# Patient Record
Sex: Male | Born: 1960
Health system: Southern US, Community
[De-identification: ages and names within clinical notes are randomized; demographics above are authoritative.]

## PROBLEM LIST (undated history)

## (undated) DIAGNOSIS — I509 Heart failure, unspecified: Secondary | ICD-10-CM

## (undated) DIAGNOSIS — J449 Chronic obstructive pulmonary disease, unspecified: Secondary | ICD-10-CM

## (undated) DIAGNOSIS — I499 Cardiac arrhythmia, unspecified: Secondary | ICD-10-CM

## (undated) DIAGNOSIS — I219 Acute myocardial infarction, unspecified: Secondary | ICD-10-CM

## (undated) DIAGNOSIS — F419 Anxiety disorder, unspecified: Secondary | ICD-10-CM

## (undated) DIAGNOSIS — E119 Type 2 diabetes mellitus without complications: Secondary | ICD-10-CM

## (undated) DIAGNOSIS — G4730 Sleep apnea, unspecified: Secondary | ICD-10-CM

## (undated) DIAGNOSIS — I639 Cerebral infarction, unspecified: Secondary | ICD-10-CM

## (undated) DIAGNOSIS — I4891 Unspecified atrial fibrillation: Secondary | ICD-10-CM

## (undated) DIAGNOSIS — I10 Essential (primary) hypertension: Secondary | ICD-10-CM

## (undated) DIAGNOSIS — N189 Chronic kidney disease, unspecified: Secondary | ICD-10-CM

## (undated) DIAGNOSIS — M1990 Unspecified osteoarthritis, unspecified site: Secondary | ICD-10-CM

## (undated) HISTORY — DX: Unspecified atrial fibrillation: I48.91

## (undated) HISTORY — PX: ABLATION: SHX5711

## (undated) HISTORY — DX: Acute myocardial infarction, unspecified: I21.9

## (undated) HISTORY — DX: Chronic kidney disease, unspecified: N18.9

## (undated) HISTORY — DX: Anxiety disorder, unspecified: F41.9

## (undated) HISTORY — DX: Type 2 diabetes mellitus without complications: E11.9

## (undated) HISTORY — PX: EYE SURGERY: SHX253

## (undated) HISTORY — DX: Unspecified osteoarthritis, unspecified site: M19.90

---

## 1998-04-30 ENCOUNTER — Emergency Department (HOSPITAL_COMMUNITY): Admission: EM | Admit: 1998-04-30 | Discharge: 1998-04-30 | Payer: Self-pay | Admitting: Emergency Medicine

## 2008-08-10 ENCOUNTER — Emergency Department (HOSPITAL_COMMUNITY): Admission: EM | Admit: 2008-08-10 | Discharge: 2008-08-10 | Payer: Self-pay | Admitting: Emergency Medicine

## 2008-09-13 ENCOUNTER — Emergency Department (HOSPITAL_COMMUNITY): Admission: EM | Admit: 2008-09-13 | Discharge: 2008-09-14 | Payer: Self-pay | Admitting: Emergency Medicine

## 2009-12-28 IMAGING — CT CT NECK W/ CM
3 series · 13 of 20 positions shown, 14 images · IV contrast (omnipaque)
Comparison: None

CLINICAL DATA: Abscess, facial swelling

CT NECK WITH CONTRAST
TECHNIQUE: Multidetector CT imaging of the neck was performed with
intravenous contrast.
Contrast: 80 ml Omnipaque 300 base the brain appears normal.
Orbits appear normal.

[Series 2: 2cc/(id)/1cc/(id) · axial · 0.52mm/px · z∈[-231,-108]mm · 3 of 67 slices shown, 4 images]
[im 17/67  soft-tissue]
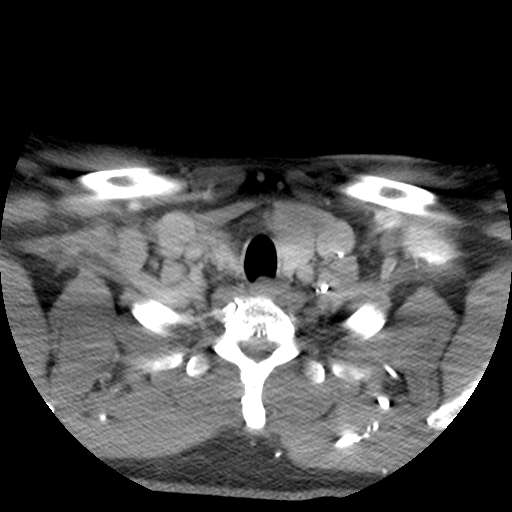
[im 17/67  bone]
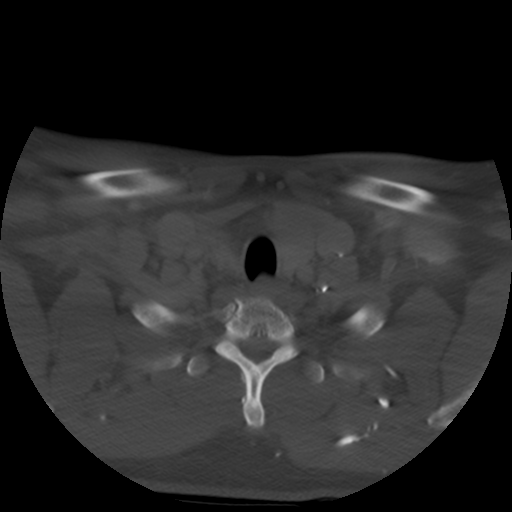
[im 34/67  bone]
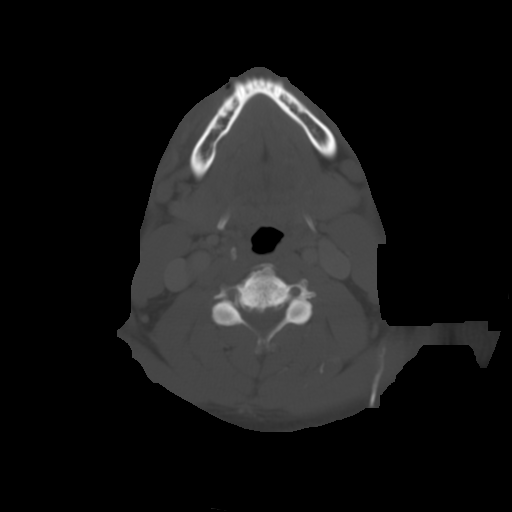
[im 50/67  bone]
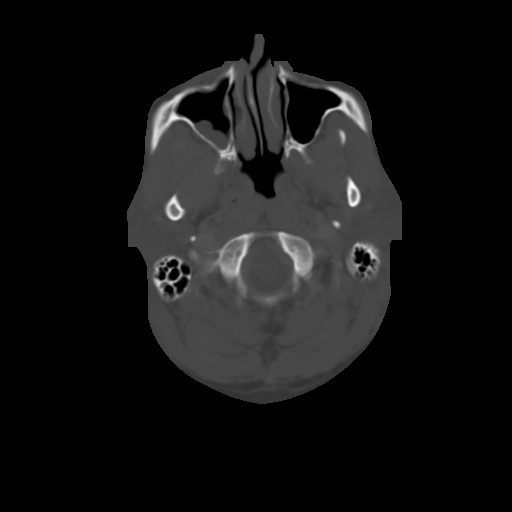

[Series 300: reformatted · sagittal · 0.52mm/px · 7 of 126 slices shown (1 of 2)]
[im 16/126  bone]
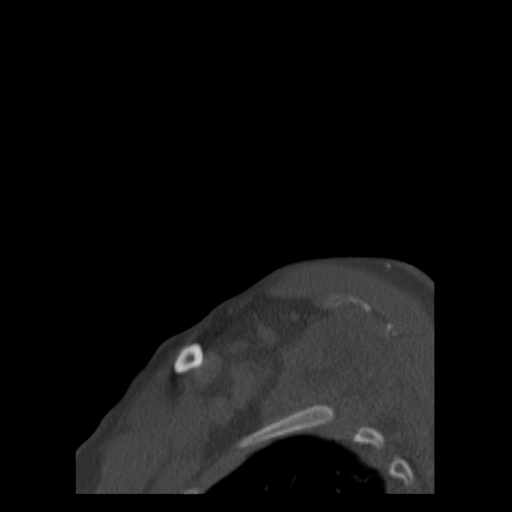
[im 32/126  bone]
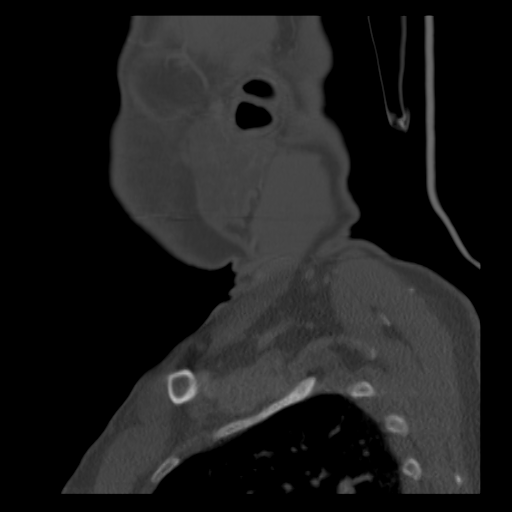
[im 47/126  bone]
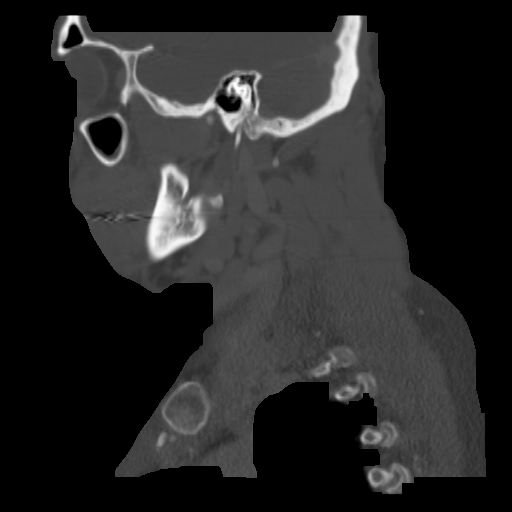
[im 63/126  bone]
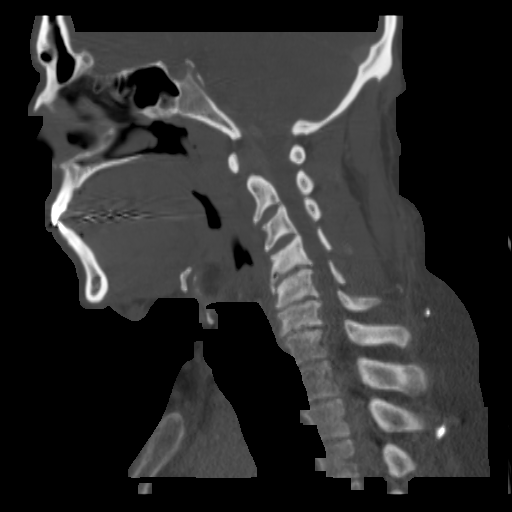
[im 79/126  bone]
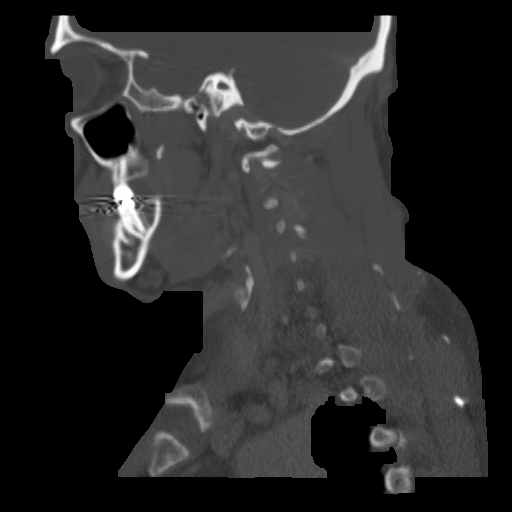
[im 94/126  bone]
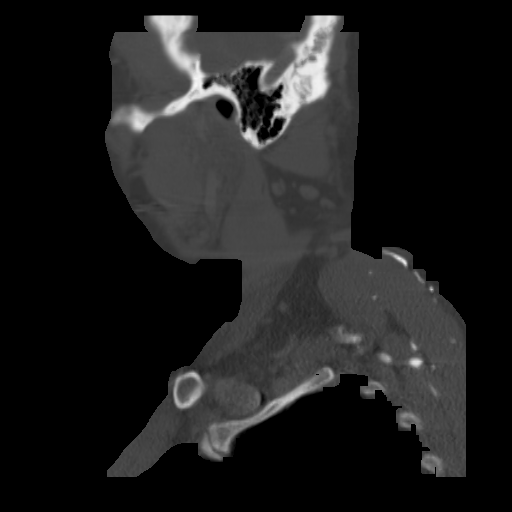
[im 110/126  bone]
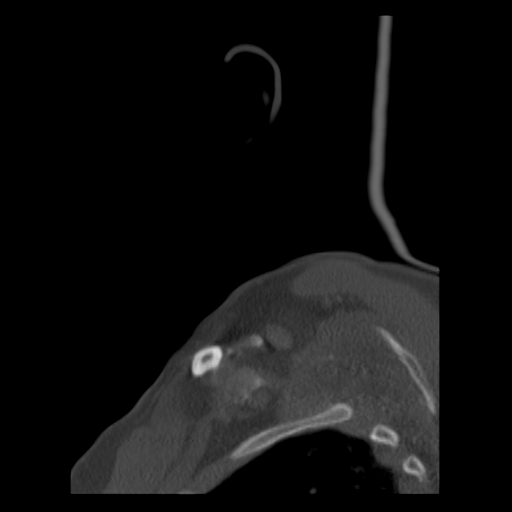

[Series 301: reformatted · coronal · 0.52mm/px · 3 of 128 slices shown (2 of 2)]
[im 26/128  bone]
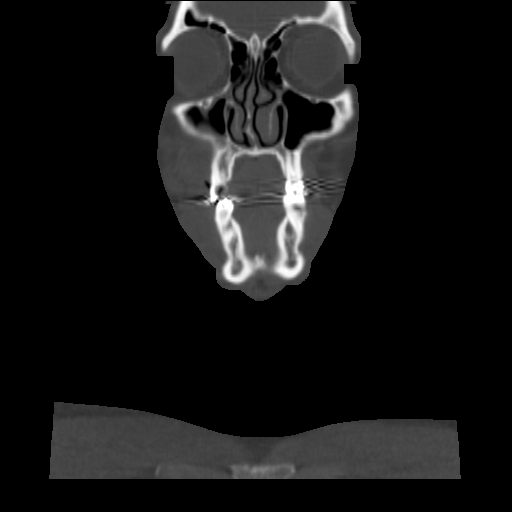
[im 51/128  bone]
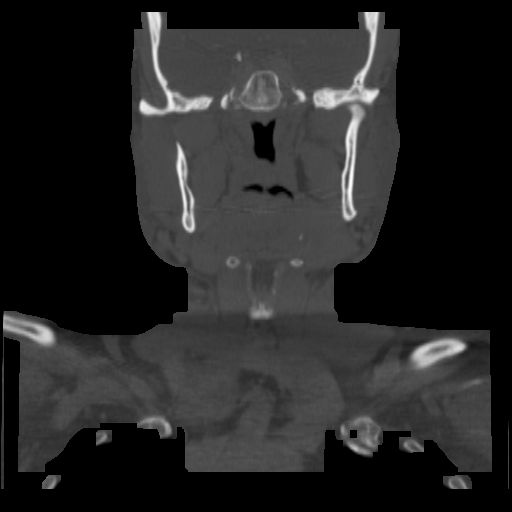
[im 77/128  bone]
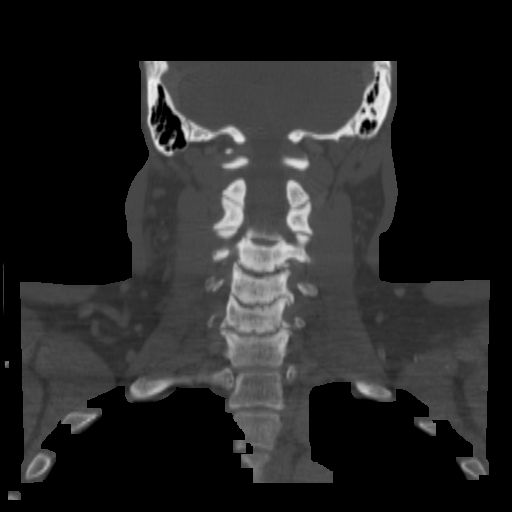

[13 of 20 positions shown; findings below may reference images not displayed]

FINDINGS: There are peri apical lucencies involving upper right 3rd
tooth and 4th tooth ( image 29).  The second tooth  is missing.
There is mucosal thickening in the floor of the right maxillary
sinus in the same vicinity.  There is a small inflammatory
reaction/fluid collection just anterior to this maxillary process
within the soft tissues of the right cheek (image 24).  The orbits
appear normal.  The pharyngeal mucosa appears normal and symmetric.
The retropharyngeal soft tissues appear normal.  No significant
lymphadenopathy.  There are mildly enlarged submental nodes noted.
IMPRESSION: 1.  Peri apical abscess involving the right 3rd tooth and
potentially 4th tooth
2.  Small soft tissue abscess  versus phlegmon within the soft
tissues anterior to the right maxillary bone.

## 2010-05-03 LAB — POCT I-STAT, CHEM 8
Calcium, Ion: 1.07 mmol/L — ABNORMAL LOW (ref 1.12–1.32)
Glucose, Bld: 100 mg/dL — ABNORMAL HIGH (ref 70–99)
HCT: 45 % (ref 39.0–52.0)
Hemoglobin: 15.3 g/dL (ref 13.0–17.0)

## 2010-05-04 LAB — WOUND CULTURE
Culture: NO GROWTH
Gram Stain: NONE SEEN

## 2010-05-04 LAB — GLUCOSE, CAPILLARY: Glucose-Capillary: 88 mg/dL (ref 70–99)

## 2012-10-20 ENCOUNTER — Encounter (HOSPITAL_COMMUNITY): Admission: EM | Disposition: A | Discharge status: Home / Self Care | Payer: Self-pay | Source: Home / Self Care

## 2012-10-20 ENCOUNTER — Emergency Department (HOSPITAL_COMMUNITY): Payer: Self-pay | Admitting: Anesthesiology

## 2012-10-20 ENCOUNTER — Emergency Department (HOSPITAL_COMMUNITY): Payer: Self-pay

## 2012-10-20 ENCOUNTER — Encounter (HOSPITAL_COMMUNITY): Payer: Self-pay | Admitting: Emergency Medicine

## 2012-10-20 ENCOUNTER — Inpatient Hospital Stay (HOSPITAL_COMMUNITY)
Admission: EM | Admit: 2012-10-20 | Discharge: 2012-10-22 | DRG: 983 | Disposition: A | Discharge status: Home / Self Care | Payer: Self-pay | Attending: General Surgery | Admitting: General Surgery

## 2012-10-20 ENCOUNTER — Encounter (HOSPITAL_COMMUNITY): Payer: Self-pay | Admitting: Anesthesiology

## 2012-10-20 DIAGNOSIS — K611 Rectal abscess: Secondary | ICD-10-CM | POA: Diagnosis present

## 2012-10-20 DIAGNOSIS — K612 Anorectal abscess: Secondary | ICD-10-CM

## 2012-10-20 DIAGNOSIS — F17200 Nicotine dependence, unspecified, uncomplicated: Secondary | ICD-10-CM | POA: Diagnosis present

## 2012-10-20 DIAGNOSIS — L0390 Cellulitis, unspecified: Secondary | ICD-10-CM

## 2012-10-20 HISTORY — PX: INCISION AND DRAINAGE PERIRECTAL ABSCESS: SHX1804

## 2012-10-20 LAB — CBC WITH DIFFERENTIAL/PLATELET
Basophils Relative: 0 % (ref 0–1)
HCT: 44.5 % (ref 39.0–52.0)
Hemoglobin: 15.7 g/dL (ref 13.0–17.0)
Lymphocytes Relative: 9 % — ABNORMAL LOW (ref 12–46)
Lymphs Abs: 1.3 10*3/uL (ref 0.7–4.0)
Monocytes Relative: 9 % (ref 3–12)
Neutro Abs: 10.9 10*3/uL — ABNORMAL HIGH (ref 1.7–7.7)
Neutrophils Relative %: 81 % — ABNORMAL HIGH (ref 43–77)
RBC: 5.27 MIL/uL (ref 4.22–5.81)
WBC: 13.6 10*3/uL — ABNORMAL HIGH (ref 4.0–10.5)

## 2012-10-20 LAB — URINALYSIS, ROUTINE W REFLEX MICROSCOPIC
Glucose, UA: NEGATIVE mg/dL
Nitrite: NEGATIVE
Specific Gravity, Urine: 1.016 (ref 1.005–1.030)
pH: 6 (ref 5.0–8.0)

## 2012-10-20 LAB — URINE MICROSCOPIC-ADD ON

## 2012-10-20 LAB — BASIC METABOLIC PANEL
BUN: 6 mg/dL (ref 6–23)
CO2: 26 mEq/L (ref 19–32)
Chloride: 97 mEq/L (ref 96–112)
GFR calc Af Amer: >90 mL/min (ref >90)
Potassium: 3.7 mEq/L (ref 3.5–5.1)

## 2012-10-20 SURGERY — INCISION AND DRAINAGE, ABSCESS, PERIRECTAL
Anesthesia: General | Site: Buttocks | Wound class: Dirty or Infected

## 2012-10-20 MED ORDER — IOHEXOL 300 MG/ML  SOLN
25.0000 mL | INTRAMUSCULAR | Status: AC
  Administered 2012-10-20 (×2): 25 mL via ORAL

## 2012-10-20 MED ORDER — HYDROMORPHONE HCL PF 1 MG/ML IJ SOLN
1.0000 mg | INTRAMUSCULAR | Status: DC | PRN
  Administered 2012-10-21: 1 mg via INTRAVENOUS
  Filled 2012-10-20: qty 1

## 2012-10-20 MED ORDER — FENTANYL CITRATE 0.05 MG/ML IJ SOLN
INTRAMUSCULAR | Status: DC | PRN
  Administered 2012-10-20 (×2): 100 ug via INTRAVENOUS
  Administered 2012-10-20: 50 ug via INTRAVENOUS

## 2012-10-20 MED ORDER — ONDANSETRON HCL 4 MG/2ML IJ SOLN
4.0000 mg | Freq: Once | INTRAMUSCULAR | Status: AC
  Administered 2012-10-20: 4 mg via INTRAVENOUS
  Filled 2012-10-20: qty 2

## 2012-10-20 MED ORDER — LACTATED RINGERS IV SOLN
INTRAVENOUS | Status: DC | PRN
  Administered 2012-10-20: 21:00:00 via INTRAVENOUS

## 2012-10-20 MED ORDER — OXYCODONE-ACETAMINOPHEN 5-325 MG PO TABS
1.0000 | ORAL_TABLET | ORAL | Status: DC | PRN
  Administered 2012-10-21 – 2012-10-22 (×5): 2 via ORAL
  Filled 2012-10-20 (×5): qty 2
  Filled 2012-10-20: qty 1

## 2012-10-20 MED ORDER — PROPOFOL 10 MG/ML IV BOLUS
INTRAVENOUS | Status: DC | PRN
  Administered 2012-10-20: 200 mg via INTRAVENOUS

## 2012-10-20 MED ORDER — SUCCINYLCHOLINE CHLORIDE 20 MG/ML IJ SOLN
INTRAMUSCULAR | Status: DC | PRN
  Administered 2012-10-20: 180 mg via INTRAVENOUS

## 2012-10-20 MED ORDER — POTASSIUM CHLORIDE IN NACL 20-0.9 MEQ/L-% IV SOLN
INTRAVENOUS | Status: DC
  Administered 2012-10-21: via INTRAVENOUS
  Filled 2012-10-20 (×3): qty 1000

## 2012-10-20 MED ORDER — CLINDAMYCIN PHOSPHATE 900 MG/50ML IV SOLN
900.0000 mg | Freq: Once | INTRAVENOUS | Status: AC
  Administered 2012-10-20: 900 mg via INTRAVENOUS
  Filled 2012-10-20: qty 50

## 2012-10-20 MED ORDER — ONDANSETRON HCL 4 MG/2ML IJ SOLN: 4.0000 mg | Freq: Four times a day (QID) | INTRAMUSCULAR | Status: DC | PRN

## 2012-10-20 MED ORDER — 0.9 % SODIUM CHLORIDE (POUR BTL) OPTIME
TOPICAL | Status: DC | PRN
  Administered 2012-10-20: 1000 mL

## 2012-10-20 MED ORDER — OXYCODONE HCL 5 MG PO TABS: 5.0000 mg | ORAL_TABLET | Freq: Once | ORAL | Status: DC | PRN

## 2012-10-20 MED ORDER — IOHEXOL 300 MG/ML  SOLN
80.0000 mL | Freq: Once | INTRAMUSCULAR | Status: AC | PRN
  Administered 2012-10-20: 80 mL via INTRAVENOUS

## 2012-10-20 MED ORDER — MIDAZOLAM HCL 5 MG/5ML IJ SOLN
INTRAMUSCULAR | Status: DC | PRN
  Administered 2012-10-20: 2 mg via INTRAVENOUS

## 2012-10-20 MED ORDER — SODIUM CHLORIDE 0.9 % IV SOLN
3.0000 g | Freq: Four times a day (QID) | INTRAVENOUS | Status: DC
  Administered 2012-10-21 – 2012-10-22 (×5): 3 g via INTRAVENOUS
  Filled 2012-10-20 (×7): qty 3

## 2012-10-20 MED ORDER — HYDROMORPHONE HCL PF 1 MG/ML IJ SOLN
INTRAMUSCULAR | Status: AC
  Filled 2012-10-20: qty 1

## 2012-10-20 MED ORDER — OXYCODONE-ACETAMINOPHEN 5-325 MG PO TABS
ORAL_TABLET | ORAL | Status: AC
  Filled 2012-10-20: qty 2

## 2012-10-20 MED ORDER — OXYCODONE HCL 5 MG/5ML PO SOLN: 5.0000 mg | Freq: Once | ORAL | Status: DC | PRN

## 2012-10-20 MED ORDER — ONDANSETRON HCL 4 MG PO TABS: 4.0000 mg | ORAL_TABLET | Freq: Four times a day (QID) | ORAL | Status: DC | PRN

## 2012-10-20 MED ORDER — PROMETHAZINE HCL 25 MG/ML IJ SOLN: 6.2500 mg | INTRAMUSCULAR | Status: DC | PRN

## 2012-10-20 MED ORDER — HYDROMORPHONE HCL PF 1 MG/ML IJ SOLN
0.2500 mg | INTRAMUSCULAR | Status: DC | PRN
  Administered 2012-10-20 (×2): 0.5 mg via INTRAVENOUS

## 2012-10-20 MED ORDER — LIDOCAINE HCL (CARDIAC) 20 MG/ML IV SOLN
INTRAVENOUS | Status: DC | PRN
  Administered 2012-10-20: 100 mg via INTRAVENOUS

## 2012-10-20 MED ORDER — MORPHINE SULFATE 4 MG/ML IJ SOLN
4.0000 mg | Freq: Once | INTRAMUSCULAR | Status: AC
  Administered 2012-10-20: 4 mg via INTRAVENOUS
  Filled 2012-10-20: qty 1

## 2012-10-20 MED ORDER — ENOXAPARIN SODIUM 40 MG/0.4ML ~~LOC~~ SOLN
40.0000 mg | SUBCUTANEOUS | Status: DC
  Administered 2012-10-21: 40 mg via SUBCUTANEOUS
  Filled 2012-10-20 (×2): qty 0.4

## 2012-10-20 SURGICAL SUPPLY — 31 items
BANDAGE GAUZE ELAST BULKY 4 IN (GAUZE/BANDAGES/DRESSINGS) IMPLANT
BLADE SURG 10 STRL SS (BLADE) ×2 IMPLANT
BLADE SURG 15 STRL LF DISP TIS (BLADE) ×1 IMPLANT
BLADE SURG 15 STRL SS (BLADE) ×2
CLOTH BEACON ORANGE TIMEOUT ST (SAFETY) ×2 IMPLANT
COVER MAYO STAND STRL (DRAPES) ×2 IMPLANT
COVER SURGICAL LIGHT HANDLE (MISCELLANEOUS) ×2 IMPLANT
DRAPE UTILITY 15X26 W/TAPE STR (DRAPE) ×4 IMPLANT
DRSG PAD ABDOMINAL 8X10 ST (GAUZE/BANDAGES/DRESSINGS) ×1 IMPLANT
ELECT CAUTERY BLADE 6.4 (BLADE) ×2 IMPLANT
ELECT REM PT RETURN 9FT ADLT (ELECTROSURGICAL) ×2
ELECTRODE REM PT RTRN 9FT ADLT (ELECTROSURGICAL) ×1 IMPLANT
GLOVE BIO SURGEON STRL SZ7 (GLOVE) ×2 IMPLANT
GLOVE BIOGEL PI IND STRL 7.5 (GLOVE) ×1 IMPLANT
GLOVE BIOGEL PI INDICATOR 7.5 (GLOVE) ×1
GOWN STRL NON-REIN LRG LVL3 (GOWN DISPOSABLE) ×4 IMPLANT
KIT BASIN OR (CUSTOM PROCEDURE TRAY) ×2 IMPLANT
KIT ROOM TURNOVER OR (KITS) ×2 IMPLANT
NS IRRIG 1000ML POUR BTL (IV SOLUTION) ×2 IMPLANT
PACK LITHOTOMY IV (CUSTOM PROCEDURE TRAY) ×2 IMPLANT
PAD ARMBOARD 7.5X6 YLW CONV (MISCELLANEOUS) ×4 IMPLANT
PENCIL BUTTON HOLSTER BLD 10FT (ELECTRODE) ×2 IMPLANT
SPONGE GAUZE 4X4 12PLY (GAUZE/BANDAGES/DRESSINGS) ×1 IMPLANT
SPONGE LAP 18X18 X RAY DECT (DISPOSABLE) ×2 IMPLANT
SURGILUBE 2OZ TUBE FLIPTOP (MISCELLANEOUS) IMPLANT
SYR BULB 3OZ (MISCELLANEOUS) ×2 IMPLANT
TOWEL OR 17X24 6PK STRL BLUE (TOWEL DISPOSABLE) ×2 IMPLANT
TOWEL OR 17X26 10 PK STRL BLUE (TOWEL DISPOSABLE) ×2 IMPLANT
TUBE CONNECTING 12X1/4 (SUCTIONS) ×2 IMPLANT
WATER STERILE IRR 1000ML POUR (IV SOLUTION) IMPLANT
YANKAUER SUCT BULB TIP NO VENT (SUCTIONS) ×2 IMPLANT

## 2012-10-20 NOTE — ED Notes (Signed)
Surgery consult at bedside.

## 2012-10-20 NOTE — Op Note (Signed)
Pre-op Diagnosis:  Right ischiorectal abscess Post-op Diagnosis:  Same Procedure:  Incision and debridement of right perirectal abscess - skin, subcutaneous fat, muscle -20 cm2 Surgeon:  Wynona Luna. Anesthesia:  GETT Indications:  52 yo male presents with 5 day history of worsening R buttock pain and swelling.  Found to have large right perirectal abscess with extension toward the base of the scrotum.  Description of procedure:  The patient was brought to the OR and placed in a supine position on the table.  After an adequate level of anesthesia was given, The patient's legs were placed in lithotomy position in yellowfin stirrups. His perineum was prepped with Betadine and draped in sterile fashion. A timeout was taken to ensure the proper patient proper procedure.  Cautery was used to make a round incision in the center of the most fluctuant area. Immediately we encountered some foul-smelling purulent fluid. This was cultured and sent for microbiology. The suction was placed in the abscess cavity and about 75 cc of purulent fluid was expressed. I extended my debridement anteriorly. The abscess seemed to tract towards the base of the scrotum. We excised a total of about 20 cm of skin subcutaneous fat and a little bit of muscle. We thoroughly explored the wound and there did not seem to be any further tunneling. There was no obvious communication to the rectum on digital rectal examination. Pulse lavage was used to irrigated the wound using 2-1/2 L of saline. Hemostasis was obtained with cautery. We then packed the wound with saline moistened gauze. A dry dressing was applied. The patient was then extubated and brought to recovery in stable condition. All sponge, initially, and needle counts are correct.  Wilmon Arms. Corliss Skains, MD, [facility]  General/ Trauma Surgery  10/20/2012 9:38 PM

## 2012-10-20 NOTE — ED Provider Notes (Signed)
CSN: [id number]     Arrival date & time 10/20/12  1124 History   First MD Initiated Contact with Patient 10/20/12 1243     Chief Complaint  Patient presents with  . Rectal Pain  . Abscess   (Consider location/radiation/quality/duration/timing/severity/associated sxs/prior Treatment) HPI Comments: Patient is a 52 year old male with no past medical history who presents with a 5 day history of rectal pain. Symptoms started gradually and progressively worsened. The pain is throbbing and severe with radiation into his scrotum. Patient has not tried anything for symptoms. Patient reports associated redness and swelling. Palpation of the area and sitting makes the pain worse. Nothing makes the pain better. No other associated symptoms.    History reviewed. No pertinent past medical history. History reviewed. No pertinent past surgical history. History reviewed. No pertinent family history. History  Substance Use Topics  . Smoking status: Current Every Day Smoker  . Smokeless tobacco: Not on file  . Alcohol Use: Yes    Review of Systems  Gastrointestinal: Positive for rectal pain.  All other systems reviewed and are negative.    Allergies  Review of patient's allergies indicates no known allergies.  Home Medications  No current outpatient prescriptions on file. BP 151/117  Pulse 109  Temp(Src) 99.3 F (37.4 C) (Oral)  Resp 18  SpO2 97% Physical Exam  Nursing note and vitals reviewed. Constitutional: He is oriented to person, place, and time. He appears well-developed and well-nourished. No distress.  HENT:  Head: Normocephalic and atraumatic.  Eyes: Conjunctivae and EOM are normal.  Neck: Normal range of motion.  Cardiovascular: Normal rate and regular rhythm.  Exam reveals no gallop and no friction rub.   No murmur heard. Pulmonary/Chest: Effort normal and breath sounds normal. He has no wheezes. He has no rales. He exhibits no tenderness.  Abdominal: Soft. He exhibits no  distension. There is no tenderness. There is no rebound and no guarding.  Genitourinary:  Right perirectal and buttock tenderness and induration that extends about 12 cm into the rectum and scrotum. No area of fluctuance noted.   Musculoskeletal: Normal range of motion.  Neurological: He is alert and oriented to person, place, and time. Coordination normal.  Speech is goal-oriented. Moves limbs without ataxia.   Skin: Skin is warm and dry.  Psychiatric: He has a normal mood and affect. His behavior is normal.    ED Course  Procedures (including critical care time) Labs Review Labs Reviewed  CBC WITH DIFFERENTIAL - Abnormal; Notable for the following:    WBC 13.6 (*)    Neutrophils Relative % 81 (*)    Neutro Abs 10.9 (*)    Lymphocytes Relative 9 (*)    Monocytes Absolute 1.2 (*)    All other components within normal limits  BASIC METABOLIC PANEL - Abnormal; Notable for the following:    Sodium 134 (*)    Glucose, Bld 117 (*)    GFR calc non Af Amer 79 (*)    All other components within normal limits  URINALYSIS, ROUTINE W REFLEX MICROSCOPIC - Abnormal; Notable for the following:    Color, Urine AMBER (*)    Hgb urine dipstick TRACE (*)    Bilirubin Urine SMALL (*)    Ketones, ur 15 (*)    Leukocytes, UA TRACE (*)    All other components within normal limits  URINE CULTURE  URINE MICROSCOPIC-ADD ON   Imaging Review Ct Pelvis W Contrast  10/20/2012   CLINICAL DATA:  Perianal/ perineal swelling  with tenderness. Question perirectal abscess.  EXAM: CT PELVIS WITH CONTRAST  TECHNIQUE: Multidetector CT imaging of the pelvis was performed using the standard protocol following the bolus administration of intravenous contrast.  CONTRAST:  80mL OMNIPAQUE IOHEXOL 300 MG/ML  SOLN  COMPARISON:  None.  FINDINGS: There is asymmetric soft tissue swelling throughout the right perianal subcutaneous fat. There is an ill-defined fluid collection measuring approximately 6.0 x 2.5 cm on axial image  63. This is very superficial and demonstrates no internal air or definable fistulous track communicating with the anus. Inflammatory changes are limited to the right side of the anal crease. Within the pelvis, no perirectal inflammatory changes are identified.  There is mild sigmoid colon diverticulosis. Mildly prominent inguinal lymph nodes bilaterally are likely reactive. The urinary bladder and prostate gland appear unremarkable for the degree of inspiration. There is prominent fat within both inguinal canals.  Lower lumbar spine facet degenerative changes are noted. There are no worrisome osseous findings.  IMPRESSION: 1. Findings are consistent with a right perianal superficial abscess with surrounding cellulitis as described. No fistulous tract identified. 2. No intrapelvic inflammatory changes identified. 3. Probable reactive inguinal lymphadenopathy. 4. Bilateral inguinal hernias containing fat.   Electronically Signed   By: Roxy Horseman   On: 10/20/2012 17:20    MDM   1. Perirectal abscess   2. Cellulitis     2:59 PM Labs show elevated WBC. Other labs and urinalysis unremarkable for acute changes. Patient is tachycardic with a low grade temperature. Patient will have CT pelvis with IV contrast to rule out deep abscess.   CT shows superficial abscess, however, on exam I do not see and identifiable are of fluctuance. Dr. Fayrene Fearing saw the patient and agrees. I spoke with Dr. Corliss Skains of general surgery who will see the patient and drain the abscess in the OR. Patient started on IV Clindamycin.    Emilia Beck, PA-C 10/20/12 2009

## 2012-10-20 NOTE — ED Notes (Signed)
Pt informed for consult.

## 2012-10-20 NOTE — Anesthesia Postprocedure Evaluation (Signed)
  Anesthesia Post-op Note  Patient: Donald [name]  Procedure(s) Performed: Procedure(s) with comments: IRRIGATION AND DEBRIDEMENT PERIRECTAL ABSCESS (N/A) - Lithotomy  Patient Location: PACU  Anesthesia Type:General  Level of Consciousness: awake  Airway and Oxygen Therapy: Patient Spontanous Breathing  Post-op Pain: mild  Post-op Assessment: Post-op Vital signs reviewed, Patient's Cardiovascular Status Stable, Respiratory Function Stable, Patent Airway, No signs of Nausea or vomiting and Pain level controlled  Post-op Vital Signs: Reviewed and stable  Complications: No apparent anesthesia complications

## 2012-10-20 NOTE — Transfer of Care (Signed)
Immediate Anesthesia Transfer of Care Note  Patient: Donald [name]  Procedure(s) Performed: Procedure(s) with comments: IRRIGATION AND DEBRIDEMENT PERIRECTAL ABSCESS (N/A) - Lithotomy  Patient Location: PACU  Anesthesia Type:General  Level of Consciousness: awake, alert  and oriented  Airway & Oxygen Therapy: Patient Spontanous Breathing  Post-op Assessment: Report given to PACU RN and Post -op Vital signs reviewed and stable  Post vital signs: Reviewed and stable  Complications: No apparent anesthesia complications

## 2012-10-20 NOTE — ED Provider Notes (Signed)
Medical screening examination/treatment/procedure(s) were conducted as a shared visit with non-physician practitioner(s) and myself.  I personally evaluated the patient during the encounter   Patient seen and examined. Large abscess and area of cellulitis . Extends to the posterior scrotum. No signs of abnormalities the scrotal skin that would suggest fornier's.  Indurated.  Some skin breakdown over area of maximum induration. I have recommended surgical consultation.   Care discussed between PA and Dr. Corliss Skains.  Dr. Corliss Skains here and planning OR debriedment and admit for additional IV antibiotics.   Roney Marion, MD 10/20/12 [phone]

## 2012-10-20 NOTE — H&P (Signed)
Donald [name] is an 52 y.o. male.   Chief Complaint: Right buttock pain and swelling HPI: Healthy 52 yo male presents with a five day history of progressively worsening swelling and tenderness in his right perirectal region.  No fever.  The pain and swelling seem to have progressed and now radiates into the base of his scrotum.  He presented to the ED today around 10:00 AM for evaluation.  We were called to see the patient around 6:45 PM.  History reviewed. No pertinent past medical history.  History reviewed. No pertinent past surgical history. Orbital reconstructive surgery.  History reviewed. No pertinent family history. Social History:  reports that he has been smoking.  He does not have any smokeless tobacco history on file. He reports that  drinks alcohol. He reports that he uses illicit drugs (Cocaine).  Allergies: No Known Allergies  Prior to Admission medications   Medication Sig Start Date End Date Taking? Authorizing Provider  Multiple Vitamin (MULTIVITAMIN WITH MINERALS) TABS tablet Take 1 tablet by mouth daily.   Yes Historical Provider, MD  naproxen sodium (ANAPROX) 220 MG tablet Take 220-440 mg by mouth daily as needed (pain).   Yes Historical Provider, MD  neomycin-polymyxin-pramoxine (NEOSPORIN PLUS) 1 % cream Apply 1 application topically 3 (three) times daily as needed (wound care).   Yes Historical Provider, MD     Results for orders placed during the hospital encounter of 10/20/12 (from the past 48 hour(s))  CBC WITH DIFFERENTIAL     Status: Abnormal   Collection Time    10/20/12  1:02 PM      Result Value Range   WBC 13.6 (*) 4.0 - 10.5 K/uL   RBC 5.27  4.22 - 5.81 MIL/uL   Hemoglobin 15.7  13.0 - 17.0 g/dL   HCT 40.9  81.1 - 91.4 %   MCV 84.4  78.0 - 100.0 fL   MCH 29.8  26.0 - 34.0 pg   MCHC 35.3  30.0 - 36.0 g/dL   RDW 78.2  95.6 - 21.3 %   Platelets 189  150 - 400 K/uL   Neutrophils Relative % 81 (*) 43 - 77 %   Neutro Abs 10.9 (*) 1.7 - 7.7 K/uL   Lymphocytes Relative 9 (*) 12 - 46 %   Lymphs Abs 1.3  0.7 - 4.0 K/uL   Monocytes Relative 9  3 - 12 %   Monocytes Absolute 1.2 (*) 0.1 - 1.0 K/uL   Eosinophils Relative 1  0 - 5 %   Eosinophils Absolute 0.1  0.0 - 0.7 K/uL   Basophils Relative 0  0 - 1 %   Basophils Absolute 0.0  0.0 - 0.1 K/uL  BASIC METABOLIC PANEL     Status: Abnormal   Collection Time    10/20/12  1:02 PM      Result Value Range   Sodium 134 (*) 135 - 145 mEq/L   Potassium 3.7  3.5 - 5.1 mEq/L   Chloride 97  96 - 112 mEq/L   CO2 26  19 - 32 mEq/L   Glucose, Bld 117 (*) 70 - 99 mg/dL   BUN 6  6 - 23 mg/dL   Creatinine, Ser 0.86  0.50 - 1.35 mg/dL   Calcium 9.3  8.4 - 57.8 mg/dL   GFR calc non Af Amer 79 (*) >90 mL/min   GFR calc Af Amer >90  >90 mL/min   Comment: (NOTE)     The eGFR has been calculated using the  CKD EPI equation.     This calculation has not been validated in all clinical situations.     eGFR's persistently <90 mL/min signify possible Chronic Kidney     Disease.  URINALYSIS, ROUTINE W REFLEX MICROSCOPIC     Status: Abnormal   Collection Time    10/20/12  1:40 PM      Result Value Range   Color, Urine AMBER (*) YELLOW   Comment: BIOCHEMICALS MAY BE AFFECTED BY COLOR   APPearance CLEAR  CLEAR   Specific Gravity, Urine 1.016  1.005 - 1.030   pH 6.0  5.0 - 8.0   Glucose, UA NEGATIVE  NEGATIVE mg/dL   Hgb urine dipstick TRACE (*) NEGATIVE   Bilirubin Urine SMALL (*) NEGATIVE   Ketones, ur 15 (*) NEGATIVE mg/dL   Protein, ur NEGATIVE  NEGATIVE mg/dL   Urobilinogen, UA 1.0  0.0 - 1.0 mg/dL   Nitrite NEGATIVE  NEGATIVE   Leukocytes, UA TRACE (*) NEGATIVE  URINE MICROSCOPIC-ADD ON     Status: None   Collection Time    10/20/12  1:40 PM      Result Value Range   Squamous Epithelial / LPF RARE  RARE   WBC, UA 0-2  <3 WBC/hpf   RBC / HPF 3-6  <3 RBC/hpf   Bacteria, UA RARE  RARE   Urine-Other MUCOUS PRESENT     Ct Pelvis W Contrast  10/20/2012   CLINICAL DATA:  Perianal/ perineal  swelling with tenderness. Question perirectal abscess.  EXAM: CT PELVIS WITH CONTRAST  TECHNIQUE: Multidetector CT imaging of the pelvis was performed using the standard protocol following the bolus administration of intravenous contrast.  CONTRAST:  80mL OMNIPAQUE IOHEXOL 300 MG/ML  SOLN  COMPARISON:  None.  FINDINGS: There is asymmetric soft tissue swelling throughout the right perianal subcutaneous fat. There is an ill-defined fluid collection measuring approximately 6.0 x 2.5 cm on axial image 63. This is very superficial and demonstrates no internal air or definable fistulous track communicating with the anus. Inflammatory changes are limited to the right side of the anal crease. Within the pelvis, no perirectal inflammatory changes are identified.  There is mild sigmoid colon diverticulosis. Mildly prominent inguinal lymph nodes bilaterally are likely reactive. The urinary bladder and prostate gland appear unremarkable for the degree of inspiration. There is prominent fat within both inguinal canals.  Lower lumbar spine facet degenerative changes are noted. There are no worrisome osseous findings.  IMPRESSION: 1. Findings are consistent with a right perianal superficial abscess with surrounding cellulitis as described. No fistulous tract identified. 2. No intrapelvic inflammatory changes identified. 3. Probable reactive inguinal lymphadenopathy. 4. Bilateral inguinal hernias containing fat.   Electronically Signed   By: Roxy Horseman   On: 10/20/2012 17:20    ROS  Blood pressure 150/100, pulse 98, temperature 100.5 F (38.1 C), temperature source Oral, resp. rate 20, SpO2 95.00%. Physical Exam  WDWN - uncomfortable HEENT:  EOMI, sclera anicteric Neck:  No masses, no thyromegaly Lungs:  CTA bilaterally; normal respiratory effort CV:  Regular rate and rhythm; no murmurs Abd:  +bowel sounds, soft, non-tender, no masses GU:  Bilateral descended testes; no scrotal induration or tenderness Perineum -  right side is mildly indurated extending back towards the anus with progressively worse induration and tenderness; some skin breakdown over the area of the greatest fluctuance Ext:  Well-perfused; no edema Skin:  Warm, dry; no sign of jaundice  Assessment/Plan Large perirectal abscess on the right side  To OR for  drainage of perirectal abscess.  The surgical procedure has been discussed with the patient.  Potential risks, benefits, alternative treatments, and expected outcomes have been explained.  All of the patient's questions at this time have been answered.  The likelihood of reaching the patient's treatment goal is good.  The patient understand the proposed surgical procedure and wishes to proceed.   Suhan Paci K. 10/20/2012, 7:20 PM

## 2012-10-20 NOTE — ED Notes (Signed)
Pt c/o rectal abscess and increased pain and swelling x 1 week

## 2012-10-20 NOTE — Anesthesia Preprocedure Evaluation (Addendum)
Anesthesia Evaluation  Patient identified by MRN, date of birth, ID band Patient awake    Reviewed: Allergy & Precautions, H&P , NPO status , Patient's Chart, lab work & pertinent test results  Airway Mallampati: II TM Distance: >3 FB Neck ROM: Full    Dental   Pulmonary COPDCurrent Smoker,  + rhonchi         Cardiovascular hypertension, Rhythm:Regular Rate:Normal     Neuro/Psych    GI/Hepatic (+)     substance abuse  alcohol use and cocaine use,   Endo/Other    Renal/GU      Musculoskeletal   Abdominal (+) + obese,   Peds  Hematology   Anesthesia Other Findings   Reproductive/Obstetrics                          Anesthesia Physical Anesthesia Plan  ASA: III and emergent  Anesthesia Plan: General   Post-op Pain Management:    Induction: Intravenous, Rapid sequence and Cricoid pressure planned  Airway Management Planned: Oral ETT  Additional Equipment:   Intra-op Plan:   Post-operative Plan: Extubation in OR  Informed Consent: I have reviewed the patients History and Physical, chart, labs and discussed the procedure including the risks, benefits and alternatives for the proposed anesthesia with the patient or authorized representative who has indicated his/her understanding and acceptance.     Plan Discussed with: CRNA and Surgeon  Anesthesia Plan Comments:        Anesthesia Quick Evaluation

## 2012-10-20 NOTE — ED Notes (Signed)
Pt left with Donald [name]

## 2012-10-21 LAB — HEMOGLOBIN A1C: Mean Plasma Glucose: 128 mg/dL — ABNORMAL HIGH (ref <117)

## 2012-10-21 LAB — URINE CULTURE
Colony Count: NO GROWTH
Culture: NO GROWTH

## 2012-10-21 MED ORDER — PNEUMOCOCCAL VAC POLYVALENT 25 MCG/0.5ML IJ INJ
0.5000 mL | INJECTION | INTRAMUSCULAR | Status: AC
  Administered 2012-10-22: 0.5 mL via INTRAMUSCULAR
  Filled 2012-10-21: qty 0.5

## 2012-10-21 MED ORDER — SODIUM CHLORIDE 0.9 % IJ SOLN
3.0000 mL | Freq: Two times a day (BID) | INTRAMUSCULAR | Status: DC
  Administered 2012-10-21: 3 mL via INTRAVENOUS

## 2012-10-21 MED ORDER — SODIUM CHLORIDE 0.9 % IJ SOLN: 3.0000 mL | INTRAMUSCULAR | Status: DC | PRN

## 2012-10-21 MED ORDER — INFLUENZA VAC SPLIT QUAD 0.5 ML IM SUSP
0.5000 mL | INTRAMUSCULAR | Status: AC
  Administered 2012-10-22: 0.5 mL via INTRAMUSCULAR
  Filled 2012-10-21: qty 0.5

## 2012-10-21 MED ORDER — BACITRACIN-NEOMYCIN-POLYMYXIN 400-5-5000 EX OINT
TOPICAL_OINTMENT | CUTANEOUS | Status: AC
  Administered 2012-10-21: 1
  Filled 2012-10-21: qty 2

## 2012-10-21 NOTE — Progress Notes (Signed)
1 Day Post-Op  Subjective: Feels better today.  Irregular lesions to face and trunk, no recent HIV test, discussed and agrees.  Also complains of polys, no recent PCP evaluation Denies f/c.  Tolerating diet.  +flatus, no BM.    Objective: Vital signs in last 24 hours: Temp:  [98 F (36.7 C)-100.5 F (38.1 C)] 98.4 F (36.9 C) (09/26 0529) Pulse Rate:  [82-109] 82 (09/26 0529) Resp:  [16-20] 16 (09/26 0529) BP: (120-151)/(73-117) 131/85 mmHg (09/26 0529) SpO2:  [93 %-99 %] 93 % (09/26 0529) Weight:  [256 lb 11.2 oz (116.438 kg)] 256 lb 11.2 oz (116.438 kg) (09/26 0213) Last BM Date: 10/20/12  Intake/Output from previous day: 09/25 0701 - 09/26 0700 In: 1500 [I.V.:1500] Out: -  Intake/Output this shift:   PE General appearance: alert, cooperative, appears stated age and no distress Resp: clear to auscultation bilaterally Cardio: regular rate and rhythm, S1, S2 normal, no murmur, click, rub or gallop GI: soft, non-tender; bowel sounds normal; no masses,  no organomegaly Extremities: extremities normal, atraumatic, no cyanosis or edema  Lab Results:   Recent Labs  10/20/12 1302  WBC 13.6*  HGB 15.7  HCT 44.5  PLT 189   BMET  Recent Labs  10/20/12 1302  NA 134*  K 3.7  CL 97  CO2 26  GLUCOSE 117*  BUN 6  CREATININE 1.07  CALCIUM 9.3    Studies/Results: Ct Pelvis W Contrast  10/20/2012   CLINICAL DATA:  Perianal/ perineal swelling with tenderness. Question perirectal abscess.  EXAM: CT PELVIS WITH CONTRAST  TECHNIQUE: Multidetector CT imaging of the pelvis was performed using the standard protocol following the bolus administration of intravenous contrast.  CONTRAST:  80mL OMNIPAQUE IOHEXOL 300 MG/ML  SOLN  COMPARISON:  None.  FINDINGS: There is asymmetric soft tissue swelling throughout the right perianal subcutaneous fat. There is an ill-defined fluid collection measuring approximately 6.0 x 2.5 cm on axial image 63. This is very superficial and demonstrates no  internal air or definable fistulous track communicating with the anus. Inflammatory changes are limited to the right side of the anal crease. Within the pelvis, no perirectal inflammatory changes are identified.  There is mild sigmoid colon diverticulosis. Mildly prominent inguinal lymph nodes bilaterally are likely reactive. The urinary bladder and prostate gland appear unremarkable for the degree of inspiration. There is prominent fat within both inguinal canals.  Lower lumbar spine facet degenerative changes are noted. There are no worrisome osseous findings.  IMPRESSION: 1. Findings are consistent with a right perianal superficial abscess with surrounding cellulitis as described. No fistulous tract identified. 2. No intrapelvic inflammatory changes identified. 3. Probable reactive inguinal lymphadenopathy. 4. Bilateral inguinal hernias containing fat.   Electronically Signed   By: Roxy Horseman   On: 10/20/2012 17:20    Anti-infectives: Anti-infectives   Start     Dose/Rate Route Frequency Ordered Stop   10/20/12 2359  Ampicillin-Sulbactam (UNASYN) 3 g in sodium chloride 0.9 % 100 mL IVPB     3 g 100 mL/hr over 60 Minutes Intravenous Every 6 hours 10/20/12 2329     10/20/12 1845  clindamycin (CLEOCIN) IVPB 900 mg     900 mg 100 mL/hr over 30 Minutes Intravenous  Once 10/20/12 1831 10/20/12 1920      Assessment/Plan:  Perirectal abscess -s/p I&D Dr. Corliss Skains 10/20/12 -will evaluate wound today after he showers -BID wet to dry dressing changes -obtain HIV and Hgb A1C -tolerating diet, pain control, antiemetics -continue with IV atbx for additional  1-2 days -culture is pending -mobilize, lovenox, SCDs   LOS: 1 day   Bonner Puna [facility] ANP-BC Pager [phone]  10/21/2012 9:15 AM

## 2012-10-21 NOTE — Progress Notes (Signed)
Some pain. Pt removed dressing this am in shower. O/w no complaints just ? Not sexually active for several yrs. Last HIV test last yr was negative. Heterosexual. H/o unprotected sex  Rectal - open rt buttock wound. No dead tissue. Min drainage.  F/u cx Cont iv abx Check A1C and HIV Bowel regimen  Mary Sella. Andrey Campanile, MD, FACS General, Bariatric, & Minimally Invasive Surgery [facility], [place]

## 2012-10-22 LAB — CBC
HCT: 40.9 % (ref 39.0–52.0)
Hemoglobin: 14 g/dL (ref 13.0–17.0)
MCH: 29.2 pg (ref 26.0–34.0)
MCHC: 34.2 g/dL (ref 30.0–36.0)
MCV: 85.4 fL (ref 78.0–100.0)
Platelets: 228 10*3/uL (ref 150–400)
RBC: 4.79 MIL/uL (ref 4.22–5.81)
RDW: 12.8 % (ref 11.5–15.5)
WBC: 10 10*3/uL (ref 4.0–10.5)

## 2012-10-22 MED ORDER — AMOXICILLIN-POT CLAVULANATE 875-125 MG PO TABS: 1.0000 | ORAL_TABLET | Freq: Two times a day (BID) | ORAL | Status: DC

## 2012-10-22 MED ORDER — OXYCODONE-ACETAMINOPHEN 5-325 MG PO TABS: 1.0000 | ORAL_TABLET | ORAL | Status: DC | PRN

## 2012-10-22 MED ORDER — AMOXICILLIN-POT CLAVULANATE 875-125 MG PO TABS
1.0000 | ORAL_TABLET | Freq: Two times a day (BID) | ORAL | Status: DC
  Administered 2012-10-22: 1 via ORAL
  Filled 2012-10-22: qty 1

## 2012-10-22 NOTE — Progress Notes (Addendum)
2 Days Post-Op   Assessment: s/p Procedure(s): IRRIGATION AND DEBRIDEMENT PERIRECTAL ABSCESS Patient Active Problem List   Diagnosis Date Noted  . Perirectal abscess 10/20/2012     Improving clinically Neg HIV HgbA1C slt up  Plan: Should be able to discharge if we can be sure he has some wound care at home. Discused his HIV neg test and elevated HgbA1c. He does not now have a primary care but will plan to get one after discharge Reviewed situation and he is not candidate for home health. The wound just needs dressing changes, nothing now to pack, so this should be able to be managed by him with plans for follow up in our office in a 7-10days  Subjective: Feels better no significant pain;no BM yet. Has been up and about, took a shower this am.  Objective: Vital signs in last 24 hours: Temp:  [97.6 F (36.4 C)-98.4 F (36.9 C)] 98 F (36.7 C) (09/27 0604) Pulse Rate:  [77-90] 77 (09/27 0604) Resp:  [18-20] 20 (09/27 0604) BP: (106-142)/(67-85) 142/85 mmHg (09/27 0604) SpO2:  [96 %-98 %] 97 % (09/27 0604)   Intake/Output from previous day: 09/26 0701 - 09/27 0700 In: 1080 [P.O.:1080] Out: 600 [Urine:600]  General appearance: alert, cooperative and no distress  Incision: Wound quite clean and continuing to improve  Lab Results:   Recent Labs  10/20/12 1302 10/22/12 0422  WBC 13.6* 10.0  HGB 15.7 14.0  HCT 44.5 40.9  PLT 189 228   BMET  Recent Labs  10/20/12 1302  NA 134*  K 3.7  CL 97  CO2 26  GLUCOSE 117*  BUN 6  CREATININE 1.07  CALCIUM 9.3    MEDS, Scheduled . ampicillin-sulbactam (UNASYN) IV  3 g Intravenous Q6H  . enoxaparin (LOVENOX) injection  40 mg Subcutaneous Q24H  . influenza vac split quadrivalent PF  0.5 mL Intramuscular Tomorrow-1000  . pneumococcal 23 valent vaccine  0.5 mL Intramuscular Tomorrow-1000  . sodium chloride  3 mL Intravenous Q12H    Studies/Results: Ct Pelvis W Contrast  10/20/2012   CLINICAL DATA:  Perianal/  perineal swelling with tenderness. Question perirectal abscess.  EXAM: CT PELVIS WITH CONTRAST  TECHNIQUE: Multidetector CT imaging of the pelvis was performed using the standard protocol following the bolus administration of intravenous contrast.  CONTRAST:  80mL OMNIPAQUE IOHEXOL 300 MG/ML  SOLN  COMPARISON:  None.  FINDINGS: There is asymmetric soft tissue swelling throughout the right perianal subcutaneous fat. There is an ill-defined fluid collection measuring approximately 6.0 x 2.5 cm on axial image 63. This is very superficial and demonstrates no internal air or definable fistulous track communicating with the anus. Inflammatory changes are limited to the right side of the anal crease. Within the pelvis, no perirectal inflammatory changes are identified.  There is mild sigmoid colon diverticulosis. Mildly prominent inguinal lymph nodes bilaterally are likely reactive. The urinary bladder and prostate gland appear unremarkable for the degree of inspiration. There is prominent fat within both inguinal canals.  Lower lumbar spine facet degenerative changes are noted. There are no worrisome osseous findings.  IMPRESSION: 1. Findings are consistent with a right perianal superficial abscess with surrounding cellulitis as described. No fistulous tract identified. 2. No intrapelvic inflammatory changes identified. 3. Probable reactive inguinal lymphadenopathy. 4. Bilateral inguinal hernias containing fat.   Electronically Signed   By: Roxy Horseman   On: 10/20/2012 17:20      LOS: 2 days     Currie Paris, MD, [facility] [place] [phone]   10/22/2012 7:39 AM

## 2012-10-22 NOTE — Progress Notes (Signed)
Reviewed alll discharge instructions with pt and family.  Supplies given for changing pad/mesh after daily shower.  Rx given for percocet and antibiotic and explained.  RTO # given for FU 1 week.

## 2012-10-23 LAB — CULTURE, ROUTINE-ABSCESS

## 2012-10-25 ENCOUNTER — Encounter (HOSPITAL_COMMUNITY): Payer: Self-pay | Admitting: Surgery

## 2012-10-25 LAB — ANAEROBIC CULTURE

## 2012-10-27 NOTE — ED Provider Notes (Signed)
Medical screening examination/treatment/procedure(s) were conducted as a shared visit with non-physician practitioner(s) and myself.  I personally evaluated the patient during the encounter  Pt seen and examined.  Increasing perianal pain over several days time per pt.  Exam shows diffuse perineal cellulitis to posterior scrotum.  No Fornier's.  No draining sinus.  CT reviewed, abscess noted.  I recommend Surgical consult.  PA has discussed with Dr. Corliss Skains.  Dr Corliss Skains here and planning OR I&D.   Roney Marion, MD 10/27/12 [phone]

## 2012-11-08 ENCOUNTER — Encounter (INDEPENDENT_AMBULATORY_CARE_PROVIDER_SITE_OTHER): Payer: Self-pay | Admitting: Surgery

## 2012-11-08 ENCOUNTER — Ambulatory Visit (INDEPENDENT_AMBULATORY_CARE_PROVIDER_SITE_OTHER): Payer: Self-pay | Admitting: Surgery

## 2012-11-08 VITALS — BP 130/98 | HR 88 | Temp 97.1°F | Resp 16 | Ht 72.0 in | Wt 257.0 lb

## 2012-11-08 DIAGNOSIS — K611 Rectal abscess: Secondary | ICD-10-CM

## 2012-11-08 DIAGNOSIS — K612 Anorectal abscess: Secondary | ICD-10-CM

## 2012-11-08 NOTE — Progress Notes (Signed)
Status post incision and drainage of a very large perirectal abscess on 10/20/12. Wound is healing quite well. It is completely filled with granulation tissue. Minimal drainage. The wound is very superficial and measures 3 x 2 cm. The patient's having a lot of diarrhea from the Augmentin. He is now off of the Augmentin and his diarrhea seems to be improving. Continue with Neosporin and dry dressings until completely healed. Follow up in 3 weeks.  Wilmon Arms. Corliss Skains, MD, [facility]  General/ Trauma Surgery  11/08/2012 3:49 PM

## 2012-11-17 DIAGNOSIS — R7303 Prediabetes: Secondary | ICD-10-CM | POA: Insufficient documentation

## 2012-11-17 DIAGNOSIS — E119 Type 2 diabetes mellitus without complications: Secondary | ICD-10-CM | POA: Insufficient documentation

## 2012-11-17 NOTE — Discharge Summary (Signed)
Physician Discharge Summary  Donald [name] JYN:[id number] DOB: [birth year] DOA: 10/20/2012  PCP: No primary provider on file.  Consultation: none  Admit date: 10/20/2012 Discharge date: 10/22/2012  Recommendations for Outpatient Follow-up:    Follow-up Information   Follow up with [facility]. Schedule an appointment as soon as possible for a visit in 1 week.   Contact information:   [address]   [place] [address] [phone]      Discharge Diagnoses:  1. Perirectal abscess 2. Prediabetes    Surgical Procedure: irrigation and debridement of perirectal abscess   Discharge Condition: stable Disposition: home   Diet recommendation: regular   Filed Weights   10/21/12 0213  Weight: 256 lb 11.2 oz (116.438 kg)     Filed Vitals:   10/22/12 0604  BP: 142/85  Pulse: 77  Temp: 98 F (36.7 C)  Resp: 20    Hospital Course:  Donald [name] is a healthy 52 year old male who presented to [facility] with progressively worsening rectal pain.  His work up showed a large abscess and subsequently underwent and I&D in the OR.  Post operatively, he was transferred to the floor.  His diet was advanced as tolerated, pain managed and dressing changes.  He complained of polyuria, polydipsia and polyphagia.  Because of this a HgB A1C was obtained which was 6.1% classifying him in the prediabetes category.  TLCs were discussed and follow up with PCP for further follow up.  Due to abnormal skin lesions and presenting diagnosis, a HIV test was obtained which was negative.  On POD #2, the patient was tolerating a diet, pain was under adequate control, he was ambulating and having flatus.  He was therefore felt stable for discharge.  He will follow up with Dr. Corliss Skains in clinic in 2 weeks.     Discharge Instructions  Discharge Orders   Future Appointments Provider Department Dept Phone   11/29/2012 2:40 PM Wilmon Arms. Tsuei, MD [facility], [place] [phone]   Future Orders Complete By Expires   Change dressing (specify)  As directed    Comments:     Daily after shower or sitz bath   Diet - low sodium heart healthy  As directed    Increase activity slowly  As directed    May shower / Bathe  As directed    Comments:     Starting tomorrow       Medication List         amoxicillin-clavulanate 875-125 MG per tablet  Commonly known as:  AUGMENTIN  Take 1 tablet by mouth every 12 (twelve) hours.     multivitamin with minerals Tabs tablet  Take 1 tablet by mouth daily.     neomycin-polymyxin-pramoxine 1 % cream  Commonly known as:  NEOSPORIN PLUS  Apply 1 application topically 3 (three) times daily as needed (wound care).     oxyCODONE-acetaminophen 5-325 MG per tablet  Commonly known as:  PERCOCET/ROXICET  Take 1-2 tablets by mouth every 4 (four) hours as needed.           Follow-up Information   Follow up with [facility]. Schedule an appointment as soon as possible for a visit in 1 week.   Contact information:   [address]   [place] [address] [phone]        The results of significant diagnostics from this hospitalization (including imaging, microbiology, ancillary and laboratory)  are listed below for reference.    Significant Diagnostic Studies: Ct Pelvis W Contrast  10/20/2012   CLINICAL DATA:  Perianal/ perineal swelling with tenderness. Question perirectal abscess.  EXAM: CT PELVIS WITH CONTRAST  TECHNIQUE: Multidetector CT imaging of the pelvis was performed using the standard protocol following the bolus administration of intravenous contrast.  CONTRAST:  80mL OMNIPAQUE IOHEXOL 300 MG/ML  SOLN  COMPARISON:  None.  FINDINGS: There is asymmetric soft tissue swelling throughout the right perianal subcutaneous fat. There is an ill-defined fluid collection measuring approximately 6.0 x 2.5 cm on axial image 63. This is very superficial and demonstrates no internal air or definable fistulous  track communicating with the anus. Inflammatory changes are limited to the right side of the anal crease. Within the pelvis, no perirectal inflammatory changes are identified.  There is mild sigmoid colon diverticulosis. Mildly prominent inguinal lymph nodes bilaterally are likely reactive. The urinary bladder and prostate gland appear unremarkable for the degree of inspiration. There is prominent fat within both inguinal canals.  Lower lumbar spine facet degenerative changes are noted. There are no worrisome osseous findings.  IMPRESSION: 1. Findings are consistent with a right perianal superficial abscess with surrounding cellulitis as described. No fistulous tract identified. 2. No intrapelvic inflammatory changes identified. 3. Probable reactive inguinal lymphadenopathy. 4. Bilateral inguinal hernias containing fat.   Electronically Signed   By: Roxy Horseman   On: 10/20/2012 17:20    Principal Problem:   Perirectal abscess   Signed:  Ashok Norris, ANP-[facility] [place] [phone]

## 2012-11-29 ENCOUNTER — Encounter (INDEPENDENT_AMBULATORY_CARE_PROVIDER_SITE_OTHER): Payer: Self-pay | Admitting: Surgery

## 2012-12-07 ENCOUNTER — Encounter (INDEPENDENT_AMBULATORY_CARE_PROVIDER_SITE_OTHER): Payer: Self-pay | Admitting: Surgery

## 2014-11-15 ENCOUNTER — Emergency Department (HOSPITAL_COMMUNITY): Payer: Self-pay

## 2014-11-15 ENCOUNTER — Encounter (HOSPITAL_COMMUNITY): Payer: Self-pay | Admitting: Emergency Medicine

## 2014-11-15 ENCOUNTER — Observation Stay (HOSPITAL_COMMUNITY)
Admission: EM | Admit: 2014-11-15 | Discharge: 2014-11-16 | Disposition: A | Discharge status: Home / Self Care | Payer: Self-pay | Attending: Internal Medicine | Admitting: Internal Medicine

## 2014-11-15 DIAGNOSIS — F17200 Nicotine dependence, unspecified, uncomplicated: Secondary | ICD-10-CM | POA: Insufficient documentation

## 2014-11-15 DIAGNOSIS — J441 Chronic obstructive pulmonary disease with (acute) exacerbation: Secondary | ICD-10-CM

## 2014-11-15 DIAGNOSIS — K611 Rectal abscess: Secondary | ICD-10-CM

## 2014-11-15 DIAGNOSIS — R9431 Abnormal electrocardiogram [ECG] [EKG]: Secondary | ICD-10-CM

## 2014-11-15 DIAGNOSIS — R0789 Other chest pain: Principal | ICD-10-CM

## 2014-11-15 DIAGNOSIS — R7303 Prediabetes: Secondary | ICD-10-CM

## 2014-11-15 DIAGNOSIS — J209 Acute bronchitis, unspecified: Secondary | ICD-10-CM

## 2014-11-15 DIAGNOSIS — B0089 Other herpesviral infection: Secondary | ICD-10-CM | POA: Insufficient documentation

## 2014-11-15 DIAGNOSIS — B002 Herpesviral gingivostomatitis and pharyngotonsillitis: Secondary | ICD-10-CM

## 2014-11-15 DIAGNOSIS — R079 Chest pain, unspecified: Secondary | ICD-10-CM

## 2014-11-15 DIAGNOSIS — I480 Paroxysmal atrial fibrillation: Secondary | ICD-10-CM

## 2014-11-15 DIAGNOSIS — E119 Type 2 diabetes mellitus without complications: Secondary | ICD-10-CM | POA: Diagnosis present

## 2014-11-15 HISTORY — DX: Unspecified atrial fibrillation: I48.91

## 2014-11-15 LAB — BASIC METABOLIC PANEL
Anion gap: 9 (ref 5–15)
BUN: 14 mg/dL (ref 6–20)
CO2: 26 mmol/L (ref 22–32)
CREATININE: 1.3 mg/dL — AB (ref 0.61–1.24)
Calcium: 9.4 mg/dL (ref 8.9–10.3)
Chloride: 103 mmol/L (ref 101–111)
GFR calc Af Amer: >60 mL/min (ref >60)
GLUCOSE: 100 mg/dL — AB (ref 65–99)
Potassium: 3.8 mmol/L (ref 3.5–5.1)
Sodium: 138 mmol/L (ref 135–145)

## 2014-11-15 LAB — CBC
HCT: 44.8 % (ref 39.0–52.0)
HEMOGLOBIN: 14.7 g/dL (ref 13.0–17.0)
MCH: 27.9 pg (ref 26.0–34.0)
MCHC: 32.8 g/dL (ref 30.0–36.0)
MCV: 85.2 fL (ref 78.0–100.0)
Platelets: 199 10*3/uL (ref 150–400)
RBC: 5.26 MIL/uL (ref 4.22–5.81)
RDW: 13.1 % (ref 11.5–15.5)
WBC: 7.2 10*3/uL (ref 4.0–10.5)

## 2014-11-15 LAB — I-STAT TROPONIN, ED
TROPONIN I, POC: 0.01 ng/mL (ref 0.00–0.08)
Troponin i, poc: 0.01 ng/mL (ref 0.00–0.08)

## 2014-11-15 MED ORDER — ASPIRIN 81 MG PO CHEW
324.0000 mg | CHEWABLE_TABLET | Freq: Once | ORAL | Status: AC
  Administered 2014-11-15: 324 mg via ORAL
  Filled 2014-11-15: qty 4

## 2014-11-15 MED ORDER — LEVOFLOXACIN 750 MG PO TABS
750.0000 mg | ORAL_TABLET | Freq: Every day | ORAL | Status: DC
  Administered 2014-11-16 (×2): 750 mg via ORAL
  Filled 2014-11-15 (×2): qty 1

## 2014-11-15 MED ORDER — IPRATROPIUM-ALBUTEROL 0.5-2.5 (3) MG/3ML IN SOLN
RESPIRATORY_TRACT | Status: AC
  Administered 2014-11-15: 17:00:00
  Filled 2014-11-15: qty 3

## 2014-11-15 MED ORDER — DEXAMETHASONE 4 MG PO TABS
10.0000 mg | ORAL_TABLET | Freq: Once | ORAL | Status: AC
  Administered 2014-11-15: 10 mg via ORAL
  Filled 2014-11-15: qty 3

## 2014-11-15 MED ORDER — IPRATROPIUM-ALBUTEROL 0.5-2.5 (3) MG/3ML IN SOLN
3.0000 mL | Freq: Once | RESPIRATORY_TRACT | Status: AC
  Administered 2014-11-15: 3 mL via RESPIRATORY_TRACT
  Filled 2014-11-15: qty 3

## 2014-11-15 NOTE — ED Provider Notes (Signed)
CSN: [id number]     Arrival date & time 11/15/14  1639 History   First MD Initiated Contact with Patient 11/15/14 1932     Chief Complaint  Patient presents with  . Chest Pain  . Cough    Patient is a 54 y.o. male presenting with shortness of breath. The history is provided by the patient, the spouse and medical records.  Shortness of Breath Severity:  Moderate Onset quality:  Gradual Duration:  3 days Timing:  Constant Progression:  Worsening Chronicity:  New Context: URI (rhinorrhea, tactile fevers and chills)   Worsened by:  Deep breathing (pleuritic chest pain) Ineffective treatments:  Rest (OTC cough and cold meds) Associated symptoms: chest pain (pleuritic), cough, fever and sputum production (increased sputum from baseline)   Associated symptoms: no abdominal pain, no rash and no vomiting   Risk factors: no hx of PE/DVT      Past Medical History  Diagnosis Date  . Atrial fibrillation [facility])    Past Surgical History  Procedure Laterality Date  . Incision and drainage perirectal abscess N/A 10/20/2012    Procedure: IRRIGATION AND DEBRIDEMENT PERIRECTAL ABSCESS;  Surgeon: Wilmon Arms. Corliss Skains, MD;  Location: [facility] OR;  Service: General;  Laterality: N/A;  Lithotomy  . Ablation     History reviewed. No pertinent family history. Social History  Substance Use Topics  . Smoking status: Current Some Day Smoker -- 0.50 packs/day  . Smokeless tobacco: Never Used  . Alcohol Use: Yes    Review of Systems  Constitutional: Positive for fever, chills and fatigue.  HENT: Negative for rhinorrhea.   Eyes: Negative for visual disturbance.  Respiratory: Positive for cough, sputum production (increased sputum from baseline), chest tightness and shortness of breath.   Cardiovascular: Positive for chest pain (pleuritic). Negative for leg swelling.  Gastrointestinal: Negative for vomiting and abdominal pain.  Genitourinary: Negative for decreased urine volume.  Skin: Negative for rash.   Allergic/Immunologic: Negative for immunocompromised state.  Neurological: Negative for syncope.  Psychiatric/Behavioral: Negative for confusion.      Allergies  Review of patient's allergies indicates no known allergies.  Home Medications   Prior to Admission medications   Medication Sig Start Date End Date Taking? Authorizing Provider  lisinopril (PRINIVIL,ZESTRIL) 2.5 MG tablet Take 2.5 mg by mouth daily. 08/21/14  Yes Historical Provider, MD  metoprolol succinate (TOPROL-XL) 50 MG 24 hr tablet Take 50 mg by mouth daily. 08/21/14  Yes Historical Provider, MD  Multiple Vitamin (MULTIVITAMIN WITH MINERALS) TABS tablet Take 1 tablet by mouth daily.   Yes Historical Provider, MD  acyclovir (ZOVIRAX) 200 MG capsule Take 4 capsules (800 mg total) by mouth 3 (three) times daily. 11/16/14   Joseph Art, DO  levofloxacin (LEVAQUIN) 750 MG tablet Take 1 tablet (750 mg total) by mouth daily. 11/16/14   Joseph Art, DO  predniSONE (DELTASONE) 20 MG tablet Take 2 tablets (40 mg total) by mouth daily with breakfast. 11/17/14   Selinda Orion Vann, DO   BP 150/82 mmHg  Pulse 87  Temp(Src) 97.5 F (36.4 C) (Oral)  Resp 20  Ht 6' (1.829 m)  Wt 260 lb (117.935 kg)  BMI 35.25 kg/m2  SpO2 95% Physical Exam  Constitutional: He is oriented to person, place, and time. He appears well-developed and well-nourished. No distress.  HENT:  Head: Normocephalic and atraumatic.  Eyes: Right eye exhibits no discharge. Left eye exhibits no discharge.  Neck: No tracheal deviation present.  Cardiovascular: Normal rate and regular rhythm.  Borderline tachycardic   Pulmonary/Chest: Effort normal. No respiratory distress. He has wheezes.  Abdominal: Soft. He exhibits no distension. There is no tenderness.  Musculoskeletal: He exhibits edema.  Neurological: He is alert and oriented to person, place, and time.  Skin: Skin is warm and dry.  Psychiatric: He has a normal mood and affect. His behavior is normal.   Nursing note and vitals reviewed.   ED Course  Procedures (including critical care time) Labs Review Labs Reviewed  BASIC METABOLIC PANEL - Abnormal; Notable for the following:    Glucose, Bld 100 (*)    Creatinine, Ser 1.30 (*)    All other components within normal limits  CREATININE, SERUM - Abnormal; Notable for the following:    Creatinine, Ser 1.37 (*)    GFR calc non Af Amer 58 (*)    All other components within normal limits  HEMOGLOBIN A1C - Abnormal; Notable for the following:    Hgb A1c MFr Bld 6.1 (*)    All other components within normal limits  GLUCOSE, CAPILLARY - Abnormal; Notable for the following:    Glucose-Capillary 164 (*)    All other components within normal limits  CULTURE, EXPECTORATED SPUTUM-ASSESSMENT  CBC  CBC  TROPONIN I  TROPONIN I  I-STAT TROPOININ, ED  I-STAT TROPOININ, ED    Imaging Review No results found. I have personally reviewed and evaluated these images and lab results as part of my medical decision-making.   EKG Interpretation   Date/Time:  Thursday November 15 2014 23:25:47 EDT Ventricular Rate:  88 PR Interval:  137 QRS Duration: 94 QT Interval:  408 QTC Calculation: 494 R Axis:   65 Text Interpretation:  Sinus rhythm Left atrial enlargement Repol abnrm  suggests ischemia, inferior leads ED PHYSICIAN INTERPRETATION AVAILABLE IN  [facility] Confirmed by TEST, Record ([id number]) on 11/16/2014 7:24:07 AM      MDM   Final diagnoses:  COPD exacerbation (HCC)  EKG, abnormal  Chest pain, unspecified chest pain type    54 yo M with hx COPD, pAF s/p ablation and now not anticoagulated, hyperglycemia without dx DM presenting with SOB, cough productive of increased sputum from baseline, chills, and pleuritic CP. CXR with peribronchial thickening but no clear focal consolidation. EKG with diffuse ST/T changes concerning for ischemia with no priors for comparison available as pt gets most of his care outside this healthcare  system. Troponin neg initially. Wheezing and increase in sputum suggestive of COPD exacerbation- improved with nebs. Steroids and abx given. Cr elevated - unclear baseline for comparison to know if this represents AKI. Given significant abnormalities without clear baseline admitted for observation and further management.   Case discussed with Dr. Dalene Seltzer, who oversaw management of this patient.     Urban Gibson, MD 11/19/14 1626  Alvira Monday, MD 11/21/14 1623

## 2014-11-15 NOTE — ED Notes (Signed)
Pt reports chest burning/tightness when he coughs. Pt has had productive cough and fatigue for several days.

## 2014-11-15 NOTE — ED Notes (Signed)
HHN complete.  Pt st's he still feels very tired.  Pt alert and oriented x's 3

## 2014-11-16 ENCOUNTER — Encounter (HOSPITAL_COMMUNITY): Payer: Self-pay | Admitting: Internal Medicine

## 2014-11-16 DIAGNOSIS — B002 Herpesviral gingivostomatitis and pharyngotonsillitis: Secondary | ICD-10-CM

## 2014-11-16 DIAGNOSIS — R0789 Other chest pain: Principal | ICD-10-CM | POA: Diagnosis present

## 2014-11-16 DIAGNOSIS — R9431 Abnormal electrocardiogram [ECG] [EKG]: Secondary | ICD-10-CM | POA: Diagnosis present

## 2014-11-16 DIAGNOSIS — I480 Paroxysmal atrial fibrillation: Secondary | ICD-10-CM | POA: Diagnosis present

## 2014-11-16 DIAGNOSIS — J209 Acute bronchitis, unspecified: Secondary | ICD-10-CM | POA: Diagnosis present

## 2014-11-16 DIAGNOSIS — J441 Chronic obstructive pulmonary disease with (acute) exacerbation: Secondary | ICD-10-CM | POA: Insufficient documentation

## 2014-11-16 LAB — CBC
HCT: 45.7 % (ref 39.0–52.0)
Hemoglobin: 15 g/dL (ref 13.0–17.0)
MCH: 28.2 pg (ref 26.0–34.0)
MCHC: 32.8 g/dL (ref 30.0–36.0)
MCV: 85.9 fL (ref 78.0–100.0)
PLATELETS: 190 10*3/uL (ref 150–400)
RBC: 5.32 MIL/uL (ref 4.22–5.81)
RDW: 13.1 % (ref 11.5–15.5)
WBC: 8.1 10*3/uL (ref 4.0–10.5)

## 2014-11-16 LAB — TROPONIN I: Troponin I: <0.03 ng/mL (ref <0.031)

## 2014-11-16 LAB — GLUCOSE, CAPILLARY: GLUCOSE-CAPILLARY: 164 mg/dL — AB (ref 65–99)

## 2014-11-16 LAB — CREATININE, SERUM
CREATININE: 1.37 mg/dL — AB (ref 0.61–1.24)
GFR, EST NON AFRICAN AMERICAN: 58 mL/min — AB (ref >60)

## 2014-11-16 MED ORDER — SODIUM CHLORIDE 0.9 % IJ SOLN
3.0000 mL | Freq: Two times a day (BID) | INTRAMUSCULAR | Status: DC
  Administered 2014-11-16: 3 mL via INTRAVENOUS

## 2014-11-16 MED ORDER — ACETAMINOPHEN 325 MG PO TABS: 650.0000 mg | ORAL_TABLET | Freq: Four times a day (QID) | ORAL | Status: DC | PRN

## 2014-11-16 MED ORDER — ACYCLOVIR 200 MG PO CAPS
800.0000 mg | ORAL_CAPSULE | Freq: Three times a day (TID) | ORAL | Status: DC
  Administered 2014-11-16 (×2): 800 mg via ORAL
  Filled 2014-11-16 (×4): qty 4

## 2014-11-16 MED ORDER — IPRATROPIUM-ALBUTEROL 0.5-2.5 (3) MG/3ML IN SOLN
3.0000 mL | Freq: Four times a day (QID) | RESPIRATORY_TRACT | Status: DC
  Administered 2014-11-16: 3 mL via RESPIRATORY_TRACT
  Filled 2014-11-16 (×2): qty 3

## 2014-11-16 MED ORDER — ADULT MULTIVITAMIN W/MINERALS CH
1.0000 | ORAL_TABLET | Freq: Every day | ORAL | Status: DC
  Administered 2014-11-16: 1 via ORAL
  Filled 2014-11-16: qty 1

## 2014-11-16 MED ORDER — LISINOPRIL 2.5 MG PO TABS
2.5000 mg | ORAL_TABLET | Freq: Every day | ORAL | Status: DC
  Administered 2014-11-16: 2.5 mg via ORAL
  Filled 2014-11-16: qty 1

## 2014-11-16 MED ORDER — METOPROLOL SUCCINATE ER 50 MG PO TB24
50.0000 mg | ORAL_TABLET | Freq: Every day | ORAL | Status: DC
  Administered 2014-11-16: 50 mg via ORAL
  Filled 2014-11-16: qty 1

## 2014-11-16 MED ORDER — POTASSIUM CHLORIDE IN NACL 20-0.45 MEQ/L-% IV SOLN
INTRAVENOUS | Status: DC
  Administered 2014-11-16: 03:00:00 via INTRAVENOUS
  Filled 2014-11-16: qty 1000

## 2014-11-16 MED ORDER — MAGNESIUM SULFATE 2 GM/50ML IV SOLN
2.0000 g | Freq: Once | INTRAVENOUS | Status: AC
  Administered 2014-11-16: 2 g via INTRAVENOUS
  Filled 2014-11-16: qty 50

## 2014-11-16 MED ORDER — ONDANSETRON HCL 4 MG/2ML IJ SOLN: 4.0000 mg | Freq: Four times a day (QID) | INTRAMUSCULAR | Status: DC | PRN

## 2014-11-16 MED ORDER — METHYLPREDNISOLONE SODIUM SUCC 125 MG IJ SOLR
125.0000 mg | Freq: Once | INTRAMUSCULAR | Status: AC
  Administered 2014-11-16: 125 mg via INTRAVENOUS
  Filled 2014-11-16: qty 2

## 2014-11-16 MED ORDER — PANTOPRAZOLE SODIUM 40 MG PO TBEC
40.0000 mg | DELAYED_RELEASE_TABLET | Freq: Every day | ORAL | Status: DC
  Administered 2014-11-16: 40 mg via ORAL
  Filled 2014-11-16 (×2): qty 1

## 2014-11-16 MED ORDER — PREDNISONE 20 MG PO TABS: 40.0000 mg | ORAL_TABLET | Freq: Every day | ORAL | Status: DC

## 2014-11-16 MED ORDER — ALPRAZOLAM 0.25 MG PO TABS: 0.2500 mg | ORAL_TABLET | Freq: Two times a day (BID) | ORAL | Status: DC | PRN

## 2014-11-16 MED ORDER — ACYCLOVIR 200 MG PO CAPS: 800.0000 mg | ORAL_CAPSULE | Freq: Three times a day (TID) | ORAL | Status: DC

## 2014-11-16 MED ORDER — ENOXAPARIN SODIUM 60 MG/0.6ML ~~LOC~~ SOLN
60.0000 mg | SUBCUTANEOUS | Status: DC
Start: 2014-11-16 — End: 2014-11-16

## 2014-11-16 MED ORDER — LEVOFLOXACIN 750 MG PO TABS: 750.0000 mg | ORAL_TABLET | Freq: Every day | ORAL | Status: DC

## 2014-11-16 MED ORDER — ASPIRIN EC 81 MG PO TBEC
81.0000 mg | DELAYED_RELEASE_TABLET | Freq: Every day | ORAL | Status: DC
  Administered 2014-11-16: 81 mg via ORAL
  Filled 2014-11-16: qty 1

## 2014-11-16 NOTE — Progress Notes (Signed)
Reviewed patient discharge medications and instructions. Patient is going to make follow up appointment with primary care doctor for next week. No concerns or questions at this time.

## 2014-11-16 NOTE — H&P (Signed)
[facility] History and Physical  Donald [name] MHW:[id number] DOB: 01/07/1961 DOA: 11/15/2014  Referring physician: Urban Gibson, MD PCP: No PCP Per Patient   Chief Complaint: Chest pain and cough.  HPI: Donald [name] is a 54 y.o. male with a past medical history of paroxysmal atrial fibrillation, status post ablation, hypertension, prediabetes who comes to the emergency department due to above symptoms. Per patient, about 5-6 days ago he developed nasal congestion, rhinorrhea, sore throat and cough. Since then, his symptoms have worsened. He has had persistent cough which is now productive of greenish sputum, pleuritic chest pain and mild dyspnea. He denies fever, but complains of chills and fatigue. He denies left-sided chest pain, dizziness, diaphoresis, palpitations, PND, orthopnea or pitting edema of the lower extremities. He is currently in no acute distress.  Workup in the ER is significant for an abnormal EKG and increased bronchial markings on chest x-ray. He has a history of prior ablation and had a negative stress test last year. He is supposed to follow-up for an echocardiogram with his cardiologist next month.   Review of Systems:  Constitutional:  Positive chills, fatigue.  No weight loss, night sweats, Fevers,  HEENT:  Positive nasal congestion, post nasal drip, Sore throat, lips positive fever blisters. No headaches, Difficulty swallowing,Tooth/dental problems,  No sneezing, itching, ear ache,   Cardio-vascular:  No chest pain, Orthopnea, PND, swelling in lower extremities, anasarca, dizziness, palpitations  GI:  No heartburn, indigestion, abdominal pain, nausea, vomiting, diarrhea, change in bowel habits, loss of appetite  Resp:  Positive for dyspnea, productive cough, wheezing. He denies hemoptysis. Skin:  no rash or lesions.  GU:  no dysuria, change in color of urine, no urgency or frequency. No flank pain.  Musculoskeletal:  No joint pain or  swelling. No decreased range of motion. No back pain.  Psych:  No change in mood or affect. No depression or anxiety. No memory loss.   Past Medical History  Diagnosis Date  . Atrial fibrillation [facility])    Past Surgical History  Procedure Laterality Date  . Incision and drainage perirectal abscess N/A 10/20/2012    Procedure: IRRIGATION AND DEBRIDEMENT PERIRECTAL ABSCESS;  Surgeon: Wilmon Arms. Corliss Skains, MD;  Location: [facility] OR;  Service: General;  Laterality: N/A;  Lithotomy  . Ablation     Social History:  reports that he has been smoking.  He has never used smokeless tobacco. He reports that he drinks alcohol. He reports that he does not use illicit drugs.  No Known Allergies  History reviewed. No pertinent family history.    Prior to Admission medications   Medication Sig Start Date End Date Taking? Authorizing Provider  lisinopril (PRINIVIL,ZESTRIL) 2.5 MG tablet Take 2.5 mg by mouth daily. 08/21/14  Yes Historical Provider, MD  metoprolol succinate (TOPROL-XL) 50 MG 24 hr tablet Take 50 mg by mouth daily. 08/21/14  Yes Historical Provider, MD  Multiple Vitamin (MULTIVITAMIN WITH MINERALS) TABS tablet Take 1 tablet by mouth daily.   Yes Historical Provider, MD  amoxicillin-clavulanate (AUGMENTIN) 875-125 MG per tablet Take 1 tablet by mouth every 12 (twelve) hours. Patient not taking: Reported on 11/15/2014 10/22/12   Cyndia Bent, MD  oxyCODONE-acetaminophen (PERCOCET/ROXICET) 5-325 MG per tablet Take 1-2 tablets by mouth every 4 (four) hours as needed. Patient not taking: Reported on 11/15/2014 10/22/12   Cyndia Bent, MD   Physical Exam: Filed Vitals:   11/16/14 0100 11/16/14 0115 11/16/14 0130 11/16/14 0223  BP: 131/95 146/79 152/83 146/87  Pulse: 84  83 82 87  Temp:    98.7 F (37.1 C)  TempSrc:    Oral  Resp:  Height:    6' (1.829 m)  Weight:    117.935 kg (260 lb)  SpO2: 96% 94% 94% 96%    Wt Readings from Last 3 Encounters:  11/16/14 117.935 kg (260 lb)    11/08/12 116.574 kg (257 lb)  10/21/12 116.438 kg (256 lb 11.2 oz)    General:  Appears calm and comfortable Eyes: PERRL, normal lids, irises & conjunctiva ENT: grossly normal hearing, lips & tongue are dry. Both lips positive tender fever blisters. Neck: no LAD, masses or thyromegaly Cardiovascular: RRR, no m/r/g. No LE edema. Telemetry: SR, no arrhythmias  Respiratory: Mild wheezing and rhonchi bilaterally. Abdomen: soft, ntnd Skin: no rash or induration seen on limited exam Musculoskeletal: grossly normal tone BUE/BLE Psychiatric: grossly normal mood and affect, speech fluent and appropriate Neurologic: grossly non-focal.          Labs on Admission:  Basic Metabolic Panel:  Recent Labs Lab 11/15/14 1716  NA 138  K 3.8  CL 103  CO2 26  GLUCOSE 100*  BUN 14  CREATININE 1.30*  CALCIUM 9.4   CBC:  Recent Labs Lab 11/15/14 1716  WBC 7.2  HGB 14.7  HCT 44.8  MCV 85.2  PLT 199    Radiological Exams on Admission: Dg Chest 2 View  11/15/2014  CLINICAL DATA:  Sore throat, congestion, productive cough, chills x4 days. Pt states he has been taking OTC cold and flu medication but has been getting worse. Hx of A.Fib. Smoker. EXAM: CHEST  2 VIEW COMPARISON:  08/30/2013, report only. FINDINGS: Midline trachea. Normal heart size and mediastinal contours. No pleural effusion or pneumothorax. Diffuse peribronchial thickening. Clear lungs. IMPRESSION: 1.  No acute cardiopulmonary disease. 2. Mild peribronchial thickening which may relate to chronic bronchitis or smoking. Electronically Signed   By: Jeronimo Greaves M.D.   On: 11/15/2014 17:30    EKG: Independently reviewed. Vent. rate 93 BPM PR interval 134 ms QRS duration 84 ms QT/QTc 382/474 ms P-R-T axes 50 58 231 Normal sinus rhythm Possible Left atrial enlargement Marked ST abnormality, possible inferior subendocardial injury Abnormal ECG No previous EKG available to compare to.  Assessment/Plan Principal Problem:    Atypical chest pain/Abnormal EKG Admit to telemetry. Get serial troponin levels. Try to get EKG from patient's cardiologist Dr. Owens Shark during business hours.  Per patient, he is scheduled for an echocardiogram next month and had a negative stress test last year.  Active Problems:    Acute bronchitis Continue oral Levaquin. Continue bronchodilators and supplemental oxygen. Single dose of Solu-Medrol and single dose of magnesium sulfate were given.    Prediabetes Check hemoglobin A1c and monitor CBG.    Primary HSV infection of mouth Patient states they are very tender. Acyclovir 1000 mg by mouth 3 times a day    Paroxysmal atrial fibrillation (HCC) Continue metoprolol. Switch albuterol to Xopenex if the patient becomes tachycardic. Supplement potassium and magnesium while the patient is on bronchodilators.     Code Status: Full code. DVT Prophylaxis: Lovenox SQ Family Communication:  Disposition Plan: Admit to telemetry for troponin level trending.  Time spent: Over 70 minutes were spent during the process of this admission.  Bobette Mo [facility] Pager [phone].

## 2014-11-16 NOTE — Progress Notes (Signed)
Patient admitted to 2W35 from ED; pt is alert and oriented; VSS and within pt baseline; no s/s of distress noted at this time; will continue to monitor and assist as needed.

## 2014-11-16 NOTE — Progress Notes (Signed)
Patient ambulated 500 ft on room air, oxygen saturation 96-99%. MD notified.

## 2014-11-16 NOTE — Progress Notes (Signed)
Pt requestes order for CPAP.  He states, "I  Wear CPAP at home."

## 2014-11-16 NOTE — Discharge Summary (Signed)
Physician Discharge Summary  Donald [name] CVK:[id number] DOB: 07-Dec-1960 DOA: 11/15/2014  PCP: No PCP Per Patient  Admit date: 11/15/2014 Discharge date: 11/16/2014  Time spent: 35 minutes  Recommendations for Outpatient Follow-up:  1. BMP 1 week 2. Stop smoking 3. HgbA1C pending   Discharge Diagnoses:  Principal Problem:   Atypical chest pain Active Problems:   Prediabetes   Primary HSV infection of mouth   Paroxysmal atrial fibrillation (HCC)   Acute bronchitis   Abnormal EKG   Discharge Condition: improved  Diet recommendation: cardiac  Filed Weights   11/15/14 1644 11/16/14 0223  Weight: 117.935 kg (260 lb) 117.935 kg (260 lb)    History of present illness:  Donald [name] is a 54 y.o. male with a past medical history of paroxysmal atrial fibrillation, status post ablation, hypertension, prediabetes who comes to the emergency department due to above symptoms. Per patient, about 5-6 days ago he developed nasal congestion, rhinorrhea, sore throat and cough. Since then, his symptoms have worsened. He has had persistent cough which is now productive of greenish sputum, pleuritic chest pain and mild dyspnea. He denies fever, but complains of chills and fatigue. He denies left-sided chest pain, dizziness, diaphoresis, palpitations, PND, orthopnea or pitting edema of the lower extremities. He is currently in no acute distress.  Workup in the ER is significant for an abnormal EKG and increased bronchial markings on chest x-ray. He has a history of prior ablation and had a negative stress test last year. He is supposed to follow-up for an echocardiogram with his cardiologist next month.  Hospital Course:  Acute bronchitis Continue oral Levaquin. PO steroids x 5 days -not requiring O2 -much improved- wants to go home   Prediabetes  hemoglobin A1c pending   Primary HSV infection of mouth Patient states they are very tender. Acyclovir PO 3 times a day x 5 days    Paroxysmal atrial fibrillation (HCC) Continue metoprolol.  Procedures:    Consultations:    Discharge Exam: Filed Vitals:   11/16/14 1043  BP: 179/81  Pulse:   Temp: 97.5 F (36.4 C)  Resp: 20     Discharge Instructions   Discharge Instructions    Diet - low sodium heart healthy    Complete by:  As directed      Diet Carb Modified    Complete by:  As directed      Discharge instructions    Complete by:  As directed   Stop smoking BMP 2 weeks     Increase activity slowly    Complete by:  As directed           Current Discharge Medication List    START taking these medications   Details  acyclovir (ZOVIRAX) 200 MG capsule Take 4 capsules (800 mg total) by mouth 3 (three) times daily. Qty: 48 capsule, Refills: 0    levofloxacin (LEVAQUIN) 750 MG tablet Take 1 tablet (750 mg total) by mouth daily. Qty: 5 tablet, Refills: 0    predniSONE (DELTASONE) 20 MG tablet Take 2 tablets (40 mg total) by mouth daily with breakfast. Qty: 5 tablet, Refills: 0      CONTINUE these medications which have NOT CHANGED   Details  lisinopril (PRINIVIL,ZESTRIL) 2.5 MG tablet Take 2.5 mg by mouth daily. Refills: 3    metoprolol succinate (TOPROL-XL) 50 MG 24 hr tablet Take 50 mg by mouth daily. Refills: 5    Multiple Vitamin (MULTIVITAMIN WITH MINERALS) TABS tablet Take 1 tablet by mouth daily.  STOP taking these medications     amoxicillin-clavulanate (AUGMENTIN) 875-125 MG per tablet      oxyCODONE-acetaminophen (PERCOCET/ROXICET) 5-325 MG per tablet        No Known Allergies    The results of significant diagnostics from this hospitalization (including imaging, microbiology, ancillary and laboratory) are listed below for reference.    Significant Diagnostic Studies: Dg Chest 2 View  11/15/2014  CLINICAL DATA:  Sore throat, congestion, productive cough, chills x4 days. Pt states he has been taking OTC cold and flu medication but has been getting worse. Hx  of A.Fib. Smoker. EXAM: CHEST  2 VIEW COMPARISON:  08/30/2013, report only. FINDINGS: Midline trachea. Normal heart size and mediastinal contours. No pleural effusion or pneumothorax. Diffuse peribronchial thickening. Clear lungs. IMPRESSION: 1.  No acute cardiopulmonary disease. 2. Mild peribronchial thickening which may relate to chronic bronchitis or smoking. Electronically Signed   By: Jeronimo Greaves M.D.   On: 11/15/2014 17:30    Microbiology: No results found for this or any previous visit (from the past 240 hour(s)).   Labs: Basic Metabolic Panel:  Recent Labs Lab 11/15/14 1716 11/16/14 0310  NA 138  --   K 3.8  --   CL 103  --   CO2 26  --   GLUCOSE 100*  --   BUN 14  --   CREATININE 1.30* 1.37*  CALCIUM 9.4  --    Liver Function Tests: No results for input(s): AST, ALT, ALKPHOS, BILITOT, PROT, ALBUMIN in the last 168 hours. No results for input(s): LIPASE, AMYLASE in the last 168 hours. No results for input(s): AMMONIA in the last 168 hours. CBC:  Recent Labs Lab 11/15/14 1716 11/16/14 0310  WBC 7.2 8.1  HGB 14.7 15.0  HCT 44.8 45.7  MCV 85.2 85.9  PLT 199 190   Cardiac Enzymes:  Recent Labs Lab 11/16/14 0310 11/16/14 0812  TROPONINI <0.03 <0.03   BNP: BNP (last 3 results) No results for input(s): BNP in the last 8760 hours.  ProBNP (last 3 results) No results for input(s): PROBNP in the last 8760 hours.  CBG:  Recent Labs Lab 11/16/14 0652  GLUCAP 164*       Signed:  Marlin Canary  [facility] 11/16/2014, 12:33 PM

## 2014-11-16 NOTE — Care Management Note (Signed)
Case Management Note Donn Pierini RN, BSN Unit 2W-Case Manager [phone]  Patient Details  Name: Donald [name] MRN: [id number] Date of Birth: 15-Oct-1960  Subjective/Objective:     Pt admitted with chest pain               Action/Plan: PTA pt lived at home- referral received for PCP needs- spoke with pt at bedside- per conversation pt states that [place] with [facility] has spoken with him and will f/u post discharge- pt also reports that he use to see a PCP with the [facility]- and that he plans to reapply for their program next month. 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 Pt interested in the [facility]- info given to pt on the clinic- pt plans to call for his own appointment so that it doesn't interfere with his class schedule -   Expected Discharge Date:       11/16/14           Expected Discharge Plan:  Home/Self Care  In-House Referral:     Discharge planning Services  CM Consult, [facility]  Post Acute Care Choice:    Choice offered to:     DME Arranged:    DME Agency:     HH Arranged:    HH Agency:     Status of Service:  Completed, signed off  Medicare Important Message Given:    Date Medicare IM Given:    Medicare IM give by:    Date Additional Medicare IM Given:    Additional Medicare Important Message give by:     If discussed at Long Length of Stay Meetings, dates discussed:    Additional Comments:  Darrold Span, RN 11/16/2014, 1:54 PM

## 2014-11-17 LAB — HEMOGLOBIN A1C
HEMOGLOBIN A1C: 6.1 % — AB (ref 4.8–5.6)
MEAN PLASMA GLUCOSE: 128 mg/dL

## 2015-09-17 ENCOUNTER — Ambulatory Visit: Payer: Self-pay | Admitting: Internal Medicine

## 2015-10-07 ENCOUNTER — Encounter (HOSPITAL_COMMUNITY): Payer: Self-pay | Admitting: *Deleted

## 2015-10-07 ENCOUNTER — Emergency Department (HOSPITAL_COMMUNITY)
Admission: EM | Admit: 2015-10-07 | Discharge: 2015-10-07 | Disposition: A | Discharge status: Home / Self Care | Payer: Self-pay | Attending: Emergency Medicine | Admitting: Emergency Medicine

## 2015-10-07 ENCOUNTER — Emergency Department (HOSPITAL_COMMUNITY): Payer: Self-pay

## 2015-10-07 DIAGNOSIS — B349 Viral infection, unspecified: Secondary | ICD-10-CM | POA: Insufficient documentation

## 2015-10-07 DIAGNOSIS — J441 Chronic obstructive pulmonary disease with (acute) exacerbation: Secondary | ICD-10-CM | POA: Insufficient documentation

## 2015-10-07 DIAGNOSIS — F17200 Nicotine dependence, unspecified, uncomplicated: Secondary | ICD-10-CM | POA: Insufficient documentation

## 2015-10-07 HISTORY — DX: Chronic obstructive pulmonary disease, unspecified: J44.9

## 2015-10-07 LAB — CBC
HEMATOCRIT: 49.9 % (ref 39.0–52.0)
Hemoglobin: 16 g/dL (ref 13.0–17.0)
MCH: 27.6 pg (ref 26.0–34.0)
MCHC: 32.1 g/dL (ref 30.0–36.0)
MCV: 86.2 fL (ref 78.0–100.0)
PLATELETS: 184 10*3/uL (ref 150–400)
RBC: 5.79 MIL/uL (ref 4.22–5.81)
RDW: 13 % (ref 11.5–15.5)
WBC: 5.7 10*3/uL (ref 4.0–10.5)

## 2015-10-07 LAB — BASIC METABOLIC PANEL
Anion gap: 10 (ref 5–15)
BUN: 15 mg/dL (ref 6–20)
CHLORIDE: 99 mmol/L — AB (ref 101–111)
CO2: 28 mmol/L (ref 22–32)
CREATININE: 1.21 mg/dL (ref 0.61–1.24)
Calcium: 9.6 mg/dL (ref 8.9–10.3)
GFR calc Af Amer: >60 mL/min (ref >60)
GFR calc non Af Amer: >60 mL/min (ref >60)
GLUCOSE: 110 mg/dL — AB (ref 65–99)
Potassium: 4.3 mmol/L (ref 3.5–5.1)
SODIUM: 137 mmol/L (ref 135–145)

## 2015-10-07 MED ORDER — ALBUTEROL SULFATE (2.5 MG/3ML) 0.083% IN NEBU: 2.5000 mg | INHALATION_SOLUTION | RESPIRATORY_TRACT | 0 refills | Status: AC | PRN

## 2015-10-07 MED ORDER — ALBUTEROL SULFATE (2.5 MG/3ML) 0.083% IN NEBU
5.0000 mg | INHALATION_SOLUTION | Freq: Once | RESPIRATORY_TRACT | Status: AC
  Administered 2015-10-07: 5 mg via RESPIRATORY_TRACT
  Filled 2015-10-07: qty 6

## 2015-10-07 MED ORDER — ALBUTEROL SULFATE HFA 108 (90 BASE) MCG/ACT IN AERS
2.0000 | INHALATION_SPRAY | RESPIRATORY_TRACT | Status: DC | PRN
  Administered 2015-10-07: 2 via RESPIRATORY_TRACT
  Filled 2015-10-07: qty 6.7

## 2015-10-07 NOTE — ED Notes (Signed)
Registration at beside 

## 2015-10-07 NOTE — ED Triage Notes (Signed)
Pt states since Thursday he's had diarrhea, and burning in his chest when he coughs.  Denies abdominal pain or vomiting.

## 2015-10-07 NOTE — ED Provider Notes (Signed)
[facility]-EMERGENCY DEPT Provider Note   CSN: [id number] Arrival date & time: 10/07/15  1148     History   Chief Complaint Chief Complaint  Patient presents with  . Cough  . Shortness of Breath    HPI Donald [name] is a 55 y.o. male.  Patient is a 55 year old male with a history of atrial fibrillation and COPD. He status post ablation. He previously used inhalers but hasn't had any in a long time. He states over the last 3-4 days he's had flulike symptoms with myalgias and chills. No known fevers. He's had some nausea and loose stools. He's had a cough which is nonproductive. No significant nasal congestion. No sore throat. He states his chest is a wheezy and tight. He feels short of breath. He has not had an inhaler to use at home. He denies any leg pain or swelling.    Cough  Associated symptoms include chills and shortness of breath. Pertinent negatives include no chest pain, no headaches and no rhinorrhea.  Shortness of Breath  Associated symptoms include cough. Pertinent negatives include no fever, no headaches, no rhinorrhea, no chest pain, no vomiting, no abdominal pain, no rash and no leg swelling.    Past Medical History:  Diagnosis Date  . Atrial fibrillation (HCC)   . COPD (chronic obstructive pulmonary disease) [facility])     Patient Active Problem List   Diagnosis Date Noted  . COPD exacerbation (HCC) 11/16/2014  . Atypical chest pain 11/16/2014  . 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 Paroxysmal atrial fibrillation (HCC) 11/16/2014  . Acute bronchitis 11/16/2014  . Abnormal EKG 11/16/2014  . Prediabetes 11/17/2012  . Perirectal abscess 10/20/2012    Past Surgical History:  Procedure Laterality Date  . ABLATION    . INCISION AND DRAINAGE PERIRECTAL ABSCESS N/A 10/20/2012   Procedure: IRRIGATION AND DEBRIDEMENT PERIRECTAL ABSCESS;  Surgeon: Wilmon Arms. Corliss Skains, MD;  Location: [facility] OR;  Service: General;  Laterality: N/A;  Lithotomy       Home Medications     Prior to Admission medications   Medication Sig Start Date End Date Taking? Authorizing Provider  acyclovir (ZOVIRAX) 200 MG capsule Take 4 capsules (800 mg total) by mouth 3 (three) times daily. 11/16/14   Joseph Art, DO  albuterol (PROVENTIL) (2.5 MG/3ML) 0.083% nebulizer solution Take 3 mLs (2.5 mg total) by nebulization every 4 (four) hours as needed for wheezing or shortness of breath. 10/07/15   Rolan Bucco, MD  levofloxacin (LEVAQUIN) 750 MG tablet Take 1 tablet (750 mg total) by mouth daily. 11/16/14   Joseph Art, DO  lisinopril (PRINIVIL,ZESTRIL) 2.5 MG tablet Take 2.5 mg by mouth daily. 08/21/14   Historical Provider, MD  metoprolol succinate (TOPROL-XL) 50 MG 24 hr tablet Take 50 mg by mouth daily. 08/21/14   Historical Provider, MD  Multiple Vitamin (MULTIVITAMIN WITH MINERALS) TABS tablet Take 1 tablet by mouth daily.    Historical Provider, MD  predniSONE (DELTASONE) 20 MG tablet Take 2 tablets (40 mg total) by mouth daily with breakfast. 11/17/14   Joseph Art, DO    Family History No family history on file.  Social History Social History  Substance Use Topics  . Smoking status: Current Some Day Smoker    Packs/day: 0.50  . Smokeless tobacco: Never Used  . Alcohol use Yes     Allergies   Review of patient's allergies indicates no known allergies.   Review of Systems Review of Systems  Constitutional: Positive for chills  and fatigue. Negative for diaphoresis and fever.  HENT: Negative for congestion, rhinorrhea and sneezing.   Eyes: Negative.   Respiratory: Positive for cough and shortness of breath. Negative for chest tightness.   Cardiovascular: Negative for chest pain and leg swelling.  Gastrointestinal: Positive for diarrhea and nausea. Negative for abdominal pain, blood in stool and vomiting.  Genitourinary: Negative for difficulty urinating, flank pain, frequency and hematuria.  Musculoskeletal: Negative for arthralgias and back pain.  Skin:  Negative for rash.  Neurological: Negative for dizziness, speech difficulty, weakness, numbness and headaches.     Physical Exam Updated Vital Signs BP 104/86   Pulse 94   Temp 99.1 F (37.3 C) (Oral)   Resp 16   Ht 6\' 2"  (1.88 m)   Wt 260 lb (117.9 kg)   SpO2 96%   BMI 33.38 kg/m   Physical Exam  Constitutional: He is oriented to person, place, and time. He appears well-developed and well-nourished.  HENT:  Head: Normocephalic and atraumatic.  Mouth/Throat: Oropharynx is clear and moist.  Eyes: Pupils are equal, round, and reactive to light.  Neck: Normal range of motion. Neck supple.  Cardiovascular: Normal rate, regular rhythm and normal heart sounds.   Pulmonary/Chest: Effort normal. No respiratory distress. He has wheezes. He has no rales. He exhibits no tenderness.  Mildly diminished breath sounds bilaterally with some mild expiratory wheezes. He has no increased work of breathing. He's talking in full sentences.  Abdominal: Soft. Bowel sounds are normal. There is no tenderness. There is no rebound and no guarding.  Musculoskeletal: Normal range of motion. He exhibits no edema.  No edema or calf tenderness  Lymphadenopathy:    He has no cervical adenopathy.  Neurological: He is alert and oriented to person, place, and time.  Skin: Skin is warm and dry. No rash noted.  Psychiatric: He has a normal mood and affect.     ED Treatments / Results  Labs (all labs ordered are listed, but only abnormal results are displayed) Labs Reviewed  BASIC METABOLIC PANEL - Abnormal; Notable for the following:       Result Value   Chloride 99 (*)    Glucose, Bld 110 (*)    All other components within normal limits  CBC    EKG  EKG Interpretation  Date/Time:  Monday October 07 2015 12:54:47 EDT Ventricular Rate:  93 PR Interval:  134 QRS Duration: 86 QT Interval:  384 QTC Calculation: 477 R Axis:   84 Text Interpretation:  Normal sinus rhythm Possible Left atrial  enlargement ST & T wave abnormality, consider inferolateral ischemia Prolonged QT Abnormal ECG since last tracing no significant change Confirmed by Christie Viscomi  MD, Adonay Scheier ([id number]) on 10/07/2015 3:36:27 PM       Radiology Dg Chest 2 View  Result Date: 10/07/2015 CLINICAL DATA:  Chest pain and weakness since Thursday. Atrial fibrillation. EXAM: CHEST  2 VIEW COMPARISON:  11/15/2014 FINDINGS: The heart size and mediastinal contours are within normal limits. Both lungs are clear. The visualized skeletal structures are unremarkable. IMPRESSION: No active cardiopulmonary disease. Electronically Signed   By: Gaylyn RongWalter  Liebkemann M.D.   On: 10/07/2015 13:49    Procedures Procedures (including critical care time)  Medications Ordered in ED Medications  albuterol (PROVENTIL HFA;VENTOLIN HFA) 108 (90 Base) MCG/ACT inhaler 2 puff (not administered)  albuterol (PROVENTIL) (2.5 MG/3ML) 0.083% nebulizer solution 5 mg (5 mg Nebulization Given 10/07/15 1619)     Initial Impression / Assessment and Plan / ED Course  I  have reviewed the triage vital signs and the nursing notes.  Pertinent labs & imaging results that were available during my care of the patient were reviewed by me and considered in my medical decision making (see chart for details).  Clinical Course    Patient presents with a viral type illness. He has myalgias chills and some diarrhea. He's otherwise well-appearing. His vital signs are stable. There is no signs of dehydration. There is no evidence of pneumonia. He had a little bit of wheezing associated with this illness and likely has a mild COPD exacerbation. He was given an albuterol neb in the ED and feels much better. His oxygen saturations are staying stable. He has no increased work of breathing. He was discharged home in good condition. He was given symptomatic care instructions. He was dispensed an albuterol MDI. He also has a nebulizer machine at home and requested some solution for his  nebulizer to use as needed. He was advised to follow-up with her primary care provider. Return precautions were given.  Final Clinical Impressions(s) / ED Diagnoses   Final diagnoses:  COPD exacerbation (HCC)  Viral syndrome    New Prescriptions New Prescriptions   ALBUTEROL (PROVENTIL) (2.5 MG/3ML) 0.083% NEBULIZER SOLUTION    Take 3 mLs (2.5 mg total) by nebulization every 4 (four) hours as needed for wheezing or shortness of breath.     Rolan Bucco, MD 10/07/15 [phone]

## 2015-10-28 ENCOUNTER — Ambulatory Visit: Payer: Self-pay | Admitting: Internal Medicine

## 2016-05-19 ENCOUNTER — Emergency Department (HOSPITAL_COMMUNITY): Payer: Self-pay

## 2016-05-19 ENCOUNTER — Emergency Department (HOSPITAL_COMMUNITY)
Admission: EM | Admit: 2016-05-19 | Discharge: 2016-05-19 | Disposition: A | Discharge status: Home / Self Care | Payer: Self-pay | Attending: Emergency Medicine | Admitting: Emergency Medicine

## 2016-05-19 ENCOUNTER — Encounter (HOSPITAL_COMMUNITY): Payer: Self-pay | Admitting: Emergency Medicine

## 2016-05-19 DIAGNOSIS — L0291 Cutaneous abscess, unspecified: Secondary | ICD-10-CM

## 2016-05-19 DIAGNOSIS — J189 Pneumonia, unspecified organism: Secondary | ICD-10-CM | POA: Insufficient documentation

## 2016-05-19 DIAGNOSIS — L0231 Cutaneous abscess of buttock: Secondary | ICD-10-CM | POA: Insufficient documentation

## 2016-05-19 DIAGNOSIS — Z7982 Long term (current) use of aspirin: Secondary | ICD-10-CM | POA: Insufficient documentation

## 2016-05-19 DIAGNOSIS — J449 Chronic obstructive pulmonary disease, unspecified: Secondary | ICD-10-CM | POA: Insufficient documentation

## 2016-05-19 DIAGNOSIS — F17200 Nicotine dependence, unspecified, uncomplicated: Secondary | ICD-10-CM | POA: Insufficient documentation

## 2016-05-19 LAB — COMPREHENSIVE METABOLIC PANEL
ALT: 26 U/L (ref 17–63)
AST: 31 U/L (ref 15–41)
Albumin: 3.5 g/dL (ref 3.5–5.0)
Alkaline Phosphatase: 58 U/L (ref 38–126)
Anion gap: 11 (ref 5–15)
BILIRUBIN TOTAL: 1.1 mg/dL (ref 0.3–1.2)
BUN: 8 mg/dL (ref 6–20)
CO2: 25 mmol/L (ref 22–32)
CREATININE: 1.13 mg/dL (ref 0.61–1.24)
Calcium: 9 mg/dL (ref 8.9–10.3)
Chloride: 101 mmol/L (ref 101–111)
GFR calc Af Amer: >60 mL/min (ref >60)
Glucose, Bld: 119 mg/dL — ABNORMAL HIGH (ref 65–99)
Potassium: 3.7 mmol/L (ref 3.5–5.1)
Sodium: 137 mmol/L (ref 135–145)
TOTAL PROTEIN: 6.9 g/dL (ref 6.5–8.1)

## 2016-05-19 LAB — CBC WITH DIFFERENTIAL/PLATELET
Basophils Absolute: 0 K/uL (ref 0.0–0.1)
Basophils Relative: 0 %
Eosinophils Absolute: 0.2 K/uL (ref 0.0–0.7)
Eosinophils Relative: 4 %
HCT: 42.7 % (ref 39.0–52.0)
Hemoglobin: 14 g/dL (ref 13.0–17.0)
Lymphocytes Relative: 25 %
Lymphs Abs: 1.2 K/uL (ref 0.7–4.0)
MCH: 27.7 pg (ref 26.0–34.0)
MCHC: 32.8 g/dL (ref 30.0–36.0)
MCV: 84.6 fL (ref 78.0–100.0)
Monocytes Absolute: 0.4 K/uL (ref 0.1–1.0)
Monocytes Relative: 9 %
Neutro Abs: 2.9 K/uL (ref 1.7–7.7)
Neutrophils Relative %: 62 %
Platelets: 143 K/uL — ABNORMAL LOW (ref 150–400)
RBC: 5.05 MIL/uL (ref 4.22–5.81)
RDW: 13.8 % (ref 11.5–15.5)
WBC: 4.7 K/uL (ref 4.0–10.5)

## 2016-05-19 MED ORDER — TETANUS-DIPHTH-ACELL PERTUSSIS 5-2.5-18.5 LF-MCG/0.5 IM SUSP
0.5000 mL | Freq: Once | INTRAMUSCULAR | Status: AC
  Administered 2016-05-19: 0.5 mL via INTRAMUSCULAR
  Filled 2016-05-19: qty 0.5

## 2016-05-19 MED ORDER — OXYCODONE-ACETAMINOPHEN 5-325 MG PO TABS
1.0000 | ORAL_TABLET | Freq: Once | ORAL | Status: AC
  Administered 2016-05-19: 1 via ORAL
  Filled 2016-05-19: qty 1

## 2016-05-19 MED ORDER — OXYCODONE-ACETAMINOPHEN 5-325 MG PO TABS: 1.0000 | ORAL_TABLET | Freq: Four times a day (QID) | ORAL | 0 refills | Status: DC | PRN

## 2016-05-19 MED ORDER — AEROCHAMBER PLUS FLO-VU LARGE MISC
Status: AC
  Filled 2016-05-19: qty 1

## 2016-05-19 MED ORDER — ALBUTEROL SULFATE HFA 108 (90 BASE) MCG/ACT IN AERS
2.0000 | INHALATION_SPRAY | RESPIRATORY_TRACT | Status: DC | PRN
  Administered 2016-05-19: 2 via RESPIRATORY_TRACT
  Filled 2016-05-19: qty 6.7

## 2016-05-19 MED ORDER — ALBUTEROL SULFATE (2.5 MG/3ML) 0.083% IN NEBU
5.0000 mg | INHALATION_SOLUTION | Freq: Once | RESPIRATORY_TRACT | Status: AC
  Administered 2016-05-19: 5 mg via RESPIRATORY_TRACT
  Filled 2016-05-19: qty 6

## 2016-05-19 MED ORDER — DOXYCYCLINE HYCLATE 100 MG PO TABS
100.0000 mg | ORAL_TABLET | Freq: Once | ORAL | Status: AC
  Administered 2016-05-19: 100 mg via ORAL
  Filled 2016-05-19: qty 1

## 2016-05-19 MED ORDER — AEROCHAMBER PLUS W/MASK MISC
1.0000 | Freq: Once | Status: AC
  Administered 2016-05-19: 1
  Filled 2016-05-19: qty 1

## 2016-05-19 MED ORDER — DOXYCYCLINE HYCLATE 100 MG PO CAPS: 100.0000 mg | ORAL_CAPSULE | Freq: Two times a day (BID) | ORAL | 0 refills | Status: DC

## 2016-05-19 NOTE — ED Triage Notes (Signed)
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 Pt states also his joint are aching.

## 2016-05-19 NOTE — ED Notes (Signed)
Patient transported to X-ray 

## 2016-05-19 NOTE — ED Notes (Signed)
Papers reviewed with patient and he verbalizes understanding and intent to follow up. Specific questions answered for patients about the specific type of bath

## 2016-05-19 NOTE — Discharge Instructions (Signed)
Sit in bathtub 4 times daily for 30 minutes at a time. Take Tylenol for mild pain or the pain medicine prescribed for bad pain. Don't take Tylenol together with the pain medicine prescribed as the combination can be dangerous to your liver. Use your albuterol inhaler 2 puffs every 4 hours as needed for cough or shortness of breath. Return if needed more than every 4 hours. Make sure that you finish the antibiotic as prescribed. Keep your scheduled appointment with the [facility] for 05/29/2016. rETURN if concern for any reason

## 2016-05-19 NOTE — ED Provider Notes (Addendum)
[facility]-EMERGENCY DEPT Provider Note   CSN: [id number] Arrival date & time: 05/19/16  7829     History   Chief Complaint Chief Complaint  Patient presents with  . Wound Check    HPI Donald [name] is a 56 y.o. male.  HPI Patient had a "boil" at left buttock "pop" 3 days ago. Since the event he's had shortness of breath with cough and wheeze and nausea and vomiting. Last Vomited 6 timestwodays ago.. Admits to diminished appetite yesterday. Other associated symptoms include generalized weakness. Treated himself with his on Percocet with partial relief of pain at left buttock and also with Aleve. Also treated himself with albuterol inhaler this morning without relief of dyspnea or wheeze. No other associated symptoms. Pain on left buttock is worse with pressure on the area not improved with anything. Past Medical History:  Diagnosis Date  . Atrial fibrillation (HCC)   . COPD (chronic obstructive pulmonary disease) [facility])     Patient Active Problem List   Diagnosis Date Noted  . COPD exacerbation (HCC) 11/16/2014  . Atypical chest pain 11/16/2014  . 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 Paroxysmal atrial fibrillation (HCC) 11/16/2014  . Acute bronchitis 11/16/2014  . Abnormal EKG 11/16/2014  . Prediabetes 11/17/2012  . Perirectal abscess 10/20/2012    Past Surgical History:  Procedure Laterality Date  . ABLATION    . INCISION AND DRAINAGE PERIRECTAL ABSCESS N/A 10/20/2012   Procedure: IRRIGATION AND DEBRIDEMENT PERIRECTAL ABSCESS;  Surgeon: Wilmon Arms. Corliss Skains, MD;  Location: [facility] OR;  Service: General;  Laterality: N/A;  Lithotomy       Home Medications    Prior to Admission medications   Medication Sig Start Date End Date Taking? Authorizing Provider  albuterol (PROVENTIL HFA;VENTOLIN HFA) 108 (90 Base) MCG/ACT inhaler Inhale 2 puffs into the lungs every 4 (four) hours as needed for shortness of breath. 02/07/16 02/06/17 Yes Historical Provider, MD  aspirin 81 MG  chewable tablet Chew 81 mg by mouth daily. 02/08/16  Yes Historical Provider, MD  lisinopril (PRINIVIL,ZESTRIL) 10 MG tablet Take 20 mg by mouth 2 (two) times daily.  02/28/16 02/27/17 Yes Historical Provider, MD  lovastatin (MEVACOR) 10 MG tablet Take 10 mg by mouth daily. 02/07/16  Yes Historical Provider, MD  metoprolol tartrate (LOPRESSOR) 25 MG tablet Take 25 mg by mouth 2 (two) times daily. 02/07/16  Yes Historical Provider, MD  acyclovir (ZOVIRAX) 200 MG capsule Take 4 capsules (800 mg total) by mouth 3 (three) times daily. Patient not taking: Reported on 05/19/2016 11/16/14   Joseph Art, DO  albuterol (PROVENTIL) (2.5 MG/3ML) 0.083% nebulizer solution Take 3 mLs (2.5 mg total) by nebulization every 4 (four) hours as needed for wheezing or shortness of breath. Patient not taking: Reported on 05/19/2016 10/07/15   Rolan Bucco, MD    Family History No family history on file.  Social History Social History  Substance Use Topics  . Smoking status: Current Some Day Smoker    Packs/day: 0.50  . Smokeless tobacco: Never Used  . Alcohol use Yes     Allergies   Patient has no known allergies.   Review of Systems Review of Systems  Constitutional: Positive for appetite change.  Respiratory: Positive for cough, shortness of breath and wheezing.   Gastrointestinal: Positive for nausea and vomiting.  Skin: Positive for wound.       Boil left buttock  All other systems reviewed and are negative.    Physical Exam Updated Vital Signs BP Marland Kitchen)  140/99   Pulse 92   Temp 99.3 F (37.4 C) (Oral)   Resp (!) 21   SpO2 94%   Physical Exam  Constitutional: He appears well-developed and well-nourished. No distress.  HENT:  Head: Normocephalic and atraumatic.  Eyes: Conjunctivae are normal. Pupils are equal, round, and reactive to light.  Neck: Neck supple. No tracheal deviation present. No thyromegaly present.  Cardiovascular: Normal rate and regular rhythm.   No murmur  heard. Pulmonary/Chest: Effort normal. He has wheezes.  No respiratory distress. Expiratory wheezes. Coughing occasionally  Abdominal: Soft. Bowel sounds are normal. He exhibits no distension. There is no tenderness.  Obese  Musculoskeletal: Normal range of motion. He exhibits no edema or tenderness.  Neurological: He is alert. Coordination normal.  Skin: Skin is warm and dry. No rash noted.  Dime-sized  lesion at left buttock at gluteal fold no fluctuant swelling or drainage. Clean-appearing  Psychiatric: He has a normal mood and affect.  Nursing note and vitals reviewed.    ED Treatments / Results  Labs (all labs ordered are listed, but only abnormal results are displayed) Labs Reviewed  CBC WITH DIFFERENTIAL/PLATELET  COMPREHENSIVE METABOLIC PANEL    EKG  EKG Interpretation None       Radiology No results found.  Procedures Procedures (including critical care time) Chest x-ray viewed by me Medications Ordered in ED Medications  albuterol (PROVENTIL) (2.5 MG/3ML) 0.083% nebulizer solution 5 mg (not administered)  oxyCODONE-acetaminophen (PERCOCET/ROXICET) 5-325 MG per tablet 1 tablet (not administered)    Results for orders placed or performed during the hospital encounter of 05/19/16  CBC with Differential/Platelet  Result Value Ref Range   WBC 4.7 4.0 - 10.5 K/uL   RBC 5.05 4.22 - 5.81 MIL/uL   Hemoglobin 14.0 13.0 - 17.0 g/dL   HCT 16.1 09.6 - 04.5 %   MCV 84.6 78.0 - 100.0 fL   MCH 27.7 26.0 - 34.0 pg   MCHC 32.8 30.0 - 36.0 g/dL   RDW 40.9 81.1 - 91.4 %   Platelets 143 (L) 150 - 400 K/uL   Neutrophils Relative % 62 %   Neutro Abs 2.9 1.7 - 7.7 K/uL   Lymphocytes Relative 25 %   Lymphs Abs 1.2 0.7 - 4.0 K/uL   Monocytes Relative 9 %   Monocytes Absolute 0.4 0.1 - 1.0 K/uL   Eosinophils Relative 4 %   Eosinophils Absolute 0.2 0.0 - 0.7 K/uL   Basophils Relative 0 %   Basophils Absolute 0.0 0.0 - 0.1 K/uL  Comprehensive metabolic panel  Result Value  Ref Range   Sodium 137 135 - 145 mmol/L   Potassium 3.7 3.5 - 5.1 mmol/L   Chloride 101 101 - 111 mmol/L   CO2 25 22 - 32 mmol/L   Glucose, Bld 119 (H) 65 - 99 mg/dL   BUN 8 6 - 20 mg/dL   Creatinine, Ser 7.82 0.61 - 1.24 mg/dL   Calcium 9.0 8.9 - 95.6 mg/dL   Total Protein 6.9 6.5 - 8.1 g/dL   Albumin 3.5 3.5 - 5.0 g/dL   AST 31 15 - 41 U/L   ALT 26 17 - 63 U/L   Alkaline Phosphatase 58 38 - 126 U/L   Total Bilirubin 1.1 0.3 - 1.2 mg/dL   GFR calc non Af Amer >60 >60 mL/min   GFR calc Af Amer >60 >60 mL/min   Anion gap 11 5 - 15   Dg Chest 2 View  Result Date: 05/19/2016 CLINICAL DATA:  Dry  cough. EXAM: CHEST  2 VIEW COMPARISON:  02/06/2016 . FINDINGS: Mediastinum hilar structures are normal. Stable mild cardiomegaly. No pulmonary venous congestion. Mild bilateral interstitial prominence, left side greater right. Findings suggest mild pneumonitis. No pleural effusion or pneumothorax no acute bony abnormality. IMPRESSION: 1. Mild bilateral interstitial prominence, left side greater right. Findings suggest mild pneumonitis. 2. Mild stable cardiomegaly.  No pulmonary venous congestion. Electronically Signed   By: Maisie Fus  Register   On: 05/19/2016 11:16   Initial Impression / Assessment and Plan / ED Course  I have reviewed the triage vital signs and the nursing notes. Port score equals 54. Patient stable for discharge Pertinent labs & imaging results that were available during my care of the patient were reviewed by me and considered in my medical decision making (see chart for details).     1:50 PM states breathing is normal after treatment with albuterol nebulizer treatment. Plan prescription doxycycline. Albuterol HFA with spacer to go prescription Percocet. Warm soaks. I've had case management talk the patient. An [facility] has [facility] for 05/29/2016 [facility] Controlled Substance reporting System queried  Final Clinical Impressions(s) / ED  Diagnoses  Diagnosis #1 healing abscess of left buttock #2 pneumonitis Final diagnoses:  None    New Prescriptions New Prescriptions   No medications on file     Doug Sou, MD 05/19/16 1401    Doug Sou, MD 05/19/16 1407

## 2016-05-19 NOTE — Discharge Planning (Signed)
Exander Shaul J. Lucretia Roers, RN, BSN, [place] [phone]  [facility] set up appointment with [facility] on May 4 @1115 .  Spoke with pt and Aunt at bedside and advised to please arrive 15 min early and take a picture ID and your current medications.  Pt verbalizes understanding of keeping appointment.

## 2016-05-20 LAB — HIV ANTIBODY (ROUTINE TESTING W REFLEX): HIV SCREEN 4TH GENERATION: NONREACTIVE

## 2016-05-29 ENCOUNTER — Inpatient Hospital Stay (INDEPENDENT_AMBULATORY_CARE_PROVIDER_SITE_OTHER): Payer: Self-pay | Admitting: Physician Assistant

## 2018-07-05 ENCOUNTER — Other Ambulatory Visit: Payer: Self-pay | Admitting: *Deleted

## 2018-07-05 DIAGNOSIS — Z20822 Contact with and (suspected) exposure to covid-19: Secondary | ICD-10-CM

## 2018-07-14 NOTE — Addendum Note (Signed)
Addended by: Esmerelda Finnigan M on: 07/14/2018 11:23 AM   Modules accepted: Orders  

## 2018-07-18 ENCOUNTER — Other Ambulatory Visit: Payer: Self-pay | Admitting: *Deleted

## 2018-07-18 DIAGNOSIS — Z20822 Contact with and (suspected) exposure to covid-19: Secondary | ICD-10-CM

## 2018-07-20 NOTE — Addendum Note (Signed)
Addended by: Brigitte Pulse on: 07/20/2018 09:23 PM   Modules accepted: Orders

## 2019-05-24 ENCOUNTER — Encounter (HOSPITAL_COMMUNITY): Payer: Self-pay | Admitting: Emergency Medicine

## 2019-05-24 ENCOUNTER — Emergency Department (HOSPITAL_COMMUNITY)
Admission: EM | Admit: 2019-05-24 | Discharge: 2019-05-24 | Disposition: A | Discharge status: Home / Self Care | Payer: No Typology Code available for payment source | Attending: Emergency Medicine | Admitting: Emergency Medicine

## 2019-05-24 ENCOUNTER — Emergency Department (HOSPITAL_COMMUNITY): Payer: No Typology Code available for payment source

## 2019-05-24 DIAGNOSIS — I4891 Unspecified atrial fibrillation: Secondary | ICD-10-CM | POA: Insufficient documentation

## 2019-05-24 DIAGNOSIS — S0990XA Unspecified injury of head, initial encounter: Secondary | ICD-10-CM

## 2019-05-24 DIAGNOSIS — Y92410 Unspecified street and highway as the place of occurrence of the external cause: Secondary | ICD-10-CM | POA: Diagnosis not present

## 2019-05-24 DIAGNOSIS — Z8673 Personal history of transient ischemic attack (TIA), and cerebral infarction without residual deficits: Secondary | ICD-10-CM | POA: Insufficient documentation

## 2019-05-24 DIAGNOSIS — S39012A Strain of muscle, fascia and tendon of lower back, initial encounter: Secondary | ICD-10-CM | POA: Insufficient documentation

## 2019-05-24 DIAGNOSIS — S161XXA Strain of muscle, fascia and tendon at neck level, initial encounter: Secondary | ICD-10-CM | POA: Insufficient documentation

## 2019-05-24 DIAGNOSIS — Z79899 Other long term (current) drug therapy: Secondary | ICD-10-CM | POA: Diagnosis not present

## 2019-05-24 DIAGNOSIS — Y939 Activity, unspecified: Secondary | ICD-10-CM | POA: Insufficient documentation

## 2019-05-24 DIAGNOSIS — Y999 Unspecified external cause status: Secondary | ICD-10-CM | POA: Insufficient documentation

## 2019-05-24 DIAGNOSIS — S301XXA Contusion of abdominal wall, initial encounter: Secondary | ICD-10-CM | POA: Diagnosis not present

## 2019-05-24 DIAGNOSIS — F1410 Cocaine abuse, uncomplicated: Secondary | ICD-10-CM | POA: Insufficient documentation

## 2019-05-24 DIAGNOSIS — F17210 Nicotine dependence, cigarettes, uncomplicated: Secondary | ICD-10-CM | POA: Diagnosis not present

## 2019-05-24 DIAGNOSIS — Z7982 Long term (current) use of aspirin: Secondary | ICD-10-CM | POA: Diagnosis not present

## 2019-05-24 DIAGNOSIS — J449 Chronic obstructive pulmonary disease, unspecified: Secondary | ICD-10-CM | POA: Insufficient documentation

## 2019-05-24 DIAGNOSIS — I10 Essential (primary) hypertension: Secondary | ICD-10-CM | POA: Insufficient documentation

## 2019-05-24 HISTORY — DX: Essential (primary) hypertension: I10

## 2019-05-24 HISTORY — DX: Cerebral infarction, unspecified: I63.9

## 2019-05-24 LAB — CBC WITH DIFFERENTIAL/PLATELET
Abs Immature Granulocytes: 0.02 10*3/uL (ref 0.00–0.07)
Basophils Absolute: 0 10*3/uL (ref 0.0–0.1)
Basophils Relative: 0 %
Eosinophils Absolute: 0.1 10*3/uL (ref 0.0–0.5)
Eosinophils Relative: 1 %
HCT: 46.2 % (ref 39.0–52.0)
Hemoglobin: 14.8 g/dL (ref 13.0–17.0)
Immature Granulocytes: 0 %
Lymphocytes Relative: 16 %
Lymphs Abs: 1.4 10*3/uL (ref 0.7–4.0)
MCH: 28.1 pg (ref 26.0–34.0)
MCHC: 32 g/dL (ref 30.0–36.0)
MCV: 87.7 fL (ref 80.0–100.0)
Monocytes Absolute: 0.8 10*3/uL (ref 0.1–1.0)
Monocytes Relative: 8 %
Neutro Abs: 6.8 10*3/uL (ref 1.7–7.7)
Neutrophils Relative %: 75 %
Platelets: 192 10*3/uL (ref 150–400)
RBC: 5.27 MIL/uL (ref 4.22–5.81)
RDW: 14.9 % (ref 11.5–15.5)
WBC: 9.1 10*3/uL (ref 4.0–10.5)
nRBC: 0 % (ref 0.0–0.2)

## 2019-05-24 LAB — BASIC METABOLIC PANEL
Anion gap: 10 (ref 5–15)
BUN: 9 mg/dL (ref 6–20)
CO2: 26 mmol/L (ref 22–32)
Calcium: 8.9 mg/dL (ref 8.9–10.3)
Chloride: 103 mmol/L (ref 98–111)
Creatinine, Ser: 1.12 mg/dL (ref 0.61–1.24)
GFR calc Af Amer: >60 mL/min (ref >60)
GFR calc non Af Amer: >60 mL/min (ref >60)
Glucose, Bld: 119 mg/dL — ABNORMAL HIGH (ref 70–99)
Potassium: 3.8 mmol/L (ref 3.5–5.1)
Sodium: 139 mmol/L (ref 135–145)

## 2019-05-24 LAB — ETHANOL: Alcohol, Ethyl (B): <10 mg/dL (ref <10)

## 2019-05-24 MED ORDER — TRAMADOL HCL 50 MG PO TABS: 50.0000 mg | ORAL_TABLET | Freq: Four times a day (QID) | ORAL | 0 refills | Status: DC | PRN

## 2019-05-24 MED ORDER — SODIUM CHLORIDE (PF) 0.9 % IJ SOLN
INTRAMUSCULAR | Status: AC
  Filled 2019-05-24: qty 50

## 2019-05-24 MED ORDER — IOHEXOL 300 MG/ML  SOLN
100.0000 mL | Freq: Once | INTRAMUSCULAR | Status: AC | PRN
  Administered 2019-05-24: 100 mL via INTRAVENOUS

## 2019-05-24 MED ORDER — TRAMADOL HCL 50 MG PO TABS
50.0000 mg | ORAL_TABLET | Freq: Once | ORAL | Status: AC
  Administered 2019-05-24: 50 mg via ORAL
  Filled 2019-05-24: qty 1

## 2019-05-24 MED ORDER — SODIUM CHLORIDE 0.9 % IV BOLUS
1000.0000 mL | Freq: Once | INTRAVENOUS | Status: AC
  Administered 2019-05-24: 18:00:00 1000 mL via INTRAVENOUS

## 2019-05-24 NOTE — ED Notes (Signed)
Patient transported to CT 
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
## 2019-05-24 NOTE — ED Notes (Signed)
Patient given urinal.

## 2019-05-24 NOTE — ED Notes (Signed)
Per MD, ok for patient to be d/c with c-collar.
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
## 2019-05-24 NOTE — ED Notes (Addendum)
Upon attempting to start IV on patient, patient began yelling at this RN.

## 2019-05-24 NOTE — Discharge Instructions (Addendum)
Begin taking tramadol as prescribed.  Continue other medications as previously prescribed.  Rest.  Follow-up with your primary doctor if symptoms or not improving in the next few days.

## 2019-06-21 ENCOUNTER — Ambulatory Visit (HOSPITAL_COMMUNITY): Payer: Medicaid Other

## 2019-06-21 ENCOUNTER — Emergency Department (HOSPITAL_COMMUNITY): Payer: Medicaid Other

## 2019-06-21 ENCOUNTER — Emergency Department (HOSPITAL_COMMUNITY)
Admission: EM | Admit: 2019-06-21 | Discharge: 2019-06-21 | Discharge status: Against Medical Advice | Payer: Medicaid Other | Attending: Emergency Medicine | Admitting: Emergency Medicine

## 2019-06-21 ENCOUNTER — Encounter (HOSPITAL_COMMUNITY): Payer: Self-pay | Admitting: Emergency Medicine

## 2019-06-21 ENCOUNTER — Other Ambulatory Visit: Payer: Self-pay

## 2019-06-21 DIAGNOSIS — Y998 Other external cause status: Secondary | ICD-10-CM | POA: Insufficient documentation

## 2019-06-21 DIAGNOSIS — I10 Essential (primary) hypertension: Secondary | ICD-10-CM | POA: Diagnosis not present

## 2019-06-21 DIAGNOSIS — Z8673 Personal history of transient ischemic attack (TIA), and cerebral infarction without residual deficits: Secondary | ICD-10-CM | POA: Diagnosis not present

## 2019-06-21 DIAGNOSIS — Y9355 Activity, bike riding: Secondary | ICD-10-CM | POA: Diagnosis not present

## 2019-06-21 DIAGNOSIS — M25562 Pain in left knee: Secondary | ICD-10-CM | POA: Diagnosis not present

## 2019-06-21 DIAGNOSIS — Y92410 Unspecified street and highway as the place of occurrence of the external cause: Secondary | ICD-10-CM | POA: Diagnosis not present

## 2019-06-21 DIAGNOSIS — F17210 Nicotine dependence, cigarettes, uncomplicated: Secondary | ICD-10-CM | POA: Diagnosis not present

## 2019-06-21 DIAGNOSIS — M545 Low back pain, unspecified: Secondary | ICD-10-CM

## 2019-06-21 DIAGNOSIS — J449 Chronic obstructive pulmonary disease, unspecified: Secondary | ICD-10-CM | POA: Diagnosis not present

## 2019-06-21 NOTE — ED Notes (Signed)
Pt transported to Xray. 
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
## 2019-06-21 NOTE — ED Notes (Signed)
Pt ambulatory out ED exit stating " I am tired of this" and left from Xray.

## 2019-06-21 NOTE — ED Notes (Signed)
Pt ambulatory out of ED. Tegler MD made aware.

## 2020-03-07 ENCOUNTER — Encounter (HOSPITAL_COMMUNITY): Payer: Self-pay | Admitting: Emergency Medicine

## 2020-03-07 ENCOUNTER — Inpatient Hospital Stay (HOSPITAL_COMMUNITY)
Admission: EM | Admit: 2020-03-07 | Discharge: 2020-03-12 | DRG: 308 | Disposition: A | Discharge status: Home / Self Care | Payer: Medicaid Other | Attending: Family Medicine | Admitting: Family Medicine

## 2020-03-07 ENCOUNTER — Emergency Department (HOSPITAL_COMMUNITY): Payer: Medicaid Other

## 2020-03-07 ENCOUNTER — Other Ambulatory Visit: Payer: Self-pay

## 2020-03-07 DIAGNOSIS — I4891 Unspecified atrial fibrillation: Secondary | ICD-10-CM

## 2020-03-07 DIAGNOSIS — E1165 Type 2 diabetes mellitus with hyperglycemia: Secondary | ICD-10-CM | POA: Diagnosis present

## 2020-03-07 DIAGNOSIS — I2699 Other pulmonary embolism without acute cor pulmonale: Secondary | ICD-10-CM

## 2020-03-07 DIAGNOSIS — I248 Other forms of acute ischemic heart disease: Secondary | ICD-10-CM | POA: Diagnosis present

## 2020-03-07 DIAGNOSIS — Z7982 Long term (current) use of aspirin: Secondary | ICD-10-CM

## 2020-03-07 DIAGNOSIS — Z8249 Family history of ischemic heart disease and other diseases of the circulatory system: Secondary | ICD-10-CM

## 2020-03-07 DIAGNOSIS — I110 Hypertensive heart disease with heart failure: Secondary | ICD-10-CM | POA: Diagnosis present

## 2020-03-07 DIAGNOSIS — D649 Anemia, unspecified: Secondary | ICD-10-CM

## 2020-03-07 DIAGNOSIS — I10 Essential (primary) hypertension: Secondary | ICD-10-CM

## 2020-03-07 DIAGNOSIS — I428 Other cardiomyopathies: Secondary | ICD-10-CM | POA: Diagnosis present

## 2020-03-07 DIAGNOSIS — J449 Chronic obstructive pulmonary disease, unspecified: Secondary | ICD-10-CM

## 2020-03-07 DIAGNOSIS — I4819 Other persistent atrial fibrillation: Principal | ICD-10-CM | POA: Diagnosis present

## 2020-03-07 DIAGNOSIS — Z7901 Long term (current) use of anticoagulants: Secondary | ICD-10-CM

## 2020-03-07 DIAGNOSIS — Z9119 Patient's noncompliance with other medical treatment and regimen: Secondary | ICD-10-CM

## 2020-03-07 DIAGNOSIS — I5043 Acute on chronic combined systolic (congestive) and diastolic (congestive) heart failure: Secondary | ICD-10-CM | POA: Diagnosis present

## 2020-03-07 DIAGNOSIS — Z20822 Contact with and (suspected) exposure to covid-19: Secondary | ICD-10-CM | POA: Diagnosis present

## 2020-03-07 DIAGNOSIS — Z79899 Other long term (current) drug therapy: Secondary | ICD-10-CM

## 2020-03-07 DIAGNOSIS — F17210 Nicotine dependence, cigarettes, uncomplicated: Secondary | ICD-10-CM | POA: Diagnosis present

## 2020-03-07 DIAGNOSIS — G4733 Obstructive sleep apnea (adult) (pediatric): Secondary | ICD-10-CM | POA: Diagnosis present

## 2020-03-07 DIAGNOSIS — Z8673 Personal history of transient ischemic attack (TIA), and cerebral infarction without residual deficits: Secondary | ICD-10-CM

## 2020-03-07 HISTORY — DX: Sleep apnea, unspecified: G47.30

## 2020-03-07 LAB — BASIC METABOLIC PANEL
Anion gap: 11 (ref 5–15)
BUN: 7 mg/dL (ref 6–20)
CO2: 25 mmol/L (ref 22–32)
Calcium: 8.4 mg/dL — ABNORMAL LOW (ref 8.9–10.3)
Chloride: 98 mmol/L (ref 98–111)
Creatinine, Ser: 0.93 mg/dL (ref 0.61–1.24)
GFR, Estimated: >60 mL/min (ref >60)
Glucose, Bld: 144 mg/dL — ABNORMAL HIGH (ref 70–99)
Potassium: 4.2 mmol/L (ref 3.5–5.1)
Sodium: 134 mmol/L — ABNORMAL LOW (ref 135–145)

## 2020-03-07 LAB — CBC
HCT: 36.8 % — ABNORMAL LOW (ref 39.0–52.0)
Hemoglobin: 11.7 g/dL — ABNORMAL LOW (ref 13.0–17.0)
MCH: 25.9 pg — ABNORMAL LOW (ref 26.0–34.0)
MCHC: 31.8 g/dL (ref 30.0–36.0)
MCV: 81.4 fL (ref 80.0–100.0)
Platelets: 241 10*3/uL (ref 150–400)
RBC: 4.52 MIL/uL (ref 4.22–5.81)
RDW: 18.1 % — ABNORMAL HIGH (ref 11.5–15.5)
WBC: 9.8 10*3/uL (ref 4.0–10.5)
nRBC: 0 % (ref 0.0–0.2)

## 2020-03-07 MED ORDER — DILTIAZEM HCL-DEXTROSE 125-5 MG/125ML-% IV SOLN (PREMIX): 5.0000 mg/h | INTRAVENOUS | Status: DC

## 2020-03-07 MED ORDER — DILTIAZEM LOAD VIA INFUSION
20.0000 mg | Freq: Once | INTRAVENOUS | Status: DC
  Filled 2020-03-07: qty 20

## 2020-03-07 NOTE — ED Triage Notes (Signed)
Patient here with complaint of generalized weakness for the last several days. Patient also requesting STD test after having unprotected sex.

## 2020-03-08 ENCOUNTER — Inpatient Hospital Stay (HOSPITAL_COMMUNITY): Payer: Medicaid Other

## 2020-03-08 ENCOUNTER — Encounter (HOSPITAL_COMMUNITY): Payer: Self-pay | Admitting: Family Medicine

## 2020-03-08 ENCOUNTER — Emergency Department (HOSPITAL_COMMUNITY): Payer: Medicaid Other

## 2020-03-08 DIAGNOSIS — I4819 Other persistent atrial fibrillation: Secondary | ICD-10-CM | POA: Diagnosis present

## 2020-03-08 DIAGNOSIS — I2699 Other pulmonary embolism without acute cor pulmonale: Secondary | ICD-10-CM

## 2020-03-08 DIAGNOSIS — F17210 Nicotine dependence, cigarettes, uncomplicated: Secondary | ICD-10-CM | POA: Diagnosis present

## 2020-03-08 DIAGNOSIS — I110 Hypertensive heart disease with heart failure: Secondary | ICD-10-CM | POA: Diagnosis present

## 2020-03-08 DIAGNOSIS — I4891 Unspecified atrial fibrillation: Secondary | ICD-10-CM

## 2020-03-08 DIAGNOSIS — J449 Chronic obstructive pulmonary disease, unspecified: Secondary | ICD-10-CM

## 2020-03-08 DIAGNOSIS — M7989 Other specified soft tissue disorders: Secondary | ICD-10-CM

## 2020-03-08 DIAGNOSIS — Z7901 Long term (current) use of anticoagulants: Secondary | ICD-10-CM | POA: Diagnosis not present

## 2020-03-08 DIAGNOSIS — I361 Nonrheumatic tricuspid (valve) insufficiency: Secondary | ICD-10-CM | POA: Diagnosis not present

## 2020-03-08 DIAGNOSIS — I248 Other forms of acute ischemic heart disease: Secondary | ICD-10-CM | POA: Diagnosis present

## 2020-03-08 DIAGNOSIS — I428 Other cardiomyopathies: Secondary | ICD-10-CM | POA: Diagnosis present

## 2020-03-08 DIAGNOSIS — Z8249 Family history of ischemic heart disease and other diseases of the circulatory system: Secondary | ICD-10-CM | POA: Diagnosis not present

## 2020-03-08 DIAGNOSIS — Z8673 Personal history of transient ischemic attack (TIA), and cerebral infarction without residual deficits: Secondary | ICD-10-CM | POA: Diagnosis not present

## 2020-03-08 DIAGNOSIS — I340 Nonrheumatic mitral (valve) insufficiency: Secondary | ICD-10-CM | POA: Diagnosis not present

## 2020-03-08 DIAGNOSIS — Z79899 Other long term (current) drug therapy: Secondary | ICD-10-CM | POA: Diagnosis not present

## 2020-03-08 DIAGNOSIS — I10 Essential (primary) hypertension: Secondary | ICD-10-CM

## 2020-03-08 DIAGNOSIS — Z20822 Contact with and (suspected) exposure to covid-19: Secondary | ICD-10-CM | POA: Diagnosis present

## 2020-03-08 DIAGNOSIS — Z7982 Long term (current) use of aspirin: Secondary | ICD-10-CM | POA: Diagnosis not present

## 2020-03-08 DIAGNOSIS — G4733 Obstructive sleep apnea (adult) (pediatric): Secondary | ICD-10-CM | POA: Diagnosis present

## 2020-03-08 DIAGNOSIS — Z9119 Patient's noncompliance with other medical treatment and regimen: Secondary | ICD-10-CM | POA: Diagnosis not present

## 2020-03-08 DIAGNOSIS — I5043 Acute on chronic combined systolic (congestive) and diastolic (congestive) heart failure: Secondary | ICD-10-CM | POA: Diagnosis present

## 2020-03-08 DIAGNOSIS — D649 Anemia, unspecified: Secondary | ICD-10-CM | POA: Diagnosis present

## 2020-03-08 DIAGNOSIS — E1165 Type 2 diabetes mellitus with hyperglycemia: Secondary | ICD-10-CM | POA: Diagnosis present

## 2020-03-08 LAB — ECHOCARDIOGRAM COMPLETE
AR max vel: 3.48 cm2
AV Area VTI: 3.1 cm2
AV Area mean vel: 3.19 cm2
AV Mean grad: 4.7 mmHg
AV Peak grad: 7.5 mmHg
Ao pk vel: 1.37 m/s
Calc EF: 43.6 %
MV M vel: 4.53 m/s
MV Peak grad: 82.1 mmHg
P 1/2 time: 449 msec
Radius: 0.6 cm
S' Lateral: 4.4 cm
Single Plane A2C EF: 48 %
Single Plane A4C EF: 40.9 %
Weight: 4320 oz

## 2020-03-08 LAB — URINALYSIS, ROUTINE W REFLEX MICROSCOPIC
Bacteria, UA: NONE SEEN
Glucose, UA: NEGATIVE mg/dL
Hgb urine dipstick: NEGATIVE
Ketones, ur: NEGATIVE mg/dL
Leukocytes,Ua: NEGATIVE
Nitrite: NEGATIVE
Protein, ur: 30 mg/dL — AB
Specific Gravity, Urine: 1.019 (ref 1.005–1.030)
pH: 6 (ref 5.0–8.0)

## 2020-03-08 LAB — TROPONIN I (HIGH SENSITIVITY)
Troponin I (High Sensitivity): 25 ng/L — ABNORMAL HIGH (ref <18)
Troponin I (High Sensitivity): 18 ng/L — ABNORMAL HIGH (ref <18)
Troponin I (High Sensitivity): 19 ng/L — ABNORMAL HIGH (ref <18)
Troponin I (High Sensitivity): 15 ng/L (ref <18)

## 2020-03-08 LAB — LIPID PANEL
Cholesterol: 86 mg/dL (ref 0–200)
HDL: 22 mg/dL — ABNORMAL LOW (ref >40)
LDL Cholesterol: 56 mg/dL (ref 0–99)
Total CHOL/HDL Ratio: 3.9 RATIO
Triglycerides: 40 mg/dL (ref <150)
VLDL: 8 mg/dL (ref 0–40)

## 2020-03-08 LAB — BASIC METABOLIC PANEL
Anion gap: 11 (ref 5–15)
BUN: 7 mg/dL (ref 6–20)
CO2: 25 mmol/L (ref 22–32)
Calcium: 8.4 mg/dL — ABNORMAL LOW (ref 8.9–10.3)
Chloride: 97 mmol/L — ABNORMAL LOW (ref 98–111)
Creatinine, Ser: 0.91 mg/dL (ref 0.61–1.24)
GFR, Estimated: >60 mL/min (ref >60)
Glucose, Bld: 146 mg/dL — ABNORMAL HIGH (ref 70–99)
Potassium: 4.2 mmol/L (ref 3.5–5.1)
Sodium: 133 mmol/L — ABNORMAL LOW (ref 135–145)

## 2020-03-08 LAB — CBC
HCT: 34.9 % — ABNORMAL LOW (ref 39.0–52.0)
Hemoglobin: 11.2 g/dL — ABNORMAL LOW (ref 13.0–17.0)
MCH: 25.8 pg — ABNORMAL LOW (ref 26.0–34.0)
MCHC: 32.1 g/dL (ref 30.0–36.0)
MCV: 80.4 fL (ref 80.0–100.0)
Platelets: 246 10*3/uL (ref 150–400)
RBC: 4.34 MIL/uL (ref 4.22–5.81)
RDW: 17.8 % — ABNORMAL HIGH (ref 11.5–15.5)
WBC: 9.3 10*3/uL (ref 4.0–10.5)
nRBC: 0 % (ref 0.0–0.2)

## 2020-03-08 LAB — GC/CHLAMYDIA PROBE AMP (~~LOC~~) NOT AT ARMC
Chlamydia: NEGATIVE
Comment: NEGATIVE
Comment: NORMAL
Neisseria Gonorrhea: NEGATIVE

## 2020-03-08 LAB — MAGNESIUM: Magnesium: 1.8 mg/dL (ref 1.7–2.4)

## 2020-03-08 LAB — RESP PANEL BY RT-PCR (FLU A&B, COVID) ARPGX2
Influenza A by PCR: NEGATIVE
Influenza B by PCR: NEGATIVE
SARS Coronavirus 2 by RT PCR: NEGATIVE

## 2020-03-08 LAB — HIV ANTIBODY (ROUTINE TESTING W REFLEX): HIV Screen 4th Generation wRfx: NONREACTIVE

## 2020-03-08 LAB — BRAIN NATRIURETIC PEPTIDE: B Natriuretic Peptide: 334.2 pg/mL — ABNORMAL HIGH (ref 0.0–100.0)

## 2020-03-08 LAB — MRSA PCR SCREENING: MRSA by PCR: POSITIVE — AB

## 2020-03-08 LAB — DIGOXIN LEVEL: Digoxin Level: 0.4 ng/mL — ABNORMAL LOW (ref 0.8–2.0)

## 2020-03-08 MED ORDER — FUROSEMIDE 10 MG/ML IJ SOLN
40.0000 mg | Freq: Two times a day (BID) | INTRAMUSCULAR | Status: DC
  Administered 2020-03-08 – 2020-03-11 (×7): 40 mg via INTRAVENOUS
  Filled 2020-03-08 (×7): qty 4

## 2020-03-08 MED ORDER — AMIODARONE HCL 200 MG PO TABS
200.0000 mg | ORAL_TABLET | Freq: Every day | ORAL | Status: DC
Start: 2020-03-08 — End: 2020-03-12
  Administered 2020-03-08 – 2020-03-12 (×5): 200 mg via ORAL
  Filled 2020-03-08 (×5): qty 1

## 2020-03-08 MED ORDER — ALBUTEROL SULFATE HFA 108 (90 BASE) MCG/ACT IN AERS
2.0000 | INHALATION_SPRAY | RESPIRATORY_TRACT | Status: DC | PRN
  Filled 2020-03-08: qty 6.7

## 2020-03-08 MED ORDER — DIGOXIN 125 MCG PO TABS
125.0000 ug | ORAL_TABLET | Freq: Every day | ORAL | Status: DC
  Administered 2020-03-08: 125 ug via ORAL
  Filled 2020-03-08: qty 1

## 2020-03-08 MED ORDER — DILTIAZEM LOAD VIA INFUSION
10.0000 mg | Freq: Once | INTRAVENOUS | Status: AC
  Administered 2020-03-08: 10 mg via INTRAVENOUS
  Filled 2020-03-08: qty 10

## 2020-03-08 MED ORDER — ATORVASTATIN CALCIUM 10 MG PO TABS
20.0000 mg | ORAL_TABLET | Freq: Every day | ORAL | Status: DC
  Administered 2020-03-08 – 2020-03-12 (×5): 20 mg via ORAL
  Filled 2020-03-08 (×6): qty 2

## 2020-03-08 MED ORDER — ACETAMINOPHEN 325 MG PO TABS: 650.0000 mg | ORAL_TABLET | ORAL | Status: DC | PRN

## 2020-03-08 MED ORDER — DILTIAZEM HCL-DEXTROSE 125-5 MG/125ML-% IV SOLN (PREMIX)
5.0000 mg/h | INTRAVENOUS | Status: DC
  Administered 2020-03-08: 15 mg/h via INTRAVENOUS
  Administered 2020-03-08: 5 mg/h via INTRAVENOUS
  Administered 2020-03-08: 15 mg/h via INTRAVENOUS
  Administered 2020-03-09: 10 mg/h via INTRAVENOUS
  Filled 2020-03-08 (×4): qty 125

## 2020-03-08 MED ORDER — MAGNESIUM SULFATE 2 GM/50ML IV SOLN
2.0000 g | Freq: Once | INTRAVENOUS | Status: AC
  Administered 2020-03-08: 2 g via INTRAVENOUS
  Filled 2020-03-08: qty 50

## 2020-03-08 MED ORDER — CHLORHEXIDINE GLUCONATE CLOTH 2 % EX PADS
6.0000 | MEDICATED_PAD | Freq: Every day | CUTANEOUS | Status: DC
  Administered 2020-03-09 – 2020-03-12 (×4): 6 via TOPICAL

## 2020-03-08 MED ORDER — FUROSEMIDE 20 MG PO TABS
20.0000 mg | ORAL_TABLET | Freq: Every day | ORAL | Status: DC
  Administered 2020-03-08: 20 mg via ORAL
  Filled 2020-03-08: qty 1

## 2020-03-08 MED ORDER — LACTATED RINGERS IV SOLN: INTRAVENOUS | Status: DC

## 2020-03-08 MED ORDER — CARVEDILOL 6.25 MG PO TABS
6.2500 mg | ORAL_TABLET | Freq: Two times a day (BID) | ORAL | Status: DC
  Administered 2020-03-08 – 2020-03-09 (×2): 6.25 mg via ORAL
  Filled 2020-03-08 (×2): qty 1

## 2020-03-08 MED ORDER — MELATONIN 3 MG PO TABS
3.0000 mg | ORAL_TABLET | Freq: Every evening | ORAL | Status: DC | PRN
  Administered 2020-03-08: 3 mg via ORAL
  Filled 2020-03-08: qty 1

## 2020-03-08 MED ORDER — APIXABAN 5 MG PO TABS: 10.0000 mg | ORAL_TABLET | Freq: Two times a day (BID) | ORAL | Status: DC

## 2020-03-08 MED ORDER — ENOXAPARIN SODIUM 120 MG/0.8ML ~~LOC~~ SOLN
120.0000 mg | Freq: Two times a day (BID) | SUBCUTANEOUS | Status: DC
  Administered 2020-03-08 – 2020-03-11 (×6): 120 mg via SUBCUTANEOUS
  Filled 2020-03-08 (×7): qty 0.8

## 2020-03-08 MED ORDER — IOHEXOL 350 MG/ML SOLN
100.0000 mL | Freq: Once | INTRAVENOUS | Status: AC | PRN
  Administered 2020-03-08: 100 mL via INTRAVENOUS

## 2020-03-08 MED ORDER — SPIRONOLACTONE 25 MG PO TABS
25.0000 mg | ORAL_TABLET | Freq: Every day | ORAL | Status: DC
Start: 2020-03-08 — End: 2020-03-12
  Administered 2020-03-08 – 2020-03-12 (×5): 25 mg via ORAL
  Filled 2020-03-08 (×5): qty 1

## 2020-03-08 MED ORDER — HEPARIN (PORCINE) 25000 UT/250ML-% IV SOLN
1400.0000 [IU]/h | INTRAVENOUS | Status: DC
  Administered 2020-03-08: 1400 [IU]/h via INTRAVENOUS
  Filled 2020-03-08: qty 250

## 2020-03-08 MED ORDER — MUPIROCIN 2 % EX OINT
1.0000 "application " | TOPICAL_OINTMENT | Freq: Two times a day (BID) | CUTANEOUS | Status: DC
  Administered 2020-03-09 – 2020-03-12 (×6): 1 via NASAL
  Filled 2020-03-08 (×2): qty 22

## 2020-03-08 MED ORDER — ONDANSETRON HCL 4 MG/2ML IJ SOLN: 4.0000 mg | Freq: Four times a day (QID) | INTRAMUSCULAR | Status: DC | PRN

## 2020-03-08 MED ORDER — MOMETASONE FURO-FORMOTEROL FUM 100-5 MCG/ACT IN AERO
2.0000 | INHALATION_SPRAY | Freq: Two times a day (BID) | RESPIRATORY_TRACT | Status: DC
  Administered 2020-03-08 – 2020-03-12 (×7): 2 via RESPIRATORY_TRACT
  Filled 2020-03-08: qty 8.8

## 2020-03-08 MED ORDER — HEPARIN BOLUS VIA INFUSION
3000.0000 [IU] | Freq: Once | INTRAVENOUS | Status: DC
  Administered 2020-03-08: 3000 [IU] via INTRAVENOUS
  Filled 2020-03-08: qty 3000

## 2020-03-08 MED ORDER — ASPIRIN EC 81 MG PO TBEC
81.0000 mg | DELAYED_RELEASE_TABLET | Freq: Every day | ORAL | Status: DC
  Administered 2020-03-08 – 2020-03-11 (×4): 81 mg via ORAL
  Filled 2020-03-08 (×4): qty 1

## 2020-03-08 NOTE — ED Provider Notes (Signed)
[facility] EMERGENCY DEPARTMENT Provider Note   CSN: [id number] Arrival date & time: 03/07/20  1623     History Chief Complaint  Patient presents with  . Shortness of Breath    Donald [name] is a 60 y.o. male.  Patient is a 60 year old male with past medical history of COPD, hypertension, atrial fibrillation, and prior stroke.  Patient presents today for evaluation of generalized weakness, shortness of breath worsening over the past several days.  Patient has low-grade fever in triage of 100.2.  He denies chest pain.  He denies any known Covid exposures.  He has had his vaccinations.  Patient also reports having unprotected sex recently and is concerned about the possibility of STD.  He denies burning with urination.  The history is provided by the patient.  Shortness of Breath Severity:  Moderate Onset quality:  Gradual Duration:  3 days Timing:  Constant Progression:  Worsening Chronicity:  New Context: activity   Relieved by:  Nothing Worsened by:  Nothing Ineffective treatments:  None tried      Past Medical History:  Diagnosis Date  . Atrial fibrillation (HCC)   . COPD (chronic obstructive pulmonary disease) (HCC)   . Hypertension   . Stroke [facility])     Patient Active Problem List   Diagnosis Date Noted  . COPD exacerbation (HCC) 11/16/2014  . Atypical chest pain 11/16/2014  . 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 Paroxysmal atrial fibrillation (HCC) 11/16/2014  . Acute bronchitis 11/16/2014  . Abnormal EKG 11/16/2014  . Prediabetes 11/17/2012  . Perirectal abscess 10/20/2012    Past Surgical History:  Procedure Laterality Date  . ABLATION    . INCISION AND DRAINAGE PERIRECTAL ABSCESS N/A 10/20/2012   Procedure: IRRIGATION AND DEBRIDEMENT PERIRECTAL ABSCESS;  Surgeon: Wilmon Arms. Corliss Skains, MD;  Location: [facility] OR;  Service: General;  Laterality: N/A;  Lithotomy       No family history on file.  Social History   Tobacco Use  .  Smoking status: Current Some Day Smoker    Packs/day: 0.50  . Smokeless tobacco: Never Used  Substance Use Topics  . Alcohol use: Yes  . Drug use: No    Types: Cocaine    Home Medications Prior to Admission medications   Medication Sig Start Date End Date Taking? Authorizing Provider  acyclovir (ZOVIRAX) 200 MG capsule Take 4 capsules (800 mg total) by mouth 3 (three) times daily. Patient not taking: Reported on 05/19/2016 11/16/14   Joseph Art, DO  albuterol (PROVENTIL HFA;VENTOLIN HFA) 108 (90 Base) MCG/ACT inhaler Inhale 2 puffs into the lungs every 4 (four) hours as needed for shortness of breath. 02/07/16 02/06/17  [provider]  albuterol (PROVENTIL) (2.5 MG/3ML) 0.083% nebulizer solution Take 3 mLs (2.5 mg total) by nebulization every 4 (four) hours as needed for wheezing or shortness of breath. Patient not taking: Reported on 05/19/2016 10/07/15   Rolan Bucco, MD  aspirin 81 MG chewable tablet Chew 81 mg by mouth daily. 02/08/16   [provider]  doxycycline (VIBRAMYCIN) 100 MG capsule Take 1 capsule (100 mg total) by mouth 2 (two) times daily. One po bid x 10 days 05/19/16   Doug Sou, MD  lisinopril (PRINIVIL,ZESTRIL) 10 MG tablet Take 20 mg by mouth 2 (two) times daily.  02/28/16 02/27/17  [provider]  lovastatin (MEVACOR) 10 MG tablet Take 10 mg by mouth daily. 02/07/16   [provider]  metoprolol tartrate (LOPRESSOR) 25 MG tablet Take 25  mg by mouth 2 (two) times daily. 02/07/16   [provider]  oxyCODONE-acetaminophen (PERCOCET) 5-325 MG tablet Take 1-2 tablets by mouth every 6 (six) hours as needed. 05/19/16   Doug Sou, MD  traMADol (ULTRAM) 50 MG tablet Take 1 tablet (50 mg total) by mouth every 6 (six) hours as needed. 05/24/19   Geoffery Lyons, MD    Allergies    Patient has no known allergies.  Review of Systems   Review of Systems  Respiratory: Positive for shortness of breath.   All other systems  reviewed and are negative.   Physical Exam Updated Vital Signs BP (!) 160/128 (BP Location: Right Arm)   Pulse 79   Temp 100 F (37.8 C) (Oral)   Resp 17   SpO2 97%   Physical Exam Vitals and nursing note reviewed.  Constitutional:      General: He is not in acute distress.    Appearance: He is well-developed and well-nourished. He is not diaphoretic.  HENT:     Head: Normocephalic and atraumatic.     Mouth/Throat:     Mouth: Oropharynx is clear and moist.  Cardiovascular:     Rate and Rhythm: Normal rate and regular rhythm.     Heart sounds: No murmur heard. No friction rub.  Pulmonary:     Effort: Pulmonary effort is normal. No respiratory distress.     Breath sounds: Examination of the right-lower field reveals rales. Examination of the left-lower field reveals rales. Rales present. No wheezing.  Abdominal:     General: Bowel sounds are normal. There is no distension.     Palpations: Abdomen is soft.     Tenderness: There is no abdominal tenderness.  Musculoskeletal:        General: Normal range of motion.     Cervical back: Normal range of motion and neck supple.     Right lower leg: No tenderness. Edema present.     Left lower leg: No tenderness. Edema present.     Comments: There is trace nonpitting edema of both lower extremities.  Skin:    General: Skin is warm and dry.  Neurological:     Mental Status: He is alert and oriented to person, place, and time.     Coordination: Coordination normal.     ED Results / Procedures / Treatments   Labs (all labs ordered are listed, but only abnormal results are displayed) Labs Reviewed  BASIC METABOLIC PANEL - Abnormal; Notable for the following components:      Result Value   Sodium 134 (*)    Glucose, Bld 144 (*)    Calcium 8.4 (*)    All other components within normal limits  CBC - Abnormal; Notable for the following components:   Hemoglobin 11.7 (*)    HCT 36.8 (*)    MCH 25.9 (*)    RDW 18.1 (*)    All  other components within normal limits  RESP PANEL BY RT-PCR (FLU A&B, COVID) ARPGX2  BRAIN NATRIURETIC PEPTIDE  URINALYSIS, ROUTINE W REFLEX MICROSCOPIC  HIV ANTIBODY (ROUTINE TESTING W REFLEX)  GC/CHLAMYDIA PROBE AMP ([place]) NOT AT [facility]  TROPONIN I (HIGH SENSITIVITY)    EKG EKG Interpretation  Date/Time:  Thursday March 07 2020 16:35:11 EST Ventricular Rate:  151 PR Interval:    QRS Duration: 92 QT Interval:  280 QTC Calculation: 443 R Axis:   87 Text Interpretation: Atrial fibrillation with rapid ventricular response ST & T wave abnormality, consider inferolateral ischemia Confirmed  by Cathren Laine ([id number]) on 03/07/2020 5:29:26 PM   Radiology DG Chest 2 View  Result Date: 03/07/2020 CLINICAL DATA:  Chest pain and shortness of breath. EXAM: CHEST - 2 VIEW COMPARISON:  Chest x-ray dated January 16, 2020. FINDINGS: Unchanged mild cardiomegaly. New pulmonary vascular congestion and diffuse interstitial thickening with small right pleural effusion. Mild right basilar atelectasis. No pneumothorax. No acute osseous abnormality. IMPRESSION: 1. New mild congestive heart failure. Electronically Signed   By: Obie Dredge M.D.   On: 03/07/2020 17:01    Procedures Procedures   Medications Ordered in ED Medications  diltiazem (CARDIZEM) 1 mg/mL load via infusion 20 mg (has no administration in time range)    And  diltiazem (CARDIZEM) 125 mg in dextrose 5% 125 mL (1 mg/mL) infusion (has no administration in time range)    ED Course  I have reviewed the triage vital signs and the nursing notes.  Pertinent labs & imaging results that were available during my care of the patient were reviewed by me and considered in my medical decision making (see chart for details).    MDM Rules/Calculators/A&P  Patient presenting here with a 2-day history of chest tightness, cough, feeling short of breath, and weak.  He arrives here with low-grade fever, but all adequate oxygen sats.   Initial EKG shows atrial fibrillation with RVR.  Patient started on Cardizem for rate control.  Chest x-ray is suspicious for CHF versus infiltrate.  A CT scan was obtained which shows bilateral pulmonary emboli.  Patient has been on Eliquis in the past, however has been somewhat noncompliant.  Patient to be started on Eliquis at the request of Dr. Rachael Darby and will be admitted to the hospitalist service.  CRITICAL CARE Performed by: Geoffery Lyons Total critical care time: 35 minutes Critical care time was exclusive of separately billable procedures and treating other patients. Critical care was necessary to treat or prevent imminent or life-threatening deterioration. Critical care was time spent personally by me on the following activities: development of treatment plan with patient and/or surrogate as well as nursing, discussions with consultants, evaluation of patient's response to treatment, examination of patient, obtaining history from patient or surrogate, ordering and performing treatments and interventions, ordering and review of laboratory studies, ordering and review of radiographic studies, pulse oximetry and re-evaluation of patient's condition.   Final Clinical Impression(s) / ED Diagnoses Final diagnoses:  None    Rx / DC Orders ED Discharge Orders    None       Geoffery Lyons, MD 03/08/20 [phone]

## 2020-03-08 NOTE — ED Notes (Signed)
Patient eating breakfast at this time.

## 2020-03-08 NOTE — ED Notes (Signed)
Pt given [place] sandwich with medications.

## 2020-03-08 NOTE — Plan of Care (Signed)

## 2020-03-08 NOTE — H&P (Signed)
History and Physical    Donald [name] SAY:[id number] DOB: 1960/06/25 DOA: 03/07/2020  PCP: Renaye Rakers, MD   Patient coming from: Home  Chief Complaint: SOB, weakness  HPI: Donald [name] is a 60 y.o. male with medical history significant for COPD, hypertension, atrial fibrillation, and history of stroke.  Patient presents with complaint of generalized weakness, shortness of breath that has been worsening over the past several days.  Patient has low-grade fever of 100.2 F when he arrived.  He denies chest pain. He does report feeling like his heart is beating fast. He has been converted in the past for a-fib. According to records he has not always been compliant with his anticoagulation but he states he has been taking eliquis twice a day as prescribed to him.  He denies any known Covid exposures.  He has had his Covid vaccinations.  Patient also reports having unprotected sex recently and is concerned about the possibility of STD.  He denies burning with urination, frequent urination or pelvic pain. He denies any penile discharge.   ED Course:  He is found to be in a-fib with RVR with HR to 150-160 range. He was started on cardizem infusion. He has small bilateral PE's on CTA chest. Hospitalist service has been asked to admit for further management  Review of Systems:  General: Reports generalized weakness with low grade fever. Denies weight loss, night sweats.  Denies dizziness.  Denies change in appetite HENT: Denies head trauma, headache, denies change in hearing, tinnitus.  Denies nasal congestion or bleeding.  Denies sore throat, sores in mouth.  Denies difficulty swallowing Eyes: Denies blurry vision, pain in eye, drainage.  Denies discoloration of eyes. Neck: Denies pain.  Denies swelling.  Denies pain with movement. Cardiovascular: Reports palpitations. Denies chest pain. Reports edema legs.  Denies orthopnea Respiratory: Reports shortness of breath. Denies cough.  Denies wheezing.   Denies sputum production Gastrointestinal: Denies abdominal pain, swelling.  Denies nausea, vomiting, diarrhea.  Denies melena.  Denies hematemesis. Musculoskeletal: Denies limitation of movement.  Denies deformity or swelling. Denies arthralgias or myalgias. Genitourinary: Denies pelvic pain.  Denies urinary frequency or hesitancy.  Denies dysuria.  Skin: Denies rash.  Denies petechiae, purpura, ecchymosis. Neurological: Denies headache. Denies syncope. Denies seizure activity. Denies slurred speech, drooping face.  Denies visual change. Psychiatric: Denies depression, anxiety. Denies hallucinations.  Past Medical History:  Diagnosis Date  . Atrial fibrillation (HCC)   . COPD (chronic obstructive pulmonary disease) (HCC)   . Hypertension   . Stroke [facility])     Past Surgical History:  Procedure Laterality Date  . ABLATION    . INCISION AND DRAINAGE PERIRECTAL ABSCESS N/A 10/20/2012   Procedure: IRRIGATION AND DEBRIDEMENT PERIRECTAL ABSCESS;  Surgeon: Wilmon Arms. Corliss Skains, MD;  Location: [facility] OR;  Service: General;  Laterality: N/A;  Lithotomy    Social History  reports that he has been smoking. He has been smoking about 0.50 packs per day. He has never used smokeless tobacco. He reports current alcohol use. He reports that he does not use drugs.  No Known Allergies  History reviewed. No pertinent family history.   Prior to Admission medications   Medication Sig Start Date End Date Taking? Authorizing Provider  acyclovir (ZOVIRAX) 200 MG capsule Take 4 capsules (800 mg total) by mouth 3 (three) times daily. Patient not taking: Reported on 05/19/2016 11/16/14   Joseph Art, DO  albuterol (PROVENTIL HFA;VENTOLIN HFA) 108 (90 Base) MCG/ACT inhaler Inhale 2 puffs into the lungs every  4 (four) hours as needed for shortness of breath. 02/07/16 02/06/17  [provider]  albuterol (PROVENTIL) (2.5 MG/3ML) 0.083% nebulizer solution Take 3 mLs (2.5 mg total) by nebulization every 4 (four)  hours as needed for wheezing or shortness of breath. Patient not taking: Reported on 05/19/2016 10/07/15   Rolan Bucco, MD  aspirin 81 MG chewable tablet Chew 81 mg by mouth daily. 02/08/16   [provider]  doxycycline (VIBRAMYCIN) 100 MG capsule Take 1 capsule (100 mg total) by mouth 2 (two) times daily. One po bid x 10 days 05/19/16   Doug Sou, MD  lisinopril (PRINIVIL,ZESTRIL) 10 MG tablet Take 20 mg by mouth 2 (two) times daily.  02/28/16 02/27/17  [provider]  lovastatin (MEVACOR) 10 MG tablet Take 10 mg by mouth daily. 02/07/16   [provider]  metoprolol tartrate (LOPRESSOR) 25 MG tablet Take 25 mg by mouth 2 (two) times daily. 02/07/16   [provider]  oxyCODONE-acetaminophen (PERCOCET) 5-325 MG tablet Take 1-2 tablets by mouth every 6 (six) hours as needed. 05/19/16   Doug Sou, MD  traMADol (ULTRAM) 50 MG tablet Take 1 tablet (50 mg total) by mouth every 6 (six) hours as needed. 05/24/19   Geoffery Lyons, MD    Physical Exam: Vitals:   03/08/20 0205 03/08/20 0215 03/08/20 0230 03/08/20 0243  BP:  (!) 139/117 (!) 145/114   Pulse: (!) 153 (!) 129 (!) 139 (!) 129  Resp: (!) 26 (!) 33 (!) 31 (!) 28  Temp:      TempSrc:      SpO2: 97% 96% 95% 95%    Constitutional: NAD, calm, comfortable Vitals:   03/08/20 0205 03/08/20 0215 03/08/20 0230 03/08/20 0243  BP:  (!) 139/117 (!) 145/114   Pulse: (!) 153 (!) 129 (!) 139 (!) 129  Resp: (!) 26 (!) 33 (!) 31 (!) 28  Temp:      TempSrc:      SpO2: 97% 96% 95% 95%   General: WDWN, Alert and oriented x3.  Eyes: EOMI, PERRL, conjunctivae normal.  Sclera nonicteric HENT:  [place]/AT, external ears normal.  Nares patent without epistasis.  Mucous membranes are moist.  Neck: Soft, normal range of motion, supple, no masses, no thyromegaly. Trachea midline Respiratory: Equal but diminished breath sounds with bibasilar rales, no wheezing, no crackles. Normal respiratory effort. No accessory muscle  use.  Cardiovascular: Irregularly irregular rhythm with tachycardia, no murmurs / rubs / gallops. +1 lower extremity edema. 2+ pedal pulses. Abdomen: Soft, no tenderness, nondistended, no rebound or guarding.  No masses palpated. Bowel sounds normoactive Musculoskeletal: FROM. nocyanosis. No joint deformity upper and lower extremities. Normal muscle tone.  Skin: Warm, dry, intact no rashes, lesions, ulcers. No induration Neurologic: CN 2-12 grossly intact.  Normal speech.  Sensation intact, Strength 5/5 in all extremities.   Psychiatric:  Normal mood.  CHA2DS2-VASc score is 3   Labs on Admission: I have personally reviewed following labs and imaging studies  CBC: Recent Labs  Lab 03/07/20 1642  WBC 9.8  HGB 11.7*  HCT 36.8*  MCV 81.4  PLT 241    Basic Metabolic Panel: Recent Labs  Lab 03/07/20 1642  NA 134*  K 4.2  CL 98  CO2 25  GLUCOSE 144*  BUN 7  CREATININE 0.93  CALCIUM 8.4*    GFR: CrCl cannot be calculated (Unknown ideal weight.).  Liver Function Tests: No results for input(s): AST, ALT, ALKPHOS, BILITOT, PROT, ALBUMIN in the last 168 hours.  Urine analysis:    Component Value Date/Time   COLORURINE AMBER (A) 03/08/2020 0106   APPEARANCEUR CLEAR 03/08/2020 0106   LABSPEC 1.019 03/08/2020 0106   PHURINE 6.0 03/08/2020 0106   GLUCOSEU NEGATIVE 03/08/2020 0106   HGBUR NEGATIVE 03/08/2020 0106   BILIRUBINUR SMALL (A) 03/08/2020 0106   KETONESUR NEGATIVE 03/08/2020 0106   PROTEINUR 30 (A) 03/08/2020 0106   UROBILINOGEN 1.0 10/20/2012 1340   NITRITE NEGATIVE 03/08/2020 0106   LEUKOCYTESUR NEGATIVE 03/08/2020 0106    Radiological Exams on Admission: DG Chest 2 View  Result Date: 03/07/2020 CLINICAL DATA:  Chest pain and shortness of breath. EXAM: CHEST - 2 VIEW COMPARISON:  Chest x-ray dated January 16, 2020. FINDINGS: Unchanged mild cardiomegaly. New pulmonary vascular congestion and diffuse interstitial thickening with small right pleural effusion.  Mild right basilar atelectasis. No pneumothorax. No acute osseous abnormality. IMPRESSION: 1. New mild congestive heart failure. Electronically Signed   By: Obie Dredge M.D.   On: 03/07/2020 17:01   CT Angio Chest PE W and/or Wo Contrast  Result Date: 03/08/2020 CLINICAL DATA:  Generalized weakness EXAM: CT ANGIOGRAPHY CHEST WITH CONTRAST TECHNIQUE: Multidetector CT imaging of the chest was performed using the standard protocol during bolus administration of intravenous contrast. Multiplanar CT image reconstructions and MIPs were obtained to evaluate the vascular anatomy. CONTRAST:  OMNIPAQUE IOHEXOL 350 MG/ML SOLN COMPARISON:  None. FINDINGS: Cardiovascular: There is a optimal opacification of the pulmonary arteries. There is partially occlusive thrombus seen in the posterior right lower lobe and right middle lobe segmental and subsegmental arterial branches. There is also partially occlusive thrombus in the left lower lobe subsegmental arterial branches. There is mild cardiomegaly. No pericardial effusion or thickening. No evidence right heart strain. There is normal three-vessel brachiocephalic anatomy without proximal stenosis. Scattered aortic atherosclerosis is noted. Mediastinum/Nodes: No hilar, mediastinal, or axillary adenopathy. Thyroid gland, trachea, and esophagus demonstrate no significant findings. Lungs/Pleura: A small right pleural effusion is present. There is patchy airspace opacity seen at the right lung base with ground-glass opacities. There is also a small amount of ground-glass opacity seen at the left lung base. Upper Abdomen: No acute abnormalities present in the visualized portions of the upper abdomen. Musculoskeletal: No chest wall abnormality. No acute or significant osseous findings. Review of the MIP images confirms the above findings. IMPRESSION: Partially occlusive thrombus within segmental and subsegmental arterial branches within the bilateral lungs as described  above. No evidence of right ventricular heart strain. Small right pleural effusion Bilateral patchy ground-glass opacity seen within both lung bases right greater than left which could be due to atelectasis, or infectious etiology. Aortic Atherosclerosis (ICD10-I70.0). These results were called by telephone at the time of interpretation on 03/08/2020 at 1:56 am to provider Geoffery Lyons , who verbally acknowledged these results. Electronically Signed   By: Jonna Clark M.D.   On: 03/08/2020 02:00    EKG: Independently reviewed. KG shows atrial fibrillation with RVR. No acute ST elevation or depression. Nonspecific ST changes are noted. QTc is 417  Assessment/Plan Principal Problem:   Atrial fibrillation with RVR Donald [name] is admitted to cardiac telemetry floor.  Check serial troponin levels. Check echocardiogram in the morning. Placed on a Cardizem infusion which will be continued. Patient be converted to p.o. Cardizem in the morning if heart rate is controlled on infusion. Will monitor blood pressure.  If Cardizem does not control heart rate will convert to amiodarone Patient reports he has been taking his Eliquis and last took it Thursday  morning. He did miss the Thursday evening dose as he was in the emergency room.  Patient is placed on Lovenox 1 mg/kg subcu twice daily for anticoagulation. Patient's records indicate that he has had suboptimal compliance with anticoagulation in the past  Active Problems:   Pulmonary embolism  Patient is anticoagulated with Lovenox as above    Essential hypertension Medications need to be verified and reconciled by pharmacy and then resumed. Medications listed at this time in the chart are from a few years ago but are not accurate per pharmacist.    COPD (chronic obstructive pulmonary disease)  Albuterol MDI with spacer as needed for shortness of breath, wheeze Medications will need to be verified and reconciled by pharmacy     DVT  prophylaxis: Patient has been on Eliquis but has developed PEs. Is unknown how compliant he has been with Eliquis. According to previous records he has had issues with compliance in the past. Patient is placed on Lovenox 1 mg/kg twice daily for anticoagulation Code Status:   Full code Family Communication:  Diagnosis and plan discussed with patient. Patient verbalized understanding agrees with plan. Further recommendations to follow as clinical indicated Disposition Plan:   Patient is from:  Home  Anticipated DC to:  Home  Anticipated DC date:  Anticipate two midnight or more stay in the hospital to treat acute condition  Anticipated DC barriers: No barriers to discharge identified at this time  Admission status:  Inpatient  Claudean Severance Nikolaj Geraghty MD [facility]  How to contact the [facility] Attending or Consulting provider 7A - 7P or covering provider during after hours 7P -7A, for this patient?   1. Check the care team in [facility] and look for a) attending/consulting [facility] provider listed and b) the [facility] team listed 2. Log into [email or web address] and use [facility]'s universal password to access. If you do not have the password, please contact the hospital operator. 3. Locate the [facility] provider you are looking for under [facility] and page to a number that you can be directly reached. 4. If you still have difficulty reaching the provider, please page the [facility] (Director on Call) for the Hospitalists listed on amion for assistance.  03/08/2020, 3:29 AM

## 2020-03-08 NOTE — ED Notes (Signed)
Pt resting quietly in bed. No needs at this time

## 2020-03-08 NOTE — ED Notes (Signed)
Breakfast ordered 

## 2020-03-08 NOTE — Consult Note (Addendum)
Cardiology Consultation:   Patient ID: OWAIS PRUETT MRN: [id number]; DOB: July 10, 1960  Admit date: 03/07/2020 Date of Consult: 03/08/2020  PCP:  Renaye Rakers, MD   [facility]  Cardiologist:  [facility] (Dr. Rudolpho Sevin) Electrophysiologist:  None   Patient Profile:   LION FERNANDEZ is a 60 y.o. male with a history of chronic systolic CHF with EF of 40-45%, persistent atrial fibrillation/paroxysmal atrial flutter s/p flutter ablation on Eliquis 5mg  twice daily, CVA in 2018 when non-compliant with anticoagulation, COPD, obstructive sleep apnea not on CPAP, hypertension, hyperlipidemia, morbid obesity, medication non-compliance, and former tobacco abuse (quit in 2019 or 2020) who is being seen today for the evaluation of atrial fibrillation at the request of Dr. Alanda Slim.  History of Present Illness:   Mr. Holdren is a 60 year old male with the above history who is followed by Dr. Rudolpho Sevin at [facility] for his cardiac care. He has a history of medication non-compliance. He has had 2 recent hospitalization at [facility] for acute CHF exacerbation, on one in 11/2019 and one in 12/2019. Atrial fibrillation with RVR likely played a role in both exacerbation. Echo during admission in 11/2019 showed LVEF of 40-45% with severe LVH, biatrial enlargement, and possible left atrial thrombus vs myxoma. Cardiology was consulted who felt that it was a thrombus given he reported being non-compliant with his anticoagulation. EF down from 55-60% in 2018. Reduction in EF felt to be due to medication non-compliance. After last discharged, he was discharged on Digoxin, Amiodarone, Toprol-XL, Losartan, Spironolactone, Lasix, and Eliquis. Patient recently seen by Dr. Rudolpho Sevin for follow-up on 01/30/2020 at which time Digoxin was discontinued. Plan was for outpatient DCCV if he could be compliant with anticoagulation for 1.5 months. Importance of medication compliance was  emphasized.  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 Per notes in Care Everywhere, Myoview in 11/2016 showed no evidence of ischemia or infarction.  Patient presented to the ED yesterday for further evaluation of generalized weakness. Patient reports just a general since of unwellness recently as well as some orthopnea and PND. He is note sure if he has had any dyspnea on exertion. He states when he feels good, he can walk 1 block without any issues. However, when he feels bad, he can barely walk out to the mailbox. He states he has been feeling bad lately. When asked whether he can't walk far because of shortness of breath or weakness, he was not sure. He denies any lower extremity edema or abdominal distension. He also states he thinks he has been losing weight. He notes some pleuritic chest pain when taking a deep breath on exam but otherwise denies chest pian. He is unaware of his atrial fibrillation and denies palpitations. He states he is not taking Toprol as prescribed because this caused him to feel very weak and dizzy. Patient states he stopped this on his own without discussing with Dr. Rudolpho Sevin. He first tried taking 1/2 the dose and then 1/4 the dose but still had weakness and dizziness with this so he just stopped. He denies any dizziness without the Toprol. No syncope. He cannot tell me whether or not he is actually taking Digoxin at home (first he said no and then he said yes). Otherwise, he states he has been compliant with all his other medication, including Eliquis. No abnormal bleeding on Eliquis. No fevers, body aches, or cough. No abdominal pain or nausea/vomiting.   In  the ED, patient tachycardic, tachypneic, and had a temp of 100.2 EKG showed atrial fibrillation with mild ST depression/biphasic T waves in I, II,V4-V5 (suspect rate related changes). High-sensitivity troponin 25 >> 18 >> 19 >> 15. BNP elevated at 334. Chest x-ray showed mild cardiomegaly with  new pulmonary vascular congestion and diffuse interstitial thickening with small right pleural effusion. Chest CTA showed partially occlusive thrombus within segmental and subsegmental arterial branches within bilateral lungs (no evidence of heart strain) as well as bilateral patchy ground-glass opacity within both lung bases. WBC 9.8, Hgb 11.7, Plts 241. Na 134, K 4.2, Glucose 144, BUN 7, Cr 0.93.   Past Medical History:  Diagnosis Date  . Atrial fibrillation (HCC)   . COPD (chronic obstructive pulmonary disease) (HCC)   . Hypertension   . Stroke [facility])     Past Surgical History:  Procedure Laterality Date  . ABLATION    . INCISION AND DRAINAGE PERIRECTAL ABSCESS N/A 10/20/2012   Procedure: IRRIGATION AND DEBRIDEMENT PERIRECTAL ABSCESS;  Surgeon: Wilmon Arms. Corliss Skains, MD;  Location: [facility] OR;  Service: General;  Laterality: N/A;  Lithotomy     Home Medications:  Prior to Admission medications   Medication Sig Start Date End Date Taking? Authorizing Provider  albuterol (PROVENTIL HFA;VENTOLIN HFA) 108 (90 Base) MCG/ACT inhaler Inhale 2 puffs into the lungs every 4 (four) hours as needed for shortness of breath. 02/07/16 08/26/23 Yes [provider]  albuterol (PROVENTIL) (2.5 MG/3ML) 0.083% nebulizer solution Take 3 mLs (2.5 mg total) by nebulization every 4 (four) hours as needed for wheezing or shortness of breath. 10/07/15  Yes Rolan Bucco, MD  amiodarone (PACERONE) 200 MG tablet Take 200 mg by mouth daily. 01/30/20  Yes [provider]  ASPIRIN LOW DOSE 81 MG EC tablet Take 81 mg by mouth daily. 12/07/19  Yes [provider]  atorvastatin (LIPITOR) 20 MG tablet Take 20 mg by mouth daily. 01/30/20  Yes [provider]  budesonide-formoterol (SYMBICORT) 80-4.5 MCG/ACT inhaler Inhale 2 puffs into the lungs in the morning and at bedtime.   Yes [provider]  digoxin (LANOXIN) 0.125 MG tablet Take 125 mcg by mouth daily. 01/19/20  Yes [provider]  ELIQUIS 5 MG TABS tablet Take 5 mg by mouth 2 (two) times daily. 12/07/19  Yes [provider]  furosemide (LASIX) 20 MG tablet Take 20 mg by mouth daily. 01/11/20  Yes [provider]  metoprolol (TOPROL-XL) 200 MG 24 hr tablet Take 200 mg by mouth daily. 01/19/20  Yes [provider]  spironolactone (ALDACTONE) 25 MG tablet Take 25 mg by mouth 2 (two) times daily. 01/19/20  Yes [provider]    Inpatient Medications: Scheduled Meds: . amiodarone  200 mg Oral Daily  . aspirin EC  81 mg Oral Daily  . atorvastatin  20 mg Oral Daily  . carvedilol  6.25 mg Oral BID WC  . enoxaparin (LOVENOX) injection  120 mg Subcutaneous BID  . furosemide  40 mg Intravenous BID  . mometasone-formoterol  2 puff Inhalation BID  . spironolactone  25 mg Oral Daily   Continuous Infusions: . diltiazem (CARDIZEM) infusion 15 mg/hr (03/08/20 1220)  . lactated ringers 100 mL/hr at 03/08/20 1220   PRN Meds: acetaminophen, albuterol, ondansetron (ZOFRAN) IV  Allergies:    Allergies  Allergen Reactions  . Lisinopril Cough    Social History:   Social History   Socioeconomic History  . Marital status: Single    Spouse name: Not on file  .  Number of children: Not on file  . Years of education: Not on file  . Highest education level: Not on file  Occupational History  . Not on file  Tobacco Use  . Smoking status: Current Some Day Smoker    Packs/day: 0.50  . Smokeless tobacco: Never Used  Substance and Sexual Activity  . Alcohol use: Yes  . Drug use: No    Types: Cocaine  . Sexual activity: Not on file  Other Topics Concern  . Not on file  Social History Narrative  . Not on file   Social Determinants of Health   Financial Resource Strain: Not on file  Food Insecurity: Not on file  Transportation Needs: Not on file  Physical Activity: Not on file  Stress: Not on file  Social Connections: Not on file  Intimate Partner Violence: Not on file     Family History:   Family History  Problem Relation Age of Onset  . Breast cancer Mother   . Heart attack Father 6  . Heart attack Paternal Uncle 72     ROS:  Please see the history of present illness.  Review of Systems  Constitutional: Positive for weight loss. Negative for fever.  HENT: Negative for congestion.   Respiratory: Positive for shortness of breath. Negative for cough.   Cardiovascular: Positive for chest pain (pleuritic chest pain), orthopnea and PND. Negative for palpitations and leg swelling.  Gastrointestinal: Negative for abdominal pain, blood in stool, melena, nausea and vomiting.  Genitourinary: Negative for hematuria.  Neurological: Positive for dizziness. Negative for loss of consciousness.  Endo/Heme/Allergies: Does not bruise/bleed easily.  Psychiatric/Behavioral: Negative for substance abuse.    Physical Exam/Data:   Vitals:   03/08/20 1330 03/08/20 1345 03/08/20 1400 03/08/20 1430  BP: 115/83 124/77 115/82 116/60  Pulse: (!) 109 (!) 101 (!) 113 94  Resp:  (!) 21 18 (!) 28  Temp:      TempSrc:      SpO2: 94% 92% 92% 91%  Weight:        Intake/Output Summary (Last 24 hours) at 03/08/2020 1525 Last data filed at 03/08/2020 1220 Gross per 24 hour  Intake 552.75 ml  Output -  Net 552.75 ml   Last 3 Weights 03/08/2020 10/07/2015 11/16/2014  Weight (lbs) 270 lb 260 lb 260 lb  Weight (kg) 122.471 kg 117.935 kg 117.935 kg     Body mass index is 34.67 kg/m.  General: 60 y.o. morbidly obese African American  male resting comfortably in no acute distress. HEENT: Normocephalic and atraumatic. Sclera clear.  Neck: Supple. No carotid bruits. No JVD. Heart: Mildly tachycardic with irregularly irregular rhythm. Distinct S1 and S2. No murmurs, gallops, or rubs.  Lungs: No increased work of breathing. Mildly decreased breath sounds with mild crackles in bases.  Abdomen: Soft, obese, and non-tender to palpation. Bowel sounds present in all 4 quadrants.   Extremities: Mild lower extremity edema.    Skin: Warm and dry. Neuro: Alert and oriented x3. No focal deficits. Psych: Normal affect. Responds appropriately.  EKG:  The EKG was personally reviewed and demonstrates:  Atrial fibrillation with mild ST depression/biphasic T waves in I, II,V4-V5 (suspect rate related changes).   Telemetry:  Telemetry was personally reviewed and demonstrates:  Atrial fibrillation with rates currently in the 90's to low 100's but initially as high as the 150's.  Relevant CV Studies:  Echocardiogram 12/16/2019 [facility]): Summary: - Reduced systolic function and new echodensity in RA compared with  prior study.  -  There is severe concentric left ventricular hypertrophy.  - There is moderate global hypokinesis of the left ventricle.  - Left ventricular systolic function is moderately reduced.  - LV ejection fraction 40-45%.  - Left ventricular filling pattern is indeterminate. LAP is indeterminate.  - The right ventricular systolic function is mildly reduced.  - The atria are severely dilated  - There is mild aortic regurgitation.  - There is mild mitral regurgitation.  - There is mild to moderate tricuspid regurgitation.  - Round echodensity attatched to superior wall of right atrium, slightly  mobile, and possibly attatched by a small stalk. This could represent  a myxoma, or thrombus as most common etiologies. Recommend further  evaluation with TEE and/or MRI based on TEE findings. [Much less  likely is a prominent Crista Terminalis based on location.]  No obvious pulmonary hypertension.   Laboratory Data:  High Sensitivity Troponin:   Recent Labs  Lab 03/08/20 0116 03/08/20 0249 03/08/20 0506 03/08/20 0858  TROPONINIHS 25* 18* 19* 15     Chemistry Recent Labs  Lab 03/07/20 1642 03/08/20 0506  NA 134* 133*  K 4.2 4.2  CL 98 97*  CO2 25 25  GLUCOSE 144* 146*  BUN 7 7  CREATININE 0.93 0.91  CALCIUM 8.4* 8.4*  GFRNONAA >60 >60   ANIONGAP 11 11    No results for input(s): PROT, ALBUMIN, AST, ALT, ALKPHOS, BILITOT in the last 168 hours. Hematology Recent Labs  Lab 03/07/20 1642 03/08/20 0506  WBC 9.8 9.3  RBC 4.52 4.34  HGB 11.7* 11.2*  HCT 36.8* 34.9*  MCV 81.4 80.4  MCH 25.9* 25.8*  MCHC 31.8 32.1  RDW 18.1* 17.8*  PLT 241 246   BNP Recent Labs  Lab 03/08/20 0116  BNP 334.2*    DDimer No results for input(s): DDIMER in the last 168 hours.   Radiology/Studies:  DG Chest 2 View  Result Date: 03/07/2020 CLINICAL DATA:  Chest pain and shortness of breath. EXAM: CHEST - 2 VIEW COMPARISON:  Chest x-ray dated January 16, 2020. FINDINGS: Unchanged mild cardiomegaly. New pulmonary vascular congestion and diffuse interstitial thickening with small right pleural effusion. Mild right basilar atelectasis. No pneumothorax. No acute osseous abnormality. IMPRESSION: 1. New mild congestive heart failure. Electronically Signed   By: Obie Dredge M.D.   On: 03/07/2020 17:01   CT Angio Chest PE W and/or Wo Contrast  Result Date: 03/08/2020 CLINICAL DATA:  Generalized weakness EXAM: CT ANGIOGRAPHY CHEST WITH CONTRAST TECHNIQUE: Multidetector CT imaging of the chest was performed using the standard protocol during bolus administration of intravenous contrast. Multiplanar CT image reconstructions and MIPs were obtained to evaluate the vascular anatomy. CONTRAST:  OMNIPAQUE IOHEXOL 350 MG/ML SOLN COMPARISON:  None. FINDINGS: Cardiovascular: There is a optimal opacification of the pulmonary arteries. There is partially occlusive thrombus seen in the posterior right lower lobe and right middle lobe segmental and subsegmental arterial branches. There is also partially occlusive thrombus in the left lower lobe subsegmental arterial branches. There is mild cardiomegaly. No pericardial effusion or thickening. No evidence right heart strain. There is normal three-vessel brachiocephalic anatomy without proximal stenosis.  Scattered aortic atherosclerosis is noted. Mediastinum/Nodes: No hilar, mediastinal, or axillary adenopathy. Thyroid gland, trachea, and esophagus demonstrate no significant findings. Lungs/Pleura: A small right pleural effusion is present. There is patchy airspace opacity seen at the right lung base with ground-glass opacities. There is also a small amount of ground-glass opacity seen at the left lung base. Upper Abdomen: No acute abnormalities  present in the visualized portions of the upper abdomen. Musculoskeletal: No chest wall abnormality. No acute or significant osseous findings. Review of the MIP images confirms the above findings. IMPRESSION: Partially occlusive thrombus within segmental and subsegmental arterial branches within the bilateral lungs as described above. No evidence of right ventricular heart strain. Small right pleural effusion Bilateral patchy ground-glass opacity seen within both lung bases right greater than left which could be due to atelectasis, or infectious etiology. Aortic Atherosclerosis (ICD10-I70.0). These results were called by telephone at the time of interpretation on 03/08/2020 at 1:56 am to provider Geoffery Lyons , who verbally acknowledged these results. Electronically Signed   By: Jonna Clark M.D.   On: 03/08/2020 02:00   ECHOCARDIOGRAM COMPLETE  Result Date: 03/08/2020    ECHOCARDIOGRAM REPORT   Patient Name:   KIPTON SKILLEN Date of Exam: 03/08/2020 Medical Rec #:  [id number]       Height:       74.0 in Accession #:    [id number]      Weight:       270.0 lb Date of Birth:  10-02-1960        BSA:          2.469 m Patient Age:    60 years        BP:           113/87 mmHg Patient Gender: M               HR:           111 bpm. Exam Location:  Inpatient Procedure: 2D Echo, Cardiac Doppler and Color Doppler Indications:    Atrial fibrillation  History:        Patient has no prior history of Echocardiogram examinations.                 COPD, Signs/Symptoms:Shortness of Breath;  Risk                 Factors:Hypertension. H/O CVA.  Sonographer:    Ross Ludwig RDCS (AE) Referring Phys: [id number] Claudean Severance CHOTINER IMPRESSIONS  1. Left ventricular ejection fraction, by estimation, is 40 to 45%. The left ventricle has mildly decreased function. The left ventricle demonstrates global hypokinesis. There is severe left ventricular hypertrophy. Left ventricular diastolic function could not be evaluated.  2. Right ventricular systolic function is mildly reduced. The right ventricular size is normal. There is moderately elevated pulmonary artery systolic pressure. The estimated right ventricular systolic pressure is 46.8 mmHg.  3. Left atrial size was severely dilated.  4. Right atrial size was severely dilated.  5. The pericardial effusion is localized near the right atrium.  6. The mitral valve is abnormal. Mild to moderate mitral valve regurgitation. Moderate mitral annular calcification.  7. The aortic valve is tricuspid. Aortic valve regurgitation is trivial. Aortic regurgitation PHT measures 449 msec. Aortic valve mean gradient measures 4.7 mmHg.  8. Aortic dilatation noted. There is mild dilatation of the aortic root, measuring 40 mm.  9. The inferior vena cava is dilated in size with <50% respiratory variability, suggesting right atrial pressure of 15 mmHg. Comparison(s): No prior Echocardiogram. FINDINGS  Left Ventricle: Left ventricular ejection fraction, by estimation, is 40 to 45%. The left ventricle has mildly decreased function. The left ventricle demonstrates global hypokinesis. The left ventricular internal cavity size was normal in size. There is  severe left ventricular hypertrophy. Left ventricular diastolic function could not be evaluated due to atrial fibrillation. Left ventricular  diastolic function could not be evaluated. Right Ventricle: The right ventricular size is normal. No increase in right ventricular wall thickness. Right ventricular systolic function is mildly reduced.  There is moderately elevated pulmonary artery systolic pressure. The tricuspid regurgitant velocity is 2.82 m/s, and with an assumed right atrial pressure of 15 mmHg, the estimated right ventricular systolic pressure is 46.8 mmHg. Left Atrium: Left atrial size was severely dilated. Right Atrium: Right atrial size was severely dilated. Pericardium: Trivial pericardial effusion is present. The pericardial effusion is localized near the right atrium. Mitral Valve: The mitral valve is abnormal. There is mild thickening of the mitral valve leaflet(s). Moderate mitral annular calcification. Mild to moderate mitral valve regurgitation. Tricuspid Valve: The tricuspid valve is not well visualized. Tricuspid valve regurgitation is trivial. Aortic Valve: The aortic valve is tricuspid. Aortic valve regurgitation is trivial. Aortic regurgitation PHT measures 449 msec. Aortic valve mean gradient measures 4.7 mmHg. Aortic valve peak gradient measures 7.5 mmHg. Aortic valve area, by VTI measures  3.10 cm. Pulmonic Valve: The pulmonic valve was grossly normal. Pulmonic valve regurgitation is not visualized. Aorta: Aortic dilatation noted. There is mild dilatation of the aortic root, measuring 40 mm. Venous: The inferior vena cava is dilated in size with less than 50% respiratory variability, suggesting right atrial pressure of 15 mmHg. IAS/Shunts: No atrial level shunt detected by color flow Doppler.  LEFT VENTRICLE PLAX 2D LVIDd:         5.10 cm LVIDs:         4.40 cm LV PW:         1.70 cm LV IVS:        2.00 cm LVOT diam:     2.50 cm      3D Volume EF: LV SV:         68           3D EF:        40 % LV SV Index:   28           LV EDV:       223 ml LVOT Area:     4.91 cm     LV ESV:       135 ml                             LV SV:        89 ml  LV Volumes (MOD) LV vol d, MOD A2C: 171.0 ml LV vol d, MOD A4C: 176.0 ml LV vol s, MOD A2C: 88.9 ml LV vol s, MOD A4C: 104.0 ml LV SV MOD A2C:     82.1 ml LV SV MOD A4C:     176.0 ml LV SV  MOD BP:      75.7 ml RIGHT VENTRICLE            IVC RV Basal diam:  4.60 cm    IVC diam: 2.50 cm RV Mid diam:    3.50 cm RV S prime:     9.03 cm/s TAPSE (M-mode): 1.2 cm LEFT ATRIUM              Index       RIGHT ATRIUM           Index LA diam:        5.30 cm  2.15 cm/m  RA Area:     32.20 cm LA Vol (A2C):   138.0 ml 55.89 ml/m  RA Volume:   123.00 ml 49.82 ml/m LA Vol (A4C):   154.0 ml 62.37 ml/m LA Biplane Vol: 158.0 ml 63.99 ml/m  AORTIC VALVE AV Area (Vmax):    3.48 cm AV Area (Vmean):   3.19 cm AV Area (VTI):     3.10 cm AV Vmax:           137.00 cm/s AV Vmean:          104.367 cm/s AV VTI:            0.219 m AV Peak Grad:      7.5 mmHg AV Mean Grad:      4.7 mmHg LVOT Vmax:         97.07 cm/s LVOT Vmean:        67.867 cm/s LVOT VTI:          0.138 m LVOT/AV VTI ratio: 0.63 AI PHT:            449 msec  AORTA Ao Root diam: 3.50 cm Ao Asc diam:  4.00 cm MR Peak grad:    82.1 mmHg   TRICUSPID VALVE MR Mean grad:    51.0 mmHg   TR Peak grad:   31.8 mmHg MR Vmax:         453.00 cm/s TR Vmax:        282.00 cm/s MR Vmean:        331.0 cm/s MR PISA:         2.26 cm    SHUNTS MR PISA Eff ROA: 19 mm      Systemic VTI:  0.14 m MR PISA Radius:  0.60 cm     Systemic Diam: 2.50 cm Zoila Shutter MD Electronically signed by Zoila Shutter MD Signature Date/Time: 03/08/2020/12:38:34 PM    Final    VAS [place] LOWER EXTREMITY VENOUS (DVT)  Result Date: 03/08/2020  Lower Venous DVT Study Indications: Swelling.  Risk Factors: None identified. Comparison Study: No previous Performing Technologist: Clint Guy RVT  Examination Guidelines: A complete evaluation includes B-mode imaging, spectral Doppler, color Doppler, and power Doppler as needed of all accessible portions of each vessel. Bilateral testing is considered an integral part of a complete examination. Limited examinations for reoccurring indications may be performed as noted. The reflux portion of the exam is performed with the patient in reverse Trendelenburg.   +---------+---------------+---------+-----------+----------+--------------+ RIGHT    CompressibilityPhasicitySpontaneityPropertiesThrombus Aging +---------+---------------+---------+-----------+----------+--------------+ CFV      Full           Yes      Yes                                 +---------+---------------+---------+-----------+----------+--------------+ SFJ      Full                                                        +---------+---------------+---------+-----------+----------+--------------+ FV Prox  Full                                                        +---------+---------------+---------+-----------+----------+--------------+ FV Mid   Full                                                        +---------+---------------+---------+-----------+----------+--------------+  FV DistalFull                                                        +---------+---------------+---------+-----------+----------+--------------+ PFV      Full                                                        +---------+---------------+---------+-----------+----------+--------------+ POP      Full           Yes      Yes                                 +---------+---------------+---------+-----------+----------+--------------+ PTV      Full                                                        +---------+---------------+---------+-----------+----------+--------------+ PERO     Full                                                        +---------+---------------+---------+-----------+----------+--------------+ Soleal   Full                                                        +---------+---------------+---------+-----------+----------+--------------+ Gastroc  Full                                                        +---------+---------------+---------+-----------+----------+--------------+    +---------+---------------+---------+-----------+----------+--------------+ LEFT     CompressibilityPhasicitySpontaneityPropertiesThrombus Aging +---------+---------------+---------+-----------+----------+--------------+ CFV      Full           Yes      Yes                                 +---------+---------------+---------+-----------+----------+--------------+ SFJ      Full                                                        +---------+---------------+---------+-----------+----------+--------------+ FV Prox  Full                                                        +---------+---------------+---------+-----------+----------+--------------+  FV Mid   Full                                                        +---------+---------------+---------+-----------+----------+--------------+ FV DistalFull                                                        +---------+---------------+---------+-----------+----------+--------------+ PFV      Full                                                        +---------+---------------+---------+-----------+----------+--------------+ POP      Full           Yes      Yes                                 +---------+---------------+---------+-----------+----------+--------------+ PTV      Full                                                        +---------+---------------+---------+-----------+----------+--------------+ PERO     Full                                                        +---------+---------------+---------+-----------+----------+--------------+     Summary: BILATERAL: - No evidence of deep vein thrombosis seen in the lower extremities, bilaterally. -No evidence of popliteal cyst, bilaterally.   *See table(s) above for measurements and observations.    Preliminary      Assessment and Plan:   Persistent Atrial Fibrillation with RVR - Patient presented with generalized  weakness/unwellness and found to have acute PE. He was in atrial fibrillation with rates in the 150's on admission. He has known persistent atrial fibrillation and history of atrial flutter s/p ablation in 2018. - Started on IV Cardizem and rates reasonable well controlled in the 90's to low 100's.  - Potassium and Magnesium normal. - Echo showed LVEF of 40-45% with global hypokinesis, severe LVH, and severe biatrial enlargement. RV normal in size with mildly reduced systolic function and moderately elevated PASP of 46.8mg  daily. - Continue IV Cardizem for now while initially being treated for acute PE. Will start Coreg 6.25mg  twice daily (will place hold parameters) and can then hopefully wean off Cardizem. - Continue home Amiodarone 200mg  daily. - Per last office visit note with primary Cardiologist, Digoxin was discontinued. However patient is not sure if he has been taking it or not. Digoxin level elevated which suggest he is taking it at home. Will stop.  - Currently on Lovenox right now for anticoagulation. OK to continue. I suspect patient was  not compliant with Eliquis but hard to know for sure whether or not he failed Eliquis. - May need TEE/DCCV prior to discharge.  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 - BNP elevated at 334.  - Chest x-ray showed mild cardiomegaly with new pulmonary vascular congestion and diffuse interstitial thickening with small right pleural effusion. - Echo as above.  - Will stop PO Lasix and start IV Lasix 40mg  twice daily for now and see how he responds.  - Will start Coreg as above. - Continue Spironolactone 25mg  daily. - May be able to start Losartan tomorrow if BP allows. - Continue to monitor daily weights, strict I/O's, and renal function.  Demand Ischemia - High-sensitivity troponin 25 >> 18 >> 19 >> 15. - EKG showed mild ST depression/T  waves inversion in  I, II,V4-V5 (suspect rate related changes). - Echo as above. - He notes pleuritic chest pain when taking a deep breath on exam but no angina. - Suspect demand ischemia in the setting of acute PE, atrial fibrillation with RVR, and CHF. He did have a Myoview in 2018 that showed no ischemia. No additional ischemic work-up necessary at this time.  Acute PE - Chest CTA showed partially occlusive thrombus within segmental and subsegmental arterial branches within bilateral lungs (no evidence of heart strain)  - Lower extremity dopplers negative for DVT. - Currently on Lovenox for anticoagulation. Difficult to say whether he truly failed Eliquis due to history of non-compliance. - Management per primary team.  Hypertension - BP currently well controlled. - Continue IV Diltiazem for now. - Will start Coreg as above. - Continue Spironolactone.   Hyperlipidemia - Lipid panel this admission: Total Cholesterol 86, Triglycerides 40, HDL 22, LDL 56.  - Continue home statin.   Otherwise, per primary team: - COPD - Anemia     Risk Assessment/Risk Scores:   New York Heart Association (NYHA) Functional Class NYHA Class II-III. Acute PE also contributing to patient current dyspnea.  CHA2DS2-VASc Score = 4  This indicates a 4.8% annual risk of stroke. The patient's score is based upon: CHF History: Yes HTN History: Yes Diabetes History: No Stroke History: Yes Vascular Disease History: No Age Score: 0 Gender Score: 0    For questions or updates, please contact [facility] Please consult [email or web address] for contact info under    Signed, Corrin Parker, PA-C  03/08/2020 3:25 PM   History and all data above reviewed.  Patient examined.  I agree with the findings as above.   The patient live by himself and came in with mostly fatigue.  He has chronic atrial fib and was to have a cardioversion as above.  He also has a mildly reduced EF.  Now found to have a PE.  He  reports that he is compliant with meds but says he did miss Eliquis only while in the ED.  He has increased DOE walking several yards on level ground and perhaps some orthopnea.  He says he has been losing weight and he does not have increased swelling.  No chest pain.  He doesn't really have palpitations and did not think that his atrial fib was acting up.  He has no syncope or presyncope.  He watches salt per his report.   The patient exam reveals QAS:[id number]   ,  Lungs: Bilateral basilar crackles  ,  Abd: Obese, Ext Mild bilateral leg edema  .  All available  labs, radiology testing, previous records reviewed. Agree with documented assessment and plan.   Atrial fib:  On Lovenox.  I would suggest resume DOAC per PE protocol.  I don't think that he has failed Eliquis.  Cardizem for rate control and we will start Coreg.  He says he wont take metoprolol because it makes him feel tired.  I suggest we would probably plan TEE/DCCV before discharge.   Acute on chronic systolic and diastolic HF:  Give IV diuresis.  Will try to start ARB before discharge.      Fayrene FearingJames Saatvik Thielman  4:07 PM  03/08/2020

## 2020-03-08 NOTE — Progress Notes (Signed)
  Echocardiogram 2D Echocardiogram has been performed.  Donald [name] 03/08/2020, 11:44 AM

## 2020-03-08 NOTE — ED Notes (Signed)
Attempt report x1  

## 2020-03-08 NOTE — Progress Notes (Signed)
Bilateral lower extremity venous study completed.      Please see CV Proc for preliminary results.   Reata Petrov, RVT  

## 2020-03-08 NOTE — ED Notes (Signed)
Pt to CT

## 2020-03-08 NOTE — Progress Notes (Signed)
PROGRESS NOTE  [facility] QIW:[id number] DOB: [birth year]   PCP: Renaye Rakers, MD  Patient is from: Home.  Lives alone.  Uses a scooter at baseline.  DOA: 03/07/2020 LOS: 0  Chief complaints: Shortness of breath, palpitation and generalized weakness  Brief Narrative / Interim history: 60 year old male with history of COPD not on oxygen, A. fib on Eliquis, CVA, HTN and debility presenting with shortness of breath, generalized weakness and palpitation and admitted with A. fib with RVR and bilateral PE without evidence of right heart strain.  He was started on Cardizem drip and Lovenox.  Patient reports good compliance with his Eliquis at home.  However, there was concern about his compliance with his anticoagulation partly due to confusion per his cardiologist note on 01/30/2020.  Reportedly has had slowing of his mentation since his CVA in 2018.  Subjective: Seen and examined earlier this morning.  Heart rate improving, in the range of 100-120.  No complaints.  He denies chest pain, dyspnea, leg swelling or pain, new focal neuro symptoms, GI or UTI symptoms.  He reports good compliance with his Eliquis.   Objective: Vitals:   03/08/20 0700 03/08/20 0715 03/08/20 0730 03/08/20 0745  BP: (!) 116/96 (!) 119/91 110/80 104/80  Pulse: (!) 111 (!) 109 60 96  Resp: (!) 25 (!) 29 (!) 27 (!) 26  Temp:      TempSrc:      SpO2: 94% 97% 100% 97%  Weight:        Intake/Output Summary (Last 24 hours) at 03/08/2020 1006 Last data filed at 03/08/2020 0940 Gross per 24 hour  Intake 125 ml  Output --  Net 125 ml   Filed Weights   03/08/20 0349  Weight: 122.5 kg    Examination:  GENERAL: No apparent distress.  Nontoxic. HEENT: MMM.  Vision and hearing grossly intact.  NECK: Supple.  No apparent JVD.  RESP: 96 L on 2 L.  No IWOB.  Fair aeration bilaterally. CVS: Irregular rhythm.  HR ranges from 100-1 120s. Heart sounds normal.  ABD/GI/GU: BS+. Abd soft, NTND.  MSK/EXT:  Moves extremities.  No apparent deformity.  Trace BLE edema.  No calf tenderness. SKIN: no apparent skin lesion or wound NEURO: Awake, alert and oriented appropriately.  No apparent focal neuro deficit. PSYCH: Calm. Normal affect.  Procedures:  None  Microbiology summarized: COVID-19 and influenza  PCR nonreactive.  Assessment & Plan: Persistent A. fib with RVR-likely provoked by PE.  HR improving with Cardizem drip.  He is on high-dose metoprolol XL, digoxin, amiodarone and Eliquis at home.  He was seen by his cardiologist on 01/30/2020.  The plan was to consider cardioversion if he is compliant with his Eliquis.  -Continue Cardizem drip-we will transition to home metoprolol when able -Resume home amiodarone and digoxin -Check digoxin level and TTE -On Lovenox for anticoagulation now.  -Optimize electrolytes. -Consult cardiology  Acute pulmonary embolism: CTA shows partially occlusive thrombus within segmental and subsegmental arterial branches within the bilateral lungs.  No evidence of RV heart strain.  He reports good compliance with his Eliquis although there was concern about noncompliance due to confusion with his medication per his cardiologist note.  As a result, it is difficult to declare him as Eliquis failure. -Continue subcu Loveno for now.  -Check LE ultrasound -Follow TTE.  Elevated BNP-likely due to A. fib.  Does not appear fluid overloaded.  No history of CHF. -Follow TTE  Elevated troponin: Likely demand ischemia. -Follow TTE  Essential hypertension:  Soft blood pressure on Cardizem. -Hold home metoprolol -Resume home digoxin  Chronic COPD: Stable. -Resume home Symbicort  History of embolic CVA: does not appear to have significant residual deficit but possible slow mentation -Continue anticoagulation and statin.  Debility-reportedly uses a scooter at baseline. -PT/OT eval  Body mass index is 34.67 kg/m.         DVT prophylaxis:  On Lovenox for anticoagulation.  Code  Status: Full code Family Communication: Patient and/or RN.  Attempted to call patient's son for update but no answer. Level of care: Telemetry Cardiac Status is: Inpatient  Remains inpatient appropriate because:Hemodynamically unstable, Ongoing diagnostic testing needed not appropriate for outpatient work up, Unsafe d/c plan, IV treatments appropriate due to intensity of illness or inability to take PO and Inpatient level of care appropriate due to severity of illness   Dispo: The patient is from: Home              Anticipated d/c is to: To be determined              Anticipated d/c date is: 2 days              Patient currently is not medically stable to d/c.   Difficult to place patient No       Consultants:  Cardiology   Sch Meds:  Scheduled Meds: . amiodarone  200 mg Oral Daily  . aspirin EC  81 mg Oral Daily  . atorvastatin  20 mg Oral Daily  . digoxin  125 mcg Oral Daily  . enoxaparin (LOVENOX) injection  120 mg Subcutaneous BID  . furosemide  20 mg Oral Daily  . mometasone-formoterol  2 puff Inhalation BID  . spironolactone  25 mg Oral Daily   Continuous Infusions: . diltiazem (CARDIZEM) infusion 15 mg/hr (03/08/20 0941)  . lactated ringers 100 mL/hr at 03/08/20 0459  . magnesium sulfate bolus IVPB     PRN Meds:.acetaminophen, albuterol, ondansetron (ZOFRAN) IV  Antimicrobials: Anti-infectives (From admission, onward)   None       I have personally reviewed the following labs and images: CBC: Recent Labs  Lab 03/07/20 1642 03/08/20 0506  WBC 9.8 9.3  HGB 11.7* 11.2*  HCT 36.8* 34.9*  MCV 81.4 80.4  PLT 241 246   BMP &GFR Recent Labs  Lab 03/07/20 1642 03/08/20 0506 03/08/20 0858  NA 134* 133*  --   K 4.2 4.2  --   CL 98 97*  --   CO2 25 25  --   GLUCOSE 144* 146*  --   BUN 7 7  --   CREATININE 0.93 0.91  --   CALCIUM 8.4* 8.4*  --   MG  --   --  1.8   CrCl cannot be calculated (Unknown ideal weight.). Liver & Pancreas: No results  for input(s): AST, ALT, ALKPHOS, BILITOT, PROT, ALBUMIN in the last 168 hours. No results for input(s): LIPASE, AMYLASE in the last 168 hours. No results for input(s): AMMONIA in the last 168 hours. Diabetic: No results for input(s): HGBA1C in the last 72 hours. No results for input(s): GLUCAP in the last 168 hours. Cardiac Enzymes: No results for input(s): CKTOTAL, CKMB, CKMBINDEX, TROPONINI in the last 168 hours. No results for input(s): PROBNP in the last 8760 hours. Coagulation Profile: No results for input(s): INR, PROTIME in the last 168 hours. Thyroid Function Tests: No results for input(s): TSH, T4TOTAL, FREET4, T3FREE, THYROIDAB in the last 72 hours. Lipid Profile: Recent Labs  03/08/20 0506  CHOL 86  HDL 22*  LDLCALC 56  TRIG 40  CHOLHDL 3.9   Anemia Panel: No results for input(s): VITAMINB12, FOLATE, FERRITIN, TIBC, IRON, RETICCTPCT in the last 72 hours. Urine analysis:    Component Value Date/Time   COLORURINE AMBER (A) 03/08/2020 0106   APPEARANCEUR CLEAR 03/08/2020 0106   LABSPEC 1.019 03/08/2020 0106   PHURINE 6.0 03/08/2020 0106   GLUCOSEU NEGATIVE 03/08/2020 0106   HGBUR NEGATIVE 03/08/2020 0106   BILIRUBINUR SMALL (A) 03/08/2020 0106   KETONESUR NEGATIVE 03/08/2020 0106   PROTEINUR 30 (A) 03/08/2020 0106   UROBILINOGEN 1.0 10/20/2012 1340   NITRITE NEGATIVE 03/08/2020 0106   LEUKOCYTESUR NEGATIVE 03/08/2020 0106   Sepsis Labs: Invalid input(s): PROCALCITONIN, LACTICIDVEN  Microbiology: Recent Results (from the past 240 hour(s))  Resp Panel by RT-PCR (Flu A&B, Covid) Nasopharyngeal Swab     Status: None   Collection Time: 03/08/20  1:14 AM   Specimen: Nasopharyngeal Swab; Nasopharyngeal(NP) swabs in vial transport medium  Result Value Ref Range Status   SARS Coronavirus 2 by RT PCR NEGATIVE NEGATIVE Final    Comment: (NOTE) SARS-CoV-2 target nucleic acids are NOT DETECTED.  The SARS-CoV-2 RNA is generally detectable in upper  respiratory specimens during the acute phase of infection. The lowest concentration of SARS-CoV-2 viral copies this assay can detect is 138 copies/mL. A negative result does not preclude SARS-Cov-2 infection and should not be used as the sole basis for treatment or other patient management decisions. A negative result may occur with  improper specimen collection/handling, submission of specimen other than nasopharyngeal swab, presence of viral mutation(s) within the areas targeted by this assay, and inadequate number of viral copies(<138 copies/mL). A negative result must be combined with clinical observations, patient history, and epidemiological information. The expected result is Negative.  Fact Sheet for Patients:  BloggerCourse.com  Fact Sheet for Healthcare Providers:  SeriousBroker.it  This test is no t yet approved or cleared by the [place] FDA and  has been authorized for detection and/or diagnosis of SARS-CoV-2 by FDA under an Emergency Use Authorization (EUA). This EUA will remain  in effect (meaning this test can be used) for the duration of the COVID-19 declaration under Section 564(b)(1) of the Act, 21 U.S.C.section 360bbb-3(b)(1), unless the authorization is terminated  or revoked sooner.       Influenza A by PCR NEGATIVE NEGATIVE Final   Influenza B by PCR NEGATIVE NEGATIVE Final    Comment: (NOTE) The Xpert Xpress SARS-CoV-2/FLU/RSV plus assay is intended as an aid in the diagnosis of influenza from Nasopharyngeal swab specimens and should not be used as a sole basis for treatment. Nasal washings and aspirates are unacceptable for Xpert Xpress SARS-CoV-2/FLU/RSV testing.  Fact Sheet for Patients: BloggerCourse.com  Fact Sheet for Healthcare Providers: SeriousBroker.it  This test is not yet approved or cleared by the [place] FDA and has been  authorized for detection and/or diagnosis of SARS-CoV-2 by FDA under an Emergency Use Authorization (EUA). This EUA will remain in effect (meaning this test can be used) for the duration of the COVID-19 declaration under Section 564(b)(1) of the Act, 21 U.S.C. section 360bbb-3(b)(1), unless the authorization is terminated or revoked.  Performed at [facility], [address] [place], [place] [address]     Radiology Studies: DG Chest 2 View  Result Date: 03/07/2020 CLINICAL DATA:  Chest pain and shortness of breath. EXAM: CHEST - 2 VIEW COMPARISON:  Chest x-ray dated January 16, 2020. FINDINGS: Unchanged  mild cardiomegaly. New pulmonary vascular congestion and diffuse interstitial thickening with small right pleural effusion. Mild right basilar atelectasis. No pneumothorax. No acute osseous abnormality. IMPRESSION: 1. New mild congestive heart failure. Electronically Signed   By: Obie Dredge M.D.   On: 03/07/2020 17:01   CT Angio Chest PE W and/or Wo Contrast  Result Date: 03/08/2020 CLINICAL DATA:  Generalized weakness EXAM: CT ANGIOGRAPHY CHEST WITH CONTRAST TECHNIQUE: Multidetector CT imaging of the chest was performed using the standard protocol during bolus administration of intravenous contrast. Multiplanar CT image reconstructions and MIPs were obtained to evaluate the vascular anatomy. CONTRAST:  OMNIPAQUE IOHEXOL 350 MG/ML SOLN COMPARISON:  None. FINDINGS: Cardiovascular: There is a optimal opacification of the pulmonary arteries. There is partially occlusive thrombus seen in the posterior right lower lobe and right middle lobe segmental and subsegmental arterial branches. There is also partially occlusive thrombus in the left lower lobe subsegmental arterial branches. There is mild cardiomegaly. No pericardial effusion or thickening. No evidence right heart strain. There is normal three-vessel brachiocephalic anatomy without proximal stenosis. Scattered aortic  atherosclerosis is noted. Mediastinum/Nodes: No hilar, mediastinal, or axillary adenopathy. Thyroid gland, trachea, and esophagus demonstrate no significant findings. Lungs/Pleura: A small right pleural effusion is present. There is patchy airspace opacity seen at the right lung base with ground-glass opacities. There is also a small amount of ground-glass opacity seen at the left lung base. Upper Abdomen: No acute abnormalities present in the visualized portions of the upper abdomen. Musculoskeletal: No chest wall abnormality. No acute or significant osseous findings. Review of the MIP images confirms the above findings. IMPRESSION: Partially occlusive thrombus within segmental and subsegmental arterial branches within the bilateral lungs as described above. No evidence of right ventricular heart strain. Small right pleural effusion Bilateral patchy ground-glass opacity seen within both lung bases right greater than left which could be due to atelectasis, or infectious etiology. Aortic Atherosclerosis (ICD10-I70.0). These results were called by telephone at the time of interpretation on 03/08/2020 at 1:56 am to provider Geoffery Lyons , who verbally acknowledged these results. Electronically Signed   By: Jonna Clark M.D.   On: 03/08/2020 02:00    No charge service.  Admission after midnight.   Kennedee Kitzmiller T. Errika Narvaiz [facility]  If 7PM-7AM, please contact night-coverage [email or web address] 03/08/2020, 10:06 AM

## 2020-03-08 NOTE — ED Notes (Signed)
Lunch Tray Ordered @ 1030. 

## 2020-03-09 DIAGNOSIS — I4891 Unspecified atrial fibrillation: Secondary | ICD-10-CM | POA: Diagnosis not present

## 2020-03-09 LAB — CBC
HCT: 32.4 % — ABNORMAL LOW (ref 39.0–52.0)
Hemoglobin: 10.4 g/dL — ABNORMAL LOW (ref 13.0–17.0)
MCH: 25.3 pg — ABNORMAL LOW (ref 26.0–34.0)
MCHC: 32.1 g/dL (ref 30.0–36.0)
MCV: 78.8 fL — ABNORMAL LOW (ref 80.0–100.0)
Platelets: 238 10*3/uL (ref 150–400)
RBC: 4.11 MIL/uL — ABNORMAL LOW (ref 4.22–5.81)
RDW: 17.6 % — ABNORMAL HIGH (ref 11.5–15.5)
WBC: 8.5 10*3/uL (ref 4.0–10.5)
nRBC: 0 % (ref 0.0–0.2)

## 2020-03-09 LAB — RENAL FUNCTION PANEL
Albumin: 2.5 g/dL — ABNORMAL LOW (ref 3.5–5.0)
Anion gap: 11 (ref 5–15)
BUN: 10 mg/dL (ref 6–20)
CO2: 24 mmol/L (ref 22–32)
Calcium: 8.3 mg/dL — ABNORMAL LOW (ref 8.9–10.3)
Chloride: 97 mmol/L — ABNORMAL LOW (ref 98–111)
Creatinine, Ser: 0.98 mg/dL (ref 0.61–1.24)
GFR, Estimated: >60 mL/min (ref >60)
Glucose, Bld: 188 mg/dL — ABNORMAL HIGH (ref 70–99)
Phosphorus: 3.9 mg/dL (ref 2.5–4.6)
Potassium: 4.2 mmol/L (ref 3.5–5.1)
Sodium: 132 mmol/L — ABNORMAL LOW (ref 135–145)

## 2020-03-09 LAB — MAGNESIUM: Magnesium: 1.9 mg/dL (ref 1.7–2.4)

## 2020-03-09 MED ORDER — CARVEDILOL 12.5 MG PO TABS
12.5000 mg | ORAL_TABLET | Freq: Two times a day (BID) | ORAL | Status: DC
  Administered 2020-03-09 – 2020-03-12 (×7): 12.5 mg via ORAL
  Filled 2020-03-09 (×7): qty 1

## 2020-03-09 NOTE — Progress Notes (Signed)
Progress Note  Patient Name: Donald [name] Date of Encounter: 03/09/2020  Primary Cardiologist:   No primary care provider on file.   Subjective   Very pleasant.  No pain.  Breathing is better by his report.    Inpatient Medications    Scheduled Meds: . amiodarone  200 mg Oral Daily  . aspirin EC  81 mg Oral Daily  . atorvastatin  20 mg Oral Daily  . carvedilol  6.25 mg Oral BID WC  . Chlorhexidine Gluconate Cloth  6 each Topical Q0600  . enoxaparin (LOVENOX) injection  120 mg Subcutaneous BID  . furosemide  40 mg Intravenous BID  . mometasone-formoterol  2 puff Inhalation BID  . mupirocin ointment  1 application Nasal BID  . spironolactone  25 mg Oral Daily   Continuous Infusions: . diltiazem (CARDIZEM) infusion 10 mg/hr (03/09/20 0344)  . lactated ringers 100 mL/hr at 03/08/20 1220   PRN Meds: acetaminophen, albuterol, melatonin, ondansetron (ZOFRAN) IV   Vital Signs    Vitals:   03/09/20 0300 03/09/20 0731 03/09/20 0732 03/09/20 0755  BP: 110/85 110/73 110/73   Pulse: 96 92    Resp: 20 20 20    Temp: 97.6 F (36.4 C) 97.8 F (36.6 C) 97.8 F (36.6 C)   TempSrc: Oral Oral Oral   SpO2: 94% 95%  97%  Weight:        Intake/Output Summary (Last 24 hours) at 03/09/2020 1025 Last data filed at 03/09/2020 0800 Gross per 24 hour  Intake 2521.5 ml  Output 1050 ml  Net 1471.5 ml   Filed Weights   03/08/20 0349  Weight: 122.5 kg    Telemetry    Atrial fib with increased reasonable rate control - Personally Reviewed  ECG    NA - Personally Reviewed  Physical Exam   GEN: No acute distress.   Neck: No  JVD Cardiac: Irregular RR, no murmurs, rubs, or gallops.  Respiratory: Clear  to auscultation bilaterally. GI: Soft, nontender, non-distended  MS:    Mild diffuse edema; No deformity. Neuro:  Nonfocal  Psych: Normal affect   Labs    Chemistry Recent Labs  Lab 03/07/20 1642 03/08/20 0506 03/09/20 0152  NA 134* 133* 132*  K 4.2 4.2 4.2  CL  98 97* 97*  CO2 25 25 24   GLUCOSE 144* 146* 188*  BUN 7 7 10   CREATININE 0.93 0.91 0.98  CALCIUM 8.4* 8.4* 8.3*  ALBUMIN  --   --  2.5*  GFRNONAA >60 >60 >60  ANIONGAP 11 11 11      Hematology Recent Labs  Lab 03/07/20 1642 03/08/20 0506 03/09/20 0152  WBC 9.8 9.3 8.5  RBC 4.52 4.34 4.11*  HGB 11.7* 11.2* 10.4*  HCT 36.8* 34.9* 32.4*  MCV 81.4 80.4 78.8*  MCH 25.9* 25.8* 25.3*  MCHC 31.8 32.1 32.1  RDW 18.1* 17.8* 17.6*  PLT 241 246 238    Cardiac EnzymesNo results for input(s): TROPONINI in the last 168 hours. No results for input(s): TROPIPOC in the last 168 hours.   BNP Recent Labs  Lab 03/08/20 0116  BNP 334.2*     DDimer No results for input(s): DDIMER in the last 168 hours.   Radiology    DG Chest 2 View  Result Date: 03/07/2020 CLINICAL DATA:  Chest pain and shortness of breath. EXAM: CHEST - 2 VIEW COMPARISON:  Chest x-ray dated January 16, 2020. FINDINGS: Unchanged mild cardiomegaly. New pulmonary vascular congestion and diffuse interstitial thickening with small right pleural effusion.  Mild right basilar atelectasis. No pneumothorax. No acute osseous abnormality. IMPRESSION: 1. New mild congestive heart failure. Electronically Signed   By: Obie Dredge M.D.   On: 03/07/2020 17:01   CT Angio Chest PE W and/or Wo Contrast  Result Date: 03/08/2020 CLINICAL DATA:  Generalized weakness EXAM: CT ANGIOGRAPHY CHEST WITH CONTRAST TECHNIQUE: Multidetector CT imaging of the chest was performed using the standard protocol during bolus administration of intravenous contrast. Multiplanar CT image reconstructions and MIPs were obtained to evaluate the vascular anatomy. CONTRAST:  OMNIPAQUE IOHEXOL 350 MG/ML SOLN COMPARISON:  None. FINDINGS: Cardiovascular: There is a optimal opacification of the pulmonary arteries. There is partially occlusive thrombus seen in the posterior right lower lobe and right middle lobe segmental and subsegmental arterial branches. There is  also partially occlusive thrombus in the left lower lobe subsegmental arterial branches. There is mild cardiomegaly. No pericardial effusion or thickening. No evidence right heart strain. There is normal three-vessel brachiocephalic anatomy without proximal stenosis. Scattered aortic atherosclerosis is noted. Mediastinum/Nodes: No hilar, mediastinal, or axillary adenopathy. Thyroid gland, trachea, and esophagus demonstrate no significant findings. Lungs/Pleura: A small right pleural effusion is present. There is patchy airspace opacity seen at the right lung base with ground-glass opacities. There is also a small amount of ground-glass opacity seen at the left lung base. Upper Abdomen: No acute abnormalities present in the visualized portions of the upper abdomen. Musculoskeletal: No chest wall abnormality. No acute or significant osseous findings. Review of the MIP images confirms the above findings. IMPRESSION: Partially occlusive thrombus within segmental and subsegmental arterial branches within the bilateral lungs as described above. No evidence of right ventricular heart strain. Small right pleural effusion Bilateral patchy ground-glass opacity seen within both lung bases right greater than left which could be due to atelectasis, or infectious etiology. Aortic Atherosclerosis (ICD10-I70.0). These results were called by telephone at the time of interpretation on 03/08/2020 at 1:56 am to provider Geoffery Lyons , who verbally acknowledged these results. Electronically Signed   By: Jonna Clark M.D.   On: 03/08/2020 02:00   ECHOCARDIOGRAM COMPLETE  Result Date: 03/08/2020    ECHOCARDIOGRAM REPORT   Patient Name:   Donald [name] Date of Exam: 03/08/2020 Medical Rec #:  [id number]       Height:       74.0 in Accession #:    [id number]      Weight:       270.0 lb Date of Birth:  08/31/1960        BSA:          2.469 m Patient Age:    60 years        BP:           113/87 mmHg Patient Gender: M               HR:            111 bpm. Exam Location:  Inpatient Procedure: 2D Echo, Cardiac Doppler and Color Doppler Indications:    Atrial fibrillation  History:        Patient has no prior history of Echocardiogram examinations.                 COPD, Signs/Symptoms:Shortness of Breath; Risk                 Factors:Hypertension. H/O CVA.  Sonographer:    Ross Ludwig RDCS (AE) Referring Phys: [id number] Claudean Severance CHOTINER IMPRESSIONS  1. Left ventricular ejection fraction, by  estimation, is 40 to 45%. The left ventricle has mildly decreased function. The left ventricle demonstrates global hypokinesis. There is severe left ventricular hypertrophy. Left ventricular diastolic function could not be evaluated.  2. Right ventricular systolic function is mildly reduced. The right ventricular size is normal. There is moderately elevated pulmonary artery systolic pressure. The estimated right ventricular systolic pressure is 46.8 mmHg.  3. Left atrial size was severely dilated.  4. Right atrial size was severely dilated.  5. The pericardial effusion is localized near the right atrium.  6. The mitral valve is abnormal. Mild to moderate mitral valve regurgitation. Moderate mitral annular calcification.  7. The aortic valve is tricuspid. Aortic valve regurgitation is trivial. Aortic regurgitation PHT measures 449 msec. Aortic valve mean gradient measures 4.7 mmHg.  8. Aortic dilatation noted. There is mild dilatation of the aortic root, measuring 40 mm.  9. The inferior vena cava is dilated in size with <50% respiratory variability, suggesting right atrial pressure of 15 mmHg. Comparison(s): No prior Echocardiogram. FINDINGS  Left Ventricle: Left ventricular ejection fraction, by estimation, is 40 to 45%. The left ventricle has mildly decreased function. The left ventricle demonstrates global hypokinesis. The left ventricular internal cavity size was normal in size. There is  severe left ventricular hypertrophy. Left ventricular diastolic function could  not be evaluated due to atrial fibrillation. Left ventricular diastolic function could not be evaluated. Right Ventricle: The right ventricular size is normal. No increase in right ventricular wall thickness. Right ventricular systolic function is mildly reduced. There is moderately elevated pulmonary artery systolic pressure. The tricuspid regurgitant velocity is 2.82 m/s, and with an assumed right atrial pressure of 15 mmHg, the estimated right ventricular systolic pressure is 46.8 mmHg. Left Atrium: Left atrial size was severely dilated. Right Atrium: Right atrial size was severely dilated. Pericardium: Trivial pericardial effusion is present. The pericardial effusion is localized near the right atrium. Mitral Valve: The mitral valve is abnormal. There is mild thickening of the mitral valve leaflet(s). Moderate mitral annular calcification. Mild to moderate mitral valve regurgitation. Tricuspid Valve: The tricuspid valve is not well visualized. Tricuspid valve regurgitation is trivial. Aortic Valve: The aortic valve is tricuspid. Aortic valve regurgitation is trivial. Aortic regurgitation PHT measures 449 msec. Aortic valve mean gradient measures 4.7 mmHg. Aortic valve peak gradient measures 7.5 mmHg. Aortic valve area, by VTI measures  3.10 cm. Pulmonic Valve: The pulmonic valve was grossly normal. Pulmonic valve regurgitation is not visualized. Aorta: Aortic dilatation noted. There is mild dilatation of the aortic root, measuring 40 mm. Venous: The inferior vena cava is dilated in size with less than 50% respiratory variability, suggesting right atrial pressure of 15 mmHg. IAS/Shunts: No atrial level shunt detected by color flow Doppler.  LEFT VENTRICLE PLAX 2D LVIDd:         5.10 cm LVIDs:         4.40 cm LV PW:         1.70 cm LV IVS:        2.00 cm LVOT diam:     2.50 cm      3D Volume EF: LV SV:         68           3D EF:        40 % LV SV Index:   28           LV EDV:       223 ml LVOT Area:     4.91 cm  LV ESV:       135 ml                             LV SV:        89 ml  LV Volumes (MOD) LV vol d, MOD A2C: 171.0 ml LV vol d, MOD A4C: 176.0 ml LV vol s, MOD A2C: 88.9 ml LV vol s, MOD A4C: 104.0 ml LV SV MOD A2C:     82.1 ml LV SV MOD A4C:     176.0 ml LV SV MOD BP:      75.7 ml RIGHT VENTRICLE            IVC RV Basal diam:  4.60 cm    IVC diam: 2.50 cm RV Mid diam:    3.50 cm RV S prime:     9.03 cm/s TAPSE (M-mode): 1.2 cm LEFT ATRIUM              Index       RIGHT ATRIUM           Index LA diam:        5.30 cm  2.15 cm/m  RA Area:     32.20 cm LA Vol (A2C):   138.0 ml 55.89 ml/m RA Volume:   123.00 ml 49.82 ml/m LA Vol (A4C):   154.0 ml 62.37 ml/m LA Biplane Vol: 158.0 ml 63.99 ml/m  AORTIC VALVE AV Area (Vmax):    3.48 cm AV Area (Vmean):   3.19 cm AV Area (VTI):     3.10 cm AV Vmax:           137.00 cm/s AV Vmean:          104.367 cm/s AV VTI:            0.219 m AV Peak Grad:      7.5 mmHg AV Mean Grad:      4.7 mmHg LVOT Vmax:         97.07 cm/s LVOT Vmean:        67.867 cm/s LVOT VTI:          0.138 m LVOT/AV VTI ratio: 0.63 AI PHT:            449 msec  AORTA Ao Root diam: 3.50 cm Ao Asc diam:  4.00 cm MR Peak grad:    82.1 mmHg   TRICUSPID VALVE MR Mean grad:    51.0 mmHg   TR Peak grad:   31.8 mmHg MR Vmax:         453.00 cm/s TR Vmax:        282.00 cm/s MR Vmean:        331.0 cm/s MR PISA:         2.26 cm    SHUNTS MR PISA Eff ROA: 19 mm      Systemic VTI:  0.14 m MR PISA Radius:  0.60 cm     Systemic Diam: 2.50 cm Zoila Shutter MD Electronically signed by Zoila Shutter MD Signature Date/Time: 03/08/2020/12:38:34 PM    Final    VAS [place] LOWER EXTREMITY VENOUS (DVT)  Result Date: 03/08/2020  Lower Venous DVT Study Indications: Swelling.  Risk Factors: None identified. Comparison Study: No previous Performing Technologist: Clint Guy RVT  Examination Guidelines: A complete evaluation includes B-mode imaging, spectral Doppler, color Doppler, and power Doppler as needed of all accessible  portions of each vessel. Bilateral testing is considered an integral  part of a complete examination. Limited examinations for reoccurring indications may be performed as noted. The reflux portion of the exam is performed with the patient in reverse Trendelenburg.  +---------+---------------+---------+-----------+----------+--------------+ RIGHT    CompressibilityPhasicitySpontaneityPropertiesThrombus Aging +---------+---------------+---------+-----------+----------+--------------+ CFV      Full           Yes      Yes                                 +---------+---------------+---------+-----------+----------+--------------+ SFJ      Full                                                        +---------+---------------+---------+-----------+----------+--------------+ FV Prox  Full                                                        +---------+---------------+---------+-----------+----------+--------------+ FV Mid   Full                                                        +---------+---------------+---------+-----------+----------+--------------+ FV DistalFull                                                        +---------+---------------+---------+-----------+----------+--------------+ PFV      Full                                                        +---------+---------------+---------+-----------+----------+--------------+ POP      Full           Yes      Yes                                 +---------+---------------+---------+-----------+----------+--------------+ PTV      Full                                                        +---------+---------------+---------+-----------+----------+--------------+ PERO     Full                                                        +---------+---------------+---------+-----------+----------+--------------+ Soleal   Full                                                         +---------+---------------+---------+-----------+----------+--------------+  Gastroc  Full                                                        +---------+---------------+---------+-----------+----------+--------------+   +---------+---------------+---------+-----------+----------+--------------+ LEFT     CompressibilityPhasicitySpontaneityPropertiesThrombus Aging +---------+---------------+---------+-----------+----------+--------------+ CFV      Full           Yes      Yes                                 +---------+---------------+---------+-----------+----------+--------------+ SFJ      Full                                                        +---------+---------------+---------+-----------+----------+--------------+ FV Prox  Full                                                        +---------+---------------+---------+-----------+----------+--------------+ FV Mid   Full                                                        +---------+---------------+---------+-----------+----------+--------------+ FV DistalFull                                                        +---------+---------------+---------+-----------+----------+--------------+ PFV      Full                                                        +---------+---------------+---------+-----------+----------+--------------+ POP      Full           Yes      Yes                                 +---------+---------------+---------+-----------+----------+--------------+ PTV      Full                                                        +---------+---------------+---------+-----------+----------+--------------+ PERO     Full                                                        +---------+---------------+---------+-----------+----------+--------------+  Summary: BILATERAL: - No evidence of deep vein thrombosis seen in the lower extremities, bilaterally. -No evidence of  popliteal cyst, bilaterally.   *See table(s) above for measurements and observations.    Preliminary     Cardiac Studies   ECHO  1. Left ventricular ejection fraction, by estimation, is 40 to 45%. The  left ventricle has mildly decreased function. The left ventricle  demonstrates global hypokinesis. There is severe left ventricular  hypertrophy. Left ventricular diastolic function  could not be evaluated.  2. Right ventricular systolic function is mildly reduced. The right  ventricular size is normal. There is moderately elevated pulmonary artery  systolic pressure. The estimated right ventricular systolic pressure is  46.8 mmHg.  3. Left atrial size was severely dilated.  4. Right atrial size was severely dilated.  5. The pericardial effusion is localized near the right atrium.  6. The mitral valve is abnormal. Mild to moderate mitral valve  regurgitation. Moderate mitral annular calcification.  7. The aortic valve is tricuspid. Aortic valve regurgitation is trivial.  Aortic regurgitation PHT measures 449 msec. Aortic valve mean gradient  measures 4.7 mmHg.  8. Aortic dilatation noted. There is mild dilatation of the aortic root,  measuring 40 mm.  9. The inferior vena cava is dilated in size with <50% respiratory  variability, suggesting right atrial pressure of 15 mmHg.   Patient Profile     60 y.o. male with a history of chronic systolic CHF with EF of 40-45%, persistent atrial fibrillation/paroxysmal atrial flutter s/p flutter ablation on Eliquis 5mg  twice daily, CVA in 2018 when non-compliant with anticoagulation, COPD, obstructive sleep apnea not on CPAP, hypertension, hyperlipidemia, morbid obesity, medication non-compliance, and former tobacco abuse (quit in 2019 or 2020) who is being seen for the evaluation of atrial fibrillation at the request of Dr. Alanda Slim.   Assessment & Plan    Atrial fib with RVR:     I will stop the IV Cardizem and increase his Coreg.    Continue amio.   I think that he will need TEE/DCCV before discharge.   Acute on chronic combined HF:     Urine output in not accurate so unable to assess intake/output.  Creat is stable.  Na slightly low.  Continue IV diuresis.    PE:  Acute this admission.  Resume Eliquis per the primary team.    HTN:  BP is soft.   Change in therapy as above.   For questions or updates, please contact [facility] Please consult [email or web address] for contact info under Cardiology/STEMI.   Signed, Rollene Rotunda, MD  03/09/2020, 10:25 AM

## 2020-03-09 NOTE — Progress Notes (Signed)
PROGRESS NOTE    Donald [name]  MLJ:[id number]  DOB: [birth year]  DOA: 03/07/2020 PCP: Renaye Rakers, MD Outpatient Specialists:   Hospital course:  60 year old male with COPD, A. fib on Eliquis, CVA and HTN was admitted on 03/07/20 with bilateral PE and consequent RVR of his atrial fibrillation.  He was treated with Cardizem drip and full dose Lovenox.   Subjective:  Patient was sitting up in bed on CPAP in no acute distress.  He notes that he puts it on every time he takes a nap but otherwise is feeling fine.  Thinks his breathing is okay even off the CPAP   Objective: Vitals:   03/09/20 0731 03/09/20 0732 03/09/20 0755 03/09/20 1130  BP: 110/73 110/73  98/74  Pulse: 92   81  Resp: 20 20  20   Temp: 97.8 F (36.6 C) 97.8 F (36.6 C)  (!) 97.3 F (36.3 C)  TempSrc: Oral Oral  Oral  SpO2: 95%  97%   Weight:        Intake/Output Summary (Last 24 hours) at 03/09/2020 1508 Last data filed at 03/09/2020 0800 Gross per 24 hour  Intake 2093.75 ml  Output 1050 ml  Net 1043.75 ml   Filed Weights   03/08/20 0349  Weight: 122.5 kg     Exam:  General: Obese gentleman with large chest wall sitting up in bed with CPAP in place speaking in full sentences in no respiratory distress.  No tachypnea. Eyes: sclera anicteric, conjuctiva mild injection bilaterally CVS: S1-S2, regular  Respiratory: Reasonable air entry bilaterally without any wheezes.   GI: NABS, soft, NT  LE: No edema.  Neuro: grossly nonfocal.  Psych: mood and affect appropriate to situation.   Assessment & Plan:   60 year old man with COPD and atrial fibrillation was admitted with RVR likely provoked by bilateral PEs.  Patient had likely been noncompliant with his anticoagulation.  VTE/PE Continue full dose Lovenox CTA shows partially occlusive thrombi within segmental and slight segmental bilateral lungs. No RV strain. Can transition back to Eliquis upon discharge  Atrial fibrillation with  RVR Patient is presently rate controlled and off diltiazem drip Patient placed on carvedilol per cardiology Continue amiodarone  Hyperglycemia Random glucose as high as 188, hemoglobin We will check hemoglobin A1c  Anemia Minimal decrease in H&H Repeat CBC in the morning  Elevated troponin To be secondary to demand ischemia  HTN Blood pressure is under reasonable control on carvedilol and spironolactone  OSA CPAP as needed  COPD No evidence for acute flare Continue Symbicort  History of embolic CVA Presently anticoagulated on full dose Lovenox Request to be restarted upon discharge   DVT prophylaxis: Full dose Eliquis Code Status: Full Family Communication: None today Disposition Plan:   Patient is from: Home  Anticipated Discharge Location: Home  Barriers to Discharge: Just got transitioned off of diltiazem drip today  Is patient medically stable for Discharge: Not yet   Consultants:  Cardiology  Procedures:  Echocardiogram  Antimicrobials:  None   Data Reviewed:  Basic Metabolic Panel: Recent Labs  Lab 03/07/20 1642 03/08/20 0506 03/08/20 0858 03/09/20 0152  NA 134* 133*  --  132*  K 4.2 4.2  --  4.2  CL 98 97*  --  97*  CO2 25 25  --  24  GLUCOSE 144* 146*  --  188*  BUN 7 7  --  10  CREATININE 0.93 0.91  --  0.98  CALCIUM 8.4* 8.4*  --  8.3*  MG  --   --  1.8 1.9  PHOS  --   --   --  3.9   Liver Function Tests: Recent Labs  Lab 03/09/20 0152  ALBUMIN 2.5*   No results for input(s): LIPASE, AMYLASE in the last 168 hours. No results for input(s): AMMONIA in the last 168 hours. CBC: Recent Labs  Lab 03/07/20 1642 03/08/20 0506 03/09/20 0152  WBC 9.8 9.3 8.5  HGB 11.7* 11.2* 10.4*  HCT 36.8* 34.9* 32.4*  MCV 81.4 80.4 78.8*  PLT 241 246 238   Cardiac Enzymes: No results for input(s): CKTOTAL, CKMB, CKMBINDEX, TROPONINI in the last 168 hours. BNP (last 3 results) No results for input(s): PROBNP in the last 8760  hours. CBG: No results for input(s): GLUCAP in the last 168 hours.  Recent Results (from the past 240 hour(s))  Resp Panel by RT-PCR (Flu A&B, Covid) Nasopharyngeal Swab     Status: None   Collection Time: 03/08/20  1:14 AM   Specimen: Nasopharyngeal Swab; Nasopharyngeal(NP) swabs in vial transport medium  Result Value Ref Range Status   SARS Coronavirus 2 by RT PCR NEGATIVE NEGATIVE Final    Comment: (NOTE) SARS-CoV-2 target nucleic acids are NOT DETECTED.  The SARS-CoV-2 RNA is generally detectable in upper respiratory specimens during the acute phase of infection. The lowest concentration of SARS-CoV-2 viral copies this assay can detect is 138 copies/mL. A negative result does not preclude SARS-Cov-2 infection and should not be used as the sole basis for treatment or other patient management decisions. A negative result may occur with  improper specimen collection/handling, submission of specimen other than nasopharyngeal swab, presence of viral mutation(s) within the areas targeted by this assay, and inadequate number of viral copies(<138 copies/mL). A negative result must be combined with clinical observations, patient history, and epidemiological information. The expected result is Negative.  Fact Sheet for Patients:  BloggerCourse.com  Fact Sheet for Healthcare Providers:  SeriousBroker.it  This test is no t yet approved or cleared by the [place] FDA and  has been authorized for detection and/or diagnosis of SARS-CoV-2 by FDA under an Emergency Use Authorization (EUA). This EUA will remain  in effect (meaning this test can be used) for the duration of the COVID-19 declaration under Section 564(b)(1) of the Act, 21 U.S.C.section 360bbb-3(b)(1), unless the authorization is terminated  or revoked sooner.       Influenza A by PCR NEGATIVE NEGATIVE Final   Influenza B by PCR NEGATIVE NEGATIVE Final    Comment:  (NOTE) The Xpert Xpress SARS-CoV-2/FLU/RSV plus assay is intended as an aid in the diagnosis of influenza from Nasopharyngeal swab specimens and should not be used as a sole basis for treatment. Nasal washings and aspirates are unacceptable for Xpert Xpress SARS-CoV-2/FLU/RSV testing.  Fact Sheet for Patients: BloggerCourse.com  Fact Sheet for Healthcare Providers: SeriousBroker.it  This test is not yet approved or cleared by the [place] FDA and has been authorized for detection and/or diagnosis of SARS-CoV-2 by FDA under an Emergency Use Authorization (EUA). This EUA will remain in effect (meaning this test can be used) for the duration of the COVID-19 declaration under Section 564(b)(1) of the Act, 21 U.S.C. section 360bbb-3(b)(1), unless the authorization is terminated or revoked.  Performed at [facility], [address], [place], [place] [address]   MRSA PCR Screening     Status: Abnormal   Collection Time: 03/08/20  5:19 PM   Specimen: Nasal Mucosa; Nasopharyngeal  Result Value Ref Range Status   MRSA by PCR POSITIVE (  A) NEGATIVE Final    Comment:        The GeneXpert MRSA Assay (FDA approved for NASAL specimens only), is one component of a comprehensive MRSA colonization surveillance program. It is not intended to diagnose MRSA infection nor to guide or monitor treatment for MRSA infections. RESULT CALLED TO, READ BACK BY AND VERIFIED WITH: A COOPER RN 2135 03/08/20 A BROWNING Performed at [facility], [address] [place], [place] [address]       Studies: DG Chest 2 View  Result Date: 03/07/2020 CLINICAL DATA:  Chest pain and shortness of breath. EXAM: CHEST - 2 VIEW COMPARISON:  Chest x-ray dated January 16, 2020. FINDINGS: Unchanged mild cardiomegaly. New pulmonary vascular congestion and diffuse interstitial thickening with small right pleural effusion. Mild right basilar atelectasis.  No pneumothorax. No acute osseous abnormality. IMPRESSION: 1. New mild congestive heart failure. Electronically Signed   By: Obie Dredge M.D.   On: 03/07/2020 17:01   CT Angio Chest PE W and/or Wo Contrast  Result Date: 03/08/2020 CLINICAL DATA:  Generalized weakness EXAM: CT ANGIOGRAPHY CHEST WITH CONTRAST TECHNIQUE: Multidetector CT imaging of the chest was performed using the standard protocol during bolus administration of intravenous contrast. Multiplanar CT image reconstructions and MIPs were obtained to evaluate the vascular anatomy. CONTRAST:  OMNIPAQUE IOHEXOL 350 MG/ML SOLN COMPARISON:  None. FINDINGS: Cardiovascular: There is a optimal opacification of the pulmonary arteries. There is partially occlusive thrombus seen in the posterior right lower lobe and right middle lobe segmental and subsegmental arterial branches. There is also partially occlusive thrombus in the left lower lobe subsegmental arterial branches. There is mild cardiomegaly. No pericardial effusion or thickening. No evidence right heart strain. There is normal three-vessel brachiocephalic anatomy without proximal stenosis. Scattered aortic atherosclerosis is noted. Mediastinum/Nodes: No hilar, mediastinal, or axillary adenopathy. Thyroid gland, trachea, and esophagus demonstrate no significant findings. Lungs/Pleura: A small right pleural effusion is present. There is patchy airspace opacity seen at the right lung base with ground-glass opacities. There is also a small amount of ground-glass opacity seen at the left lung base. Upper Abdomen: No acute abnormalities present in the visualized portions of the upper abdomen. Musculoskeletal: No chest wall abnormality. No acute or significant osseous findings. Review of the MIP images confirms the above findings. IMPRESSION: Partially occlusive thrombus within segmental and subsegmental arterial branches within the bilateral lungs as described above. No evidence of right  ventricular heart strain. Small right pleural effusion Bilateral patchy ground-glass opacity seen within both lung bases right greater than left which could be due to atelectasis, or infectious etiology. Aortic Atherosclerosis (ICD10-I70.0). These results were called by telephone at the time of interpretation on 03/08/2020 at 1:56 am to provider Geoffery Lyons , who verbally acknowledged these results. Electronically Signed   By: Jonna Clark M.D.   On: 03/08/2020 02:00   ECHOCARDIOGRAM COMPLETE  Result Date: 03/08/2020    ECHOCARDIOGRAM REPORT   Patient Name:   TERANCE POMPLUN Date of Exam: 03/08/2020 Medical Rec #:  [id number]       Height:       74.0 in Accession #:    [id number]      Weight:       270.0 lb Date of Birth:  1960/07/19        BSA:          2.469 m Patient Age:    60 years        BP:  113/87 mmHg Patient Gender: M               HR:           111 bpm. Exam Location:  Inpatient Procedure: 2D Echo, Cardiac Doppler and Color Doppler Indications:    Atrial fibrillation  History:        Patient has no prior history of Echocardiogram examinations.                 COPD, Signs/Symptoms:Shortness of Breath; Risk                 Factors:Hypertension. H/O CVA.  Sonographer:    Ross Ludwig RDCS (AE) Referring Phys: [id number] Claudean Severance CHOTINER IMPRESSIONS  1. Left ventricular ejection fraction, by estimation, is 40 to 45%. The left ventricle has mildly decreased function. The left ventricle demonstrates global hypokinesis. There is severe left ventricular hypertrophy. Left ventricular diastolic function could not be evaluated.  2. Right ventricular systolic function is mildly reduced. The right ventricular size is normal. There is moderately elevated pulmonary artery systolic pressure. The estimated right ventricular systolic pressure is 46.8 mmHg.  3. Left atrial size was severely dilated.  4. Right atrial size was severely dilated.  5. The pericardial effusion is localized near the right atrium.  6. The  mitral valve is abnormal. Mild to moderate mitral valve regurgitation. Moderate mitral annular calcification.  7. The aortic valve is tricuspid. Aortic valve regurgitation is trivial. Aortic regurgitation PHT measures 449 msec. Aortic valve mean gradient measures 4.7 mmHg.  8. Aortic dilatation noted. There is mild dilatation of the aortic root, measuring 40 mm.  9. The inferior vena cava is dilated in size with <50% respiratory variability, suggesting right atrial pressure of 15 mmHg. Comparison(s): No prior Echocardiogram. FINDINGS  Left Ventricle: Left ventricular ejection fraction, by estimation, is 40 to 45%. The left ventricle has mildly decreased function. The left ventricle demonstrates global hypokinesis. The left ventricular internal cavity size was normal in size. There is  severe left ventricular hypertrophy. Left ventricular diastolic function could not be evaluated due to atrial fibrillation. Left ventricular diastolic function could not be evaluated. Right Ventricle: The right ventricular size is normal. No increase in right ventricular wall thickness. Right ventricular systolic function is mildly reduced. There is moderately elevated pulmonary artery systolic pressure. The tricuspid regurgitant velocity is 2.82 m/s, and with an assumed right atrial pressure of 15 mmHg, the estimated right ventricular systolic pressure is 46.8 mmHg. Left Atrium: Left atrial size was severely dilated. Right Atrium: Right atrial size was severely dilated. Pericardium: Trivial pericardial effusion is present. The pericardial effusion is localized near the right atrium. Mitral Valve: The mitral valve is abnormal. There is mild thickening of the mitral valve leaflet(s). Moderate mitral annular calcification. Mild to moderate mitral valve regurgitation. Tricuspid Valve: The tricuspid valve is not well visualized. Tricuspid valve regurgitation is trivial. Aortic Valve: The aortic valve is tricuspid. Aortic valve  regurgitation is trivial. Aortic regurgitation PHT measures 449 msec. Aortic valve mean gradient measures 4.7 mmHg. Aortic valve peak gradient measures 7.5 mmHg. Aortic valve area, by VTI measures  3.10 cm. Pulmonic Valve: The pulmonic valve was grossly normal. Pulmonic valve regurgitation is not visualized. Aorta: Aortic dilatation noted. There is mild dilatation of the aortic root, measuring 40 mm. Venous: The inferior vena cava is dilated in size with less than 50% respiratory variability, suggesting right atrial pressure of 15 mmHg. IAS/Shunts: No atrial level shunt detected by color flow Doppler.  LEFT VENTRICLE PLAX 2D LVIDd:         5.10 cm LVIDs:         4.40 cm LV PW:         1.70 cm LV IVS:        2.00 cm LVOT diam:     2.50 cm      3D Volume EF: LV SV:         68           3D EF:        40 % LV SV Index:   28           LV EDV:       223 ml LVOT Area:     4.91 cm     LV ESV:       135 ml                             LV SV:        89 ml  LV Volumes (MOD) LV vol d, MOD A2C: 171.0 ml LV vol d, MOD A4C: 176.0 ml LV vol s, MOD A2C: 88.9 ml LV vol s, MOD A4C: 104.0 ml LV SV MOD A2C:     82.1 ml LV SV MOD A4C:     176.0 ml LV SV MOD BP:      75.7 ml RIGHT VENTRICLE            IVC RV Basal diam:  4.60 cm    IVC diam: 2.50 cm RV Mid diam:    3.50 cm RV S prime:     9.03 cm/s TAPSE (M-mode): 1.2 cm LEFT ATRIUM              Index       RIGHT ATRIUM           Index LA diam:        5.30 cm  2.15 cm/m  RA Area:     32.20 cm LA Vol (A2C):   138.0 ml 55.89 ml/m RA Volume:   123.00 ml 49.82 ml/m LA Vol (A4C):   154.0 ml 62.37 ml/m LA Biplane Vol: 158.0 ml 63.99 ml/m  AORTIC VALVE AV Area (Vmax):    3.48 cm AV Area (Vmean):   3.19 cm AV Area (VTI):     3.10 cm AV Vmax:           137.00 cm/s AV Vmean:          104.367 cm/s AV VTI:            0.219 m AV Peak Grad:      7.5 mmHg AV Mean Grad:      4.7 mmHg LVOT Vmax:         97.07 cm/s LVOT Vmean:        67.867 cm/s LVOT VTI:          0.138 m LVOT/AV VTI ratio: 0.63  AI PHT:            449 msec  AORTA Ao Root diam: 3.50 cm Ao Asc diam:  4.00 cm MR Peak grad:    82.1 mmHg   TRICUSPID VALVE MR Mean grad:    51.0 mmHg   TR Peak grad:   31.8 mmHg MR Vmax:         453.00 cm/s TR Vmax:        282.00 cm/s MR Vmean:  331.0 cm/s MR PISA:         2.26 cm    SHUNTS MR PISA Eff ROA: 19 mm      Systemic VTI:  0.14 m MR PISA Radius:  0.60 cm     Systemic Diam: 2.50 cm Zoila ShutterKenneth Hilty MD Electronically signed by Zoila ShutterKenneth Hilty MD Signature Date/Time: 03/08/2020/12:38:34 PM    Final    VAS US LOWER EXTREMITY VENOUS (DVT)  Result Date: 03/08/2020  Lower Venous DVT Study Indications: Swelling.  Risk Factors: None identified. Comparison Study: No previous Performing Technologist: Clint GuyLisa Gibson RVT  Examination Guidelines: A complete evaluation includes B-mode imaging, spectral Doppler, color Doppler, and power Doppler as needed of all accessible portions of each vessel. Bilateral testing is considered an integral part of a complete examination. Limited examinations for reoccurring indications may be performed as noted. The reflux portion of the exam is performed with the patient in reverse Trendelenburg.  +---------+---------------+---------+-----------+----------+--------------+ RIGHT    CompressibilityPhasicitySpontaneityPropertiesThrombus Aging +---------+---------------+---------+-----------+----------+--------------+ CFV      Full           Yes      Yes                                 +---------+---------------+---------+-----------+----------+--------------+ SFJ      Full                                                        +---------+---------------+---------+-----------+----------+--------------+ FV Prox  Full                                                        +---------+---------------+---------+-----------+----------+--------------+ FV Mid   Full                                                         +---------+---------------+---------+-----------+----------+--------------+ FV DistalFull                                                        +---------+---------------+---------+-----------+----------+--------------+ PFV      Full                                                        +---------+---------------+---------+-----------+----------+--------------+ POP      Full           Yes      Yes                                 +---------+---------------+---------+-----------+----------+--------------+ PTV      Full                                                        +---------+---------------+---------+-----------+----------+--------------+  PERO     Full                                                        +---------+---------------+---------+-----------+----------+--------------+ Soleal   Full                                                        +---------+---------------+---------+-----------+----------+--------------+ Gastroc  Full                                                        +---------+---------------+---------+-----------+----------+--------------+   +---------+---------------+---------+-----------+----------+--------------+ LEFT     CompressibilityPhasicitySpontaneityPropertiesThrombus Aging +---------+---------------+---------+-----------+----------+--------------+ CFV      Full           Yes      Yes                                 +---------+---------------+---------+-----------+----------+--------------+ SFJ      Full                                                        +---------+---------------+---------+-----------+----------+--------------+ FV Prox  Full                                                        +---------+---------------+---------+-----------+----------+--------------+ FV Mid   Full                                                         +---------+---------------+---------+-----------+----------+--------------+ FV DistalFull                                                        +---------+---------------+---------+-----------+----------+--------------+ PFV      Full                                                        +---------+---------------+---------+-----------+----------+--------------+ POP      Full           Yes      Yes                                 +---------+---------------+---------+-----------+----------+--------------+  PTV      Full                                                        +---------+---------------+---------+-----------+----------+--------------+ PERO     Full                                                        +---------+---------------+---------+-----------+----------+--------------+     Summary: BILATERAL: - No evidence of deep vein thrombosis seen in the lower extremities, bilaterally. -No evidence of popliteal cyst, bilaterally.   *See table(s) above for measurements and observations.    Preliminary      Scheduled Meds: . amiodarone  200 mg Oral Daily  . aspirin EC  81 mg Oral Daily  . atorvastatin  20 mg Oral Daily  . carvedilol  12.5 mg Oral BID WC  . Chlorhexidine Gluconate Cloth  6 each Topical Q0600  . enoxaparin (LOVENOX) injection  120 mg Subcutaneous BID  . furosemide  40 mg Intravenous BID  . mometasone-formoterol  2 puff Inhalation BID  . mupirocin ointment  1 application Nasal BID  . spironolactone  25 mg Oral Daily   Continuous Infusions: . lactated ringers Stopped (03/09/20 1100)    Principal Problem:   Atrial fibrillation with RVR (HCC) Active Problems:   Pulmonary embolism (HCC)   Essential hypertension   COPD (chronic obstructive pulmonary disease) (HCC)   Anemia     Aidyn Kellis Tublu Ikeisha Blumberg, [facility]  If 7PM-7AM, please contact night-coverage [email or web address] Password [email or web address] 03/09/2020, 3:08 PM    LOS: 1 day

## 2020-03-10 DIAGNOSIS — I4891 Unspecified atrial fibrillation: Secondary | ICD-10-CM | POA: Diagnosis not present

## 2020-03-10 LAB — CBC
HCT: 31.8 % — ABNORMAL LOW (ref 39.0–52.0)
Hemoglobin: 10.2 g/dL — ABNORMAL LOW (ref 13.0–17.0)
MCH: 25.7 pg — ABNORMAL LOW (ref 26.0–34.0)
MCHC: 32.1 g/dL (ref 30.0–36.0)
MCV: 80.1 fL (ref 80.0–100.0)
Platelets: 206 10*3/uL (ref 150–400)
RBC: 3.97 MIL/uL — ABNORMAL LOW (ref 4.22–5.81)
RDW: 17.9 % — ABNORMAL HIGH (ref 11.5–15.5)
WBC: 7.6 10*3/uL (ref 4.0–10.5)
nRBC: 0 % (ref 0.0–0.2)

## 2020-03-10 LAB — BASIC METABOLIC PANEL
Anion gap: 10 (ref 5–15)
BUN: 11 mg/dL (ref 6–20)
CO2: 25 mmol/L (ref 22–32)
Calcium: 8.4 mg/dL — ABNORMAL LOW (ref 8.9–10.3)
Chloride: 96 mmol/L — ABNORMAL LOW (ref 98–111)
Creatinine, Ser: 0.97 mg/dL (ref 0.61–1.24)
GFR, Estimated: >60 mL/min (ref >60)
Glucose, Bld: 133 mg/dL — ABNORMAL HIGH (ref 70–99)
Potassium: 3.7 mmol/L (ref 3.5–5.1)
Sodium: 131 mmol/L — ABNORMAL LOW (ref 135–145)

## 2020-03-10 LAB — HEMOGLOBIN A1C
Hgb A1c MFr Bld: 7.4 % — ABNORMAL HIGH (ref 4.8–5.6)
Mean Plasma Glucose: 165.68 mg/dL

## 2020-03-10 NOTE — Progress Notes (Addendum)
Progress Note  Patient Name: Donald [name] Date of Encounter: 03/10/2020  Primary Cardiologist:   No primary care provider on file.   Subjective   He feels better, stronger.  He slept for 6 hours.   Inpatient Medications    Scheduled Meds: . amiodarone  200 mg Oral Daily  . aspirin EC  81 mg Oral Daily  . atorvastatin  20 mg Oral Daily  . carvedilol  12.5 mg Oral BID WC  . Chlorhexidine Gluconate Cloth  6 each Topical Q0600  . enoxaparin (LOVENOX) injection  120 mg Subcutaneous BID  . furosemide  40 mg Intravenous BID  . mometasone-formoterol  2 puff Inhalation BID  . mupirocin ointment  1 application Nasal BID  . spironolactone  25 mg Oral Daily   Continuous Infusions: . lactated ringers Stopped (03/09/20 1100)   PRN Meds: acetaminophen, albuterol, melatonin, ondansetron (ZOFRAN) IV   Vital Signs    Vitals:   03/09/20 1928 03/09/20 2300 03/10/20 0300 03/10/20 0711  BP:  108/86 (!) 123/97 112/84  Pulse: 85 93 94 (!) 112  Resp: 19 (!) 22 (!) 25 (!) 23  Temp:  97.6 F (36.4 C) 98.6 F (37 C) 97.6 F (36.4 C)  TempSrc:  Axillary Axillary Oral  SpO2: 98% 98% 95%   Weight:        Intake/Output Summary (Last 24 hours) at 03/10/2020 0733 Last data filed at 03/10/2020 0300 Gross per 24 hour  Intake 828.34 ml  Output 2600 ml  Net -1771.66 ml   Filed Weights   03/08/20 0349  Weight: 122.5 kg    Telemetry    Atrial fib with rate 90s with short runs of 120s- Personally Reviewed  ECG    NA - Personally Reviewed  Physical Exam   GEN: No  acute distress.   Neck: No  JVD Cardiac: Irregular RR, no murmurs, rubs, or gallops.  Respiratory: Clear   to auscultation bilaterally. GI: Soft, nontender, non-distended, normal bowel sounds  MS:  Mild edema; No deformity. Neuro:   Nonfocal  Psych: Oriented and appropriate    Labs    Chemistry Recent Labs  Lab 03/08/20 0506 03/09/20 0152 03/10/20 0105  NA 133* 132* 131*  K 4.2 4.2 3.7  CL 97* 97* 96*   CO2 25 24 25   GLUCOSE 146* 188* 133*  BUN 7 10 11   CREATININE 0.91 0.98 0.97  CALCIUM 8.4* 8.3* 8.4*  ALBUMIN  --  2.5*  --   GFRNONAA >60 >60 >60  ANIONGAP 11 11 10      Hematology Recent Labs  Lab 03/08/20 0506 03/09/20 0152 03/10/20 0105  WBC 9.3 8.5 7.6  RBC 4.34 4.11* 3.97*  HGB 11.2* 10.4* 10.2*  HCT 34.9* 32.4* 31.8*  MCV 80.4 78.8* 80.1  MCH 25.8* 25.3* 25.7*  MCHC 32.1 32.1 32.1  RDW 17.8* 17.6* 17.9*  PLT 246 238 206    Cardiac EnzymesNo results for input(s): TROPONINI in the last 168 hours. No results for input(s): TROPIPOC in the last 168 hours.   BNP Recent Labs  Lab 03/08/20 0116  BNP 334.2*     DDimer No results for input(s): DDIMER in the last 168 hours.   Radiology    ECHOCARDIOGRAM COMPLETE  Result Date: 03/08/2020    ECHOCARDIOGRAM REPORT   Patient Name:   Donald [name] Date of Exam: 03/08/2020 Medical Rec #:  [id number]       Height:       74.0 in Accession #:  [id number]      Weight:       270.0 lb Date of Birth:  [birth year]        BSA:          2.469 m Patient Age:    60 years        BP:           113/87 mmHg Patient Gender: M               HR:           111 bpm. Exam Location:  Inpatient Procedure: 2D Echo, Cardiac Doppler and Color Doppler Indications:    Atrial fibrillation  History:        Patient has no prior history of Echocardiogram examinations.                 COPD, Signs/Symptoms:Shortness of Breath; Risk                 Factors:Hypertension. H/O CVA.  Sonographer:    Ross Ludwig RDCS (AE) Referring Phys: [id number] Claudean Severance CHOTINER IMPRESSIONS  1. Left ventricular ejection fraction, by estimation, is 40 to 45%. The left ventricle has mildly decreased function. The left ventricle demonstrates global hypokinesis. There is severe left ventricular hypertrophy. Left ventricular diastolic function could not be evaluated.  2. Right ventricular systolic function is mildly reduced. The right ventricular size is normal. There is moderately elevated  pulmonary artery systolic pressure. The estimated right ventricular systolic pressure is 46.8 mmHg.  3. Left atrial size was severely dilated.  4. Right atrial size was severely dilated.  5. The pericardial effusion is localized near the right atrium.  6. The mitral valve is abnormal. Mild to moderate mitral valve regurgitation. Moderate mitral annular calcification.  7. The aortic valve is tricuspid. Aortic valve regurgitation is trivial. Aortic regurgitation PHT measures 449 msec. Aortic valve mean gradient measures 4.7 mmHg.  8. Aortic dilatation noted. There is mild dilatation of the aortic root, measuring 40 mm.  9. The inferior vena cava is dilated in size with <50% respiratory variability, suggesting right atrial pressure of 15 mmHg. Comparison(s): No prior Echocardiogram. FINDINGS  Left Ventricle: Left ventricular ejection fraction, by estimation, is 40 to 45%. The left ventricle has mildly decreased function. The left ventricle demonstrates global hypokinesis. The left ventricular internal cavity size was normal in size. There is  severe left ventricular hypertrophy. Left ventricular diastolic function could not be evaluated due to atrial fibrillation. Left ventricular diastolic function could not be evaluated. Right Ventricle: The right ventricular size is normal. No increase in right ventricular wall thickness. Right ventricular systolic function is mildly reduced. There is moderately elevated pulmonary artery systolic pressure. The tricuspid regurgitant velocity is 2.82 m/s, and with an assumed right atrial pressure of 15 mmHg, the estimated right ventricular systolic pressure is 46.8 mmHg. Left Atrium: Left atrial size was severely dilated. Right Atrium: Right atrial size was severely dilated. Pericardium: Trivial pericardial effusion is present. The pericardial effusion is localized near the right atrium. Mitral Valve: The mitral valve is abnormal. There is mild thickening of the mitral valve  leaflet(s). Moderate mitral annular calcification. Mild to moderate mitral valve regurgitation. Tricuspid Valve: The tricuspid valve is not well visualized. Tricuspid valve regurgitation is trivial. Aortic Valve: The aortic valve is tricuspid. Aortic valve regurgitation is trivial. Aortic regurgitation PHT measures 449 msec. Aortic valve mean gradient measures 4.7 mmHg. Aortic valve peak gradient measures 7.5 mmHg. Aortic valve area, by VTI measures  3.10 cm. Pulmonic Valve: The pulmonic valve was grossly normal. Pulmonic valve regurgitation is not visualized. Aorta: Aortic dilatation noted. There is mild dilatation of the aortic root, measuring 40 mm. Venous: The inferior vena cava is dilated in size with less than 50% respiratory variability, suggesting right atrial pressure of 15 mmHg. IAS/Shunts: No atrial level shunt detected by color flow Doppler.  LEFT VENTRICLE PLAX 2D LVIDd:         5.10 cm LVIDs:         4.40 cm LV PW:         1.70 cm LV IVS:        2.00 cm LVOT diam:     2.50 cm      3D Volume EF: LV SV:         68           3D EF:        40 % LV SV Index:   28           LV EDV:       223 ml LVOT Area:     4.91 cm     LV ESV:       135 ml                             LV SV:        89 ml  LV Volumes (MOD) LV vol d, MOD A2C: 171.0 ml LV vol d, MOD A4C: 176.0 ml LV vol s, MOD A2C: 88.9 ml LV vol s, MOD A4C: 104.0 ml LV SV MOD A2C:     82.1 ml LV SV MOD A4C:     176.0 ml LV SV MOD BP:      75.7 ml RIGHT VENTRICLE            IVC RV Basal diam:  4.60 cm    IVC diam: 2.50 cm RV Mid diam:    3.50 cm RV S prime:     9.03 cm/s TAPSE (M-mode): 1.2 cm LEFT ATRIUM              Index       RIGHT ATRIUM           Index LA diam:        5.30 cm  2.15 cm/m  RA Area:     32.20 cm LA Vol (A2C):   138.0 ml 55.89 ml/m RA Volume:   123.00 ml 49.82 ml/m LA Vol (A4C):   154.0 ml 62.37 ml/m LA Biplane Vol: 158.0 ml 63.99 ml/m  AORTIC VALVE AV Area (Vmax):    3.48 cm AV Area (Vmean):   3.19 cm AV Area (VTI):     3.10 cm  AV Vmax:           137.00 cm/s AV Vmean:          104.367 cm/s AV VTI:            0.219 m AV Peak Grad:      7.5 mmHg AV Mean Grad:      4.7 mmHg LVOT Vmax:         97.07 cm/s LVOT Vmean:        67.867 cm/s LVOT VTI:          0.138 m LVOT/AV VTI ratio: 0.63 AI PHT:            449 msec  AORTA Ao Root diam:  3.50 cm Ao Asc diam:  4.00 cm MR Peak grad:    82.1 mmHg   TRICUSPID VALVE MR Mean grad:    51.0 mmHg   TR Peak grad:   31.8 mmHg MR Vmax:         453.00 cm/s TR Vmax:        282.00 cm/s MR Vmean:        331.0 cm/s MR PISA:         2.26 cm    SHUNTS MR PISA Eff ROA: 19 mm      Systemic VTI:  0.14 m MR PISA Radius:  0.60 cm     Systemic Diam: 2.50 cm Zoila Shutter MD Electronically signed by Zoila Shutter MD Signature Date/Time: 03/08/2020/12:38:34 PM    Final    VAS [place] LOWER EXTREMITY VENOUS (DVT)  Result Date: 03/09/2020  Lower Venous DVT Study Indications: Swelling.  Risk Factors: None identified. Comparison Study: No previous Performing Technologist: Clint Guy RVT  Examination Guidelines: A complete evaluation includes B-mode imaging, spectral Doppler, color Doppler, and power Doppler as needed of all accessible portions of each vessel. Bilateral testing is considered an integral part of a complete examination. Limited examinations for reoccurring indications may be performed as noted. The reflux portion of the exam is performed with the patient in reverse Trendelenburg.  +---------+---------------+---------+-----------+----------+--------------+ RIGHT    CompressibilityPhasicitySpontaneityPropertiesThrombus Aging +---------+---------------+---------+-----------+----------+--------------+ CFV      Full           Yes      Yes                                 +---------+---------------+---------+-----------+----------+--------------+ SFJ      Full                                                        +---------+---------------+---------+-----------+----------+--------------+ FV Prox   Full                                                        +---------+---------------+---------+-----------+----------+--------------+ FV Mid   Full                                                        +---------+---------------+---------+-----------+----------+--------------+ FV DistalFull                                                        +---------+---------------+---------+-----------+----------+--------------+ PFV      Full                                                        +---------+---------------+---------+-----------+----------+--------------+  POP      Full           Yes      Yes                                 +---------+---------------+---------+-----------+----------+--------------+ PTV      Full                                                        +---------+---------------+---------+-----------+----------+--------------+ PERO     Full                                                        +---------+---------------+---------+-----------+----------+--------------+ Soleal   Full                                                        +---------+---------------+---------+-----------+----------+--------------+ Gastroc  Full                                                        +---------+---------------+---------+-----------+----------+--------------+   +---------+---------------+---------+-----------+----------+--------------+ LEFT     CompressibilityPhasicitySpontaneityPropertiesThrombus Aging +---------+---------------+---------+-----------+----------+--------------+ CFV      Full           Yes      Yes                                 +---------+---------------+---------+-----------+----------+--------------+ SFJ      Full                                                        +---------+---------------+---------+-----------+----------+--------------+ FV Prox  Full                                                         +---------+---------------+---------+-----------+----------+--------------+ FV Mid   Full                                                        +---------+---------------+---------+-----------+----------+--------------+ FV DistalFull                                                        +---------+---------------+---------+-----------+----------+--------------+  PFV      Full                                                        +---------+---------------+---------+-----------+----------+--------------+ POP      Full           Yes      Yes                                 +---------+---------------+---------+-----------+----------+--------------+ PTV      Full                                                        +---------+---------------+---------+-----------+----------+--------------+ PERO     Full                                                        +---------+---------------+---------+-----------+----------+--------------+     Summary: BILATERAL: - No evidence of deep vein thrombosis seen in the lower extremities, bilaterally. -No evidence of popliteal cyst, bilaterally.   *See table(s) above for measurements and observations. Electronically signed by Coral Else MD on 03/09/2020 at 7:59:57 PM.    Final     Cardiac Studies   ECHO  1. Left ventricular ejection fraction, by estimation, is 40 to 45%. The  left ventricle has mildly decreased function. The left ventricle  demonstrates global hypokinesis. There is severe left ventricular  hypertrophy. Left ventricular diastolic function  could not be evaluated.  2. Right ventricular systolic function is mildly reduced. The right  ventricular size is normal. There is moderately elevated pulmonary artery  systolic pressure. The estimated right ventricular systolic pressure is  46.8 mmHg.  3. Left atrial size was severely dilated.  4. Right atrial size was severely dilated.  5. The  pericardial effusion is localized near the right atrium.  6. The mitral valve is abnormal. Mild to moderate mitral valve  regurgitation. Moderate mitral annular calcification.  7. The aortic valve is tricuspid. Aortic valve regurgitation is trivial.  Aortic regurgitation PHT measures 449 msec. Aortic valve mean gradient  measures 4.7 mmHg.  8. Aortic dilatation noted. There is mild dilatation of the aortic root,  measuring 40 mm.  9. The inferior vena cava is dilated in size with <50% respiratory  variability, suggesting right atrial pressure of 15 mmHg.   Patient Profile     60 y.o. male with a history of chronic systolic CHF with EF of 40-45%, persistent atrial fibrillation/paroxysmal atrial flutter s/p flutter ablation on Eliquis 5mg  twice daily, CVA in 2018 when non-compliant with anticoagulation, COPD, obstructive sleep apnea not on CPAP, hypertension, hyperlipidemia, morbid obesity, medication non-compliance, and former tobacco abuse (quit in 2019 or 2020) who is being seen for the evaluation of atrial fibrillation at the request of Dr. Alanda Slim.   Assessment & Plan    Atrial fib with RVR:    Stopped Cardizem and increased beta blocker yesterday.   I would suggest  TEE/DCCV for Tuesday.  We will need to schedule this on Monday.    Acute on chronic combined HF:   Net negative 1.6 liters yesterday.   Continue current Lasix.    PE:  Acute this admission.  Resume Eliquis per the primary team today.    HTN:  BP is being managed in the context of managing the above.    DIET:  He would like to consult with a dietician to talk about low salt and I would suggest low carb   For questions or updates, please contact [facility] Please consult [email or web address] for contact info under Cardiology/STEMI.   Signed, Rollene RotundaJames Duron Meister, MD  03/10/2020, 7:33 AM

## 2020-03-10 NOTE — Progress Notes (Signed)
PROGRESS NOTE    Donald [name]  DUK:[id number]  DOB: April 30, 1960  DOA: 03/07/2020 PCP: Renaye Rakers, MD Outpatient Specialists:   Hospital course:  60 year old male with COPD, A. fib on Eliquis, CVA and HTN was admitted on 03/07/20 with bilateral PE and consequent RVR of his atrial fibrillation.  He was treated with Cardizem drip and full dose Lovenox.   Subjective:  Patient without any new concerns, no new events overnight.   Objective: Vitals:   03/09/20 2300 03/10/20 0300 03/10/20 0711 03/10/20 1155  BP: 108/86 (!) 123/97 112/84 (!) 107/95  Pulse: 93 94 (!) 112 (!) 120  Resp: (!) 22 (!) 25 20 20   Temp: 97.6 F (36.4 C) 98.6 F (37 C) 97.6 F (36.4 C) 97.9 F (36.6 C)  TempSrc: Axillary Axillary Oral Oral  SpO2: 98% 95% 94% 93%  Weight:        Intake/Output Summary (Last 24 hours) at 03/10/2020 1408 Last data filed at 03/10/2020 1200 Gross per 24 hour  Intake 828.34 ml  Output 3650 ml  Net -2821.66 ml   Filed Weights   03/08/20 0349  Weight: 122.5 kg     Exam:  General: Patient walking in hallway in nad.  Eyes: sclera anicteric, conjuctiva mild injection bilaterally CVS: S1-S2, regular  Respiratory: Reasonable air entry bilaterally without any wheezes.   GI: NABS, soft, NT  LE: No edema.  Neuro: grossly nonfocal.  Psych: mood and affect appropriate to situation.   Assessment & Plan:   60 year old man with COPD and atrial fibrillation was admitted with RVR likely provoked by bilateral PEs.  Patient had likely been noncompliant with his anticoagulation.  Atrial fibrillation with RVR Patient is presently rate controllef off diltiazem drip on carvedilol Cardiology is following and recommend TEE/DCCV on Tuesday, to be scheduled Monday Continue amiodarone  VTE/PE Continue full dose Lovenox CTA shows partially occlusive thrombi within segmental and slight segmental bilateral lungs. No RV strain. Can transition back to Eliquis upon  discharge  HFrEF Patient is diuresing well on Lasix per cardiology He has net 2 L out yesterday. Creatinine stable at 0.9  Hyperglycemia Random glucose has been elevated Hemoglobin A1c is 7.4 today This is a new diagnosis of diabetes, will consult diabetes coordinator  Anemia Minimal decrease in H&H Stable overnight, patient is hemodynamically stable  Elevated troponin To be secondary to demand ischemia  HTN Blood pressure is under reasonable control on carvedilol and spironolactone  OSA CPAP as needed  COPD No evidence for acute flare Continue Symbicort  History of embolic CVA Presently anticoagulated on full dose Lovenox Request to be restarted upon discharge   DVT prophylaxis: Full dose Eliquis Code Status: Full Family Communication: None today Disposition Plan:   Patient is from: Home  Anticipated Discharge Location: Home  Barriers to Discharge: Just got transitioned off of diltiazem drip today  Is patient medically stable for Discharge: Not yet   Consultants:  Cardiology  Procedures:  Echocardiogram  Antimicrobials:  None   Data Reviewed:  Basic Metabolic Panel: Recent Labs  Lab 03/07/20 1642 03/08/20 0506 03/08/20 0858 03/09/20 0152 03/10/20 0105  NA 134* 133*  --  132* 131*  K 4.2 4.2  --  4.2 3.7  CL 98 97*  --  97* 96*  CO2 25 25  --  24 25  GLUCOSE 144* 146*  --  188* 133*  BUN 7 7  --  10 11  CREATININE 0.93 0.91  --  0.98 0.97  CALCIUM 8.4* 8.4*  --  8.3* 8.4*  MG  --   --  1.8 1.9  --   PHOS  --   --   --  3.9  --    Liver Function Tests: Recent Labs  Lab 03/09/20 0152  ALBUMIN 2.5*   No results for input(s): LIPASE, AMYLASE in the last 168 hours. No results for input(s): AMMONIA in the last 168 hours. CBC: Recent Labs  Lab 03/07/20 1642 03/08/20 0506 03/09/20 0152 03/10/20 0105  WBC 9.8 9.3 8.5 7.6  HGB 11.7* 11.2* 10.4* 10.2*  HCT 36.8* 34.9* 32.4* 31.8*  MCV 81.4 80.4 78.8* 80.1  PLT 241 246 238 206    Cardiac Enzymes: No results for input(s): CKTOTAL, CKMB, CKMBINDEX, TROPONINI in the last 168 hours. BNP (last 3 results) No results for input(s): PROBNP in the last 8760 hours. CBG: No results for input(s): GLUCAP in the last 168 hours.  Recent Results (from the past 240 hour(s))  Resp Panel by RT-PCR (Flu A&B, Covid) Nasopharyngeal Swab     Status: None   Collection Time: 03/08/20  1:14 AM   Specimen: Nasopharyngeal Swab; Nasopharyngeal(NP) swabs in vial transport medium  Result Value Ref Range Status   SARS Coronavirus 2 by RT PCR NEGATIVE NEGATIVE Final    Comment: (NOTE) SARS-CoV-2 target nucleic acids are NOT DETECTED.  The SARS-CoV-2 RNA is generally detectable in upper respiratory specimens during the acute phase of infection. The lowest concentration of SARS-CoV-2 viral copies this assay can detect is 138 copies/mL. A negative result does not preclude SARS-Cov-2 infection and should not be used as the sole basis for treatment or other patient management decisions. A negative result may occur with  improper specimen collection/handling, submission of specimen other than nasopharyngeal swab, presence of viral mutation(s) within the areas targeted by this assay, and inadequate number of viral copies(<138 copies/mL). A negative result must be combined with clinical observations, patient history, and epidemiological information. The expected result is Negative.  Fact Sheet for Patients:  BloggerCourse.com  Fact Sheet for Healthcare Providers:  SeriousBroker.it  This test is no t yet approved or cleared by the [place] FDA and  has been authorized for detection and/or diagnosis of SARS-CoV-2 by FDA under an Emergency Use Authorization (EUA). This EUA will remain  in effect (meaning this test can be used) for the duration of the COVID-19 declaration under Section 564(b)(1) of the Act, 21 U.S.C.section 360bbb-3(b)(1),  unless the authorization is terminated  or revoked sooner.       Influenza A by PCR NEGATIVE NEGATIVE Final   Influenza B by PCR NEGATIVE NEGATIVE Final    Comment: (NOTE) The Xpert Xpress SARS-CoV-2/FLU/RSV plus assay is intended as an aid in the diagnosis of influenza from Nasopharyngeal swab specimens and should not be used as a sole basis for treatment. Nasal washings and aspirates are unacceptable for Xpert Xpress SARS-CoV-2/FLU/RSV testing.  Fact Sheet for Patients: BloggerCourse.com  Fact Sheet for Healthcare Providers: SeriousBroker.it  This test is not yet approved or cleared by the [place] FDA and has been authorized for detection and/or diagnosis of SARS-CoV-2 by FDA under an Emergency Use Authorization (EUA). This EUA will remain in effect (meaning this test can be used) for the duration of the COVID-19 declaration under Section 564(b)(1) of the Act, 21 U.S.C. section 360bbb-3(b)(1), unless the authorization is terminated or revoked.  Performed at [facility], [address], [place], [place] [address]   MRSA PCR Screening     Status: Abnormal   Collection  Time: 03/08/20  5:19 PM   Specimen: Nasal Mucosa; Nasopharyngeal  Result Value Ref Range Status   MRSA by PCR POSITIVE (A) NEGATIVE Final    Comment:        The GeneXpert MRSA Assay (FDA approved for NASAL specimens only), is one component of a comprehensive MRSA colonization surveillance program. It is not intended to diagnose MRSA infection nor to guide or monitor treatment for MRSA infections. RESULT CALLED TO, READ BACK BY AND VERIFIED WITH: A COOPER RN 2135 03/08/20 A BROWNING Performed at [facility], [address] [place], [place] [address]       Studies: No results found.   Scheduled Meds: . amiodarone  200 mg Oral Daily  . aspirin EC  81 mg Oral Daily  . atorvastatin  20 mg Oral Daily  . carvedilol  12.5 mg Oral  BID WC  . Chlorhexidine Gluconate Cloth  6 each Topical Q0600  . enoxaparin (LOVENOX) injection  120 mg Subcutaneous BID  . furosemide  40 mg Intravenous BID  . mometasone-formoterol  2 puff Inhalation BID  . mupirocin ointment  1 application Nasal BID  . spironolactone  25 mg Oral Daily   Continuous Infusions: . lactated ringers Stopped (03/09/20 1100)    Principal Problem:   Atrial fibrillation with RVR (HCC) Active Problems:   Pulmonary embolism (HCC)   Essential hypertension   COPD (chronic obstructive pulmonary disease) (HCC)   Anemia     Donald [name], [facility]  If 7PM-7AM, please contact night-coverage [email or web address] Password [email or web address] 03/10/2020, 2:08 PM    LOS: 2 days

## 2020-03-11 DIAGNOSIS — I5043 Acute on chronic combined systolic (congestive) and diastolic (congestive) heart failure: Secondary | ICD-10-CM

## 2020-03-11 DIAGNOSIS — I4891 Unspecified atrial fibrillation: Secondary | ICD-10-CM | POA: Diagnosis not present

## 2020-03-11 LAB — BASIC METABOLIC PANEL
Anion gap: 11 (ref 5–15)
BUN: 13 mg/dL (ref 6–20)
CO2: 27 mmol/L (ref 22–32)
Calcium: 8.2 mg/dL — ABNORMAL LOW (ref 8.9–10.3)
Chloride: 97 mmol/L — ABNORMAL LOW (ref 98–111)
Creatinine, Ser: 1.05 mg/dL (ref 0.61–1.24)
GFR, Estimated: >60 mL/min (ref >60)
Glucose, Bld: 135 mg/dL — ABNORMAL HIGH (ref 70–99)
Potassium: 3.8 mmol/L (ref 3.5–5.1)
Sodium: 135 mmol/L (ref 135–145)

## 2020-03-11 LAB — GLUCOSE, CAPILLARY
Glucose-Capillary: 127 mg/dL — ABNORMAL HIGH (ref 70–99)
Glucose-Capillary: 185 mg/dL — ABNORMAL HIGH (ref 70–99)

## 2020-03-11 LAB — PROTIME-INR
INR: 1.4 — ABNORMAL HIGH (ref 0.8–1.2)
Prothrombin Time: 16.1 seconds — ABNORMAL HIGH (ref 11.4–15.2)

## 2020-03-11 MED ORDER — APIXABAN 5 MG PO TABS
5.0000 mg | ORAL_TABLET | Freq: Two times a day (BID) | ORAL | Status: DC
Start: 2020-03-18 — End: 2020-03-12

## 2020-03-11 MED ORDER — SODIUM CHLORIDE 0.9 % IV SOLN: INTRAVENOUS | Status: DC

## 2020-03-11 MED ORDER — INSULIN ASPART 100 UNIT/ML ~~LOC~~ SOLN
0.0000 [IU] | Freq: Three times a day (TID) | SUBCUTANEOUS | Status: DC
  Administered 2020-03-11 – 2020-03-12 (×2): 2 [IU] via SUBCUTANEOUS

## 2020-03-11 MED ORDER — APIXABAN 5 MG PO TABS
10.0000 mg | ORAL_TABLET | Freq: Two times a day (BID) | ORAL | Status: DC
  Administered 2020-03-11 – 2020-03-12 (×2): 10 mg via ORAL
  Filled 2020-03-11 (×3): qty 2

## 2020-03-11 MED ORDER — SODIUM CHLORIDE 0.9 % IV SOLN
INTRAVENOUS | Status: DC
Start: 2020-03-11 — End: 2020-03-11

## 2020-03-11 MED ORDER — LIVING WELL WITH DIABETES BOOK
Freq: Once | Status: AC
  Filled 2020-03-11: qty 1

## 2020-03-11 NOTE — Plan of Care (Signed)
Nutrition Education Note  RD consulted for nutrition education regarding newly diagnosed type 2 diabetes.  Spoke with pt at length at bedside. Pt reports that he has a good appetite at baseline and has been eating well during admission. Pt reports frustration with not having carbohydrate information printed on his meal tray ticket. RD changed diet order to Heart Healthy/Carb Modified so that this information would be available to pt. Pt expresses appreciation and states that this will help him.  Pt reports that recently he has made some changes in his diet to promote intentional weight loss. Pt reports that he used to weigh 315 lbs. Pt shares that he is familiar with measuring out his food at home as he does this frequently.  Pt typically eats 3 meals daily. For breakfast he may have multi-grain toast with eggs or grits or oatmeal with toppings like nuts and dried fruit. For lunch or dinner, pt may have a bean-based dish or chicken tacos. Pt drinks Tang, 100% juice, water, and Gatorade. Pt drinks sodas on occasion.  Lab Results  Component Value Date   HGBA1C 7.4 (H) 03/10/2020    RD provided "Carbohydrate Counting for People with Diabetes" handout from the Academy of Nutrition and Dietetics. Discussed different food groups and their effects on blood sugar, emphasizing carbohydrate-containing foods. Provided list of carbohydrates and recommended serving sizes of common foods.  Discussed importance of controlled and consistent carbohydrate intake throughout the day. Provided examples of ways to balance meals/snacks and encouraged intake of high-fiber, whole grain complex carbohydrates. Teach back method used.  Expect good compliance.  Current diet order is Heart Healthy/Carb Modified, patient is consuming approximately 100% of meals at this time. Labs and medications reviewed. No further nutrition interventions warranted at this time. RD contact information provided. If additional nutrition issues  arise, please re-consult RD.   Mertie Clause, MS, RD, LDN Inpatient Clinical Dietitian Please see AMiON for contact information.

## 2020-03-11 NOTE — Plan of Care (Signed)

## 2020-03-11 NOTE — Progress Notes (Signed)
Progress Note  Patient Name: Donald [name] Date of Encounter: 03/11/2020  Primary Cardiologist: Dr. Rudolpho Sevin [facility]  Subjective   No CP, still wearing cpap mask but machine is off. No SOB, no palpitations.  Inpatient Medications    Scheduled Meds: . amiodarone  200 mg Oral Daily  . aspirin EC  81 mg Oral Daily  . atorvastatin  20 mg Oral Daily  . carvedilol  12.5 mg Oral BID WC  . Chlorhexidine Gluconate Cloth  6 each Topical Q0600  . enoxaparin (LOVENOX) injection  120 mg Subcutaneous BID  . furosemide  40 mg Intravenous BID  . mometasone-formoterol  2 puff Inhalation BID  . mupirocin ointment  1 application Nasal BID  . spironolactone  25 mg Oral Daily   Continuous Infusions: . lactated ringers Stopped (03/09/20 1100)   PRN Meds: acetaminophen, albuterol, melatonin, ondansetron (ZOFRAN) IV   Vital Signs    Vitals:   03/11/20 0100 03/11/20 0416 03/11/20 0742 03/11/20 0812  BP:  (!) 139/102 108/69   Pulse:  95 100 99  Resp: (!) 21 (!) 22 (!) 23   Temp:  99 F (37.2 C) 98.2 F (36.8 C)   TempSrc:  Oral Axillary   SpO2:  96% 97%   Weight:  133.9 kg      Intake/Output Summary (Last 24 hours) at 03/11/2020 0855 Last data filed at 03/11/2020 0527 Gross per 24 hour  Intake 480 ml  Output 4100 ml  Net -3620 ml   Filed Weights   03/08/20 0349 03/11/20 0416  Weight: 122.5 kg 133.9 kg    Telemetry    Afib RVR current rate 105 - Personally Reviewed  ECG    Afib rate 145 - Personally Reviewed  Physical Exam   GEN: No acute distress.   Neck: No JVD Cardiac: irregular rhythm, tachycardic, no murmurs, rubs, or gallops.  Respiratory: crackles in right base GI: Soft, nontender, non-distended  MS: trace pedal edema; No deformity. Neuro:  Nonfocal  Psych: Normal affect   Labs    Chemistry Recent Labs  Lab 03/09/20 0152 03/10/20 0105 03/11/20 0018  NA 132* 131* 135  K 4.2 3.7 3.8  CL 97* 96* 97*  CO2 24 25 27   GLUCOSE 188* 133* 135*  BUN 10 11 13    CREATININE 0.98 0.97 1.05  CALCIUM 8.3* 8.4* 8.2*  ALBUMIN 2.5*  --   --   GFRNONAA >60 >60 >60  ANIONGAP 11 10 11      Hematology Recent Labs  Lab 03/08/20 0506 03/09/20 0152 03/10/20 0105  WBC 9.3 8.5 7.6  RBC 4.34 4.11* 3.97*  HGB 11.2* 10.4* 10.2*  HCT 34.9* 32.4* 31.8*  MCV 80.4 78.8* 80.1  MCH 25.8* 25.3* 25.7*  MCHC 32.1 32.1 32.1  RDW 17.8* 17.6* 17.9*  PLT 246 238 206    Cardiac EnzymesNo results for input(s): TROPONINI in the last 168 hours. No results for input(s): TROPIPOC in the last 168 hours.   BNP Recent Labs  Lab 03/08/20 0116  BNP 334.2*     DDimer No results for input(s): DDIMER in the last 168 hours.   Radiology    No results found.  Cardiac Studies   IMPRESSIONS     1. Left ventricular ejection fraction, by estimation, is 40 to 45%. The  left ventricle has mildly decreased function. The left ventricle  demonstrates global hypokinesis. There is severe left ventricular  hypertrophy. Left ventricular diastolic function  could not be evaluated.   2. Right ventricular systolic function  is mildly reduced. The right  ventricular size is normal. There is moderately elevated pulmonary artery  systolic pressure. The estimated right ventricular systolic pressure is  46.8 mmHg.   3. Left atrial size was severely dilated.   4. Right atrial size was severely dilated.   5. The pericardial effusion is localized near the right atrium.   6. The mitral valve is abnormal. Mild to moderate mitral valve  regurgitation. Moderate mitral annular calcification.   7. The aortic valve is tricuspid. Aortic valve regurgitation is trivial.  Aortic regurgitation PHT measures 449 msec. Aortic valve mean gradient  measures 4.7 mmHg.   8. Aortic dilatation noted. There is mild dilatation of the aortic root,  measuring 40 mm.   9. The inferior vena cava is dilated in size with <50% respiratory  variability, suggesting right atrial pressure of 15 mmHg.    Patient  Profile   60 y.o. male with a history of chronic systolic CHF with EF of 40-45%, persistent atrial fibrillation/paroxysmal atrial flutter s/p flutter ablation on Eliquis 5mg  twice daily, CVA in 2018 when non-compliant with anticoagulation, COPD, obstructive sleep apnea not on CPAP, hypertension, hyperlipidemia, morbid obesity, medication non-compliance, and former tobacco abuse (quit in 2019 or 2020) who is being seen for the evaluation of atrial fibrillation at the request of Dr. 2021.   Assessment & Plan   Principal Problem:   Atrial fibrillation with RVR (HCC) Active Problems:   Pulmonary embolism (HCC)   Essential hypertension   COPD (chronic obstructive pulmonary disease) (HCC)   Anemia   Atrial fib with RVR:  -rates around 105 currently. - No longer on cardizem. - amiodarone 200 mg daily - carvedilol 12.5 mg BID - discussed with Dr. Alanda Slim and Maryfrances Bunnell pharmacist, would transition to eliquis today. Will use PE based dosing of eliquis 10 mg Bid for 7 days then 5 mg BID ongoing. Should receive 2 doses of eliquis 10 mg prior to cardioversion tomorrow. Based on pharmacokinetics of eliquis, two 10 mg doses should achieve adequate factor Xa inhibition suitable for cardioversion.  - tentatively scheduled for TEE/DCCV 1:00 pm 03/12/20.  - will stop aspirin as there is no clear indication for this in combination with eliquis.   Severe biatrial enlargement: may impact cardioversion success but will attempt.    Acute on chronic combined systolic and diastolic HF: Cr: 03/14/20 UOP yesterday: -4.15 L Weight: inaccurate weights. 133.9 kg Net negative for admission: -3.595 L Diuretic plan: continue lasix 40 mg IV BID for one additional day and consider transition tomorrow or Wednesday.  Heart Failure Therapy ACE-I/ARB/ARNI: consider adding before discharge BB: carvedilol 12.5 mg BID MRA: spironolactone 25 mg daily SGLT2I: consider adding as outpatient if EF remains low.  LVH:  echo report says severe but on my independent review this is likely overmeasured and may be more consistent with moderate LVH (15 mm wall thickness). May want to consider evaluation for cause of LVH particularly with reduced EF. Could consider outpatient cardiac MRI and protein electrophoresis.   Mild dilation of aorta: will need outpatient folllow up.    PE:  Acute this admission.  Resume Eliquis.   HTN:  BP is being managed in the context of managing the above.  BP normal at this time.     For questions or updates, please contact [facility] Please consult [email or web address] for contact info under        Signed, Friday, MD  03/11/2020, 8:55 AM

## 2020-03-11 NOTE — Progress Notes (Addendum)
Inpatient Diabetes Program Recommendations  AACE/ADA: New Consensus Statement on Inpatient Glycemic Control (2015)  Target Ranges:  Prepandial:   less than 140 mg/dL      Peak postprandial:   less than 180 mg/dL (1-2 hours)      Critically ill patients:  140 - 180 mg/dL   Lab Results  Component Value Date   GLUCAP 164 (H) 11/16/2014   HGBA1C 7.4 (H) 03/10/2020    Review of Glycemic Control Results for FED, CECI (MRN [id number]) as of 03/11/2020 10:00  Ref. Range 03/10/2020 01:05 03/11/2020 00:18  Glucose Latest Ref Range: 70 - 99 mg/dL 716 (H) 967 (H)   Diabetes history: New onset DM Outpatient Diabetes medications: none Current orders for Inpatient glycemic control: none  Inpatient Diabetes Program Recommendations:    Noted consult. Will plan to speak with patient.   Consider adding CBGs TID & HS.  Consult placed for dietitian and will order [facility] booklet.   Addendum: Spoke with patient regarding new onset DM.  Reviewed patient's current A1c of 7.4%. Explained what a A1c is and what it measures. Also reviewed goal A1c with patient, importance of good glucose control @ home, and blood sugar goals. Reviewed patho of DM, role of pancreas, target goals, survival skills, signs and symptoms of hypo vs hyper glycemia, impact of activity when able, vascular changes and commorbidities.   Patient will need a glucose meter at discharge. Blood glucose meter (inlcudes lancets and strips)(#[id number]). Reviewed recommended frequency of once per day at varying times and the importance of following up with PCP. Patient in agreement and plans to make follow up appointment. Encouraged to review LWWDM book and reinforced concepts discussed with the dietitian. No further questions at this time.   Thanks, Lujean Rave, MSN, RNC-OB Diabetes Coordinator [phone] (8a-5p)    Thanks, Lujean Rave, MSN, RNC-OB Diabetes Coordinator [phone] (8a-5p)

## 2020-03-11 NOTE — Progress Notes (Signed)
Donald [name] PROGRESS NOTE    Donald [name]  KWI:[id number] DOB: [birth year] DOA: 03/07/2020 PCP: Renaye Rakers, MD      Brief Narrative:  Donald [name] is a 60 y.o. M with persistent atrial fibrillation status post ablation on Eliquis, poor compliance, chronic systolic CHF, EF 24-26%, hx CVA 2018, COPD, OSA not on CPAP, HTN, and morbid obesity, who presented with shortness of breath.  In the ER he was found to have acute bilateral PE as well as A. fib with RVR.  Started on Cardizem drip and Lovenox.  She was consulted, partner was controlled with Cardizem, but the patient remains significantly symptomatic and DCCV was recommended.          Assessment & Plan:  Acute pulmonary embolism -Stop Lovenox -Resume Eliquis, as this appears to have been due to inconsistent Eliquis, not Eliquis failure -Use VTE Eliquis dosing    Atrial fibrillation with RVR -Continue diltiazem, amiodarone -Stop Lovenox, transition to Eliquis  Acute on chronic systolic and diastolic CHF Net negative 3.6L, Cr stable. -Continue IV Lasix -Continue atorvastatin -Continue carvedilol, spironolactone  Diabetes A1c 7.4%, glucoses good. -Start SS insulin  Anemia Hgb stable  Hypertension -Continue carvedilol, furosemide, spironolactone  OSA Not on CPAP  COPD No active flare -Continue ICS/LABA  History of embolic CVA        Disposition: Status is: Inpatient  Remains inpatient appropriate because:IV treatments appropriate due to intensity of illness or inability to take PO   Dispo: The patient is from: Home              Anticipated d/c is to: Home              Anticipated d/c date is: 2 days              Patient currently is not medically stable to d/c.   Difficult to place patient No       Level of care: Telemetry Cardiac       MDM: The below labs and imaging reports were reviewed and summarized above.  Medication management as above.Anticoagulant  managed.   DVT prophylaxis:  apixaban (ELIQUIS) tablet 10 mg  apixaban (ELIQUIS) tablet 5 mg  Code Status: FULL Family Communication: Mother by phone           Subjective: Patient still very weak and tired with exertion.  Still some pitting edema in the legs.  No chest pain, confusion, fever.  Objective: Vitals:   03/11/20 0742 03/11/20 0812 03/11/20 1118 03/11/20 1646  BP: 108/69  108/77 (!) 118/99  Pulse: 100 99 96 94  Resp: (!) 23  (!) 22 (!) 25  Temp: 98.2 F (36.8 C)  98.6 F (37 C) 99.1 F (37.3 C)  TempSrc: Axillary  Oral Axillary  SpO2: 97%  95% 98%  Weight:        Intake/Output Summary (Last 24 hours) at 03/11/2020 1737 Last data filed at 03/11/2020 1200 Gross per 24 hour  Intake -  Output 3250 ml  Net -3250 ml   Filed Weights   03/08/20 0349 03/11/20 0416  Weight: 122.5 kg 133.9 kg    Examination: General appearance:  adult male, alert and in no acute distress.   HEENT: Anicteric, conjunctiva pink, lids and lashes normal. No nasal deformity, discharge, epistaxis.  Lips moist.   Skin: Warm and dry.  No jaundice.  No suspicious rashes or lesions. Cardiac: Tachycardic, irregular, nl S1-S2, systolic murmur noted, capillary refill is brisk.  1+ LE edema.  Radial pulses 2+ and symmetric. Respiratory: Normal respiratory rate and rhythm.  CTAB without rales or wheezes. Abdomen: Abdomen soft.  No TTP or guarding. No ascites, distension, hepatosplenomegaly.   MSK: No deformities or effusions. Neuro: Awake and alert.  EOMI, moves all extremities. Speech fluent.    Psych: Sensorium intact and responding to questions, attention normal. Affect normal.  Judgment and insight appear normal.    Data Reviewed: I have personally reviewed following labs and imaging studies:  CBC: Recent Labs  Lab 03/07/20 1642 03/08/20 0506 03/09/20 0152 03/10/20 0105  WBC 9.8 9.3 8.5 7.6  HGB 11.7* 11.2* 10.4* 10.2*  HCT 36.8* 34.9* 32.4* 31.8*  MCV 81.4 80.4 78.8* 80.1   PLT 241 246 238 206   Basic Metabolic Panel: Recent Labs  Lab 03/07/20 1642 03/08/20 0506 03/08/20 0858 03/09/20 0152 03/10/20 0105 03/11/20 0018  NA 134* 133*  --  132* 131* 135  K 4.2 4.2  --  4.2 3.7 3.8  CL 98 97*  --  97* 96* 97*  CO2 25 25  --  24 25 27   GLUCOSE 144* 146*  --  188* 133* 135*  BUN 7 7  --  10 11 13   CREATININE 0.93 0.91  --  0.98 0.97 1.05  CALCIUM 8.4* 8.4*  --  8.3* 8.4* 8.2*  MG  --   --  1.8 1.9  --   --   PHOS  --   --   --  3.9  --   --    GFR: CrCl cannot be calculated (Unknown ideal weight.). Liver Function Tests: Recent Labs  Lab 03/09/20 0152  ALBUMIN 2.5*   No results for input(s): LIPASE, AMYLASE in the last 168 hours. No results for input(s): AMMONIA in the last 168 hours. Coagulation Profile: Recent Labs  Lab 03/11/20 1417  INR 1.4*   Cardiac Enzymes: No results for input(s): CKTOTAL, CKMB, CKMBINDEX, TROPONINI in the last 168 hours. BNP (last 3 results) No results for input(s): PROBNP in the last 8760 hours. HbA1C: Recent Labs    03/10/20 0105  HGBA1C 7.4*   CBG: Recent Labs  Lab 03/11/20 1648  GLUCAP 127*   Lipid Profile: No results for input(s): CHOL, HDL, LDLCALC, TRIG, CHOLHDL, LDLDIRECT in the last 72 hours. Thyroid Function Tests: No results for input(s): TSH, T4TOTAL, FREET4, T3FREE, THYROIDAB in the last 72 hours. Anemia Panel: No results for input(s): VITAMINB12, FOLATE, FERRITIN, TIBC, IRON, RETICCTPCT in the last 72 hours. Urine analysis:    Component Value Date/Time   COLORURINE AMBER (A) 03/08/2020 0106   APPEARANCEUR CLEAR 03/08/2020 0106   LABSPEC 1.019 03/08/2020 0106   PHURINE 6.0 03/08/2020 0106   GLUCOSEU NEGATIVE 03/08/2020 0106   HGBUR NEGATIVE 03/08/2020 0106   BILIRUBINUR SMALL (A) 03/08/2020 0106   KETONESUR NEGATIVE 03/08/2020 0106   PROTEINUR 30 (A) 03/08/2020 0106   UROBILINOGEN 1.0 10/20/2012 1340   NITRITE NEGATIVE 03/08/2020 0106   LEUKOCYTESUR NEGATIVE 03/08/2020 0106    Sepsis Labs: @LABRCNTIP (procalcitonin:4,lacticacidven:4)  ) Recent Results (from the past 240 hour(s))  Resp Panel by RT-PCR (Flu A&B, Covid) Nasopharyngeal Swab     Status: None   Collection Time: 03/08/20  1:14 AM   Specimen: Nasopharyngeal Swab; Nasopharyngeal(NP) swabs in vial transport medium  Result Value Ref Range Status   SARS Coronavirus 2 by RT PCR NEGATIVE NEGATIVE Final    Comment: (NOTE) SARS-CoV-2 target nucleic acids are NOT DETECTED.  The SARS-CoV-2 RNA is generally detectable in upper respiratory specimens during the acute  phase of infection. The lowest concentration of SARS-CoV-2 viral copies this assay can detect is 138 copies/mL. A negative result does not preclude SARS-Cov-2 infection and should not be used as the sole basis for treatment or other patient management decisions. A negative result may occur with  improper specimen collection/handling, submission of specimen other than nasopharyngeal swab, presence of viral mutation(s) within the areas targeted by this assay, and inadequate number of viral copies(<138 copies/mL). A negative result must be combined with clinical observations, patient history, and epidemiological information. The expected result is Negative.  Fact Sheet for Patients:  BloggerCourse.com  Fact Sheet for Healthcare Providers:  SeriousBroker.it  This test is no t yet approved or cleared by the [place] FDA and  has been authorized for detection and/or diagnosis of SARS-CoV-2 by FDA under an Emergency Use Authorization (EUA). This EUA will remain  in effect (meaning this test can be used) for the duration of the COVID-19 declaration under Section 564(b)(1) of the Act, 21 U.S.C.section 360bbb-3(b)(1), unless the authorization is terminated  or revoked sooner.       Influenza A by PCR NEGATIVE NEGATIVE Final   Influenza B by PCR NEGATIVE NEGATIVE Final    Comment:  (NOTE) The Xpert Xpress SARS-CoV-2/FLU/RSV plus assay is intended as an aid in the diagnosis of influenza from Nasopharyngeal swab specimens and should not be used as a sole basis for treatment. Nasal washings and aspirates are unacceptable for Xpert Xpress SARS-CoV-2/FLU/RSV testing.  Fact Sheet for Patients: BloggerCourse.com  Fact Sheet for Healthcare Providers: SeriousBroker.it  This test is not yet approved or cleared by the [place] FDA and has been authorized for detection and/or diagnosis of SARS-CoV-2 by FDA under an Emergency Use Authorization (EUA). This EUA will remain in effect (meaning this test can be used) for the duration of the COVID-19 declaration under Section 564(b)(1) of the Act, 21 U.S.C. section 360bbb-3(b)(1), unless the authorization is terminated or revoked.  Performed at [facility], [address] [place], [place] [address]   MRSA PCR Screening     Status: Abnormal   Collection Time: 03/08/20  5:19 PM   Specimen: Nasal Mucosa; Nasopharyngeal  Result Value Ref Range Status   MRSA by PCR POSITIVE (A) NEGATIVE Final    Comment:        The GeneXpert MRSA Assay (FDA approved for NASAL specimens only), is one component of a comprehensive MRSA colonization surveillance program. It is not intended to diagnose MRSA infection nor to guide or monitor treatment for MRSA infections. RESULT CALLED TO, READ BACK BY AND VERIFIED WITH: A COOPER RN [id number] 03/08/20 A BROWNING Performed at [facility], [address] [place], [place] [address]          Radiology Studies: No results found.      Scheduled Meds: . amiodarone  200 mg Oral Daily  . apixaban  10 mg Oral BID   Followed by  . [START ON 03/18/2020] apixaban  5 mg Oral BID  . atorvastatin  20 mg Oral Daily  . carvedilol  12.5 mg Oral BID WC  . Chlorhexidine Gluconate Cloth  6 each Topical Q0600  . furosemide  40 mg  Intravenous BID  . insulin aspart  0-15 Units Subcutaneous TID WC  . mometasone-formoterol  2 puff Inhalation BID  . mupirocin ointment  1 application Nasal BID  . spironolactone  25 mg Oral Daily   Continuous Infusions: . sodium chloride Stopped (03/11/20 1618)  LOS: 3 days    Time spent: 35 minutes    Alberteen Sam, MD Triad [name] 03/11/2020, 5:37 PM     Please page though AMION or Epic secure chat:  For [employer], Higher education careers adviser

## 2020-03-11 NOTE — TOC Benefit Eligibility Note (Signed)
Transition of Care [facility]) Benefit Eligibility Note    Patient Details  Name: Donald [name] MRN: [id number] Date of Birth: [birth year]   Medication/Dose: Everlene Balls  2.5 MG BID  Covered?: Yes  Tier:  (NO TIER)  Prescription Coverage [facility]: [facility]  and  [facility]  Spoke with Person/Company/Phone Number:: SAMANTHA  @  [place] MEDICAID  UHC/ [employer] RX # [phone]  Co-Pay: $ 3.00     Deductible:  (NO DEDUCTIBLE)  Additional Notes: APIXABAN : [name] Phone Number: 03/11/2020, 1:54 PM

## 2020-03-12 ENCOUNTER — Inpatient Hospital Stay (HOSPITAL_COMMUNITY): Payer: Medicaid Other

## 2020-03-12 ENCOUNTER — Inpatient Hospital Stay (HOSPITAL_COMMUNITY): Payer: Medicaid Other | Admitting: Certified Registered"

## 2020-03-12 ENCOUNTER — Encounter (HOSPITAL_COMMUNITY): Payer: Self-pay | Admitting: Family Medicine

## 2020-03-12 ENCOUNTER — Encounter (HOSPITAL_COMMUNITY)
Admission: EM | Disposition: A | Discharge status: Home / Self Care | Payer: Self-pay | Source: Home / Self Care | Attending: Internal Medicine

## 2020-03-12 ENCOUNTER — Other Ambulatory Visit: Payer: Self-pay | Admitting: Family Medicine

## 2020-03-12 DIAGNOSIS — I361 Nonrheumatic tricuspid (valve) insufficiency: Secondary | ICD-10-CM | POA: Diagnosis not present

## 2020-03-12 DIAGNOSIS — I4891 Unspecified atrial fibrillation: Secondary | ICD-10-CM | POA: Diagnosis not present

## 2020-03-12 DIAGNOSIS — I340 Nonrheumatic mitral (valve) insufficiency: Secondary | ICD-10-CM

## 2020-03-12 DIAGNOSIS — I5043 Acute on chronic combined systolic (congestive) and diastolic (congestive) heart failure: Secondary | ICD-10-CM | POA: Diagnosis not present

## 2020-03-12 HISTORY — PX: TEE WITHOUT CARDIOVERSION: SHX5443

## 2020-03-12 HISTORY — PX: CARDIOVERSION: SHX1299

## 2020-03-12 LAB — CBC
HCT: 34 % — ABNORMAL LOW (ref 39.0–52.0)
Hemoglobin: 10.4 g/dL — ABNORMAL LOW (ref 13.0–17.0)
MCH: 24.9 pg — ABNORMAL LOW (ref 26.0–34.0)
MCHC: 30.6 g/dL (ref 30.0–36.0)
MCV: 81.3 fL (ref 80.0–100.0)
Platelets: 357 10*3/uL (ref 150–400)
RBC: 4.18 MIL/uL — ABNORMAL LOW (ref 4.22–5.81)
RDW: 17.5 % — ABNORMAL HIGH (ref 11.5–15.5)
WBC: 8 10*3/uL (ref 4.0–10.5)
nRBC: 0 % (ref 0.0–0.2)

## 2020-03-12 LAB — BASIC METABOLIC PANEL
Anion gap: 9 (ref 5–15)
BUN: 9 mg/dL (ref 6–20)
CO2: 31 mmol/L (ref 22–32)
Calcium: 8.5 mg/dL — ABNORMAL LOW (ref 8.9–10.3)
Chloride: 97 mmol/L — ABNORMAL LOW (ref 98–111)
Creatinine, Ser: 0.93 mg/dL (ref 0.61–1.24)
GFR, Estimated: >60 mL/min (ref >60)
Glucose, Bld: 136 mg/dL — ABNORMAL HIGH (ref 70–99)
Potassium: 3.5 mmol/L (ref 3.5–5.1)
Sodium: 137 mmol/L (ref 135–145)

## 2020-03-12 LAB — PROTIME-INR
INR: 1.5 — ABNORMAL HIGH (ref 0.8–1.2)
Prothrombin Time: 17.1 seconds — ABNORMAL HIGH (ref 11.4–15.2)

## 2020-03-12 LAB — GLUCOSE, CAPILLARY
Glucose-Capillary: 105 mg/dL — ABNORMAL HIGH (ref 70–99)
Glucose-Capillary: 109 mg/dL — ABNORMAL HIGH (ref 70–99)
Glucose-Capillary: 139 mg/dL — ABNORMAL HIGH (ref 70–99)

## 2020-03-12 SURGERY — ECHOCARDIOGRAM, TRANSESOPHAGEAL
Anesthesia: Monitor Anesthesia Care

## 2020-03-12 MED ORDER — BUTAMBEN-TETRACAINE-BENZOCAINE 2-2-14 % EX AERO
INHALATION_SPRAY | CUTANEOUS | Status: DC | PRN
  Administered 2020-03-12: 2 via TOPICAL

## 2020-03-12 MED ORDER — SPIRONOLACTONE 25 MG PO TABS: 25.0000 mg | ORAL_TABLET | Freq: Two times a day (BID) | ORAL | 3 refills | Status: DC

## 2020-03-12 MED ORDER — PHENYLEPHRINE 40 MCG/ML (10ML) SYRINGE FOR IV PUSH (FOR BLOOD PRESSURE SUPPORT)
PREFILLED_SYRINGE | INTRAVENOUS | Status: DC | PRN
  Administered 2020-03-12: 120 ug via INTRAVENOUS

## 2020-03-12 MED ORDER — PROPOFOL 500 MG/50ML IV EMUL
INTRAVENOUS | Status: DC | PRN
  Administered 2020-03-12: 125 ug/kg/min via INTRAVENOUS

## 2020-03-12 MED ORDER — POTASSIUM CHLORIDE CRYS ER 20 MEQ PO TBCR
40.0000 meq | EXTENDED_RELEASE_TABLET | Freq: Once | ORAL | Status: AC
  Administered 2020-03-12: 40 meq via ORAL
  Filled 2020-03-12: qty 2

## 2020-03-12 MED ORDER — APIXABAN 5 MG PO TABS: ORAL_TABLET | ORAL | 0 refills | Status: DC

## 2020-03-12 MED ORDER — LACTATED RINGERS IV SOLN: INTRAVENOUS | Status: DC | PRN

## 2020-03-12 MED ORDER — FUROSEMIDE 40 MG PO TABS
40.0000 mg | ORAL_TABLET | Freq: Every day | ORAL | Status: DC
  Administered 2020-03-12: 40 mg via ORAL
  Filled 2020-03-12: qty 1

## 2020-03-12 MED ORDER — FUROSEMIDE 40 MG PO TABS: 40.0000 mg | ORAL_TABLET | Freq: Every day | ORAL | 3 refills | Status: DC

## 2020-03-12 MED ORDER — CARVEDILOL 12.5 MG PO TABS: 12.5000 mg | ORAL_TABLET | Freq: Two times a day (BID) | ORAL | 3 refills | Status: DC

## 2020-03-12 MED ORDER — AMIODARONE HCL 200 MG PO TABS: 200.0000 mg | ORAL_TABLET | Freq: Every day | ORAL | 3 refills | Status: DC

## 2020-03-12 MED ORDER — PROPOFOL 10 MG/ML IV BOLUS
INTRAVENOUS | Status: DC | PRN
  Administered 2020-03-12 (×2): 20 mg via INTRAVENOUS

## 2020-03-12 MED ORDER — APIXABAN 5 MG PO TABS: 5.0000 mg | ORAL_TABLET | Freq: Two times a day (BID) | ORAL | 2 refills | Status: DC

## 2020-03-12 MED FILL — ELIQUIS 5 MG TABLET: 5 | 28 days supply | Qty: 72 | Fill #0

## 2020-03-12 NOTE — Plan of Care (Signed)
  Problem: Education: Goal: Knowledge of General Education information will improve Description: Including pain rating scale, medication(s)/side effects and non-pharmacologic comfort measures Outcome: Progressing   Problem: Clinical Measurements: Goal: Will remain free from infection Outcome: Progressing   Problem: Activity: Goal: Risk for activity intolerance will decrease Outcome: Progressing   Problem: Nutrition: Goal: Adequate nutrition will be maintained Outcome: Progressing   Problem: Pain Managment: Goal: General experience of comfort will improve Outcome: Progressing   Problem: Safety: Goal: Ability to remain free from injury will improve Outcome: Progressing   

## 2020-03-12 NOTE — Progress Notes (Addendum)
Heart Failure Nurse Navigator Progress Note  PCP: Renaye Rakers, MD PCP-Cardiologist: Rudolpho Sevin, MD Admission Diagnosis: AF RVR Admitted from: home alone  Presentation:   [name] presented with [facility] found to be in AF RVR. Previous issues with medication adherence, states he is taking medicine as prescribed. DCCV in past for AF. 2/15 pt received DCCV x1 successful shock converted to NSR, planned DC this PM  ECHO/ LVEF: 25-30%, RV mod reduced.  Clinical Course:  Past Medical History:  Diagnosis Date  . Atrial fibrillation (HCC)   . COPD (chronic obstructive pulmonary disease) (HCC)   . Hypertension   . Sleep apnea   . Stroke [facility])      Social History   Socioeconomic History  . Marital status: Single    Spouse name: Not on file  . Number of children: Not on file  . Years of education: Not on file  . Highest education level: Not on file  Occupational History  . Not on file  Tobacco Use  . Smoking status: Former Smoker    Packs/day: 0.50    Quit date: 2018    Years since quitting: 4.1  . Smokeless tobacco: Never Used  Substance and Sexual Activity  . Alcohol use: Not Currently  . Drug use: Not Currently    Types: Cocaine  . Sexual activity: Not Currently  Other Topics Concern  . Not on file  Social History Narrative  . Not on file   Social Determinants of Health   Financial Resource Strain: Medium Risk  . Difficulty of Paying Living Expenses: Somewhat hard  Food Insecurity: No Food Insecurity  . Worried About Programme researcher, broadcasting/film/video in the Last Year: Never true  . Ran Out of Food in the Last Year: Never true  Transportation Needs: No Transportation Needs  . Lack of Transportation (Medical): No  . Lack of Transportation (Non-Medical): No  Physical Activity: Inactive  . Days of Exercise per Week: 0 days  . Minutes of Exercise per Session: 0 min  Stress: Not on file  Social Connections: Not on file   High Risk Criteria for Readmission and/or Poor Patient  Outcomes:  Heart failure hospital admissions (last 6 months): 1   No Show rate: 23%  Difficult social situation: yes  Demonstrates medication adherence: no  Primary Language: English  Literacy level: able to read/write and comprehend. States he is taking college courses in journalism and plans to make movies  Barriers of Care:   -pt lives alone, only support of neighbors occasionally.  Considerations/Referrals:   Referral made to Heart Failure Pharmacist Stewardship: yes, to see at [facility] TOC appt. Referral made to Heart & Vascular TOC clinic: yes, Monday 2/21 @ 3pm.  Items for Follow-up on DC/TOC: -medication compliance/education -medication optimization -medication cost  Ozella Rocks, RN, BSN Heart Failure Nurse Navigator [phone]

## 2020-03-12 NOTE — Progress Notes (Signed)
 Progress Note  Patient Name: Donald [name] Date of Encounter: 03/12/2020  Primary Cardiologist: No primary care provider on file.   Subjective   Feels well and ready for cardioversion  Inpatient Medications    Scheduled Meds: . amiodarone  200 mg Oral Daily  . apixaban  10 mg Oral BID   Followed by  . [START ON 03/18/2020] apixaban  5 mg Oral BID  . atorvastatin  20 mg Oral Daily  . carvedilol  12.5 mg Oral BID WC  . Chlorhexidine Gluconate Cloth  6 each Topical Q0600  . furosemide  40 mg Intravenous BID  . insulin aspart  0-15 Units Subcutaneous TID WC  . mometasone-formoterol  2 puff Inhalation BID  . mupirocin ointment  1 application Nasal BID  . spironolactone  25 mg Oral Daily   Continuous Infusions: . sodium chloride     PRN Meds: acetaminophen, albuterol, melatonin, ondansetron (ZOFRAN) IV   Vital Signs    Vitals:   03/11/20 2000 03/12/20 0025 03/12/20 0429 03/12/20 0813  BP: 116/78 111/82 (!) 118/93 (!) 129/101  Pulse: 100  (!) 108 98  Resp: (!) 27 (!) 23 17 19  Temp: 98.4 F (36.9 C) (!) 97.3 F (36.3 C) 98 F (36.7 C) 98.4 F (36.9 C)  TempSrc: Axillary Axillary Oral Oral  SpO2: 96% 98% 99% 95%  Weight:        Intake/Output Summary (Last 24 hours) at 03/12/2020 0858 Last data filed at 03/12/2020 0600 Gross per 24 hour  Intake --  Output 3900 ml  Net -3900 ml   Filed Weights   03/08/20 0349 03/11/20 0416  Weight: 122.5 kg 133.9 kg    Telemetry    afib rates 95 - Personally Reviewed  ECG    No new - Personally Reviewed  Physical Exam   GEN: No acute distress.   Neck: No JVD Cardiac: irregular rhythm, normal rate, no murmurs, rubs, or gallops.  Respiratory: Clear to auscultation bilaterally. GI: Soft, nontender, non-distended  MS: trace pedal edema; No deformity. Neuro:  Nonfocal  Psych: Normal affect   Labs    Chemistry Recent Labs  Lab 03/09/20 0152 03/10/20 0105 03/11/20 0018 03/12/20 0020  NA 132* 131* 135 137  K  4.2 3.7 3.8 3.5  CL 97* 96* 97* 97*  CO2 24 25 27 31  GLUCOSE 188* 133* 135* 136*  BUN 10 11 13 9  CREATININE 0.98 0.97 1.05 0.93  CALCIUM 8.3* 8.4* 8.2* 8.5*  ALBUMIN 2.5*  --   --   --   GFRNONAA >60 >60 >60 >60  ANIONGAP 11 10 11 9     Hematology Recent Labs  Lab 03/09/20 0152 03/10/20 0105 03/12/20 0020  WBC 8.5 7.6 8.0  RBC 4.11* 3.97* 4.18*  HGB 10.4* 10.2* 10.4*  HCT 32.4* 31.8* 34.0*  MCV 78.8* 80.1 81.3  MCH 25.3* 25.7* 24.9*  MCHC 32.1 32.1 30.6  RDW 17.6* 17.9* 17.5*  PLT 238 206 357    Cardiac EnzymesNo results for input(s): TROPONINI in the last 168 hours. No results for input(s): TROPIPOC in the last 168 hours.   BNP Recent Labs  Lab 03/08/20 0116  BNP 334.2*     DDimer No results for input(s): DDIMER in the last 168 hours.   Radiology    No results found.  Cardiac Studies  Echo  IMPRESSIONS     1. Left ventricular ejection fraction, by estimation, is 40 to 45%. The  left ventricle has mildly decreased function. The   left ventricle  demonstrates global hypokinesis. There is severe left ventricular  hypertrophy. Left ventricular diastolic function  could not be evaluated.   2. Right ventricular systolic function is mildly reduced. The right  ventricular size is normal. There is moderately elevated pulmonary artery  systolic pressure. The estimated right ventricular systolic pressure is  46.8 mmHg.   3. Left atrial size was severely dilated.   4. Right atrial size was severely dilated.   5. The pericardial effusion is localized near the right atrium.   6. The mitral valve is abnormal. Mild to moderate mitral valve  regurgitation. Moderate mitral annular calcification.   7. The aortic valve is tricuspid. Aortic valve regurgitation is trivial.  Aortic regurgitation PHT measures 449 msec. Aortic valve mean gradient  measures 4.7 mmHg.   8. Aortic dilatation noted. There is mild dilatation of the aortic root,  measuring 40 mm.   9. The  inferior vena cava is dilated in size with <50% respiratory  variability, suggesting right atrial pressure of 15 mmHg.    Patient Profile     60 y.o. male with a history of chronic systolic CHF with EF of 40-45%, persistent atrial fibrillation/paroxysmal atrial flutter s/p flutter ablation on Eliquis 5mg  twice daily, CVA in 2018 when non-compliant with anticoagulation, COPD, obstructive sleep apnea not on CPAP, hypertension, hyperlipidemia, morbid obesity, medication non-compliance, and former tobacco abuse (quit in 2019 or 2020) who is being seen for the evaluation of atrial fibrillation in the setting of PE.  Assessment & Plan   Principal Problem:   Atrial fibrillation with RVR (HCC) Active Problems:   Pulmonary embolism (HCC)   Essential hypertension   COPD (chronic obstructive pulmonary disease) (HCC)   Anemia   Atrial fib with RVR:  -rates around 95 currently. - No longer on cardizem. - amiodarone 200 mg daily - carvedilol 12.5 mg BID - Will use PE based dosing of eliquis 10 mg Bid for 7 days then 5 mg BID ongoing. Will receive 2 doses of eliquis 10 mg prior to cardioversion tomorrow. Based on pharmacokinetics of eliquis, two 10 mg doses should achieve adequate factor Xa inhibition suitable for cardioversion per 2021 pharmacist. - tentatively scheduled for TEE/DCCV 1:00 pm 03/12/20.  - will stop aspirin as there is no clear indication for this in combination with eliquis.    Severe biatrial enlargement: may impact cardioversion success but will attempt.    Acute on chronic combined systolic and diastolic HF: Cr: 03/14/20 >> 0.93 UOP yesterday: -3.3 L Weight: inaccurate weights. 133.9 kg yesterday Net negative for admission: -7.5 L Diuretic plan: will transition to lasix 40 mg po daily Heart Failure Therapy ACE-I/ARB/ARNI: consider adding at follow up if BP improved BB: carvedilol 12.5 mg BID MRA: spironolactone 25 mg daily SGLT2I: consider adding as outpatient  if EF remains low after restoration of SR.   LVH: echo report says severe but on my independent review this is likely overmeasured and may be more consistent with moderate LVH (15 mm wall thickness). May want to consider evaluation for cause of LVH particularly with reduced EF. Could consider outpatient cardiac MRI and protein electrophoresis. Follows with Dr. 1.61>>0.96>>0.45 in [facility]   Mild dilation of aorta: will need outpatient folllow up.    PE:  Acute this admission.  Now on Eliquis.   HTN:  BP is being managed in the context of managing the above.  BP normal at this time.    INFORMED CONSENT: After careful review of history and examination, the  risks and benefits of transesophageal echocardiogram have been explained including risks of esophageal damage, perforation (1:10,000 risk), bleeding, pharyngeal hematoma as well as other potential complications associated with conscious sedation including aspiration, arrhythmia, respiratory failure and death. Alternatives to treatment were discussed, questions were answered. Patient is willing to proceed. Risks, benefits and alternatives of direct current cardioversion reviewed including potential for post-cardioversion rhythms, especially life-threatening arrhythmias (ventricular tachycardia and fibrillation, profound bradycardia). Major complications may include serious or fatal arrhythmias, myocardial damage, and acute pulmonary edema; minor complications include skin burns and transient hypotension. Benefits include restoration of sinus rhythm. Alternatives to treatment were discussed, questions were answered. Patient is willing to proceed.      For questions or updates, please contact [facility] Please consult [email or web address] for contact info under        Signed, [place] Festa A Ellorie Kindall, MD  03/12/2020, 8:58 AM    

## 2020-03-12 NOTE — H&P (View-Only) (Signed)
Progress Note  Patient Name: Donald [name] Date of Encounter: 03/12/2020  Primary Cardiologist: No primary care provider on file.   Subjective   Feels well and ready for cardioversion  Inpatient Medications    Scheduled Meds: . amiodarone  200 mg Oral Daily  . apixaban  10 mg Oral BID   Followed by  . [START ON 03/18/2020] apixaban  5 mg Oral BID  . atorvastatin  20 mg Oral Daily  . carvedilol  12.5 mg Oral BID WC  . Chlorhexidine Gluconate Cloth  6 each Topical Q0600  . furosemide  40 mg Intravenous BID  . insulin aspart  0-15 Units Subcutaneous TID WC  . mometasone-formoterol  2 puff Inhalation BID  . mupirocin ointment  1 application Nasal BID  . spironolactone  25 mg Oral Daily   Continuous Infusions: . sodium chloride     PRN Meds: acetaminophen, albuterol, melatonin, ondansetron (ZOFRAN) IV   Vital Signs    Vitals:   03/11/20 2000 03/12/20 0025 03/12/20 0429 03/12/20 0813  BP: 116/78 111/82 (!) 118/93 (!) 129/101  Pulse: 100  (!) 108 98  Resp: (!) 27 (!) 23 17 19   Temp: 98.4 F (36.9 C) (!) 97.3 F (36.3 C) 98 F (36.7 C) 98.4 F (36.9 C)  TempSrc: Axillary Axillary Oral Oral  SpO2: 96% 98% 99% 95%  Weight:        Intake/Output Summary (Last 24 hours) at 03/12/2020 0858 Last data filed at 03/12/2020 0600 Gross per 24 hour  Intake --  Output 3900 ml  Net -3900 ml   Filed Weights   03/08/20 0349 03/11/20 0416  Weight: 122.5 kg 133.9 kg    Telemetry    afib rates 95 - Personally Reviewed  ECG    No new - Personally Reviewed  Physical Exam   GEN: No acute distress.   Neck: No JVD Cardiac: irregular rhythm, normal rate, no murmurs, rubs, or gallops.  Respiratory: Clear to auscultation bilaterally. GI: Soft, nontender, non-distended  MS: trace pedal edema; No deformity. Neuro:  Nonfocal  Psych: Normal affect   Labs    Chemistry Recent Labs  Lab 03/09/20 0152 03/10/20 0105 03/11/20 0018 03/12/20 0020  NA 132* 131* 135 137  K  4.2 3.7 3.8 3.5  CL 97* 96* 97* 97*  CO2 24 25 27 31   GLUCOSE 188* 133* 135* 136*  BUN 10 11 13 9   CREATININE 0.98 0.97 1.05 0.93  CALCIUM 8.3* 8.4* 8.2* 8.5*  ALBUMIN 2.5*  --   --   --   GFRNONAA >60 >60 >60 >60  ANIONGAP 11 10 11 9      Hematology Recent Labs  Lab 03/09/20 0152 03/10/20 0105 03/12/20 0020  WBC 8.5 7.6 8.0  RBC 4.11* 3.97* 4.18*  HGB 10.4* 10.2* 10.4*  HCT 32.4* 31.8* 34.0*  MCV 78.8* 80.1 81.3  MCH 25.3* 25.7* 24.9*  MCHC 32.1 32.1 30.6  RDW 17.6* 17.9* 17.5*  PLT 238 206 357    Cardiac EnzymesNo results for input(s): TROPONINI in the last 168 hours. No results for input(s): TROPIPOC in the last 168 hours.   BNP Recent Labs  Lab 03/08/20 0116  BNP 334.2*     DDimer No results for input(s): DDIMER in the last 168 hours.   Radiology    No results found.  Cardiac Studies  Echo  IMPRESSIONS     1. Left ventricular ejection fraction, by estimation, is 40 to 45%. The  left ventricle has mildly decreased function. The  left ventricle  demonstrates global hypokinesis. There is severe left ventricular  hypertrophy. Left ventricular diastolic function  could not be evaluated.   2. Right ventricular systolic function is mildly reduced. The right  ventricular size is normal. There is moderately elevated pulmonary artery  systolic pressure. The estimated right ventricular systolic pressure is  46.8 mmHg.   3. Left atrial size was severely dilated.   4. Right atrial size was severely dilated.   5. The pericardial effusion is localized near the right atrium.   6. The mitral valve is abnormal. Mild to moderate mitral valve  regurgitation. Moderate mitral annular calcification.   7. The aortic valve is tricuspid. Aortic valve regurgitation is trivial.  Aortic regurgitation PHT measures 449 msec. Aortic valve mean gradient  measures 4.7 mmHg.   8. Aortic dilatation noted. There is mild dilatation of the aortic root,  measuring 40 mm.   9. The  inferior vena cava is dilated in size with <50% respiratory  variability, suggesting right atrial pressure of 15 mmHg.    Patient Profile     60 y.o. male with a history of chronic systolic CHF with EF of 40-45%, persistent atrial fibrillation/paroxysmal atrial flutter s/p flutter ablation on Eliquis 5mg  twice daily, CVA in 2018 when non-compliant with anticoagulation, COPD, obstructive sleep apnea not on CPAP, hypertension, hyperlipidemia, morbid obesity, medication non-compliance, and former tobacco abuse (quit in 2019 or 2020) who is being seen for the evaluation of atrial fibrillation in the setting of PE.  Assessment & Plan   Principal Problem:   Atrial fibrillation with RVR (HCC) Active Problems:   Pulmonary embolism (HCC)   Essential hypertension   COPD (chronic obstructive pulmonary disease) (HCC)   Anemia   Atrial fib with RVR:  -rates around 95 currently. - No longer on cardizem. - amiodarone 200 mg daily - carvedilol 12.5 mg BID - Will use PE based dosing of eliquis 10 mg Bid for 7 days then 5 mg BID ongoing. Will receive 2 doses of eliquis 10 mg prior to cardioversion tomorrow. Based on pharmacokinetics of eliquis, two 10 mg doses should achieve adequate factor Xa inhibition suitable for cardioversion per 2021 pharmacist. - tentatively scheduled for TEE/DCCV 1:00 pm 03/12/20.  - will stop aspirin as there is no clear indication for this in combination with eliquis.    Severe biatrial enlargement: may impact cardioversion success but will attempt.    Acute on chronic combined systolic and diastolic HF: Cr: 03/14/20 >> 0.93 UOP yesterday: -3.3 L Weight: inaccurate weights. 133.9 kg yesterday Net negative for admission: -7.5 L Diuretic plan: will transition to lasix 40 mg po daily Heart Failure Therapy ACE-I/ARB/ARNI: consider adding at follow up if BP improved BB: carvedilol 12.5 mg BID MRA: spironolactone 25 mg daily SGLT2I: consider adding as outpatient  if EF remains low after restoration of SR.   LVH: echo report says severe but on my independent review this is likely overmeasured and may be more consistent with moderate LVH (15 mm wall thickness). May want to consider evaluation for cause of LVH particularly with reduced EF. Could consider outpatient cardiac MRI and protein electrophoresis. Follows with Dr. 1.61>>0.96>>0.45 in [facility]   Mild dilation of aorta: will need outpatient folllow up.    PE:  Acute this admission.  Now on Eliquis.   HTN:  BP is being managed in the context of managing the above.  BP normal at this time.    INFORMED CONSENT: After careful review of history and examination, the  risks and benefits of transesophageal echocardiogram have been explained including risks of esophageal damage, perforation (1:10,000 risk), bleeding, pharyngeal hematoma as well as other potential complications associated with conscious sedation including aspiration, arrhythmia, respiratory failure and death. Alternatives to treatment were discussed, questions were answered. Patient is willing to proceed. Risks, benefits and alternatives of direct current cardioversion reviewed including potential for post-cardioversion rhythms, especially life-threatening arrhythmias (ventricular tachycardia and fibrillation, profound bradycardia). Major complications may include serious or fatal arrhythmias, myocardial damage, and acute pulmonary edema; minor complications include skin burns and transient hypotension. Benefits include restoration of sinus rhythm. Alternatives to treatment were discussed, questions were answered. Patient is willing to proceed.      For questions or updates, please contact [facility] Please consult [email or web address] for contact info under        Signed, Parke Poisson, MD  03/12/2020, 8:58 AM

## 2020-03-12 NOTE — TOC Transition Note (Signed)
Transition of Care [facility]) - CM/SW Discharge Note   Patient Details  Name: Donald [name] MRN: [id number] Date of Birth: November 17, 1960  Transition of Care [facility]) CM/SW Contact:  Leone Haven, RN Phone Number: 03/12/2020, 4:31 PM   Clinical Narrative:    Patient is for dc today, NCM informed patient of copay amt for his eliquis  And that TOC will be filling this for him and the other meds went to [facility] on [address], he states ok.  He has no other needs.    Final next level of care: Home/Self Care Barriers to Discharge: No Barriers Identified   Patient Goals and CMS Choice        Discharge Placement                       Discharge Plan and Services                                     Social Determinants of Health (SDOH) Interventions     Readmission Risk Interventions No flowsheet data found.

## 2020-03-12 NOTE — Transfer of Care (Signed)
Immediate Anesthesia Transfer of Care Note  Patient: Donald [name]  Procedure(s) Performed: TRANSESOPHAGEAL ECHOCARDIOGRAM (TEE) (N/A ) CARDIOVERSION (N/A )  Patient Location: Endoscopy Unit  Anesthesia Type:MAC  Level of Consciousness: awake, alert  and oriented  Airway & Oxygen Therapy: Patient Spontanous Breathing and Patient connected to face mask oxygen  Post-op Assessment: Report given to RN, Post -op Vital signs reviewed and stable and Patient moving all extremities  Post vital signs: Reviewed and stable  Last Vitals:  Vitals Value Taken Time  BP 110/67 03/12/20 1404  Temp 36.7 C 03/12/20 1404  Pulse 75 03/12/20 1407  Resp 21 03/12/20 1407  SpO2 96 % 03/12/20 1407  Vitals shown include unvalidated device data.  Last Pain:  Vitals:   03/12/20 1404  TempSrc: Oral  PainSc: 0-No pain         Complications: No complications documented.

## 2020-03-12 NOTE — Discharge Summary (Signed)
Physician Discharge Summary  Donald [name] WUJ:[id number] DOB: [birth year] DOA: 03/07/2020  PCP: Renaye Rakers, MD  Admit date: 03/07/2020 Discharge date: 03/12/2020  Admitted From: Home  Disposition:  Home   Recommendations for Outpatient Follow-up:  1. Follow up with PCP Dr. Parke Simmers in 1-2 weeks 2. Follow up with Cardiology, Dr. Rudolpho Sevin in 1 week 3. Dr. Parke Simmers: Consider SGLT2i given sCHF 4. Dr. Rudolpho Sevin: Please check BMP in 1 week 5. Dr. Rudolpho Sevin: Please repeat echo post DCCV as indicated 6. Dr. Rudolpho Sevin: Attention on repeat echo to LVH, and if indicated, consider cardiac MRI and protein electrophoresis to evaluate other causes of LVH with reduced EF 7. Dr. Rudolpho Sevin: Attention on repeat echo to aortic dilatation of 40 mm    Home Health: None  Equipment/Devices: None new  Discharge Condition: Good  CODE STATUS: FULL Diet recommendation: Low sodium  Brief/Interim Summary: Donald [name] is a 60 y.o. M with persistent atrial fibrillation status post ablation on Eliquis, poor compliance, chronic systolic CHF, EF 95-62%, hx CVA 2018, COPD, OSA not on CPAP, HTN, and morbid obesity, who presented with shortness of breath.  In the ER he was found to have acute bilateral PE as well as A. fib with RVR.  Started on Cardizem drip and Lovenox.  She was consulted, partner was controlled with Cardizem, but the patient remains significantly symptomatic and DCCV was recommended.         PRINCIPAL HOSPITAL DIAGNOSIS: Acute pulmonary embolism    Discharge Diagnoses:   Acute pulmonary embolism The patient was admitted and started on Lovenox.  This appears to have been due to inconsistent use, not Eliquis failure.  His symptoms improved, and he was discharged on Eliquis, indefinitely.     Atrial fibrillation with RVR The patient's pulmonary embolism also instigated atrial fibrillation with RVR.  He was started on amiodarone and diltiazem, his heart rate was improved, but he remains  significantly symptomatic.  The patient underwent DCCV, on 2/15.  Post procedure, he was in sinus rhythm, and his symptoms were markedly improved.  He was able to ambulate without dyspnea and was discharged in stable condition.     Acute on chronic systolic and diastolic CHF He was diuresed over 7 kg fluid in 3 days.  His bilateral edema and pulmonary rales on exam resolved.    Regimen: Furosemide 40 mg daily Carvedilol 12.5 mg twice daily Spironolactone 25 mg daily Blood pressure not yet tolerating ARN I Consider SGLT2 if EF remains low after DCCV     Left ventricular hypertrophy Echocardiogram suggest severe LVH.  Defer outpatient cardiac MRI and protein electrophoresis to Dr. Zeb Comfort.   Diabetes A1c 7.4%  Anemia Hgb stable  Hypertension  OSA Not on CPAP  COPD No active flare  History of embolic CVA    Aortic aneurysm Echo noted dilated aorta 40 mm, not seen on CT chest.   -Attention at follow up         Discharge Instructions  Discharge Instructions    Diet - low sodium heart healthy   Complete by: As directed    Discharge instructions   Complete by: As directed    From Dr. Maryfrances Bunnell: You were admitted for shortness of breath. Here, we found this was from a blood clot in the lungs as well as your atrial fibrillation.  For the blood clot in the lungs, you were treated with blood thinners. When you have a blood clot, you have to use a "loading dose" of Eliquis. For the next 6  days, starting tonight, take apixaban/Eliquis 10 mg (2 tabs)  Take apixaban 10 mg (2 tabs) until next Monday --> starting next Monday night, switch to: Take apixaban 5 mg (1 tab) twice daily from then on  Do not miss doses of Eliquis  STOP taking low dose aspirin, you don't need this any more   Other medicine changes for your heart: Continue amiodarone 200 mg daily Stop digoxin and stop metoprolol  START the new medicine carvedilol 12.5 mg twice daily This is  similar to metoprolol, but this is a low dose If you have to, you may break it in half and discuss with Dr. Rudolpho Sevin  INCREASE your dose of furosemide (from 20 mg to 40 mg) daily  Weigh yourself today when you get home This is your DRY WEIGHT  Then, weigh yourself once every day If your weight is ever more than 5lbs OVER your dry weight, call Dr. Parke Simmers or Dr. Rudolpho Sevin for instructions (because this is probably fluid weight)  Stick to a low sodium diet  Go see Dr. Rudolpho Sevin as soon as you can Have him draw labs  It is probably also prudent to go see Dr. Parke Simmers in the next 2 weeks  Take it easy and ask Dr. Rudolpho Sevin about whether to start an exercise program.   Increase activity slowly   Complete by: As directed      Allergies as of 03/12/2020      Reactions   Lisinopril Cough      Medication List    STOP taking these medications   Aspirin Low Dose 81 MG EC tablet Generic drug: aspirin   digoxin 0.125 MG tablet Commonly known as: LANOXIN   metoprolol 200 MG 24 hr tablet Commonly known as: TOPROL-XL     TAKE these medications   albuterol (2.5 MG/3ML) 0.083% nebulizer solution Commonly known as: PROVENTIL Take 3 mLs (2.5 mg total) by nebulization every 4 (four) hours as needed for wheezing or shortness of breath.   albuterol 108 (90 Base) MCG/ACT inhaler Commonly known as: VENTOLIN HFA Inhale 2 puffs into the lungs every 4 (four) hours as needed for shortness of breath.   amiodarone 200 MG tablet Commonly known as: PACERONE Take 1 tablet (200 mg total) by mouth daily.   apixaban 5 MG Tabs tablet Commonly known as: ELIQUIS Take apixaban 10 mg (2 tabs) twice daily for 6 more days, then take apixaban 5 mg (1 tab) twice daily What changed:   how much to take  how to take this  when to take this  additional instructions   apixaban 5 MG Tabs tablet Commonly known as: ELIQUIS Take 1 tablet (5 mg total) by mouth 2 (two) times daily. Start taking on: March 18, 2020 What changed: You were already taking a medication with the same name, and this prescription was added. Make sure you understand how and when to take each.   atorvastatin 20 MG tablet Commonly known as: LIPITOR Take 20 mg by mouth daily.   budesonide-formoterol 80-4.5 MCG/ACT inhaler Commonly known as: SYMBICORT Inhale 2 puffs into the lungs in the morning and at bedtime.   carvedilol 12.5 MG tablet Commonly known as: COREG Take 1 tablet (12.5 mg total) by mouth 2 (two) times daily with a meal.   furosemide 40 MG tablet Commonly known as: LASIX Take 1 tablet (40 mg total) by mouth daily. Start taking on: March 13, 2020 What changed:   medication strength  how much to take   spironolactone 25  MG tablet Commonly known as: ALDACTONE Take 1 tablet (25 mg total) by mouth 2 (two) times daily.       Follow-up Information    Sandy Salaam, MD. Schedule an appointment as soon as possible for a visit.   Specialty: Cardiology Contact information: [address] [place] [address] [phone]        Renaye Rakers, MD. Schedule an appointment as soon as possible for a visit in 1 week(s).   Specialty: Family Medicine Contact information: [address] [place] [address] [phone]              Allergies  Allergen Reactions  . Lisinopril Cough    Consultations:  Cardiology   Procedures/Studies: DG Chest 2 View  Result Date: 03/07/2020 CLINICAL DATA:  Chest pain and shortness of breath. EXAM: CHEST - 2 VIEW COMPARISON:  Chest x-ray dated January 16, 2020. FINDINGS: Unchanged mild cardiomegaly. New pulmonary vascular congestion and diffuse interstitial thickening with small right pleural effusion. Mild right basilar atelectasis. No pneumothorax. No acute osseous abnormality. IMPRESSION: 1. New mild congestive heart failure. Electronically Signed   By: Obie Dredge M.D.   On: 03/07/2020 17:01   CT Angio Chest PE W and/or Wo  Contrast  Result Date: 03/08/2020 CLINICAL DATA:  Generalized weakness EXAM: CT ANGIOGRAPHY CHEST WITH CONTRAST TECHNIQUE: Multidetector CT imaging of the chest was performed using the standard protocol during bolus administration of intravenous contrast. Multiplanar CT image reconstructions and MIPs were obtained to evaluate the vascular anatomy. CONTRAST:  OMNIPAQUE IOHEXOL 350 MG/ML SOLN COMPARISON:  None. FINDINGS: Cardiovascular: There is a optimal opacification of the pulmonary arteries. There is partially occlusive thrombus seen in the posterior right lower lobe and right middle lobe segmental and subsegmental arterial branches. There is also partially occlusive thrombus in the left lower lobe subsegmental arterial branches. There is mild cardiomegaly. No pericardial effusion or thickening. No evidence right heart strain. There is normal three-vessel brachiocephalic anatomy without proximal stenosis. Scattered aortic atherosclerosis is noted. Mediastinum/Nodes: No hilar, mediastinal, or axillary adenopathy. Thyroid gland, trachea, and esophagus demonstrate no significant findings. Lungs/Pleura: A small right pleural effusion is present. There is patchy airspace opacity seen at the right lung base with ground-glass opacities. There is also a small amount of ground-glass opacity seen at the left lung base. Upper Abdomen: No acute abnormalities present in the visualized portions of the upper abdomen. Musculoskeletal: No chest wall abnormality. No acute or significant osseous findings. Review of the MIP images confirms the above findings. IMPRESSION: Partially occlusive thrombus within segmental and subsegmental arterial branches within the bilateral lungs as described above. No evidence of right ventricular heart strain. Small right pleural effusion Bilateral patchy ground-glass opacity seen within both lung bases right greater than left which could be due to atelectasis, or infectious etiology. Aortic  Atherosclerosis (ICD10-I70.0). These results were called by telephone at the time of interpretation on 03/08/2020 at 1:56 am to provider Geoffery Lyons , who verbally acknowledged these results. Electronically Signed   By: Jonna Clark M.D.   On: 03/08/2020 02:00   ECHOCARDIOGRAM COMPLETE  Result Date: 03/08/2020    ECHOCARDIOGRAM REPORT   Patient Name:   TAIVON HAROON Date of Exam: 03/08/2020 Medical Rec #:  [id number]       Height:       74.0 in Accession #:    [id number]      Weight:       270.0 lb Date of Birth:  12-25-1960        BSA:          2.469 m Patient Age:    60 years        BP:           113/87 mmHg Patient Gender: M               HR:           111 bpm. Exam Location:  Inpatient Procedure: 2D Echo, Cardiac Doppler and Color Doppler Indications:    Atrial fibrillation  History:        Patient has no prior history of Echocardiogram examinations.                 COPD, Signs/Symptoms:Shortness of Breath; Risk                 Factors:Hypertension. H/O CVA.  Sonographer:    Ross Ludwig RDCS (AE) Referring Phys: [id number] Claudean Severance CHOTINER IMPRESSIONS  1. Left ventricular ejection fraction, by estimation, is 40 to 45%. The left ventricle has mildly decreased function. The left ventricle demonstrates global hypokinesis. There is severe left ventricular hypertrophy. Left ventricular diastolic function could not be evaluated.  2. Right ventricular systolic function is mildly reduced. The right ventricular size is normal. There is moderately elevated pulmonary artery systolic pressure. The estimated right ventricular systolic pressure is 46.8 mmHg.  3. Left atrial size was severely dilated.  4. Right atrial size was severely dilated.  5. The pericardial effusion is localized near the right atrium.  6. The mitral valve is abnormal. Mild to moderate mitral valve regurgitation. Moderate mitral annular calcification.  7. The aortic valve is tricuspid. Aortic valve regurgitation is trivial. Aortic regurgitation PHT  measures 449 msec. Aortic valve mean gradient measures 4.7 mmHg.  8. Aortic dilatation noted. There is mild dilatation of the aortic root, measuring 40 mm.  9. The inferior vena cava is dilated in size with <50% respiratory variability, suggesting right atrial pressure of 15 mmHg. Comparison(s): No prior Echocardiogram. FINDINGS  Left Ventricle: Left ventricular ejection fraction, by estimation, is 40 to 45%. The left ventricle has mildly decreased function. The left ventricle demonstrates global hypokinesis. The left ventricular internal cavity size was normal in size. There is  severe left ventricular hypertrophy. Left ventricular diastolic function could not be evaluated due to atrial fibrillation. Left ventricular diastolic function could not be evaluated. Right Ventricle: The right ventricular size is normal. No increase in right ventricular wall thickness. Right ventricular systolic function is mildly reduced. There is moderately elevated pulmonary artery systolic pressure. The tricuspid regurgitant velocity is 2.82 m/s, and with an assumed right atrial pressure of 15 mmHg, the estimated right ventricular systolic pressure is 46.8 mmHg. Left Atrium: Left atrial size was severely dilated. Right Atrium: Right atrial size was severely dilated. Pericardium: Trivial pericardial effusion is present. The pericardial effusion is localized near the right atrium. Mitral Valve: The mitral valve is abnormal. There is mild thickening of the mitral valve leaflet(s). Moderate mitral annular calcification. Mild to moderate mitral valve regurgitation. Tricuspid Valve: The tricuspid valve is not well visualized. Tricuspid valve regurgitation is trivial. Aortic Valve: The aortic valve is tricuspid. Aortic valve regurgitation is trivial. Aortic regurgitation PHT measures 449 msec. Aortic valve mean gradient measures 4.7 mmHg. Aortic valve peak gradient measures 7.5 mmHg. Aortic valve area, by VTI measures  3.10 cm. Pulmonic  Valve: The pulmonic valve was grossly normal. Pulmonic valve regurgitation is not visualized. Aorta: Aortic dilatation  noted. There is mild dilatation of the aortic root, measuring 40 mm. Venous: The inferior vena cava is dilated in size with less than 50% respiratory variability, suggesting right atrial pressure of 15 mmHg. IAS/Shunts: No atrial level shunt detected by color flow Doppler.  LEFT VENTRICLE PLAX 2D LVIDd:         5.10 cm LVIDs:         4.40 cm LV PW:         1.70 cm LV IVS:        2.00 cm LVOT diam:     2.50 cm      3D Volume EF: LV SV:         68           3D EF:        40 % LV SV Index:   28           LV EDV:       223 ml LVOT Area:     4.91 cm     LV ESV:       135 ml                             LV SV:        89 ml  LV Volumes (MOD) LV vol d, MOD A2C: 171.0 ml LV vol d, MOD A4C: 176.0 ml LV vol s, MOD A2C: 88.9 ml LV vol s, MOD A4C: 104.0 ml LV SV MOD A2C:     82.1 ml LV SV MOD A4C:     176.0 ml LV SV MOD BP:      75.7 ml RIGHT VENTRICLE            IVC RV Basal diam:  4.60 cm    IVC diam: 2.50 cm RV Mid diam:    3.50 cm RV S prime:     9.03 cm/s TAPSE (M-mode): 1.2 cm LEFT ATRIUM              Index       RIGHT ATRIUM           Index LA diam:        5.30 cm  2.15 cm/m  RA Area:     32.20 cm LA Vol (A2C):   138.0 ml 55.89 ml/m RA Volume:   123.00 ml 49.82 ml/m LA Vol (A4C):   154.0 ml 62.37 ml/m LA Biplane Vol: 158.0 ml 63.99 ml/m  AORTIC VALVE AV Area (Vmax):    3.48 cm AV Area (Vmean):   3.19 cm AV Area (VTI):     3.10 cm AV Vmax:           137.00 cm/s AV Vmean:          104.367 cm/s AV VTI:            0.219 m AV Peak Grad:      7.5 mmHg AV Mean Grad:      4.7 mmHg LVOT Vmax:         97.07 cm/s LVOT Vmean:        67.867 cm/s LVOT VTI:          0.138 m LVOT/AV VTI ratio: 0.63 AI PHT:            449 msec  AORTA Ao Root diam: 3.50 cm Ao Asc diam:  4.00 cm MR Peak grad:    82.1 mmHg   TRICUSPID VALVE  MR Mean grad:    51.0 mmHg   TR Peak grad:   31.8 mmHg MR Vmax:         453.00 cm/s TR Vmax:         282.00 cm/s MR Vmean:        331.0 cm/s MR PISA:         2.26 cm    SHUNTS MR PISA Eff ROA: 19 mm      Systemic VTI:  0.14 m MR PISA Radius:  0.60 cm     Systemic Diam: 2.50 cm Zoila ShutterKenneth Hilty MD Electronically signed by Zoila ShutterKenneth Hilty MD Signature Date/Time: 03/08/2020/12:38:34 PM    Final    VAS US LOWER EXTREMITY VENOUS (DVT)  Result Date: 03/09/2020  Lower Venous DVT Study Indications: Swelling.  Risk Factors: None identified. Comparison Study: No previous Performing Technologist: Clint GuyLisa Gibson RVT  Examination Guidelines: A complete evaluation includes B-mode imaging, spectral Doppler, color Doppler, and power Doppler as needed of all accessible portions of each vessel. Bilateral testing is considered an integral part of a complete examination. Limited examinations for reoccurring indications may be performed as noted. The reflux portion of the exam is performed with the patient in reverse Trendelenburg.  +---------+---------------+---------+-----------+----------+--------------+ RIGHT    CompressibilityPhasicitySpontaneityPropertiesThrombus Aging +---------+---------------+---------+-----------+----------+--------------+ CFV      Full           Yes      Yes                                 +---------+---------------+---------+-----------+----------+--------------+ SFJ      Full                                                        +---------+---------------+---------+-----------+----------+--------------+ FV Prox  Full                                                        +---------+---------------+---------+-----------+----------+--------------+ FV Mid   Full                                                        +---------+---------------+---------+-----------+----------+--------------+ FV DistalFull                                                        +---------+---------------+---------+-----------+----------+--------------+ PFV      Full                                                         +---------+---------------+---------+-----------+----------+--------------+ POP      Full           Yes  Yes                                 +---------+---------------+---------+-----------+----------+--------------+ PTV      Full                                                        +---------+---------------+---------+-----------+----------+--------------+ PERO     Full                                                        +---------+---------------+---------+-----------+----------+--------------+ Soleal   Full                                                        +---------+---------------+---------+-----------+----------+--------------+ Gastroc  Full                                                        +---------+---------------+---------+-----------+----------+--------------+   +---------+---------------+---------+-----------+----------+--------------+ LEFT     CompressibilityPhasicitySpontaneityPropertiesThrombus Aging +---------+---------------+---------+-----------+----------+--------------+ CFV      Full           Yes      Yes                                 +---------+---------------+---------+-----------+----------+--------------+ SFJ      Full                                                        +---------+---------------+---------+-----------+----------+--------------+ FV Prox  Full                                                        +---------+---------------+---------+-----------+----------+--------------+ FV Mid   Full                                                        +---------+---------------+---------+-----------+----------+--------------+ FV DistalFull                                                        +---------+---------------+---------+-----------+----------+--------------+ PFV      Full                                                         +---------+---------------+---------+-----------+----------+--------------+  POP      Full           Yes      Yes                                 +---------+---------------+---------+-----------+----------+--------------+ PTV      Full                                                        +---------+---------------+---------+-----------+----------+--------------+ PERO     Full                                                        +---------+---------------+---------+-----------+----------+--------------+     Summary: BILATERAL: - No evidence of deep vein thrombosis seen in the lower extremities, bilaterally. -No evidence of popliteal cyst, bilaterally.   *See table(s) above for measurements and observations. Electronically signed by Coral Else MD on 03/09/2020 at 7:59:57 PM.    Final        Subjective: Feeling better post-procedure.  Dyspnea and fatigue are improved.  No fever, no respiratory symptoms.  Discharge Exam: Vitals:   03/12/20 1420 03/12/20 1445  BP: 121/83   Pulse: 75   Resp: 18   Temp:  (!) 97.3 F (36.3 C)  SpO2: 96%    Vitals:   03/12/20 1404 03/12/20 1410 03/12/20 1420 03/12/20 1445  BP: 110/67 112/76 121/83   Pulse: 74 73 75   Resp: 15 (!) 24 18   Temp: 98.1 F (36.7 C)   (!) 97.3 F (36.3 C)  TempSrc: Oral   Axillary  SpO2: 98% 94% 96%   Weight:      Height:        General: Pt is alert, awake, not in acute distress, sitting on edge of bed. Cardiovascular: RRR, nl S1-S2, no murmurs appreciated.   Trace LE edema.   Respiratory: Normal respiratory rate and rhythm.  CTAB without rales or wheezes. Abdominal: Abdomen soft and non-tender.  No distension or HSM.   Neuro/Psych: Strength symmetric in upper and lower extremities.  Judgment and insight appear normal.   The results of significant diagnostics from this hospitalization (including imaging, microbiology, ancillary and laboratory) are listed below for reference.      Microbiology: Recent Results (from the past 240 hour(s))  Resp Panel by RT-PCR (Flu A&B, Covid) Nasopharyngeal Swab     Status: None   Collection Time: 03/08/20  1:14 AM   Specimen: Nasopharyngeal Swab; Nasopharyngeal(NP) swabs in vial transport medium  Result Value Ref Range Status   SARS Coronavirus 2 by RT PCR NEGATIVE NEGATIVE Final    Comment: (NOTE) SARS-CoV-2 target nucleic acids are NOT DETECTED.  The SARS-CoV-2 RNA is generally detectable in upper respiratory specimens during the acute phase of infection. The lowest concentration of SARS-CoV-2 viral copies this assay can detect is 138 copies/mL. A negative result does not preclude SARS-Cov-2 infection and should not be used as the sole basis for treatment or other patient management decisions. A negative result may occur with  improper specimen collection/handling, submission of specimen other than nasopharyngeal swab, presence of viral mutation(s)  within the areas targeted by this assay, and inadequate number of viral copies(<138 copies/mL). A negative result must be combined with clinical observations, patient history, and epidemiological information. The expected result is Negative.  Fact Sheet for Patients:  BloggerCourse.com  Fact Sheet for Healthcare Providers:  SeriousBroker.it  This test is no t yet approved or cleared by the [place] FDA and  has been authorized for detection and/or diagnosis of SARS-CoV-2 by FDA under an Emergency Use Authorization (EUA). This EUA will remain  in effect (meaning this test can be used) for the duration of the COVID-19 declaration under Section 564(b)(1) of the Act, 21 U.S.C.section 360bbb-3(b)(1), unless the authorization is terminated  or revoked sooner.       Influenza A by PCR NEGATIVE NEGATIVE Final   Influenza B by PCR NEGATIVE NEGATIVE Final    Comment: (NOTE) The Xpert Xpress SARS-CoV-2/FLU/RSV plus assay is  intended as an aid in the diagnosis of influenza from Nasopharyngeal swab specimens and should not be used as a sole basis for treatment. Nasal washings and aspirates are unacceptable for Xpert Xpress SARS-CoV-2/FLU/RSV testing.  Fact Sheet for Patients: BloggerCourse.com  Fact Sheet for Healthcare Providers: SeriousBroker.it  This test is not yet approved or cleared by the [place] FDA and has been authorized for detection and/or diagnosis of SARS-CoV-2 by FDA under an Emergency Use Authorization (EUA). This EUA will remain in effect (meaning this test can be used) for the duration of the COVID-19 declaration under Section 564(b)(1) of the Act, 21 U.S.C. section 360bbb-3(b)(1), unless the authorization is terminated or revoked.  Performed at [facility], [address] [place], [place] [address]   MRSA PCR Screening     Status: Abnormal   Collection Time: 03/08/20  5:19 PM   Specimen: Nasal Mucosa; Nasopharyngeal  Result Value Ref Range Status   MRSA by PCR POSITIVE (A) NEGATIVE Final    Comment:        The GeneXpert MRSA Assay (FDA approved for NASAL specimens only), is one component of a comprehensive MRSA colonization surveillance program. It is not intended to diagnose MRSA infection nor to guide or monitor treatment for MRSA infections. RESULT CALLED TO, READ BACK BY AND VERIFIED WITH: A COOPER RN 2135 03/08/20 A BROWNING Performed at [facility], [address], [place], [place] [address]      Labs: BNP (last 3 results) Recent Labs    03/08/20 0116  BNP 334.2*   Basic Metabolic Panel: Recent Labs  Lab 03/08/20 0506 03/08/20 0858 03/09/20 0152 03/10/20 0105 03/11/20 0018 03/12/20 0020  NA 133*  --  132* 131* 135 137  K 4.2  --  4.2 3.7 3.8 3.5  CL 97*  --  97* 96* 97* 97*  CO2 25  --  24 25 27 31   GLUCOSE 146*  --  188* 133* 135* 136*  BUN 7  --  10 11 13 9   CREATININE 0.91  --   0.98 0.97 1.05 0.93  CALCIUM 8.4*  --  8.3* 8.4* 8.2* 8.5*  MG  --  1.8 1.9  --   --   --   PHOS  --   --  3.9  --   --   --    Liver Function Tests: Recent Labs  Lab 03/09/20 0152  ALBUMIN 2.5*   No results for input(s): LIPASE, AMYLASE in the last 168 hours. No results for input(s): AMMONIA in the last 168 hours. CBC: Recent Labs  Lab  03/07/20 1642 03/08/20 0506 03/09/20 0152 03/10/20 0105 03/12/20 0020  WBC 9.8 9.3 8.5 7.6 8.0  HGB 11.7* 11.2* 10.4* 10.2* 10.4*  HCT 36.8* 34.9* 32.4* 31.8* 34.0*  MCV 81.4 80.4 78.8* 80.1 81.3  PLT 241 246 238 206 357   Cardiac Enzymes: No results for input(s): CKTOTAL, CKMB, CKMBINDEX, TROPONINI in the last 168 hours. BNP: Invalid input(s): POCBNP CBG: Recent Labs  Lab 03/11/20 1648 03/11/20 2125 03/12/20 0611 03/12/20 1142  GLUCAP 127* 185* 105* 109*   D-Dimer No results for input(s): DDIMER in the last 72 hours. Hgb A1c Recent Labs    03/10/20 0105  HGBA1C 7.4*   Lipid Profile No results for input(s): CHOL, HDL, LDLCALC, TRIG, CHOLHDL, LDLDIRECT in the last 72 hours. Thyroid function studies No results for input(s): TSH, T4TOTAL, T3FREE, THYROIDAB in the last 72 hours.  Invalid input(s): FREET3 Anemia work up No results for input(s): VITAMINB12, FOLATE, FERRITIN, TIBC, IRON, RETICCTPCT in the last 72 hours. Urinalysis    Component Value Date/Time   COLORURINE AMBER (A) 03/08/2020 0106   APPEARANCEUR CLEAR 03/08/2020 0106   LABSPEC 1.019 03/08/2020 0106   PHURINE 6.0 03/08/2020 0106   GLUCOSEU NEGATIVE 03/08/2020 0106   HGBUR NEGATIVE 03/08/2020 0106   BILIRUBINUR SMALL (A) 03/08/2020 0106   KETONESUR NEGATIVE 03/08/2020 0106   PROTEINUR 30 (A) 03/08/2020 0106   UROBILINOGEN 1.0 10/20/2012 1340   NITRITE NEGATIVE 03/08/2020 0106   LEUKOCYTESUR NEGATIVE 03/08/2020 0106   Sepsis Labs Invalid input(s): PROCALCITONIN,  WBC,  LACTICIDVEN Microbiology Recent Results (from the past 240 hour(s))  Resp Panel by  RT-PCR (Flu A&B, Covid) Nasopharyngeal Swab     Status: None   Collection Time: 03/08/20  1:14 AM   Specimen: Nasopharyngeal Swab; Nasopharyngeal(NP) swabs in vial transport medium  Result Value Ref Range Status   SARS Coronavirus 2 by RT PCR NEGATIVE NEGATIVE Final    Comment: (NOTE) SARS-CoV-2 target nucleic acids are NOT DETECTED.  The SARS-CoV-2 RNA is generally detectable in upper respiratory specimens during the acute phase of infection. The lowest concentration of SARS-CoV-2 viral copies this assay can detect is 138 copies/mL. A negative result does not preclude SARS-Cov-2 infection and should not be used as the sole basis for treatment or other patient management decisions. A negative result may occur with  improper specimen collection/handling, submission of specimen other than nasopharyngeal swab, presence of viral mutation(s) within the areas targeted by this assay, and inadequate number of viral copies(<138 copies/mL). A negative result must be combined with clinical observations, patient history, and epidemiological information. The expected result is Negative.  Fact Sheet for Patients:  BloggerCourse.com  Fact Sheet for Healthcare Providers:  SeriousBroker.it  This test is no t yet approved or cleared by the [place] FDA and  has been authorized for detection and/or diagnosis of SARS-CoV-2 by FDA under an Emergency Use Authorization (EUA). This EUA will remain  in effect (meaning this test can be used) for the duration of the COVID-19 declaration under Section 564(b)(1) of the Act, 21 U.S.C.section 360bbb-3(b)(1), unless the authorization is terminated  or revoked sooner.       Influenza A by PCR NEGATIVE NEGATIVE Final   Influenza B by PCR NEGATIVE NEGATIVE Final    Comment: (NOTE) The Xpert Xpress SARS-CoV-2/FLU/RSV plus assay is intended as an aid in the diagnosis of influenza from Nasopharyngeal swab  specimens and should not be used as a sole basis for treatment. Nasal washings and aspirates are unacceptable for Xpert Xpress SARS-CoV-2/FLU/RSV testing.  Fact Sheet for Patients: BloggerCourse.com  Fact Sheet for Healthcare Providers: SeriousBroker.it  This test is not yet approved or cleared by the [place] FDA and has been authorized for detection and/or diagnosis of SARS-CoV-2 by FDA under an Emergency Use Authorization (EUA). This EUA will remain in effect (meaning this test can be used) for the duration of the COVID-19 declaration under Section 564(b)(1) of the Act, 21 U.S.C. section 360bbb-3(b)(1), unless the authorization is terminated or revoked.  Performed at [facility], [address], [place], [place] [address]   MRSA PCR Screening     Status: Abnormal   Collection Time: 03/08/20  5:19 PM   Specimen: Nasal Mucosa; Nasopharyngeal  Result Value Ref Range Status   MRSA by PCR POSITIVE (A) NEGATIVE Final    Comment:        The GeneXpert MRSA Assay (FDA approved for NASAL specimens only), is one component of a comprehensive MRSA colonization surveillance program. It is not intended to diagnose MRSA infection nor to guide or monitor treatment for MRSA infections. RESULT CALLED TO, READ BACK BY AND VERIFIED WITH: A COOPER RN 2135 03/08/20 A BROWNING Performed at [facility], [address], [place], [place] [address]      Time coordinating discharge: 35 minutes        SIGNED:   Alberteen Sam, MD  [facility] 03/12/2020, 3:54 PM

## 2020-03-12 NOTE — Interval H&P Note (Signed)
History and Physical Interval Note:  03/12/2020 1:14 PM  Donald [name]  has presented today for surgery, with the diagnosis of FIB.  The various methods of treatment have been discussed with the patient and family. After consideration of risks, benefits and other options for treatment, the patient has consented to  Procedure(s): TRANSESOPHAGEAL ECHOCARDIOGRAM (TEE) (N/A) CARDIOVERSION (N/A) as a surgical intervention.  The patient's history has been reviewed, patient examined, no change in status, stable for surgery.  I have reviewed the patient's chart and labs.  Questions were answered to the patient's satisfaction.     Chilton Si, MD

## 2020-03-12 NOTE — CV Procedure (Addendum)
Brief TEE Note  No LA/LAA thrombus or mass. Sluggish flow with smoke noted in the left atrium. LVEF 25-30% with global hypokinesis. Transgastric images were not obtained due to patient hypoxia.  For additional details see full report.   Electrical Cardioversion Procedure Note Donald [name] [id number] [birth year]  Procedure: Electrical Cardioversion Indications:  Atrial Fibrillation  Procedure Details Consent: Risks of procedure as well as the alternatives and risks of each were explained to the (patient/caregiver).  Consent for procedure obtained. Time Out: Verified patient identification, verified procedure, site/side was marked, verified correct patient position, special equipment/implants available, medications/allergies/relevent history reviewed, required imaging and test results available.  Performed  Patient placed on cardiac monitor, pulse oximetry, supplemental oxygen as necessary.  Sedation given: propofol Pacer pads placed anterior and posterior chest.  Cardioverted 1 time(s).  Cardioverted at 200J.  Evaluation Findings: Post procedure EKG shows: NSR Complications: None Patient did tolerate procedure well.   Chilton Si, MD 03/12/2020, 1:48 PM

## 2020-03-12 NOTE — Anesthesia Preprocedure Evaluation (Addendum)
Anesthesia Evaluation  Patient identified by MRN, date of birth, ID band Patient awake    Reviewed: Allergy & Precautions, H&P , NPO status , Patient's Chart, lab work & pertinent test results  Airway Mallampati: II   Neck ROM: full    Dental   Pulmonary COPD, Current Smoker, former smoker,    breath sounds clear to auscultation       Cardiovascular hypertension, + dysrhythmias Atrial Fibrillation  Rhythm:irregular Rate:Normal  12-Mar-2020 06:56:46 [facility]26 ROUTINE RECORD Atrial fibrillation with rapid ventricular response ST & T wave abnormality, consider lateral ischemia Abnormal ECG   Neuro/Psych CVA    GI/Hepatic negative GI ROS, Neg liver ROS,   Endo/Other  negative endocrine ROS  Renal/GU negative Renal ROS  negative genitourinary   Musculoskeletal negative musculoskeletal ROS (+)   Abdominal   Peds negative pediatric ROS (+)  Hematology  (+) Blood dyscrasia, anemia ,   Anesthesia Other Findings   Reproductive/Obstetrics negative OB ROS                            Anesthesia Physical Anesthesia Plan  ASA: III  Anesthesia Plan: MAC   Post-op Pain Management:    Induction: Intravenous  PONV Risk Score and Plan: 1 and Propofol infusion, TIVA and Treatment may vary due to age or medical condition  Airway Management Planned: Natural Airway and Simple Face Mask  Additional Equipment: None  Intra-op Plan:   Post-operative Plan:   Informed Consent: I have reviewed the patients History and Physical, chart, labs and discussed the procedure including the risks, benefits and alternatives for the proposed anesthesia with the patient or authorized representative who has indicated his/her understanding and acceptance.     Dental advisory given  Plan Discussed with: CRNA and Anesthesiologist  Anesthesia Plan Comments:        Anesthesia Quick  Evaluation

## 2020-03-12 NOTE — Progress Notes (Signed)
*  PRELIMINARY RESULTS* Echocardiogram TEE has been performed.  Pieter Partridge 03/12/2020, 3:09 PM

## 2020-03-12 NOTE — Anesthesia Procedure Notes (Signed)
Procedure Name: MAC Date/Time: 03/12/2020 1:20 PM Performed by: Amadeo Garnet, CRNA Pre-anesthesia Checklist: Patient identified, Emergency Drugs available, Patient being monitored and Suction available Patient Re-evaluated:Patient Re-evaluated prior to induction Oxygen Delivery Method: Nasal cannula Preoxygenation: Pre-oxygenation with 100% oxygen Induction Type: IV induction Placement Confirmation: positive ETCO2 Dental Injury: Teeth and Oropharynx as per pre-operative assessment

## 2020-03-13 NOTE — Anesthesia Postprocedure Evaluation (Signed)
Anesthesia Post Note  Patient: DWYNE HASEGAWA  Procedure(s) Performed: TRANSESOPHAGEAL ECHOCARDIOGRAM (TEE) (N/A ) CARDIOVERSION (N/A )     Patient location during evaluation: Endoscopy Anesthesia Type: MAC Level of consciousness: awake and alert Pain management: pain level controlled Vital Signs Assessment: post-procedure vital signs reviewed and stable Respiratory status: spontaneous breathing, nonlabored ventilation, respiratory function stable and patient connected to nasal cannula oxygen Cardiovascular status: stable and blood pressure returned to baseline Postop Assessment: no apparent nausea or vomiting Anesthetic complications: no   No complications documented.  Last Vitals:  Vitals:   03/12/20 1420 03/12/20 1445  BP: 121/83   Pulse: 75   Resp: 18   Temp:  (!) 36.3 C  SpO2: 96%     Last Pain:  Vitals:   03/12/20 1445  TempSrc: Axillary  PainSc: 0-No pain                 Dianara Smullen S

## 2020-03-14 ENCOUNTER — Encounter (HOSPITAL_COMMUNITY): Payer: Self-pay | Admitting: Cardiovascular Disease

## 2020-03-15 ENCOUNTER — Telehealth (HOSPITAL_COMMUNITY): Payer: Self-pay

## 2020-03-15 NOTE — Telephone Encounter (Signed)
Pharmacy Transitions of Care Follow-up Telephone Call  Date of discharge:  03/12/20 Discharge Diagnosis: Afib  How have you been since you were released from the hospital? Patient has been well, feeling much better after discharge. Knows s/sx of bleeding to watch out for  Medication changes made at discharge: yes  Medication changes obtained and verified? yes    Medication Accessibility:  . Home Pharmacy: patient has been on Eliquis for months so already has refills at home pharmacy. No refills in [facility] pharmacy at this time    Medication Review:  APIXABEN (ELIQUIS)  Apixaban 10mg  BID on 03/12/20 and switch to 5mg  BID on 03/18/20. - Discussed importance of taking medication around the same time everyday  - Advised patient of medications to avoid (NSAIDs, ASA)  - Educated that Tylenol (acetaminophen) will be the preferred analgesic to prevent risk of bleeding  - Emphasized importance of monitoring for signs and symptoms of bleeding (abnormal bruising, prolonged bleeding, nose bleeds, bleeding from gums, discolored urine, black tarry stools)  - Advised patient to alert all providers of anticoagulation therapy prior to starting a new medication or having a procedure    Follow-up Appointments:  Has F/U with Dr. (PCP) in 1 week but patient couldn't think of exact date. Follow up with Dr.Akbar in Cardiology on 03/18/20 If their condition worsens, is the pt aware to call PCP or go to the Emergency Dept.? yes  Final Patient Assessment: Patient is doing well. Has f/u scheduled and refills at home pharmacy.

## 2020-03-18 ENCOUNTER — Other Ambulatory Visit: Payer: Self-pay | Admitting: Adult Health

## 2020-03-18 ENCOUNTER — Other Ambulatory Visit: Payer: Self-pay

## 2020-03-18 ENCOUNTER — Encounter (HOSPITAL_COMMUNITY): Payer: Self-pay

## 2020-03-18 ENCOUNTER — Ambulatory Visit (HOSPITAL_COMMUNITY)
Admit: 2020-03-18 | Discharge: 2020-03-18 | Disposition: A | Discharge status: Home / Self Care | Payer: Medicaid Other | Attending: Adult Health | Admitting: Adult Health

## 2020-03-18 ENCOUNTER — Telehealth (HOSPITAL_COMMUNITY): Payer: Self-pay

## 2020-03-18 VITALS — BP 116/78 | HR 68 | Wt 277.1 lb

## 2020-03-18 DIAGNOSIS — Z596 Low income: Secondary | ICD-10-CM | POA: Diagnosis not present

## 2020-03-18 DIAGNOSIS — Z7951 Long term (current) use of inhaled steroids: Secondary | ICD-10-CM | POA: Insufficient documentation

## 2020-03-18 DIAGNOSIS — Z7901 Long term (current) use of anticoagulants: Secondary | ICD-10-CM | POA: Insufficient documentation

## 2020-03-18 DIAGNOSIS — E669 Obesity, unspecified: Secondary | ICD-10-CM | POA: Insufficient documentation

## 2020-03-18 DIAGNOSIS — I5022 Chronic systolic (congestive) heart failure: Secondary | ICD-10-CM | POA: Diagnosis not present

## 2020-03-18 DIAGNOSIS — Z6838 Body mass index (BMI) 38.0-38.9, adult: Secondary | ICD-10-CM | POA: Insufficient documentation

## 2020-03-18 DIAGNOSIS — Z8249 Family history of ischemic heart disease and other diseases of the circulatory system: Secondary | ICD-10-CM | POA: Insufficient documentation

## 2020-03-18 DIAGNOSIS — G4733 Obstructive sleep apnea (adult) (pediatric): Secondary | ICD-10-CM | POA: Diagnosis not present

## 2020-03-18 DIAGNOSIS — I110 Hypertensive heart disease with heart failure: Secondary | ICD-10-CM | POA: Diagnosis present

## 2020-03-18 DIAGNOSIS — I480 Paroxysmal atrial fibrillation: Secondary | ICD-10-CM | POA: Insufficient documentation

## 2020-03-18 DIAGNOSIS — J449 Chronic obstructive pulmonary disease, unspecified: Secondary | ICD-10-CM | POA: Diagnosis not present

## 2020-03-18 DIAGNOSIS — Z8673 Personal history of transient ischemic attack (TIA), and cerebral infarction without residual deficits: Secondary | ICD-10-CM | POA: Insufficient documentation

## 2020-03-18 DIAGNOSIS — Z87891 Personal history of nicotine dependence: Secondary | ICD-10-CM | POA: Insufficient documentation

## 2020-03-18 DIAGNOSIS — Z5989 Other problems related to housing and economic circumstances: Secondary | ICD-10-CM | POA: Insufficient documentation

## 2020-03-18 DIAGNOSIS — Z79899 Other long term (current) drug therapy: Secondary | ICD-10-CM | POA: Diagnosis not present

## 2020-03-18 DIAGNOSIS — Z888 Allergy status to other drugs, medicaments and biological substances status: Secondary | ICD-10-CM | POA: Diagnosis not present

## 2020-03-18 HISTORY — DX: Heart failure, unspecified: I50.9

## 2020-03-18 HISTORY — DX: Cardiac arrhythmia, unspecified: I49.9

## 2020-03-18 LAB — BASIC METABOLIC PANEL
Anion gap: 12 (ref 5–15)
BUN: 12 mg/dL (ref 6–20)
CO2: 27 mmol/L (ref 22–32)
Calcium: 9.4 mg/dL (ref 8.9–10.3)
Chloride: 98 mmol/L (ref 98–111)
Creatinine, Ser: 1.02 mg/dL (ref 0.61–1.24)
GFR, Estimated: >60 mL/min (ref >60)
Glucose, Bld: 100 mg/dL — ABNORMAL HIGH (ref 70–99)
Potassium: 4.1 mmol/L (ref 3.5–5.1)
Sodium: 137 mmol/L (ref 135–145)

## 2020-03-18 LAB — BRAIN NATRIURETIC PEPTIDE: B Natriuretic Peptide: 254.6 pg/mL — ABNORMAL HIGH (ref 0.0–100.0)

## 2020-03-18 MED ORDER — FUROSEMIDE 40 MG PO TABS: 20.0000 mg | ORAL_TABLET | Freq: Every day | ORAL | 3 refills | Status: DC

## 2020-03-18 MED ORDER — ENTRESTO 24-26 MG PO TABS: 1.0000 | ORAL_TABLET | Freq: Two times a day (BID) | ORAL | 6 refills | Status: DC

## 2020-03-18 MED ORDER — SPIRONOLACTONE 25 MG PO TABS
25.0000 mg | ORAL_TABLET | Freq: Every day | ORAL | 3 refills | Status: DC
Start: 2020-03-18 — End: 2020-08-15

## 2020-03-18 NOTE — Patient Instructions (Signed)
Stop Aspirin  Stop Losartan  Decrease Furosemide to 20 mg (1/2 tab) Daily  Decrease Spironolactone to 25 mg Daily  Start Entresto 24/26 mg Twice daily   Labs done today, your results will be available in MyChart, we will contact you for abnormal readings.  Thank you for allowing [place] to provider your heart failure care after your recent hospitalization. Please follow-up with [place] in 2 weeks  If you have any questions, issues, or concerns before your next appointment please call our office at [phone], opt. [phone] and leave a message for the triage nurse.

## 2020-03-18 NOTE — Telephone Encounter (Signed)
Heart Failure Patient Advocate Encounter   Received notification from [employer] that prior authorization for Sherryll Burger is required.   PA submitted on CoverMyMeds Key [id number] Status is pending  Patient was provided with free 30-day card to use to get first fill of Entresto while the prior authorization is pending.   Will continue to follow.  Sharen Hones, PharmD, BCPS Heart Failure Stewardship Pharmacist Phone [phone]

## 2020-03-18 NOTE — Progress Notes (Signed)
HEART &TRANSITION OF CARE CONSULT NOTE     Referring Physician: Dr Maryfrances Bunnell Primary Care: Dr Parke Simmers  Primary Cardiologist: Dr Rudolpho Sevin   HPI: Donald [name] is a 60 year old referred by Dr Maryfrances Bunnell for heart failure consultation.   Donald [name] is a 60 year old with a history of a fib, A fib ablation, CVA 2018, COPD, OSA, HTN, obesity, and chronic systolic heart failure.  Looks like he has been taking his medications intermittently.    Admitted 12/2019 at [facility] with A/C systolic heart failure and A fib RVR. Diuresed and transitioned back to pos lasix.  There was concern about medication compliance.    Followed by Dr Rudolpho Sevin for heart failure and was last seen 01/30/20. At that time he was running out of his medications. HF meds refilled. He had also planned to obtain cardioversion.   Admitted 03/07/20 with increased shortness of breath. CTA concerning for acute bilateral PE and A fib RVR. Had successful cardioversion on 03/12/20. Discharged on lasix 40 mg daily, carvedilol 12.5 mg twice a day, and spironolactone 25 mg daily.   Overall feeling fine. SOB if walks briskly. Denies PND/Orthopnea. No bleeding issues.  Appetite ok. No fever or chills. Weight at home 295-->275   pounds. Taking all medications. Has all medications in a pill box.  Rides a scooter to appointments. He does not have a license due to DUI x2. Uses food stamps. Lives alone. He is disabled.  Cardiac Testing  DC-CV 03/13/2019--->NSR   ECHO 03/08/2020 EF 40-45% RV mildly reduced, RA/LA severely dilated, mild-mod MV regurgitatio  CTA 03/08/2020  Partially occlusive thrombus within segmental and subsegmental arterial branches within the bilateral lungs as described above. No evidence of right ventricular heart strain.Small right pleural effusion. Bilateral patchy ground-glass opacity seen within both lung bases right greater than left which could be due to atelectasis, or infectious etiology.  Nuclear Stress Test 2018 1.  No reversible ischemia or infarction.  2. Mild generalized hypokinesis. No focal left ventricular wall  motion defect beyond mild generalized hypokinesis.  3. Left ventricular ejection fraction 43%     Review of Systems: [y] = yes, [ ]  = no   . General: Weight gain [ ] ; Weight loss [Y ]; Anorexia [ ] ; Fatigue [Y ]; Fever [ ] ; Chills [ ] ; Weakness [ ]   . Cardiac: Chest pain/pressure [ ] ; Resting SOB [ ] ; Exertional SOB [ Y]; Orthopnea [ ] ; Pedal Edema [ ] ; Palpitations [ ] ; Syncope [ ] ; Presyncope [ ] ; Paroxysmal nocturnal dyspnea[ ]   . Pulmonary: Cough [ ] ; Wheezing[ ] ; Hemoptysis[ ] ; Sputum [ ] ; Snoring [ ]   . GI: Vomiting[ ] ; Dysphagia[ ] ; Melena[ ] ; Hematochezia [ ] ; Heartburn[ ] ; Abdominal pain [ ] ; Constipation [ ] ; Diarrhea [ ] ; BRBPR [ ]   . GU: Hematuria[ ] ; Dysuria [ ] ; Nocturia[ ]   . Vascular: Pain in legs with walking [ ] ; Pain in feet with lying flat [ ] ; Non-healing sores [ ] ; Stroke [Y ]; TIA [ ] ; Slurred speech [ ] ;  . Neuro: Headaches[ ] ; Vertigo[ ] ; Seizures[ ] ; Paresthesias[ ] ;Blurred vision [ ] ; Diplopia [ ] ; Vision changes [ ]   . Ortho/Skin: Arthritis [ ] ; Joint pain [ ] ; Muscle pain [ ] ; Joint swelling [ ] ; Back Pain [ Y]; Rash [ ]   . Psych: Depression[ ] ; Anxiety[ ]   . Heme: Bleeding problems [ ] ; Clotting disorders [ ] ; Anemia [ ]   . Endocrine: Diabetes [Y ]; Thyroid dysfunction[ ]   Past Medical History:  Diagnosis Date  . Atrial fibrillation (HCC)   . COPD (chronic obstructive pulmonary disease) (HCC)   . Hypertension   . Sleep apnea   . Stroke [facility])     Current Outpatient Medications  Medication Sig Dispense Refill  . albuterol (PROVENTIL HFA;VENTOLIN HFA) 108 (90 Base) MCG/ACT inhaler Inhale 2 puffs into the lungs every 4 (four) hours as needed for shortness of breath.    Marland Kitchen albuterol (PROVENTIL) (2.5 MG/3ML) 0.083% nebulizer solution Take 3 mLs (2.5 mg total) by nebulization every 4 (four) hours as needed for wheezing or shortness of breath. 30 vial 0  .  amiodarone (PACERONE) 200 MG tablet Take 1 tablet (200 mg total) by mouth daily. 30 tablet 3  . apixaban (ELIQUIS) 5 MG TABS tablet Take 1 tablet (5 mg total) by mouth 2 (two) times daily. 60 tablet 2  . atorvastatin (LIPITOR) 20 MG tablet Take 20 mg by mouth daily.    . budesonide-formoterol (SYMBICORT) 80-4.5 MCG/ACT inhaler Inhale 2 puffs into the lungs in the morning and at bedtime.    . carvedilol (COREG) 12.5 MG tablet Take 1 tablet (12.5 mg total) by mouth 2 (two) times daily with a meal. 60 tablet 3  . fluticasone furoate-vilanterol (BREO ELLIPTA) 100-25 MCG/INH AEPB Inhale 1 puff into the lungs daily.    . furosemide (LASIX) 40 MG tablet Take 1 tablet (40 mg total) by mouth daily. 30 tablet 3  . losartan (COZAAR) 100 MG tablet Take 100 mg by mouth daily.    Marland Kitchen spironolactone (ALDACTONE) 25 MG tablet Take 1 tablet (25 mg total) by mouth 2 (two) times daily. 30 tablet 3   No current facility-administered medications for this encounter.    Allergies  Allergen Reactions  . Lisinopril Cough      Social History   Socioeconomic History  . Marital status: Single    Spouse name: Not on file  . Number of children: Not on file  . Years of education: Not on file  . Highest education level: Not on file  Occupational History  . Not on file  Tobacco Use  . Smoking status: Former Smoker    Packs/day: 0.50    Quit date: 2018    Years since quitting: 4.1  . Smokeless tobacco: Never Used  Substance and Sexual Activity  . Alcohol use: Not Currently  . Drug use: Not Currently    Types: Cocaine  . Sexual activity: Not Currently  Other Topics Concern  . Not on file  Social History Narrative  . Not on file   Social Determinants of Health   Financial Resource Strain: Medium Risk  . Difficulty of Paying Living Expenses: Somewhat hard  Food Insecurity: No Food Insecurity  . Worried About Programme researcher, broadcasting/film/video in the Last Year: Never true  . Ran Out of Food in the Last Year: Never true   Transportation Needs: No Transportation Needs  . Lack of Transportation (Medical): No  . Lack of Transportation (Non-Medical): No  Physical Activity: Inactive  . Days of Exercise per Week: 0 days  . Minutes of Exercise per Session: 0 min  Stress: Not on file  Social Connections: Not on file  Intimate Partner Violence: Not on file      Family History  Problem Relation Age of Onset  . Breast cancer Mother   . Heart attack Father 78  . Heart attack Paternal Uncle 72    Vitals:   03/18/20 1536  BP: 116/78  Pulse:  68  SpO2: 96%  Weight: 125.7 kg   Wt Readings from Last 3 Encounters:  03/18/20 125.7 kg  03/12/20 131.5 kg  10/07/15 117.9 kg    PHYSICAL EXAM: General:  Well appearing. No respiratory difficulty HEENT: normal Neck: supple. no JVD. Carotids 2+ bilat; no bruits. No lymphadenopathy or thryomegaly appreciated. Cor: PMI nondisplaced. Regular rate & rhythm. No rubs, gallops or murmurs. Lungs: clear Abdomen: soft, nontender, nondistended. No hepatosplenomegaly. No bruits or masses. Good bowel sounds. Extremities: no cyanosis, clubbing, rash, edema Neuro: alert & oriented x 3, cranial nerves grossly intact. moves all 4 extremities w/o difficulty. Affect pleasant.  ECG: NSR 70 bpm    ASSESSMENT & PLAN: 1. Chronic Systolic Heart Failure  -Echo 01/256 EF 40-45%  -NYHA III. Volume status stable. Will cut back lasix with addition of entresto.  -GDMT as noted below.  -BB-Continue coreg 12.5 mg twice a day  -Stop losartan and start entresto 24-26 mg twice a day. 30 day free card given to him.   -MRA- Cut back spiro to 25 mg daily. -SGLT2i- consider next visit - Check BMET today and at his follow up.   - Discussed   2. PAF -In SR today.  - Continue eliquis 5 mg twice a day.  -Continue amiodarone 200 mg daily   3. OSA -CPAP not working -Will need another sleep study.   Referred to HFSW ( Medications, Transportation, Financial ): Yes  Refer to Pharmacy: Yes  Refer to Home Health: No Refer to Advanced Heart Failure Clinic: Yes  Refer to General Cardiology: Followed by Dr Rudolpho Sevin   Follow up in 2 weeks in [facility] then back to Dr Rudolpho Sevin.   Sylwia Cuervo NP-C  4:26 PM

## 2020-03-19 MED FILL — ENTRESTO 24 MG-26 MG TABLET: 24-26 | 30 days supply | Qty: 60 | Fill #0

## 2020-03-19 NOTE — Telephone Encounter (Signed)
Heart Failure Patient Advocate Encounter  Received notification from [employer] that prior authorization for Donald [name] is required.  PA submitted on CoverMyMeds Key [id number] Status is approved through 03/18/21   Sharen Hones, PharmD, BCPS Heart Failure Stewardship Pharmacist Phone [phone]

## 2020-04-18 ENCOUNTER — Encounter (HOSPITAL_COMMUNITY): Payer: Medicaid Other

## 2020-04-25 ENCOUNTER — Encounter (HOSPITAL_COMMUNITY): Payer: Self-pay

## 2020-04-25 ENCOUNTER — Other Ambulatory Visit: Payer: Self-pay

## 2020-04-25 ENCOUNTER — Ambulatory Visit (HOSPITAL_COMMUNITY)
Admission: RE | Admit: 2020-04-25 | Discharge: 2020-04-25 | Disposition: A | Discharge status: Home / Self Care | Payer: Medicaid Other | Source: Ambulatory Visit | Attending: Internal Medicine | Admitting: Internal Medicine

## 2020-04-25 ENCOUNTER — Telehealth (HOSPITAL_COMMUNITY): Payer: Self-pay

## 2020-04-25 NOTE — Telephone Encounter (Signed)
Call attempted to confirm HV TOC appt today 3/31 @ 11AM. HIPPA appropriate VM left with callback number.   Ozella Rocks, RN, BSN Heart Failure Nurse Navigator [phone]

## 2020-05-05 MED FILL — Sacubitril-Valsartan Tab 24-26 MG: ORAL | 30 days supply | Qty: 60 | Fill #0 | Status: AC

## 2020-05-06 ENCOUNTER — Other Ambulatory Visit: Payer: Self-pay

## 2020-08-06 ENCOUNTER — Other Ambulatory Visit (HOSPITAL_COMMUNITY): Payer: Medicaid Other

## 2020-08-06 ENCOUNTER — Inpatient Hospital Stay (HOSPITAL_COMMUNITY)
Admission: EM | Admit: 2020-08-06 | Discharge: 2020-08-15 | DRG: 175 | Disposition: A | Discharge status: Home / Self Care | Payer: Medicaid Other | Attending: Internal Medicine | Admitting: Internal Medicine

## 2020-08-06 ENCOUNTER — Inpatient Hospital Stay (HOSPITAL_COMMUNITY): Payer: Medicaid Other

## 2020-08-06 ENCOUNTER — Emergency Department (HOSPITAL_COMMUNITY): Payer: Medicaid Other

## 2020-08-06 ENCOUNTER — Encounter (HOSPITAL_COMMUNITY): Payer: Self-pay | Admitting: *Deleted

## 2020-08-06 DIAGNOSIS — I959 Hypotension, unspecified: Secondary | ICD-10-CM | POA: Diagnosis present

## 2020-08-06 DIAGNOSIS — E871 Hypo-osmolality and hyponatremia: Secondary | ICD-10-CM | POA: Diagnosis not present

## 2020-08-06 DIAGNOSIS — I4892 Unspecified atrial flutter: Secondary | ICD-10-CM | POA: Diagnosis present

## 2020-08-06 DIAGNOSIS — Z7901 Long term (current) use of anticoagulants: Secondary | ICD-10-CM | POA: Diagnosis not present

## 2020-08-06 DIAGNOSIS — J449 Chronic obstructive pulmonary disease, unspecified: Secondary | ICD-10-CM | POA: Diagnosis not present

## 2020-08-06 DIAGNOSIS — E785 Hyperlipidemia, unspecified: Secondary | ICD-10-CM | POA: Diagnosis present

## 2020-08-06 DIAGNOSIS — Z9114 Patient's other noncompliance with medication regimen: Secondary | ICD-10-CM

## 2020-08-06 DIAGNOSIS — I340 Nonrheumatic mitral (valve) insufficiency: Secondary | ICD-10-CM | POA: Diagnosis not present

## 2020-08-06 DIAGNOSIS — I10 Essential (primary) hypertension: Secondary | ICD-10-CM | POA: Diagnosis not present

## 2020-08-06 DIAGNOSIS — J189 Pneumonia, unspecified organism: Secondary | ICD-10-CM | POA: Diagnosis present

## 2020-08-06 DIAGNOSIS — I428 Other cardiomyopathies: Secondary | ICD-10-CM | POA: Diagnosis present

## 2020-08-06 DIAGNOSIS — Z888 Allergy status to other drugs, medicaments and biological substances status: Secondary | ICD-10-CM

## 2020-08-06 DIAGNOSIS — I480 Paroxysmal atrial fibrillation: Secondary | ICD-10-CM | POA: Diagnosis present

## 2020-08-06 DIAGNOSIS — Z20822 Contact with and (suspected) exposure to covid-19: Secondary | ICD-10-CM | POA: Diagnosis present

## 2020-08-06 DIAGNOSIS — R55 Syncope and collapse: Secondary | ICD-10-CM | POA: Diagnosis present

## 2020-08-06 DIAGNOSIS — E119 Type 2 diabetes mellitus without complications: Secondary | ICD-10-CM | POA: Diagnosis present

## 2020-08-06 DIAGNOSIS — I2609 Other pulmonary embolism with acute cor pulmonale: Secondary | ICD-10-CM

## 2020-08-06 DIAGNOSIS — Z7951 Long term (current) use of inhaled steroids: Secondary | ICD-10-CM

## 2020-08-06 DIAGNOSIS — D72829 Elevated white blood cell count, unspecified: Secondary | ICD-10-CM | POA: Diagnosis present

## 2020-08-06 DIAGNOSIS — Z9989 Dependence on other enabling machines and devices: Secondary | ICD-10-CM | POA: Diagnosis not present

## 2020-08-06 DIAGNOSIS — I110 Hypertensive heart disease with heart failure: Secondary | ICD-10-CM | POA: Diagnosis present

## 2020-08-06 DIAGNOSIS — J9621 Acute and chronic respiratory failure with hypoxia: Secondary | ICD-10-CM | POA: Diagnosis present

## 2020-08-06 DIAGNOSIS — I2510 Atherosclerotic heart disease of native coronary artery without angina pectoris: Secondary | ICD-10-CM | POA: Diagnosis present

## 2020-08-06 DIAGNOSIS — I5082 Biventricular heart failure: Secondary | ICD-10-CM | POA: Diagnosis present

## 2020-08-06 DIAGNOSIS — Z8673 Personal history of transient ischemic attack (TIA), and cerebral infarction without residual deficits: Secondary | ICD-10-CM

## 2020-08-06 DIAGNOSIS — Z79899 Other long term (current) drug therapy: Secondary | ICD-10-CM | POA: Diagnosis not present

## 2020-08-06 DIAGNOSIS — Z6841 Body Mass Index (BMI) 40.0 and over, adult: Secondary | ICD-10-CM | POA: Diagnosis not present

## 2020-08-06 DIAGNOSIS — Z7982 Long term (current) use of aspirin: Secondary | ICD-10-CM

## 2020-08-06 DIAGNOSIS — Z87891 Personal history of nicotine dependence: Secondary | ICD-10-CM

## 2020-08-06 DIAGNOSIS — I5043 Acute on chronic combined systolic (congestive) and diastolic (congestive) heart failure: Secondary | ICD-10-CM | POA: Diagnosis present

## 2020-08-06 DIAGNOSIS — I5023 Acute on chronic systolic (congestive) heart failure: Secondary | ICD-10-CM

## 2020-08-06 DIAGNOSIS — Z86711 Personal history of pulmonary embolism: Secondary | ICD-10-CM

## 2020-08-06 DIAGNOSIS — I4891 Unspecified atrial fibrillation: Secondary | ICD-10-CM | POA: Diagnosis not present

## 2020-08-06 DIAGNOSIS — I2699 Other pulmonary embolism without acute cor pulmonale: Principal | ICD-10-CM | POA: Diagnosis present

## 2020-08-06 DIAGNOSIS — G4733 Obstructive sleep apnea (adult) (pediatric): Secondary | ICD-10-CM | POA: Diagnosis present

## 2020-08-06 DIAGNOSIS — Z8249 Family history of ischemic heart disease and other diseases of the circulatory system: Secondary | ICD-10-CM

## 2020-08-06 DIAGNOSIS — E876 Hypokalemia: Secondary | ICD-10-CM | POA: Diagnosis not present

## 2020-08-06 DIAGNOSIS — I351 Nonrheumatic aortic (valve) insufficiency: Secondary | ICD-10-CM | POA: Diagnosis not present

## 2020-08-06 DIAGNOSIS — I2722 Pulmonary hypertension due to left heart disease: Secondary | ICD-10-CM | POA: Diagnosis present

## 2020-08-06 DIAGNOSIS — J441 Chronic obstructive pulmonary disease with (acute) exacerbation: Secondary | ICD-10-CM

## 2020-08-06 LAB — CBC
HCT: 43.1 % (ref 39.0–52.0)
Hemoglobin: 13.6 g/dL (ref 13.0–17.0)
MCH: 27.1 pg (ref 26.0–34.0)
MCHC: 31.6 g/dL (ref 30.0–36.0)
MCV: 85.9 fL (ref 80.0–100.0)
Platelets: 240 10*3/uL (ref 150–400)
RBC: 5.02 MIL/uL (ref 4.22–5.81)
RDW: 16 % — ABNORMAL HIGH (ref 11.5–15.5)
WBC: 11.8 10*3/uL — ABNORMAL HIGH (ref 4.0–10.5)
nRBC: 0 % (ref 0.0–0.2)

## 2020-08-06 LAB — RESP PANEL BY RT-PCR (FLU A&B, COVID) ARPGX2
Influenza A by PCR: NEGATIVE
Influenza B by PCR: NEGATIVE
SARS Coronavirus 2 by RT PCR: NEGATIVE

## 2020-08-06 LAB — MAGNESIUM: Magnesium: 1.6 mg/dL — ABNORMAL LOW (ref 1.7–2.4)

## 2020-08-06 LAB — BASIC METABOLIC PANEL
Anion gap: 10 (ref 5–15)
BUN: 8 mg/dL (ref 6–20)
CO2: 25 mmol/L (ref 22–32)
Calcium: 8.3 mg/dL — ABNORMAL LOW (ref 8.9–10.3)
Chloride: 99 mmol/L (ref 98–111)
Creatinine, Ser: 1.21 mg/dL (ref 0.61–1.24)
GFR, Estimated: >60 mL/min (ref >60)
Glucose, Bld: 149 mg/dL — ABNORMAL HIGH (ref 70–99)
Potassium: 3.7 mmol/L (ref 3.5–5.1)
Sodium: 134 mmol/L — ABNORMAL LOW (ref 135–145)

## 2020-08-06 LAB — TROPONIN I (HIGH SENSITIVITY)
Troponin I (High Sensitivity): 20 ng/L — ABNORMAL HIGH (ref <18)
Troponin I (High Sensitivity): 21 ng/L — ABNORMAL HIGH (ref <18)

## 2020-08-06 LAB — TSH: TSH: 0.536 u[IU]/mL (ref 0.350–4.500)

## 2020-08-06 LAB — BRAIN NATRIURETIC PEPTIDE: B Natriuretic Peptide: 442.4 pg/mL — ABNORMAL HIGH (ref 0.0–100.0)

## 2020-08-06 LAB — LACTIC ACID, PLASMA: Lactic Acid, Venous: 1.6 mmol/L (ref 0.5–1.9)

## 2020-08-06 LAB — HEMOGLOBIN A1C
Hgb A1c MFr Bld: 7.2 % — ABNORMAL HIGH (ref 4.8–5.6)
Mean Plasma Glucose: 159.94 mg/dL

## 2020-08-06 LAB — CBG MONITORING, ED: Glucose-Capillary: 130 mg/dL — ABNORMAL HIGH (ref 70–99)

## 2020-08-06 LAB — APTT: aPTT: 34 seconds (ref 24–36)

## 2020-08-06 LAB — PROCALCITONIN: Procalcitonin: 0.25 ng/mL

## 2020-08-06 LAB — GLUCOSE, CAPILLARY: Glucose-Capillary: 174 mg/dL — ABNORMAL HIGH (ref 70–99)

## 2020-08-06 MED ORDER — LEVALBUTEROL HCL 0.63 MG/3ML IN NEBU
0.6300 mg | INHALATION_SOLUTION | Freq: Four times a day (QID) | RESPIRATORY_TRACT | Status: DC | PRN
  Administered 2020-08-07: 0.63 mg via RESPIRATORY_TRACT
  Filled 2020-08-06: qty 3

## 2020-08-06 MED ORDER — MAGNESIUM SULFATE 2 GM/50ML IV SOLN
2.0000 g | Freq: Once | INTRAVENOUS | Status: AC
  Administered 2020-08-06: 2 g via INTRAVENOUS
  Filled 2020-08-06: qty 50

## 2020-08-06 MED ORDER — ACETAMINOPHEN 650 MG RE SUPP: 650.0000 mg | Freq: Four times a day (QID) | RECTAL | Status: DC | PRN

## 2020-08-06 MED ORDER — SODIUM CHLORIDE 0.9 % IV SOLN
500.0000 mg | INTRAVENOUS | Status: DC
  Filled 2020-08-06: qty 500

## 2020-08-06 MED ORDER — METOPROLOL TARTRATE 5 MG/5ML IV SOLN
5.0000 mg | INTRAVENOUS | Status: DC | PRN
  Administered 2020-08-06 – 2020-08-12 (×5): 5 mg via INTRAVENOUS
  Filled 2020-08-06 (×5): qty 5

## 2020-08-06 MED ORDER — POLYETHYLENE GLYCOL 3350 17 G PO PACK: 17.0000 g | PACK | Freq: Every day | ORAL | Status: DC | PRN

## 2020-08-06 MED ORDER — SODIUM CHLORIDE 0.9% FLUSH: 3.0000 mL | INTRAVENOUS | Status: DC | PRN

## 2020-08-06 MED ORDER — METOPROLOL TARTRATE 50 MG PO TABS
50.0000 mg | ORAL_TABLET | Freq: Two times a day (BID) | ORAL | Status: DC
  Administered 2020-08-06 – 2020-08-08 (×4): 50 mg via ORAL
  Filled 2020-08-06 (×4): qty 1

## 2020-08-06 MED ORDER — SODIUM CHLORIDE 0.9 % IV SOLN
1.0000 g | INTRAVENOUS | Status: DC
  Filled 2020-08-06: qty 10

## 2020-08-06 MED ORDER — SODIUM CHLORIDE 0.9 % IV SOLN
500.0000 mg | Freq: Once | INTRAVENOUS | Status: AC
  Administered 2020-08-06: 500 mg via INTRAVENOUS
  Filled 2020-08-06: qty 500

## 2020-08-06 MED ORDER — GUAIFENESIN ER 600 MG PO TB12
600.0000 mg | ORAL_TABLET | Freq: Two times a day (BID) | ORAL | Status: DC
  Administered 2020-08-06 – 2020-08-15 (×18): 600 mg via ORAL
  Filled 2020-08-06 (×18): qty 1

## 2020-08-06 MED ORDER — SODIUM CHLORIDE 0.9 % IV SOLN
1.0000 g | Freq: Once | INTRAVENOUS | Status: AC
  Administered 2020-08-06: 1 g via INTRAVENOUS
  Filled 2020-08-06: qty 10

## 2020-08-06 MED ORDER — SODIUM CHLORIDE 0.9 % IV SOLN: 250.0000 mL | INTRAVENOUS | Status: DC | PRN

## 2020-08-06 MED ORDER — SODIUM CHLORIDE 0.9% FLUSH
3.0000 mL | Freq: Two times a day (BID) | INTRAVENOUS | Status: DC
  Administered 2020-08-06 – 2020-08-15 (×13): 3 mL via INTRAVENOUS

## 2020-08-06 MED ORDER — ACETAMINOPHEN 325 MG PO TABS: 650.0000 mg | ORAL_TABLET | Freq: Four times a day (QID) | ORAL | Status: DC | PRN

## 2020-08-06 MED ORDER — HEPARIN (PORCINE) 25000 UT/250ML-% IV SOLN
2300.0000 [IU]/h | INTRAVENOUS | Status: DC
  Administered 2020-08-06: 1950 [IU]/h via INTRAVENOUS
  Administered 2020-08-07: 2300 [IU]/h via INTRAVENOUS
  Filled 2020-08-06 (×2): qty 250

## 2020-08-06 MED ORDER — HEPARIN BOLUS VIA INFUSION
6000.0000 [IU] | Freq: Once | INTRAVENOUS | Status: AC
  Administered 2020-08-06: 6000 [IU] via INTRAVENOUS
  Filled 2020-08-06: qty 6000

## 2020-08-06 MED ORDER — DOCUSATE SODIUM 100 MG PO CAPS: 100.0000 mg | ORAL_CAPSULE | Freq: Two times a day (BID) | ORAL | Status: DC | PRN

## 2020-08-06 MED ORDER — INSULIN ASPART 100 UNIT/ML IJ SOLN
0.0000 [IU] | Freq: Three times a day (TID) | INTRAMUSCULAR | Status: DC
  Administered 2020-08-06: 1 [IU] via SUBCUTANEOUS
  Administered 2020-08-07 (×2): 2 [IU] via SUBCUTANEOUS
  Administered 2020-08-07: 1 [IU] via SUBCUTANEOUS
  Administered 2020-08-08: 2 [IU] via SUBCUTANEOUS
  Administered 2020-08-08: 1 [IU] via SUBCUTANEOUS
  Administered 2020-08-08: 2 [IU] via SUBCUTANEOUS
  Administered 2020-08-09 – 2020-08-10 (×4): 1 [IU] via SUBCUTANEOUS
  Administered 2020-08-10 – 2020-08-11 (×2): 2 [IU] via SUBCUTANEOUS
  Administered 2020-08-11: 3 [IU] via SUBCUTANEOUS
  Administered 2020-08-11: 2 [IU] via SUBCUTANEOUS
  Administered 2020-08-12: 3 [IU] via SUBCUTANEOUS
  Administered 2020-08-12: 2 [IU] via SUBCUTANEOUS
  Administered 2020-08-13: 1 [IU] via SUBCUTANEOUS
  Administered 2020-08-13: 5 [IU] via SUBCUTANEOUS
  Administered 2020-08-13 – 2020-08-14 (×3): 2 [IU] via SUBCUTANEOUS
  Administered 2020-08-14 – 2020-08-15 (×2): 1 [IU] via SUBCUTANEOUS
  Administered 2020-08-15: 2 [IU] via SUBCUTANEOUS

## 2020-08-06 MED ORDER — METOPROLOL TARTRATE 25 MG PO TABS
25.0000 mg | ORAL_TABLET | Freq: Once | ORAL | Status: AC
  Administered 2020-08-06: 25 mg via ORAL
  Filled 2020-08-06: qty 1

## 2020-08-06 MED ORDER — CHLORHEXIDINE GLUCONATE CLOTH 2 % EX PADS
6.0000 | MEDICATED_PAD | Freq: Every day | CUTANEOUS | Status: DC
  Administered 2020-08-07 – 2020-08-14 (×5): 6 via TOPICAL

## 2020-08-06 MED ORDER — AMIODARONE HCL 200 MG PO TABS
200.0000 mg | ORAL_TABLET | Freq: Every day | ORAL | Status: DC
  Administered 2020-08-06 – 2020-08-08 (×3): 200 mg via ORAL
  Filled 2020-08-06 (×3): qty 1

## 2020-08-06 MED ORDER — IOHEXOL 350 MG/ML SOLN
50.0000 mL | Freq: Once | INTRAVENOUS | Status: AC | PRN
  Administered 2020-08-06: 50 mL via INTRAVENOUS

## 2020-08-06 NOTE — Progress Notes (Addendum)
ANTICOAGULATION CONSULT NOTE - Initial Consult  Pharmacy Consult for Heparin Indication: pulmonary embolus  Allergies  Allergen Reactions   Lisinopril Cough    Patient Measurements:   Heparin Dosing Weight: 108 kg  Vital Signs: Temp: 98.1 F (36.7 C) (07/12 1339) Temp Source: Oral (07/12 1339) BP: 109/89 (07/12 1645) Pulse Rate: 110 (07/12 1645)  Labs: Recent Labs    08/06/20 0754 08/06/20 1102  HGB 13.6  --   HCT 43.1  --   PLT 240  --   CREATININE 1.21  --   TROPONINIHS 20* 21*   CrCl cannot be calculated (Unknown ideal weight.).  Medical History: Past Medical History:  Diagnosis Date   Arrhythmia    A fib    Atrial fibrillation (HCC)    CHF (congestive heart failure) (HCC)    COPD (chronic obstructive pulmonary disease) (HCC)    Hypertension    Sleep apnea    Stroke (HCC)    Medications:  (Not in a hospital admission)  Scheduled:   guaiFENesin  600 mg Oral BID   insulin aspart  0-9 Units Subcutaneous TID WC   sodium chloride flush  3 mL Intravenous Q12H   Infusions:   sodium chloride     [START ON 08/07/2020] azithromycin     [START ON 08/07/2020] cefTRIAXone (ROCEPHIN)  IV     heparin     magnesium sulfate bolus IVPB 2 g (08/06/20 1714)    Assessment: 60 year old man with PMHx significant for HTN, OSA (on CPAP), COPD, Afib (s/p cardioversion 03/12/2020) and Aflutter (s/p R-sided ablation 2019), PE (02/2020, on Eliquis), CVA (2018). Pt arrives with chest discomfort, palpitations and LE swelling. CT PE with bilateral PE lobar pulmonary arteries  Patient on eliquis at home last dose 08/05/20 evening. CBC is stable.   Goal of Therapy:  Heparin level 0.3-0.7 units/ml / aPTT Level 66-102 Monitor platelets by anticoagulation protocol: Yes   Plan:  Give 6000 units heparin now Start heparin infusion at 1950 units/hr Check anti-Xa level/aPTT levelin 8 hours and daily while on heparin Monitor aPTT level until correlates with aPTT Continue to monitor H&H  and platelets  Cathie Hoops 08/06/2020,5:15 PM

## 2020-08-06 NOTE — ED Notes (Signed)
Pt had a syncopal episode while laying on bed. RN was at bedside when pt became unresponsive. Both arms became stiff and pt was out for a few seconds. MD notified. Pt is now awake, alert and oriented. Will continue to monitor.

## 2020-08-06 NOTE — ED Provider Notes (Addendum)
Emergency Medicine Provider Triage Evaluation Note  Donald [name] , a 59 y.o. male  was evaluated in triage.  Pt complains of CP.  Began yesterday.  Associated shortness of breath.  Pain located right side.  History of prior PE.  He is on anticoagulation for A. fib.  Pain is not radiating to back, left arm or jaw.  No associated diaphoresis.  He feels like he has some swelling to his lower extremities and abdomen.  Review of Systems  Positive: Chest pain, shortness of breath, edema Negative: Syncope, fever, chills  Physical Exam  BP (!) 135/111   Pulse 64   Temp 99.7 F (37.6 C) (Oral)   Resp (!) 32   SpO2 95%  Gen:   Awake, no distress   Resp:  Normal effort  ABD:  Soft, distended MSK:   Moves extremities without difficulty  Other:    Medical Decision Making  Medically screening exam initiated at 7:57 AM.  Appropriate orders placed.  Donald [name] was informed that the remainder of the evaluation will be completed by another provider, this initial triage assessment does not replace that evaluation, and the importance of remaining in the ED until their evaluation is complete.  Chest pain, shortness of breath  EKG with a flutter with RVR.  Nursing notified patient needs room in back       Kayleena Eke A, PA-C 08/06/20 0759    Vanetta Mulders, MD 08/06/20 1123

## 2020-08-06 NOTE — Progress Notes (Signed)
Heart rate is too high for accurate echo at this time.   Please contact echo department if echo should be completed regardless of high heart rate.

## 2020-08-06 NOTE — ED Notes (Signed)
MD at bedside. Pt called out and states he feels like he is going to pass out and he is short of breath. O2 at 4LPM initiated. MD notified. BP has decreased, see notes. Dr.Zachowski at bedside. Will continue to monitor.

## 2020-08-06 NOTE — H&P (Signed)
History and Physical    JARRETTE DEHNER VPX:[id number] DOB: 01-09-1961 DOA: 08/06/2020  PCP: Renaye Rakers, MD Consultants:  cardiology: [facility]: Dr. Sandy Salaam  Patient coming from:  Home - lives with alone. NOK: aunt. Driscilla Moats   Chief Complaint: chest discomfort with palpitations.   HPI: Donald [name] is a 60 y.o. male with medical history significant of HTN, COPD, atrial fibrillation with cardioversion on 03/12/20, systolic CHF,  prediabetes, PE in 02/2020, hx of CVA in 2018, HLD, hx of right sided atrial flutter ablation in 2019 who presented to ER for chest discomfort from palpitations that started yesterday. It kept him up all night. He felt like his heart was going to jump out of his chest. He also had some chest pain and pressure. Pain was over right anterior chest. It did not radiate and was constant. He was short of breath with this. He has had a cough as well that is dry in nature. His cough causes his heart to "ache." He has also had some leg swelling and swelling in his stomach, he thinks he has gained some weight, unsure of how much. He denies any fever/chills. He denies any N/V, but has had some diarrhea today. He has no stomach pain. He denies any sick contacts. He feels like he has been putting on fluid over the past 8-9 months.   He does use oxygen during the day as needed. He states he will put it on 2-3L.   Echo: 02/2020: EF 40-45%. RV mildly reduced, RA/LA severely dilatd. Mild-mod MV regurgitation.   Hx of noncompliance with medication and states he is not sure he has taken his medication like he should.   ED Course: vitals: temp: 99.7, BP: 135/11, HR: 64, RR: 32, oxygen: 95% RA. CXR concerning for pneumonia and possible volume overload. Ekg: afib/flutter with rate of 134. Cardiology consulted. Advised iv lopressor and oral lopressor. Question if he has been taking medication. Given rocephin and zithromax in ER.  Had syncopal episode in ER witnessed by nurse. He needed  to have a BM and passed out and then woke up when they laid him down. BP reading low in chart, but ER doc said not accurate. He was talking and fine. Repeat BP did go back up. Asked to admit for afib and CAP. Cardiology following.   Review of Systems: As per HPI; otherwise review of systems reviewed and negative.   Ambulatory Status:  Ambulates without assistance  Covid vaccines: 2 moderna and 2 boosters   Past Medical History:  Diagnosis Date   Arrhythmia    A fib    Atrial fibrillation (HCC)    CHF (congestive heart failure) (HCC)    COPD (chronic obstructive pulmonary disease) (HCC)    Hypertension    Sleep apnea    Stroke [facility])     Past Surgical History:  Procedure Laterality Date   ABLATION     CARDIOVERSION N/A 03/12/2020   Procedure: CARDIOVERSION;  Surgeon: Chilton Si, MD;  Location[facility] ENDOSCOPY;  Service: Cardiovascular;  Laterality: N/A;   INCISION AND DRAINAGE PERIRECTAL ABSCESS N/A 10/20/2012   Procedure: IRRIGATION AND DEBRIDEMENT PERIRECTAL ABSCESS;  Surgeon: Wilmon Arms. Corliss Skains, MD;  Location: [facility] OR;  Service: General;  Laterality: N/A;  Lithotomy   TEE WITHOUT CARDIOVERSION N/A 03/12/2020   Procedure: TRANSESOPHAGEAL ECHOCARDIOGRAM (TEE);  Surgeon: Chilton Si, MD;  Location: [facility] ENDOSCOPY;  Service: Cardiovascular;  Laterality: N/A;    Social History   Socioeconomic History   Marital status: Single  Spouse name: Not on file   Number of children: Not on file   Years of education: Not on file   Highest education level: Not on file  Occupational History   Not on file  Tobacco Use   Smoking status: Former    Packs/day: 0.50    Pack years: 0.00    Types: Cigarettes    Quit date: 2018    Years since quitting: 4.5   Smokeless tobacco: Never  Substance and Sexual Activity   Alcohol use: Not Currently   Drug use: Not Currently    Types: Cocaine   Sexual activity: Not Currently  Other Topics Concern   Not on file  Social History Narrative   Not  on file   Social Determinants of Health   Financial Resource Strain: Medium Risk   Difficulty of Paying Living Expenses: Somewhat hard  Food Insecurity: No Food Insecurity   Worried About Programme researcher, broadcasting/film/video in the Last Year: Never true   Ran Out of Food in the Last Year: Never true  Transportation Needs: No Transportation Needs   Lack of Transportation (Medical): No   Lack of Transportation (Non-Medical): No  Physical Activity: Inactive   Days of Exercise per Week: 0 days   Minutes of Exercise per Session: 0 min  Stress: Not on file  Social Connections: Not on file  Intimate Partner Violence: Not on file    Allergies  Allergen Reactions   Lisinopril Cough    Family History  Problem Relation Age of Onset   Breast cancer Mother    Heart attack Father 30   Heart attack Paternal Uncle 41    Prior to Admission medications   Medication Sig Start Date End Date Taking? Authorizing Provider  albuterol (PROVENTIL HFA;VENTOLIN HFA) 108 (90 Base) MCG/ACT inhaler Inhale 2 puffs into the lungs every 4 (four) hours as needed for shortness of breath. 02/07/16 08/26/23 Yes [provider]  albuterol (PROVENTIL) (2.5 MG/3ML) 0.083% nebulizer solution Take 3 mLs (2.5 mg total) by nebulization every 4 (four) hours as needed for wheezing or shortness of breath. 10/07/15  Yes Rolan Bucco, MD  amiodarone (PACERONE) 200 MG tablet Take 1 tablet (200 mg total) by mouth daily. 03/12/20  Yes Danford, Earl Lites, MD  apixaban (ELIQUIS) 5 MG TABS tablet Take 1 tablet (5 mg total) by mouth 2 (two) times daily. 03/18/20  Yes Danford, Earl Lites, MD  aspirin 81 MG chewable tablet Chew 81 mg by mouth daily.   Yes [provider]  atorvastatin (LIPITOR) 20 MG tablet Take 20 mg by mouth daily. 01/30/20  Yes [provider]  budesonide-formoterol (SYMBICORT) 80-4.5 MCG/ACT inhaler Inhale 2 puffs into the lungs in the morning and at bedtime.   Yes [provider]  carvedilol  (COREG) 12.5 MG tablet Take 1 tablet (12.5 mg total) by mouth 2 (two) times daily with a meal. 03/12/20  Yes Danford, Earl Lites, MD  fluticasone furoate-vilanterol (BREO ELLIPTA) 100-25 MCG/INH AEPB Inhale 1 puff into the lungs daily.   Yes [provider]  furosemide (LASIX) 40 MG tablet Take 0.5 tablets (20 mg total) by mouth daily. 03/18/20  Yes Clegg, Amy D, NP  mometasone-formoterol (DULERA) 100-5 MCG/ACT AERO Inhale 1 puff into the lungs daily.   Yes [provider]  sacubitril-valsartan (ENTRESTO) 24-26 MG Take 1 tablet by mouth 2 (two) times daily. 03/18/20  Yes Clegg, Amy D, NP  spironolactone (ALDACTONE) 25 MG tablet Take 1 tablet (25 mg total) by mouth daily.  03/18/20  Yes Clegg, Amy D, NP  apixaban (ELIQUIS) 5 MG TABS tablet TAKE 2 TABLETS (10MG  TOTAL) BY MOUTH TWICE DAILY FOR 6 MORE DAYS, THEN TAKE 1 TABLET (5MG  TOTAL) BY MOUTH TWICE DAILY Patient not taking: No sig reported 03/12/20 03/12/21  Danford, 03/14/20, MD  sacubitril-valsartan (ENTRESTO) 24-26 MG TAKE 1 TABLET BY MOUTH 2 (TWO) TIMES DAILY. Patient not taking: No sig reported 03/18/20 03/18/21  03/20/20 D, NP    Physical Exam: Vitals:   08/06/20 1325 08/06/20 1334 08/06/20 1339 08/06/20 1345  BP: 97/67 98/83  97/71  Pulse: (!) 118 (!) 118  (!) 114  Resp: (!) 29 (!) 25  (!) 30  Temp:   98.1 F (36.7 C)   TempSrc:   Oral   SpO2: 98% 91%  93%     General:  Appears calm and comfortable and is mildly distressed.  Eyes:  PERRL, EOMI, normal lids, iris ENT:  grossly normal hearing, lips & tongue, mmm; appropriate dentition Neck:  no LAD, masses or thyromegaly; no carotid bruits Cardiovascular: irregularly irregular, no m/r/g. Pitting edema to below the knees bilaterally.  Respiratory:   faint crackles in LLL. No wheezing. Increased work of breathing with talking     Abdomen:  soft, NT, ND, NABS Back:   normal alignment, no CVAT Skin:  no rash or induration seen on limited exam Musculoskeletal:   grossly normal tone BUE/BLE, good ROM, no bony abnormality Lower extremity: Limited foot exam with no ulcerations.  2+ distal pulses. Psychiatric:  flat mood and affect, speech slow, but appropriate,  AOx3 Neurologic:  CN 2-12 grossly intact, moves all extremities in coordinated fashion, sensation intact   Radiological Exams on Admission: Independently reviewed - see discussion in A/P where applicable  DG Chest 2 View  Result Date: 08/06/2020 CLINICAL DATA:  Shortness of breath, chest pain EXAM: CHEST - 2 VIEW COMPARISON:  03/07/2020 FINDINGS: Cardiomegaly, vascular congestion. Small right pleural effusion. Right lower lobe airspace disease again noted, similar to prior study. No confluent opacity on the left. No acute bony abnormality. IMPRESSION: Cardiomegaly, vascular congestion. Continued small right pleural effusion with right lower lobe airspace disease concerning for recurrent pneumonia. Electronically Signed   By: 10/07/2020 M.D.   On: 08/06/2020 08:11    EKG: Independently reviewed.  Atrial flutter with rate of 134; nonspecific ST changes with no evidence of acute ischemia. Previous tracing was NSR and before was afib.    Labs on Admission: I have personally reviewed the available labs and imaging studies at the time of the admission.  Pertinent labs:  Wbc: 11.8 Troponin 20-->21 Bnp: 442 Lactic acid: 1.6  Assessment/Plan Principal Problem:   Atrial fibrillation with RVR [facility]) -cardiology consulted.  -? Medical noncompliance vs. CAP as instigating factors. CTA pending  -IV labetalol and oral lopressor given in ER per cardiology, f/u on their recommendations with his home medication.  -on oxygen, but never hypoxic and really can't get a grasp on his baseline as he wears oxygen prn at home during the day.   New bilateral PE on top of known PE with large clot burden -discussed with critical care, Dr. 10/07/2020. With episode of syncope they will transfer care to their service.   -starting heparin drip  -echo pending  -do not believe he failed eliquis as he has been noncompliant with taking medication    CAP (community acquired pneumonia) -? If true pneumonia or more fluid overload from CHF and afib with RVR -procalcitonin added -continue rocephin  and azithromycin for now and clinically follow.     Leukocytosis -CAP vs. Hemoconcentrated.  -abx and trend   Acute on chronic systolic CHF -elevated BNP, fluid, weight gain. Unsure of weight -strict I/O, daily weights -mag pending -repeat echo  -follow cards recommendation for diuresis in light of PE -likely secondary to medical non compliance and afib with RVR.     Pulmonary embolism (HCC) -CTA  -do not think he is complaint with his eliquis  -continue eliquis, SW consult to help with medication     Essential hypertension -labile with some soft readings.  -will resume home meds pending cardiology input.     COPD (chronic obstructive pulmonary disease) (HCC) -stable, on clinical signs of exacerbation -continue SABA prn, xoponex in light of afib  -home inhalers.   Diabetes -a1c: 7.4 -on no medication -repeat A1c, carb modified diet  -SSI/accuchecks and diabetic education     OSA on CPAP  Will order cpap for night.   Medication non compliance -social work consult  There is no height or weight on file to calculate BMI.  Level of care: Telemetry Cardiac DVT prophylaxis:  heparin  Code Status:  Full - confirmed with patient  Family Communication: called and discussed with his aunt: Driscilla Moats: [phone] Disposition Plan:  The patient is from: home  Patient is currently: acutely ill Consults called: cardiology and critical care  Admission status:  inpatient    Orland Mustard MD [facility]   How to contact the [facility] Attending or Consulting provider 7A - 7P or covering provider during after hours 7P -7A, for this patient?  Check the care team in [facility] and look for a)  attending/consulting [facility] provider listed and b) the [facility] team listed Log into [email or web address] and use [employer]'s universal password to access. If you do not have the password, please contact the hospital operator. Locate the [facility] provider you are looking for under [facility] and page to a number that you can be directly reached. If you still have difficulty reaching the provider, please page the [facility] (Director on Call) for the Hospitalists listed on amion for assistance.   08/06/2020, 2:14 PM

## 2020-08-06 NOTE — ED Triage Notes (Signed)
To ED for eval of sob and cp since yesterday. Pt states he had difficulty sleeping last night. Hx of MI, afib, stroke per pt. Pt breathing fast. CP is described as pressure and in left side of chest without radiation. No nausea or vomiting.

## 2020-08-06 NOTE — ED Provider Notes (Addendum)
Manual [facility] EMERGENCY DEPARTMENT Provider Note   CSN: [id number] Arrival date & time: 08/06/20  0737     History Chief Complaint  Patient presents with   Chest Pain    Donald [name] is a 60 y.o. male.  Patient started not feeling well yesterday.  This morning he had palpitations and some right-sided chest pain.  Past medical history is significant for CVA in 2018 COPD hypertension chronic systolic heart failure and a history of atrial fibrillation.  Patient is on medications for this to include amiodarone Toprol-XL Lasix and digoxin.  Patient is not sure he has been taking his medications properly which includes Eliquis.  As he gets confused.  He is followed by cardiology in the [facility] area he was seen by [facility] cardiology in February.  Last seen by his cardiologist in [facility] on April 08, 2020.  Patient was's screened out in triage.  Was noted to be atrial febrile flutter with RVR.  But it took 3 hours to get him back.  Triage stated that the chest pain was left-sided patient points to the right side for me.      Past Medical History:  Diagnosis Date   Arrhythmia    A fib    Atrial fibrillation (HCC)    CHF (congestive heart failure) (HCC)    COPD (chronic obstructive pulmonary disease) (HCC)    Hypertension    Sleep apnea    Stroke [facility])     Patient Active Problem List   Diagnosis Date Noted   Atrial fibrillation with RVR (HCC) 03/08/2020   Pulmonary embolism (HCC) 03/08/2020   Essential hypertension 03/08/2020   COPD (chronic obstructive pulmonary disease) (HCC) 03/08/2020   Anemia 03/08/2020   COPD exacerbation (HCC) 11/16/2014   Atypical chest pain 11/16/2014   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 Corliss Skains, MD;  Location: [facility] OR;  Service: General;  Laterality: N/A;  Lithotomy   TEE WITHOUT CARDIOVERSION N/A 03/12/2020   Procedure: TRANSESOPHAGEAL ECHOCARDIOGRAM (TEE);  Surgeon: Chilton Si, MD;  Location: [facility] ENDOSCOPY;  Service: Cardiovascular;  Laterality: N/A;       Family History  Problem Relation Age of Onset   Breast cancer Mother    Heart attack Father 77   Heart attack Paternal Uncle 92    Social History   Tobacco Use   Smoking status: Former    Packs/day: 0.50    Pack years: 0.00    Types: Cigarettes    Quit date: 2018    Years since quitting: 4.5   Smokeless tobacco: Never  Substance Use Topics   Alcohol use: Not Currently   Drug use: Not Currently    Types: Cocaine    Home Medications Prior to Admission medications   Medication Sig Start Date End Date Taking? Authorizing Provider  albuterol (PROVENTIL HFA;VENTOLIN HFA) 108 (90 Base) MCG/ACT inhaler Inhale 2 puffs into the lungs every 4 (four) hours as needed for shortness of breath. 02/07/16 08/26/23 Yes [provider]  albuterol (PROVENTIL) (2.5 MG/3ML) 0.083% nebulizer solution Take 3 mLs (2.5 mg total) by nebulization every 4 (four) hours as needed for wheezing or shortness of breath. 10/07/15  Yes Rolan Bucco, MD  amiodarone (PACERONE) 200 MG tablet Take 1 tablet (200 mg total) by mouth daily. 03/12/20  Yes Danford, Earl Lites, MD  apixaban (ELIQUIS) 5 MG TABS tablet Take 1 tablet (5 mg total) by mouth 2 (two) times daily. 03/18/20  Yes Danford, Earl Lites, MD  aspirin 81 MG chewable tablet Chew 81 mg by mouth daily.   Yes [provider]  atorvastatin (LIPITOR) 20 MG tablet Take 20 mg by  mouth daily. 01/30/20  Yes [provider]  budesonide-formoterol (SYMBICORT) 80-4.5 MCG/ACT inhaler Inhale 2 puffs into the lungs in the morning and at bedtime.   Yes [provider]  carvedilol (COREG) 12.5 MG tablet Take 1 tablet (12.5 mg total) by mouth 2 (two) times daily with a meal. 03/12/20  Yes Danford, Earl Lites, MD  fluticasone furoate-vilanterol (BREO ELLIPTA) 100-25 MCG/INH AEPB Inhale 1 puff into the lungs daily.   Yes [provider]  furosemide (LASIX) 40 MG tablet Take 0.5 tablets (20 mg total) by mouth daily. 03/18/20  Yes Clegg, Amy D, NP  mometasone-formoterol (DULERA) 100-5 MCG/ACT AERO Inhale 1 puff into the lungs daily.   Yes [provider]  sacubitril-valsartan (ENTRESTO) 24-26 MG Take 1 tablet by mouth 2 (two) times daily. 03/18/20  Yes Clegg, Amy D, NP  spironolactone (ALDACTONE) 25 MG tablet Take 1 tablet (25 mg total) by mouth daily. 03/18/20  Yes Clegg, Amy D, NP  apixaban (ELIQUIS) 5 MG TABS tablet TAKE 2 TABLETS (10MG  TOTAL) BY MOUTH TWICE DAILY FOR 6 MORE DAYS, THEN TAKE 1 TABLET (5MG  TOTAL) BY MOUTH TWICE DAILY Patient not taking: No sig reported 03/12/20 03/12/21  Danford, 03/14/20, MD  sacubitril-valsartan (ENTRESTO) 24-26 MG TAKE 1 TABLET BY MOUTH 2 (TWO) TIMES DAILY. Patient not taking: No sig reported 03/18/20 03/18/21  03/20/20 D, NP    Allergies    Lisinopril  Review of Systems   Review of Systems  Constitutional:  Negative for chills and fever.  HENT:  Negative for ear pain and sore throat.   Eyes:  Negative for pain and visual disturbance.  Respiratory:  Negative for cough and shortness of breath.   Cardiovascular:  Positive for chest pain and palpitations.  Gastrointestinal:  Negative for abdominal pain and vomiting.  Genitourinary:  Negative for dysuria and hematuria.  Musculoskeletal:  Negative for arthralgias and back pain.  Skin:  Negative for color change and rash.  Neurological:  Negative for seizures  and syncope.  All other systems reviewed and are negative.  Physical Exam Updated Vital Signs BP 138/86 (BP Location: Right Arm)   Pulse (!) 43   Temp 99.7 F (37.6 C) (Oral)   Resp (!) 24   SpO2 96%   Physical Exam Vitals and nursing note reviewed.  Constitutional:      General: He is in acute distress.     Appearance: Normal appearance. He is well-developed.  HENT:     Head: Normocephalic and atraumatic.  Eyes:     Extraocular Movements: Extraocular movements intact.     Conjunctiva/sclera: Conjunctivae normal.     Pupils: Pupils are equal, round, and reactive to light.  Cardiovascular:     Rate and Rhythm: Tachycardia present. Rhythm irregular.     Heart sounds: No murmur heard. Pulmonary:     Effort: Pulmonary effort is  normal. No respiratory distress.     Breath sounds: Normal breath sounds. No wheezing or rales.  Chest:     Chest wall: No tenderness.  Abdominal:     Palpations: Abdomen is soft.     Tenderness: There is no abdominal tenderness.  Musculoskeletal:        General: No swelling.     Cervical back: Neck supple.  Skin:    General: Skin is warm and dry.  Neurological:     General: No focal deficit present.     Mental Status: He is alert and oriented to person, place, and time.     Cranial Nerves: No cranial nerve deficit.     Sensory: No sensory deficit.     Motor: No weakness.    ED Results / Procedures / Treatments   Labs (all labs ordered are listed, but only abnormal results are displayed) Labs Reviewed  BASIC METABOLIC PANEL - Abnormal; Notable for the following components:      Result Value   Sodium 134 (*)    Glucose, Bld 149 (*)    Calcium 8.3 (*)    All other components within normal limits  CBC - Abnormal; Notable for the following components:   WBC 11.8 (*)    RDW 16.0 (*)    All other components within normal limits  BRAIN NATRIURETIC PEPTIDE - Abnormal; Notable for the following components:   B Natriuretic Peptide 442.4 (*)     All other components within normal limits  TROPONIN I (HIGH SENSITIVITY) - Abnormal; Notable for the following components:   Troponin I (High Sensitivity) 20 (*)    All other components within normal limits  RESP PANEL BY RT-PCR (FLU A&B, COVID) [id number]  TROPONIN I (HIGH SENSITIVITY)    EKG EKG Interpretation  Date/Time:  Tuesday August 06 2020 07:43:59 EDT Ventricular Rate:  134 PR Interval:    QRS Duration: 88 QT Interval:  326 QTC Calculation: 486 R Axis:   101 Text Interpretation: Atrial flutter with variable A-V block Possible Right ventricular hypertrophy Nonspecific ST and T wave abnormality Abnormal ECG Confirmed by Vanetta Mulders [phone]) on 08/06/2020 11:13:53 AM  Radiology DG Chest 2 View  Result Date: 08/06/2020 CLINICAL DATA:  Shortness of breath, chest pain EXAM: CHEST - 2 VIEW COMPARISON:  03/07/2020 FINDINGS: Cardiomegaly, vascular congestion. Small right pleural effusion. Right lower lobe airspace disease again noted, similar to prior study. No confluent opacity on the left. No acute bony abnormality. IMPRESSION: Cardiomegaly, vascular congestion. Continued small right pleural effusion with right lower lobe airspace disease concerning for recurrent pneumonia. Electronically Signed   By: Charlett Nose M.D.   On: 08/06/2020 08:11    Procedures Procedures   CRITICAL CARE Performed by: Vanetta Mulders Total critical care time: 60 minutes Critical care time was exclusive of separately billable procedures and treating other patients. Critical care was necessary to treat or prevent imminent or life-threatening deterioration. Critical care was time spent personally by me on the following activities: development of treatment plan with patient and/or surrogate as well as nursing, discussions with consultants, evaluation of patient's response to treatment, examination of patient, obtaining history from patient or surrogate, ordering and performing treatments and interventions,  ordering and review of laboratory studies, ordering and review of radiographic studies, pulse oximetry and re-evaluation of patient's condition.   Medications Ordered in ED Medications - No data to display  ED Course  I have reviewed the triage vital signs and the nursing notes.  Pertinent labs & imaging  results that were available during my care of the patient were reviewed by me and considered in my medical decision making (see chart for details).    MDM Rules/Calculators/A&P                         I discussed with cardiology Dr. Antoine Poche.  Is not clear whether patient's been compliant with his Eliquis.  So we are going to not do cardioversion which is perfectly understandable.  He is recommending hitting him with IV beta-blocker and p.o. beta-blocker.  I will treat him for the right lower lobe pneumonia.  We will get lactic acid we will get blood cultures.  And start him on azithromycin and Rocephin.  Nurse understands the priority to get the heart rate under control.  The IV meta propyl on p.o. meta propyl all has been ordered.  Patient is receiving Rocephin and IV Zithromax for the right lower lobe pneumonia.  Patient responding well to the IV Lopressor as well as p.o. Lopressor.  Heart rate now down in the 1 teens.  Patient's troponins have been stable initial 1 elevated at 21.  Lactic acid is normal.  Electrolytes without significant abnormalities.  Renal function normal.  White blood cell count 11.8.  So mild leukocytosis.  Patient BNP elevated at 442.  Chest x-ray without frank pulmonary edema.  But does show cardiomegaly and some vascular congestion.  In the right lower lobe pneumonia.  Oxygen saturations 9596.  But her just recently patient needed increase in oxygen.  And is on 2 L of oxygen.  There was a brief episode of confusion about possible hypotension.  But feel that it was probably erroneous blood pressures.  Patient followed by Dr. Alvan Dame bland.  Will discuss with hospitalist  for admission.  As stated above Dr. Caryn Section from cardiology is aware of the patient.  And laid out the game plan for treating the atrial fib.  Final Clinical Impression(s) / ED Diagnoses Final diagnoses:  Atrial fibrillation with RVR (HCC)  Community acquired pneumonia of right lower lobe of lung    Rx / DC Orders ED Discharge Orders     None        Vanetta Mulders, MD 08/06/20 1312  Addendum: Called by nurse patient just had syncopal episode.  When he laid flat he woke up spontaneously.  Patient's not comfortable laying flat prefers to be sitting up.  They were trying to set him up at the bedside to get him to go to the bathroom when this occurred.  No evidence of any rhythm changes on the monitor.  Still with atrial fibrillation during this episode heart rate did go up to 130.  Last blood pressure was systolic 158.  They will be rechecking it.  Overall the blood pressures were recorded as being low.  But we do not think those were accurate.  Blood pressure was a systolic of 140 just on.  But were getting a machine blood pressure like 98/86 think that the manual systolic of 140 seems to be accurate.  Patient feeling back to normal following the syncopal episode.  Hospitalist will see.    Vanetta Mulders, MD 08/06/20 1317    Vanetta Mulders, MD 08/06/20 [phone]

## 2020-08-06 NOTE — H&P (Signed)
NAME:  Donald [name], MRN:  [id number], DOB:  [birth year], LOS: 0 ADMISSION DATE:  08/06/2020, CONSULTATION DATE:  08/06/2020 REFERRING MD:  Orland Mustard CHIEF COMPLAINT: Chest pain, pulmonary embolism   History of Present Illness:  60 year old man with PMHx significant for HTN, OSA (on CPAP), COPD, Afib (s/p cardioversion 03/12/2020) and Aflutter (s/p R-sided ablation 2019), PE (02/2020, on Eliquis), CVA (2018) who presented to [facility] ED 7/12 with chest discomfort, palpitations and LE swelling.  Patient reports that he began feeling poorly last night, but went to sleep. He woke around 0400 this AM with his CPAP machine and felt significant chest discomfort with palpitations. This was severe enough that it prompted him to seek medical attention. He presented to [facility] ED and while in ED had a brief syncopal episode; patient reports he "passed out" and doesn't remember what occurred, but quickly regained consciousness. Of note, patient does not smoke, he has not had recent travel or surgery, but has had a prior PE (02/2020).  In ED, vitals were notable for tachycardia to 110s, RR 24-30, SBP 90s-110s, O2 sat 93-98% on 4L [place]. Labs demonstrated normal WBC/H&H/Plt count, normal BMP with Cr 1.21, Mg 1.6 (repleted), troponin 20, LA 1.6, baseline PCT 0.25. CXR demonstrated cardiomegaly and vascular congestion with small R pleural effusions and RLL airspace disease c/f PNA. CT PE Protocol demonstrated bilateral acute pulmonary emboli with R heart strain.  PCCM consulted for PE management recommendations with decision to admit to ICU for close monitoring.  Pertinent Medical History:   Past Medical History:  Diagnosis Date   Arrhythmia    A fib    Atrial fibrillation (HCC)    CHF (congestive heart failure) (HCC)    COPD (chronic obstructive pulmonary disease) (HCC)    Hypertension    Sleep apnea    Stroke (HCC)    Significant Hospital Events: Including procedures, antibiotic start and stop dates in addition  to other pertinent events   7/12 Presented to ED with syncope, CP, SOB; CT PE with bilateral PE lobar pulmonary arteries; CCM consult, admit to ICU   Interim History / Subjective:  Admit to ICU for PE management  Objective:  Blood pressure (!) 113/96, pulse (!) 102, temperature 98.1 F (36.7 C), temperature source Oral, resp. rate (!) 26, SpO2 96 %.        Intake/Output Summary (Last 24 hours) at 08/06/2020 1651 Last data filed at 08/06/2020 1325 Gross per 24 hour  Intake 322.64 ml  Output --  Net 322.64 ml   There were no vitals filed for this visit.  Physical Examination: General: Acutely ill-appearing gentleman in NAD. HEENT: [place]/AT, anicteric sclera, PERRL, moist mucous membranes. Neuro: Awake, oriented x 4. Slowed speech secondary to aphasia (prior CVA).Responds to verbal stimuli. Following commands consistently. Moves all 4 extremities spontaneously.  CV: Irregularly irregular rhythm, no m/g/r. PULM: Breathing even and unlabored on 4L [place]. Lung fields clear in upper fields, diminished at bilateral bases. GI: Soft, nontender, nondistended. Normoactive bowel sounds. Extremities: Bilateral symmetric 1+ LE edema noted. Skin: Warm/dry, no rashes.  Resolved Hospital Problem List:    Assessment & Plan:   Acute pulmonary embolism, bilateral lobar History of PE 02/2020 CTA demonstrating bilateral acute pulmonary embolus involving lobar pulmonary arteries and distal on the right, segmental pulmonary arteries on the left with evidence of R heart strain (RV/LV Ratio 1.0). PESI Score Class V (Very High Risk). Management options including conservative management, lytic administration and thrombectomy discussed with decision for conservative management at present.  In the event patient decompensates, may consider catheter-directed thrombolysis. - Admit to ICU for close monitoring, given syncopal episode in ED - Therapeutic anticoagulation with heparin gtt - F/u Echo - F/u LE Dopplers -  Eventual transition to PO anticoagulation once nearing discharge  Atrial fibrillation History of Aflutter History of Afib s/p cardioversion 03/12/2020 and Aflutter s/p R-sided ablation 2019. On Eliquis and amiodarone at home, but noncompliant with these medications due to difficulty with self-medication administration at home. - Therapeutic anticoagulation with heparin gtt while in-house - Likely resumption of PO Eliquis at discharge - Continue amiodarone  Chronic biventricular heart failure History of chronic biventricular HF. Last Echo 02/2020 with EF 40-45%. - Continue Coreg - Hold Entresto for now, given relative hypotension - Hold Lasix, Aldactone for now  COPD - Continue Ellipta, Dulera (or equivalents) - Albuterol PRN  HLD - Continue Lipitor  Best Practice: (right click and "Reselect all SmartList Selections" daily)   Diet/type: Regular consistency (see orders) DVT prophylaxis: systemic heparin GI prophylaxis: N/A Lines: N/A Foley:  N/A Code Status:  full code Last date of multidisciplinary goals of care discussion [Pending] - patient states that in the event he becomes unable to make his own medical decisions, he wishes for his aunt, Driscilla Moats, to be his Education officer, environmental.  Labs:  CBC: Recent Labs  Lab 08/06/20 0754  WBC 11.8*  HGB 13.6  HCT 43.1  MCV 85.9  PLT 240    Basic Metabolic Panel: Recent Labs  Lab 08/06/20 0754 08/06/20 1414  NA 134*  --   K 3.7  --   CL 99  --   CO2 25  --   GLUCOSE 149*  --   BUN 8  --   CREATININE 1.21  --   CALCIUM 8.3*  --   MG  --  1.6*   GFR: CrCl cannot be calculated (Unknown ideal weight.). Recent Labs  Lab 08/06/20 0754 08/06/20 1158  WBC 11.8*  --   LATICACIDVEN  --  1.6   Liver Function Tests: No results for input(s): AST, ALT, ALKPHOS, BILITOT, PROT, ALBUMIN in the last 168 hours. No results for input(s): LIPASE, AMYLASE in the last 168 hours. No results for input(s): AMMONIA in the last 168  hours.  ABG    Component Value Date/Time   TCO2 26 09/13/2008 2313    Coagulation Profile: No results for input(s): INR, PROTIME in the last 168 hours.  Cardiac Enzymes: No results for input(s): CKTOTAL, CKMB, CKMBINDEX, TROPONINI in the last 168 hours.  HbA1C: Hgb A1c MFr Bld  Date/Time Value Ref Range Status  08/06/2020 02:17 PM 7.2 (H) 4.8 - 5.6 % Final    Comment:    (NOTE) Pre diabetes:          5.7%-6.4%  Diabetes:              >6.4%  Glycemic control for   <7.0% adults with diabetes   03/10/2020 01:05 AM 7.4 (H) 4.8 - 5.6 % Final    Comment:    (NOTE) Pre diabetes:          5.7%-6.4%  Diabetes:              >6.4%  Glycemic control for   <7.0% adults with diabetes    CBG: No results for input(s): GLUCAP in the last 168 hours.  Review of Systems:   Review of systems completed with pertinent positives/negatives outlined in above HPI.   Past Medical History:  He,  has a past  medical history of Arrhythmia, Atrial fibrillation (HCC), CHF (congestive heart failure) (HCC), COPD (chronic obstructive pulmonary disease) (HCC), Hypertension, Sleep apnea, and Stroke (HCC).   Surgical History:   Past Surgical History:  Procedure Laterality Date   ABLATION     CARDIOVERSION N/A 03/12/2020   Procedure: CARDIOVERSION;  Surgeon: Chilton Si, MD;  Location: [facility] ENDOSCOPY;  Service: Cardiovascular;  Laterality: N/A;   INCISION AND DRAINAGE PERIRECTAL ABSCESS N/A 10/20/2012   Procedure: IRRIGATION AND DEBRIDEMENT PERIRECTAL ABSCESS;  Surgeon: Wilmon Arms. Corliss Skains, MD;  Location: [facility] OR;  Service: General;  Laterality: N/A;  Lithotomy   TEE WITHOUT CARDIOVERSION N/A 03/12/2020   Procedure: TRANSESOPHAGEAL ECHOCARDIOGRAM (TEE);  Surgeon: Chilton Si, MD;  Location: [facility] ENDOSCOPY;  Service: Cardiovascular;  Laterality: N/A;    Social History:   reports that he quit smoking about 4 years ago. He smoked an average of 0.50 packs per day. He has never used smokeless tobacco.  He reports previous alcohol use. He reports previous drug use. Drug: Cocaine.   Family History:  His family history includes Breast cancer in his mother; Heart attack (age of onset: 60) in his father; Heart attack (age of onset: 35) in his paternal uncle.   Allergies: Allergies  Allergen Reactions   Lisinopril Cough    Home Medications: Prior to Admission medications   Medication Sig Start Date End Date Taking? Authorizing Provider  albuterol (PROVENTIL HFA;VENTOLIN HFA) 108 (90 Base) MCG/ACT inhaler Inhale 2 puffs into the lungs every 4 (four) hours as needed for shortness of breath. 02/07/16 08/26/23 Yes [provider]  albuterol (PROVENTIL) (2.5 MG/3ML) 0.083% nebulizer solution Take 3 mLs (2.5 mg total) by nebulization every 4 (four) hours as needed for wheezing or shortness of breath. 10/07/15  Yes Rolan Bucco, MD  amiodarone (PACERONE) 200 MG tablet Take 1 tablet (200 mg total) by mouth daily. 03/12/20  Yes Danford, Earl Lites, MD  apixaban (ELIQUIS) 5 MG TABS tablet Take 1 tablet (5 mg total) by mouth 2 (two) times daily. 03/18/20  Yes Danford, Earl Lites, MD  aspirin 81 MG chewable tablet Chew 81 mg by mouth daily.   Yes [provider]  atorvastatin (LIPITOR) 20 MG tablet Take 20 mg by mouth daily. 01/30/20  Yes [provider]  budesonide-formoterol (SYMBICORT) 80-4.5 MCG/ACT inhaler Inhale 2 puffs into the lungs in the morning and at bedtime.   Yes [provider]  carvedilol (COREG) 12.5 MG tablet Take 1 tablet (12.5 mg total) by mouth 2 (two) times daily with a meal. 03/12/20  Yes Danford, Earl Lites, MD  fluticasone furoate-vilanterol (BREO ELLIPTA) 100-25 MCG/INH AEPB Inhale 1 puff into the lungs daily.   Yes [provider]  furosemide (LASIX) 40 MG tablet Take 0.5 tablets (20 mg total) by mouth daily. 03/18/20  Yes Clegg, Amy D, NP  mometasone-formoterol (DULERA) 100-5 MCG/ACT AERO Inhale 1 puff into the lungs daily.   Yes  [provider]  sacubitril-valsartan (ENTRESTO) 24-26 MG Take 1 tablet by mouth 2 (two) times daily. 03/18/20  Yes Clegg, Amy D, NP  spironolactone (ALDACTONE) 25 MG tablet Take 1 tablet (25 mg total) by mouth daily. 03/18/20  Yes Clegg, Amy D, NP  apixaban (ELIQUIS) 5 MG TABS tablet TAKE 2 TABLETS (10MG  TOTAL) BY MOUTH TWICE DAILY FOR 6 MORE DAYS, THEN TAKE 1 TABLET (5MG  TOTAL) BY MOUTH TWICE DAILY Patient not taking: No sig reported 03/12/20 03/12/21  Danford, 03/14/20, MD  sacubitril-valsartan (ENTRESTO) 24-26 MG TAKE 1 TABLET BY MOUTH 2 (TWO)  TIMES DAILY. Patient not taking: No sig reported 03/18/20 03/18/21  Sherald Hess, NP    Critical care time: 40 minutes   Tim Lair, PA-C [facility] 08/06/20 5:03 PM  Please see [email or web address] for pager details.  From 7A-7P if no response, please call [phone] After hours, please call ELink [phone]

## 2020-08-06 NOTE — ED Notes (Signed)
Patient transported to CT 

## 2020-08-07 ENCOUNTER — Other Ambulatory Visit (HOSPITAL_COMMUNITY): Payer: Self-pay

## 2020-08-07 ENCOUNTER — Inpatient Hospital Stay (HOSPITAL_COMMUNITY): Payer: Medicaid Other

## 2020-08-07 DIAGNOSIS — I5023 Acute on chronic systolic (congestive) heart failure: Secondary | ICD-10-CM | POA: Diagnosis not present

## 2020-08-07 DIAGNOSIS — I4891 Unspecified atrial fibrillation: Secondary | ICD-10-CM | POA: Diagnosis not present

## 2020-08-07 DIAGNOSIS — I2609 Other pulmonary embolism with acute cor pulmonale: Secondary | ICD-10-CM | POA: Diagnosis not present

## 2020-08-07 DIAGNOSIS — I2699 Other pulmonary embolism without acute cor pulmonale: Secondary | ICD-10-CM

## 2020-08-07 LAB — CBC
HCT: 39.4 % (ref 39.0–52.0)
Hemoglobin: 12.7 g/dL — ABNORMAL LOW (ref 13.0–17.0)
MCH: 27.4 pg (ref 26.0–34.0)
MCHC: 32.2 g/dL (ref 30.0–36.0)
MCV: 84.9 fL (ref 80.0–100.0)
Platelets: 250 10*3/uL (ref 150–400)
RBC: 4.64 MIL/uL (ref 4.22–5.81)
RDW: 16 % — ABNORMAL HIGH (ref 11.5–15.5)
WBC: 10.2 10*3/uL (ref 4.0–10.5)
nRBC: 0 % (ref 0.0–0.2)

## 2020-08-07 LAB — ECHOCARDIOGRAM COMPLETE
AR max vel: 1.37 cm2
AV Peak grad: 27.2 mmHg
Ao pk vel: 2.61 m/s
Area-P 1/2: 4.79 cm2
Calc EF: 31.8 %
Height: 72 in
MV M vel: 3.83 m/s
MV Peak grad: 58.7 mmHg
Radius: 0.3 cm
S' Lateral: 4.5 cm
Single Plane A2C EF: 26.3 %
Single Plane A4C EF: 34.2 %
Weight: 4701.97 oz

## 2020-08-07 LAB — HEPARIN LEVEL (UNFRACTIONATED): Heparin Unfractionated: 0.43 IU/mL (ref 0.30–0.70)

## 2020-08-07 LAB — BRAIN NATRIURETIC PEPTIDE: B Natriuretic Peptide: 650.8 pg/mL — ABNORMAL HIGH (ref 0.0–100.0)

## 2020-08-07 LAB — BASIC METABOLIC PANEL
Anion gap: 11 (ref 5–15)
BUN: 13 mg/dL (ref 6–20)
CO2: 22 mmol/L (ref 22–32)
Calcium: 8.3 mg/dL — ABNORMAL LOW (ref 8.9–10.3)
Chloride: 98 mmol/L (ref 98–111)
Creatinine, Ser: 1.09 mg/dL (ref 0.61–1.24)
GFR, Estimated: >60 mL/min (ref >60)
Glucose, Bld: 212 mg/dL — ABNORMAL HIGH (ref 70–99)
Potassium: 3.8 mmol/L (ref 3.5–5.1)
Sodium: 131 mmol/L — ABNORMAL LOW (ref 135–145)

## 2020-08-07 LAB — TROPONIN I (HIGH SENSITIVITY): Troponin I (High Sensitivity): 21 ng/L — ABNORMAL HIGH (ref <18)

## 2020-08-07 LAB — MAGNESIUM: Magnesium: 1.9 mg/dL (ref 1.7–2.4)

## 2020-08-07 LAB — APTT: aPTT: 48 seconds — ABNORMAL HIGH (ref 24–36)

## 2020-08-07 LAB — GLUCOSE, CAPILLARY
Glucose-Capillary: 137 mg/dL — ABNORMAL HIGH (ref 70–99)
Glucose-Capillary: 177 mg/dL — ABNORMAL HIGH (ref 70–99)
Glucose-Capillary: 174 mg/dL — ABNORMAL HIGH (ref 70–99)
Glucose-Capillary: 148 mg/dL — ABNORMAL HIGH (ref 70–99)

## 2020-08-07 LAB — PHOSPHORUS: Phosphorus: 3 mg/dL (ref 2.5–4.6)

## 2020-08-07 MED ORDER — HEPARIN BOLUS VIA INFUSION
3000.0000 [IU] | Freq: Once | INTRAVENOUS | Status: AC
  Administered 2020-08-07: 3000 [IU] via INTRAVENOUS
  Filled 2020-08-07: qty 3000

## 2020-08-07 MED ORDER — APIXABAN 5 MG PO TABS
10.0000 mg | ORAL_TABLET | Freq: Two times a day (BID) | ORAL | Status: DC
  Administered 2020-08-07 – 2020-08-08 (×3): 10 mg via ORAL
  Filled 2020-08-07 (×3): qty 2

## 2020-08-07 MED ORDER — POTASSIUM CHLORIDE CRYS ER 20 MEQ PO TBCR
40.0000 meq | EXTENDED_RELEASE_TABLET | Freq: Two times a day (BID) | ORAL | Status: AC
  Administered 2020-08-07 (×2): 40 meq via ORAL
  Filled 2020-08-07 (×2): qty 2

## 2020-08-07 MED ORDER — FUROSEMIDE 10 MG/ML IJ SOLN
60.0000 mg | Freq: Two times a day (BID) | INTRAMUSCULAR | Status: AC
  Administered 2020-08-07 (×2): 60 mg via INTRAVENOUS
  Filled 2020-08-07 (×2): qty 6

## 2020-08-07 MED ORDER — APIXABAN 5 MG PO TABS: 5.0000 mg | ORAL_TABLET | Freq: Two times a day (BID) | ORAL | Status: DC

## 2020-08-07 NOTE — Progress Notes (Signed)
ANTICOAGULATION CONSULT NOTE - Follow Up Consult  Pharmacy Consult for heparin Indication: atrial fibrillation and pulmonary embolus  Labs: Recent Labs    08/06/20 0754 08/06/20 1102 08/06/20 1812 08/07/20 0423 08/07/20 0440 08/07/20 0441  HGB 13.6  --   --  12.7*  --   --   HCT 43.1  --   --  39.4  --   --   PLT 240  --   --  250  --   --   APTT  --   --  34  --   --  48*  HEPARINUNFRC  --   --   --   --  0.43  --   CREATININE 1.21  --   --  1.09  --   --   TROPONINIHS 20* 21*  --   --   --   --     Assessment: 60yo male subtherapeutic on heparin with initial dosing for acute PE despite Eliquis PTA for Afib (possible noncompliance); no gtt issues or signs of bleeding per RN.  Goal of Therapy:  aPTT 66-102 seconds   Plan:  Will rebolus with heparin 3000 units and increase heparin gtt by 4 units/kgABW/hr to 2300 units/hr and check level in 6 hours.    Vernard Gambles, PharmD, BCPS  08/07/2020,5:19 AM

## 2020-08-07 NOTE — Progress Notes (Addendum)
Inpatient Diabetes Program Recommendations  AACE/ADA: New Consensus Statement on Inpatient Glycemic Control   Target Ranges:  Prepandial:   less than 140 mg/dL      Peak postprandial:   less than 180 mg/dL (1-2 hours)      Critically ill patients:  140 - 180 mg/dL  Results for Donald [name], Donald [name] (MRN [id number]) as of 08/07/2020 10:23  Ref. Range 08/06/2020 17:09 08/06/2020 21:04 08/07/2020 06:16  Glucose-Capillary Latest Ref Range: 70 - 99 mg/dL 130 (H) 174 (H) 137 (H)    Results for Donald [name], Donald [name] (MRN [id number]) as of 08/07/2020 10:23  Ref. Range 10/21/2012 11:05 11/16/2014 08:12 03/10/2020 01:05 08/06/2020 14:17  Hemoglobin A1C Latest Ref Range: 4.8 - 5.6 % 6.1 (H) 6.1 (H) 7.4 (H) 7.2 (H)   Review of Glycemic Control  Diabetes history: DM2 (dx in February 2022 during last hospital admission) Outpatient Diabetes medications: None Current orders for Inpatient glycemic control: Novolog 0-9 units TID with meals  Inpatient Diabetes Program Recommendations:    HbgA1C: Current A1C 7.2% on 08/06/20. Noted A1C of 7.4% on 03/10/20. Patient was inpatient 03/08/20-03/12/20 and newly dx with DM at that time. Patient seen by inpatient diabetes coordinator on 03/11/20 regarding new DM dx.    Outpatient: Would recommend prescribing glucose monitoring kit (#[id number]) and oral DM medication as outpatient.  Addendum 08/07/20_0 :10-Spoke with patient and his aunt at bedside. Patient is sitting up in chair with CPAP on but able to talk with mask on.  Asked patient if he remembered getting dx with DM in February during last hospital admission and patient stated "Yes, I did."  Patient states that he recalls talking to diabetes coordinator during last hospitalization but does not recall information discussed about new DM dx. Patient states that he has not followed up with PCP since he was discharged on 03/12/20 and he states that he has not made any dietary changes since then either. Patient reports that he drinks lots of  soda and juice each day and he eats high carbohydrate meals. Discussed A1C results (7.2% on 08/06/20) and explained what an A1C is and informed patient that his current A1C indicates an average glucose of 160 mg/dl over the past 2-3 months. Explained that prior A1C was 7.4% on 03/10/20.  Discussed basic pathophysiology of DM Type 2, basic home care, importance of checking CBGs and maintaining good CBG control to prevent long-term and short-term complications. Reviewed glucose and A1C goals.  Discussed impact of nutrition, exercise, stress, sickness, and medications on diabetes control.  Discussed carb modified diet and informed patient that RD would be consulted for further diet education. Explained to patient that he will likely be asked to start checking his glucose at least once a day at home and he may need to take oral DM medication as an outpatient. Patient agreeable to take DM medication outpatient if prescribed. Asked patient to get a follow up visit set up with his PCP for follow up regarding DM management. Patient verbalized understanding of information discussed and he states that he has no further questions at this time related to diabetes.   RNs to provide ongoing basic DM education at bedside with this patient and engage patient to actively check blood glucose.   Thanks, Barnie Alderman, RN, MSN, CDE Diabetes Coordinator Inpatient Diabetes Program [phone] (Team Pager from 8am to 5pm)

## 2020-08-07 NOTE — Discharge Instructions (Addendum)
Information on my medicine - ELIQUIS (apixaban)  Why was Eliquis prescribed for you? Eliquis was prescribed to treat blood clots that may have been found in the veins of your legs (deep vein thrombosis) or in your lungs (pulmonary embolism) and to reduce the risk of them occurring again.  What do You need to know about Eliquis ? The starting dose is 10 mg (two 5 mg tablets) taken TWICE daily for the FIRST SEVEN (7) DAYS, then on 08/18/20 the dose is reduced to ONE 5 mg tablet taken TWICE daily.  Eliquis may be taken with or without food.   Try to take the dose about the same time in the morning and in the evening. If you have difficulty swallowing the tablet whole please discuss with your pharmacist how to take the medication safely.  Take Eliquis exactly as prescribed and DO NOT stop taking Eliquis without talking to the doctor who prescribed the medication.  Stopping may increase your risk of developing a new blood clot.  Refill your prescription before you run out.  After discharge, you should have regular check-up appointments with your healthcare provider that is prescribing your Eliquis.    What do you do if you miss a dose? If a dose of ELIQUIS is not taken at the scheduled time, take it as soon as possible on the same day and twice-daily administration should be resumed. The dose should not be doubled to make up for a missed dose.  Important Safety Information A possible side effect of Eliquis is bleeding. You should call your healthcare provider right away if you experience any of the following: Bleeding from an injury or your nose that does not stop. Unusual colored urine (red or dark brown) or unusual colored stools (red or black). Unusual bruising for unknown reasons. A serious fall or if you hit your head (even if there is no bleeding).  Some medicines may interact with Eliquis and might increase your risk of bleeding or clotting while on Eliquis. To help avoid this,  consult your healthcare provider or pharmacist prior to using any new prescription or non-prescription medications, including herbals, vitamins, non-steroidal anti-inflammatory drugs (NSAIDs) and supplements.  This website has more information on Eliquis (apixaban): http://www.eliquis.com/eliquis/home  Carbohydrate Counting For People With Diabetes  Foods with carbohydrates make your blood glucose level go up. Learning how to count carbohydrates can help you control your blood glucose levels. First, identify the foods you eat that contain carbohydrates. Then, using the Foods with Carbohydrates chart, determine about how much carbohydrates are in your meals and snacks. Make sure you are eating foods with fiber, protein, and healthy fat along with your carbohydrate foods. Foods with Carbohydrates The following table shows carbohydrate foods that have about 15 grams of carbohydrate each. Using measuring cups, spoons, or a food scale when you first begin learning about carbohydrate counting can help you learn about the portion sizes you typically eat. The following foods have 15 grams carbohydrate each:  Grains 1 slice bread (1 ounce)  1 small tortilla (6-inch size)   large bagel (1 ounce)  1/3 cup pasta or rice (cooked)   hamburger or hot dog bun ( ounce)   cup cooked cereal   to  cup ready-to-eat cereal  2 taco shells (5-inch size) Fruit 1 small fresh fruit ( to 1 cup)   medium banana  17 small grapes (3 ounces)  1 cup melon or berries   cup canned or frozen fruit  2 tablespoons dried fruit (blueberries,  cherries, cranberries, raisins)   cup unsweetened fruit juice  Starchy Vegetables  cup cooked beans, peas, corn, potatoes/sweet potatoes   large baked potato (3 ounces)  1 cup acorn or butternut squash  Snack Foods 3 to 6 crackers  8 potato chips or 13 tortilla chips ( ounce to 1 ounce)  3 cups popped popcorn  Dairy 3/4 cup (6 ounces) nonfat plain yogurt, or yogurt with  sugar-free sweetener  1 cup milk  1 cup plain rice, soy, coconut or flavored almond milk Sweets and Desserts  cup ice cream or frozen yogurt  1 tablespoon jam, jelly, pancake syrup, table sugar, or honey  2 tablespoons light pancake syrup  1 inch square of frosted cake or 2 inch square of unfrosted cake  2 small cookies (2/3 ounce each) or  large cookie  Sometimes you'll have to estimate carbohydrate amounts if you don't know the exact recipe. One cup of mixed foods like soups can have 1 to 2 carbohydrate servings, while some casseroles might have 2 or more servings of carbohydrate. Foods that have less than 20 calories in each serving can be counted as "free" foods. Count 1 cup raw vegetables, or  cup cooked non-starchy vegetables as "free" foods. If you eat 3 or more servings at one meal, then count them as 1 carbohydrate serving.  Foods without Carbohydrates  Not all foods contain carbohydrates. Meat, some dairy, fats, non-starchy vegetables, and many beverages don't contain carbohydrate. So when you count carbohydrates, you can generally exclude chicken, pork, beef, fish, seafood, eggs, tofu, cheese, butter, sour cream, avocado, nuts, seeds, olives, mayonnaise, water, black coffee, unsweetened tea, and zero-calorie drinks. Vegetables with no or low carbohydrate include green beans, cauliflower, tomatoes, and onions. How much carbohydrate should I eat at each meal?  Carbohydrate counting can help you plan your meals and manage your weight. Following are some starting points for carbohydrate intake at each meal. Work with your registered dietitian nutritionist to find the best range that works for your blood glucose and weight.   To Lose Weight To Maintain Weight  Women 2 - 3 carb servings 3 - 4 carb servings  Men 3 - 4 carb servings 4 - 5 carb servings  Checking your blood glucose after meals will help you know if you need to adjust the timing, type, or number of carbohydrate servings in your  meal plan. Achieve and keep a healthy body weight by balancing your food intake and physical activity.  Tips How should I plan my meals?  Plan for half the food on your plate to include non-starchy vegetables, like salad greens, broccoli, or carrots. Try to eat 3 to 5 servings of non-starchy vegetables every day. Have a protein food at each meal. Protein foods include chicken, fish, meat, eggs, or beans (note that beans contain carbohydrate). These two food groups (non-starchy vegetables and proteins) are low in carbohydrate. If you fill up your plate with these foods, you will eat less carbohydrate but still fill up your stomach. Try to limit your carbohydrate portion to  of the plate.  What fats are healthiest to eat?  Diabetes increases risk for heart disease. To help protect your heart, eat more healthy fats, such as olive oil, nuts, and avocado. Eat less saturated fats like butter, cream, and high-fat meats, like bacon and sausage. Avoid trans fats, which are in all foods that list "partially hydrogenated oil" as an ingredient. What should I drink?  Choose drinks that are not sweetened with sugar.  The healthiest choices are water, carbonated or seltzer waters, and tea and coffee without added sugars.  Sweet drinks will make your blood glucose go up very quickly. One serving of soda or energy drink is  cup. It is best to drink these beverages only if your blood glucose is low.  Artificially sweetened, or diet drinks, typically do not increase your blood glucose if they have zero calories in them. Read labels of beverages, as some diet drinks do have carbohydrate and will raise your blood glucose. Label Reading Tips Read Nutrition Facts labels to find out how many grams of carbohydrate are in a food you want to eat. Don't forget: sometimes serving sizes on the label aren't the same as how much food you are going to eat, so you may need to calculate how much carbohydrate is in the food you are  serving yourself.   Carbohydrate Counting for People with Diabetes Sample 1-Day Menu  Breakfast  cup yogurt, low fat, low sugar (1 carbohydrate serving)   cup cereal, ready-to-eat, unsweetened (1 carbohydrate serving)  1 cup strawberries (1 carbohydrate serving)   cup almonds ( carbohydrate serving)  Lunch 1, 5 ounce can chunk light tuna  2 ounces cheese, low fat cheddar  6 whole wheat crackers (1 carbohydrate serving)  1 small apple (1 carbohydrate servings)   cup carrots ( carbohydrate serving)   cup snap peas  1 cup 1% milk (1 carbohydrate serving)   Evening Meal Stir fry made with: 3 ounces chicken  1 cup brown rice (3 carbohydrate servings)   cup broccoli ( carbohydrate serving)   cup green beans   cup onions  1 tablespoon olive oil  2 tablespoons teriyaki sauce ( carbohydrate serving)  Evening Snack 1 extra small banana (1 carbohydrate serving)  1 tablespoon peanut butter   Carbohydrate Counting for People with Diabetes Vegan Sample 1-Day Menu  Breakfast 1 cup cooked oatmeal (2 carbohydrate servings)   cup blueberries (1 carbohydrate serving)  2 tablespoons flaxseeds  1 cup soymilk fortified with calcium and vitamin D  1 cup coffee  Lunch 2 slices whole wheat bread (2 carbohydrate servings)   cup baked tofu   cup lettuce  2 slices tomato  2 slices avocado   cup baby carrots ( carbohydrate serving)  1 orange (1 carbohydrate serving)  1 cup soymilk fortified with calcium and vitamin D   Evening Meal Burrito made with: 1 6-inch corn tortilla (1 carbohydrate serving)  1 cup refried vegetarian beans (2 carbohydrate servings)   cup chopped tomatoes   cup lettuce   cup salsa  1/3 cup brown rice (1 carbohydrate serving)  1 tablespoon olive oil for rice   cup zucchini   Evening Snack 6 small whole grain crackers (1 carbohydrate serving)  2 apricots ( carbohydrate serving)   cup unsalted peanuts ( carbohydrate serving)    Carbohydrate Counting  for People with Diabetes Vegetarian (Lacto-Ovo) Sample 1-Day Menu  Breakfast 1 cup cooked oatmeal (2 carbohydrate servings)   cup blueberries (1 carbohydrate serving)  2 tablespoons flaxseeds  1 egg  1 cup 1% milk (1 carbohydrate serving)  1 cup coffee  Lunch 2 slices whole wheat bread (2 carbohydrate servings)  2 ounces low-fat cheese   cup lettuce  2 slices tomato  2 slices avocado   cup baby carrots ( carbohydrate serving)  1 orange (1 carbohydrate serving)  1 cup unsweetened tea  Evening Meal Burrito made with: 1 6-inch corn tortilla (1 carbohydrate serving)   cup  refried vegetarian beans (1 carbohydrate serving)   cup tomatoes   cup lettuce   cup salsa  1/3 cup brown rice (1 carbohydrate serving)  1 tablespoon olive oil for rice   cup zucchini  1 cup 1% milk (1 carbohydrate serving)  Evening Snack 6 small whole grain crackers (1 carbohydrate serving)  2 apricots ( carbohydrate serving)   cup unsalted peanuts ( carbohydrate serving)    Copyright 2020  Academy of [employer] Dietetics. All rights reserved.  Using Nutrition Labels: Carbohydrate  Serving Size  Look at the serving size. All the information on the label is based on this portion. Servings Per Container  The number of servings contained in the package. Guidelines for Carbohydrate  Look at the total grams of carbohydrate in the serving size.  1 carbohydrate choice = 15 grams of carbohydrate. Range of Carbohydrate Grams Per Choice  Carbohydrate Grams/Choice Carbohydrate Choices  6-10   11-20 1  21-25 1  26-35 2  36-40 2  41-50 3  51-55 3  56-65 4  66-70 4  71-80 5    Copyright 2020  Academy of [employer] and Dietetics. All rights reserved.

## 2020-08-07 NOTE — Progress Notes (Signed)
BLE venous duplex has been completed.   Results can be found under chart review under CV PROC. 08/07/2020 3:44 PM Jhalil Silvera RVT, RDMS

## 2020-08-07 NOTE — Progress Notes (Signed)
NAME:  Donald [name], MRN:  [id number], DOB:  05-Jul-1960, LOS: 1 ADMISSION DATE:  08/06/2020, CONSULTATION DATE:  08/06/2020 REFERRING MD:  Orland Mustard CHIEF COMPLAINT: Chest pain, pulmonary embolism   History of Present Illness:  60 year old man with PMHx significant for HTN, OSA (on CPAP), COPD, Afib (s/p cardioversion 03/12/2020) and Aflutter (s/p R-sided ablation 2019), PE (02/2020, on Eliquis), CVA (2018) who presented to [facility] ED 7/12 with chest discomfort, palpitations and LE swelling.  Patient reports that he began feeling poorly last night, but went to sleep. He woke around 0400 this AM with his CPAP machine and felt significant chest discomfort with palpitations. This was severe enough that it prompted him to seek medical attention. He presented to [facility] ED and while in ED had a brief syncopal episode; patient reports he "passed out" and doesn't remember what occurred, but quickly regained consciousness. Of note, patient does not smoke, he has not had recent travel or surgery, but has had a prior PE (02/2020).  In ED, vitals were notable for tachycardia to 110s, RR 24-30, SBP 90s-110s, O2 sat 93-98% on 4L . Labs demonstrated normal WBC/H&H/Plt count, normal BMP with Cr 1.21, Mg 1.6 (repleted), troponin 20, LA 1.6, baseline PCT 0.25. CXR demonstrated cardiomegaly and vascular congestion with small R pleural effusions and RLL airspace disease c/f PNA. CT PE Protocol demonstrated bilateral acute pulmonary emboli with R heart strain.  PCCM consulted for PE management recommendations with decision to admit to ICU for close monitoring.  Pertinent Medical History:   Past Medical History:  Diagnosis Date   Arrhythmia    A fib    Atrial fibrillation (HCC)    CHF (congestive heart failure) (HCC)    COPD (chronic obstructive pulmonary disease) (HCC)    Hypertension    Sleep apnea    Stroke (HCC)    Significant Hospital Events: Including procedures, antibiotic start and stop dates in addition  to other pertinent events   7/12 Presented to ED with syncope, CP, SOB; CT PE with bilateral PE lobar pulmonary arteries; CCM consult, admit to ICU   Interim History / Subjective:  No events, feels a bit better.  Objective:  Blood pressure 113/89, pulse (!) 105, temperature 98.6 F (37 C), temperature source Axillary, resp. rate (!) 24, height 6' (1.829 m), weight 133.3 kg, SpO2 94 %.        Intake/Output Summary (Last 24 hours) at 08/07/2020 0842 Last data filed at 08/07/2020 0700 Gross per 24 hour  Intake 1551.36 ml  Output 850 ml  Net 701.36 ml    Filed Weights   08/06/20 1735 08/07/20 0600  Weight: 133.8 kg 133.3 kg    Physical Examination: Constitutional: no distress, wearing CPAP  Eyes: EOMI, pupils equal Ears, nose, mouth, and throat: CPAP in place with good seal Cardiovascular: RRR, loud P2, ext warm Respiratory: clear, no wheezing or rhonci Gastrointestinal: Soft, BS Ext: pitting edema both ext up to thighs Skin: No rashes, normal turgor Neurologic: moves all 4 ext to command Psychiatric: RASS 0, poor insight  BUN/Cr okay BNP okay Pct neg   Resolved Hospital Problem List:    Assessment & Plan:  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 Nonresolved PE with medication noncompliance, COPD, OSA, left heart failure. Abnormal CT scan- R effusion, wedge shaped infiltrate probably all related to PE (infarct) Volume overloaded state of heart Afib/RVR OSA COPD Hx CVA  - Start IV lasix, replete k - Continue metoprolol - Probably needs to be  back on entresto - CPAP qHS, PRN nebs - DC abx - Needs to take NoAC as outpatient or he will die from his RV failure - Will ask CHF team to evaluate (have seen as OP) for diuresis, GDMT - Given overall stability, will transfer to progressive, appreciate TR[facility] taking over care starting 7/14   Myrla Halsted MD

## 2020-08-07 NOTE — Consult Note (Addendum)
Advanced Heart Failure Team Consult Note   Primary Physician: Renaye Rakers, MD PCP-Cardiologist: Dr Rudolpho Sevin at [facility]  Reason for Consultation: Acute on chronic right heart failure in the setting of PE  HPI:    Donald [name] is seen today for evaluation of acute on chronic right heart failure at the request of Dr. Levon Hedger.  The patient is a 60 y/o [place] American male with a medical history significant for chronic HFrEF (last EF 40 to 45 percent), atrial fibrillation/flutter s/p DCCV in 02/2020 and ablation in 2019 (noncompliant with Eliquis), hypertension, diabetes, OSA on CPAP, COPD and prior CVA who presented to the [facility] ED for evaluation of chest pain and shortness of breath "because of afib."  He reports the symptoms began on Monday and progressively worsened through Tuesday, prompting to seek medical attention.  He does report noncompliance with his medications, saying they are "confusing," even with the assistance of [employer].  He last took all of his medications on Monday.  In the ED, a CT PE was notable for bilateral PE with evidence of right heart strain (RV/LV ratio of 1) as well as a small to moderate right pleural effusion and RLL opacities c/f infection.  He was also notably tachycardic into the 110s, but otherwise hemodynamically stable.  Labs were significant for a creatinine of 1.09, a BNP >650 and a magnesium of 1.6. Troponins were negative. He did receive Rocephin and Zithromax x1, but both are now discontinued.  Echo on 03/08/2020:   1. Left ventricular ejection fraction, by estimation, is 40 to 45%. The  left ventricle has mildly decreased function. The left ventricle  demonstrates global hypokinesis. There is severe left ventricular  hypertrophy. Left ventricular diastolic function  could not be evaluated.   2. Right ventricular systolic function is mildly reduced. The right  ventricular size is normal. There is moderately elevated  pulmonary artery  systolic pressure. The estimated right ventricular systolic pressure is  46.8 mmHg.   3. Left atrial size was severely dilated.   4. Right atrial size was severely dilated.   5. The pericardial effusion is localized near the right atrium.   6. The mitral valve is abnormal. Mild to moderate mitral valve  regurgitation. Moderate mitral annular calcification.   7. The aortic valve is tricuspid. Aortic valve regurgitation is trivial.  Aortic regurgitation PHT measures 449 msec. Aortic valve mean gradient  measures 4.7 mmHg.   8. Aortic dilatation noted. There is mild dilatation of the aortic root,  measuring 40 mm.   9. The inferior vena cava is dilated in size with <50% respiratory  variability, suggesting right atrial pressure of 15 mmHg.   Review of Systems: [y] = yes, [ ]  = no  Negative unless reported in HPI   Home Medications Prior to Admission medications   Medication Sig Start Date End Date Taking? Authorizing Provider  albuterol (PROVENTIL HFA;VENTOLIN HFA) 108 (90 Base) MCG/ACT inhaler Inhale 2 puffs into the lungs every 4 (four) hours as needed for shortness of breath. 02/07/16 08/26/23 Yes [provider]  albuterol (PROVENTIL) (2.5 MG/3ML) 0.083% nebulizer solution Take 3 mLs (2.5 mg total) by nebulization every 4 (four) hours as needed for wheezing or shortness of breath. 10/07/15  Yes Rolan Bucco, MD  amiodarone (PACERONE) 200 MG tablet Take 1 tablet (200 mg total) by mouth daily. 03/12/20  Yes Danford, Earl Lites, MD  apixaban (ELIQUIS) 5 MG TABS tablet Take 1 tablet (5 mg total)  by mouth 2 (two) times daily. 03/18/20  Yes Danford, Earl Lites, MD  aspirin 81 MG chewable tablet Chew 81 mg by mouth daily.   Yes [provider]  atorvastatin (LIPITOR) 20 MG tablet Take 20 mg by mouth daily. 01/30/20  Yes [provider]  budesonide-formoterol (SYMBICORT) 80-4.5 MCG/ACT inhaler Inhale 2 puffs into the lungs in the morning and at  bedtime.   Yes [provider]  carvedilol (COREG) 12.5 MG tablet Take 1 tablet (12.5 mg total) by mouth 2 (two) times daily with a meal. 03/12/20  Yes Danford, Earl Lites, MD  fluticasone furoate-vilanterol (BREO ELLIPTA) 100-25 MCG/INH AEPB Inhale 1 puff into the lungs daily.   Yes [provider]  furosemide (LASIX) 40 MG tablet Take 0.5 tablets (20 mg total) by mouth daily. 03/18/20  Yes Clegg, Amy D, NP  mometasone-formoterol (DULERA) 100-5 MCG/ACT AERO Inhale 1 puff into the lungs daily.   Yes [provider]  sacubitril-valsartan (ENTRESTO) 24-26 MG Take 1 tablet by mouth 2 (two) times daily. 03/18/20  Yes Clegg, Amy D, NP  spironolactone (ALDACTONE) 25 MG tablet Take 1 tablet (25 mg total) by mouth daily. 03/18/20  Yes Clegg, Amy D, NP  apixaban (ELIQUIS) 5 MG TABS tablet TAKE 2 TABLETS (10MG  TOTAL) BY MOUTH TWICE DAILY FOR 6 MORE DAYS, THEN TAKE 1 TABLET (5MG  TOTAL) BY MOUTH TWICE DAILY Patient not taking: No sig reported 03/12/20 03/12/21  Danford, 03/14/20, MD  sacubitril-valsartan (ENTRESTO) 24-26 MG TAKE 1 TABLET BY MOUTH 2 (TWO) TIMES DAILY. Patient not taking: No sig reported 03/18/20 03/18/21  03/20/20 D, NP    Past Medical History: Past Medical History:  Diagnosis Date   Arrhythmia    A fib    Atrial fibrillation (HCC)    CHF (congestive heart failure) (HCC)    COPD (chronic obstructive pulmonary disease) (HCC)    Hypertension    Sleep apnea    Stroke [facility])     Past Surgical History: Past Surgical History:  Procedure Laterality Date   ABLATION     CARDIOVERSION N/A 03/12/2020   Procedure: CARDIOVERSION;  Surgeon: [facility], MD;  Location: [facility] ENDOSCOPY;  Service: Cardiovascular;  Laterality: N/A;   INCISION AND DRAINAGE PERIRECTAL ABSCESS N/A 10/20/2012   Procedure: IRRIGATION AND DEBRIDEMENT PERIRECTAL ABSCESS;  Surgeon: [facility]. 10/22/2012, MD;  Location: [facility] OR;  Service: General;  Laterality: N/A;  Lithotomy   TEE WITHOUT  CARDIOVERSION N/A 03/12/2020   Procedure: TRANSESOPHAGEAL ECHOCARDIOGRAM (TEE);  Surgeon: Corliss Skains, MD;  Location: [facility] ENDOSCOPY;  Service: Cardiovascular;  Laterality: N/A;    Family History: Family History  Problem Relation Age of Onset   Breast cancer Mother    Heart attack Father 37   Heart attack Paternal Uncle 25    Social History: Social History   Socioeconomic History   Marital status: Single    Spouse name: Not on file   Number of children: Not on file   Years of education: Not on file   Highest education level: Not on file  Occupational History   Not on file  Tobacco Use   Smoking status: Former    Packs/day: 0.50    Pack years: 0.00    Types: Cigarettes    Quit date: 2018    Years since quitting: 4.5   Smokeless tobacco: Never  Substance and Sexual Activity   Alcohol use: Not Currently   Drug use: Not Currently    Types: Cocaine   Sexual activity: Not Currently  Other Topics Concern   Not on file  Social History Narrative   Not on file   Social Determinants of Health   Financial Resource Strain: Medium Risk   Difficulty of Paying Living Expenses: Somewhat hard  Food Insecurity: No Food Insecurity   Worried About Running Out of Food in the Last Year: Never true   Ran Out of Food in the Last Year: Never true  Transportation Needs: No Transportation Needs   Lack of Transportation (Medical): No   Lack of Transportation (Non-Medical): No  Physical Activity: Inactive   Days of Exercise per Week: 0 days   Minutes of Exercise per Session: 0 min  Stress: Not on file  Social Connections: Not on file    Allergies:  Allergies  Allergen Reactions   Lisinopril Cough    Objective:    Vital Signs:   Temp:  [97.8 F (36.6 C)-98.6 F (37 C)] 98.6 F (37 C) (07/13 0733) Pulse Rate:  [33-127] 102 (07/13 1000) Resp:  [14-36] 25 (07/13 1000) BP: (74-158)/(54-138) 120/89 (07/13 1000) SpO2:  [90 %-98 %] 94 % (07/13 1000) Weight:  [133.3 kg-133.8  kg] 133.3 kg (07/13 0600) Last BM Date: 08/06/20  Weight change: Filed Weights   08/06/20 1735 08/07/20 0600  Weight: 133.8 kg 133.3 kg    Intake/Output:   Intake/Output Summary (Last 24 hours) at 08/07/2020 1027 Last data filed at 08/07/2020 1000 Gross per 24 hour  Intake 1851.46 ml  Output 850 ml  Net 1001.46 ml      Physical Exam    General:  Sitting up in bed in no acute distress on CPAP  HEENT: normal Neck: supple. Mild JVD. Carotids 2+ bilat; no bruits. No lymphadenopathy or thyromegaly appreciated. Cor: PMI nondisplaced. Irregularly irregular rhythm. S1/S2, trace edema to BLE Lungs: diminished on auscultation  Abdomen: soft, nontender, distended. No hepatosplenomegaly. No bruits or masses. Good bowel sounds. Extremities: no cyanosis, clubbing, rash. Trace edema to BLE  Neuro: alert & orientedx3, cranial nerves grossly intact. moves all 4 extremities w/o difficulty. Affect pleasant   Telemetry   Atrial fibrillation in 100s.  Personally reviewed   EKG    Atrial fibrillation/flutter with LVH   Labs   Basic Metabolic Panel: Recent Labs  Lab 08/06/20 0754 08/06/20 1414 08/07/20 0423  NA 134*  --  131*  K 3.7  --  3.8  CL 99  --  98  CO2 25  --  22  GLUCOSE 149*  --  212*  BUN 8  --  13  CREATININE 1.21  --  1.09  CALCIUM 8.3*  --  8.3*  MG  --  1.6* 1.9  PHOS  --   --  3.0    Liver Function Tests: No results for input(s): AST, ALT, ALKPHOS, BILITOT, PROT, ALBUMIN in the last 168 hours. No results for input(s): LIPASE, AMYLASE in the last 168 hours. No results for input(s): AMMONIA in the last 168 hours.  CBC: Recent Labs  Lab 08/06/20 0754 08/07/20 0423  WBC 11.8* 10.2  HGB 13.6 12.7*  HCT 43.1 39.4  MCV 85.9 84.9  PLT 240 250    Cardiac Enzymes: No results for input(s): CKTOTAL, CKMB, CKMBINDEX, TROPONINI in the last 168 hours.  BNP: BNP (last 3 results) Recent Labs    03/18/20 1633 08/06/20 0754 08/07/20 0423  BNP 254.6* 442.4*  650.8*    ProBNP (last 3 results) No results for input(s): PROBNP in the last 8760 hours.   CBG: Recent Labs  Lab  08/06/20 1709 08/06/20 2104 08/07/20 0616  GLUCAP 130* 174* 137*    Coagulation Studies: No results for input(s): LABPROT, INR in the last 72 hours.   Imaging   CT Angio Chest PE W/Cm &/Or Wo Cm  Addendum Date: 08/06/2020   ADDENDUM REPORT: 08/06/2020 15:55 ADDENDUM: The original report was by Dr. Gaylyn Rong. The following addendum is by Dr. Gaylyn Rong: Critical Value/emergent results were called by telephone at the time of interpretation on 08/06/2020 at 3:53 pm to provider Dr. Orland Mustard, who verbally acknowledged these results. Electronically Signed   By: Gaylyn Rong M.D.   On: 08/06/2020 15:55   Result Date: 08/06/2020 CLINICAL DATA:  Chest pain and shortness of breath EXAM: CT ANGIOGRAPHY CHEST WITH CONTRAST TECHNIQUE: Multidetector CT imaging of the chest was performed using the standard protocol during bolus administration of intravenous contrast. Multiplanar CT image reconstructions and MIPs were obtained to evaluate the vascular anatomy. CONTRAST:  65mL OMNIPAQUE IOHEXOL 350 MG/ML SOLN COMPARISON:  03/08/2020 FINDINGS: Cardiovascular: Bilateral acute pulmonary embolus with involvement of right upper and lower lobe pulmonary arterial branches, right middle lobe pulmonary artery, left upper lobe, and to a lesser extent left lower lobe branches. On the left side the clot is primarily segmental where as on the right side there is involvement at the lobar levels. Clot burden is moderate to high especially in the right lower lobe. There is also potentially some peripheral thrombus in the right lower lobe which may indicate a chronic component. Mild cardiomegaly is present. Right ventricular left ventricular ratio is 1.0. Reflux of contrast in the hepatic veins. Coronary, aortic arch, and branch vessel atherosclerotic vascular disease. Mediastinum/Nodes:  Scattered mostly small mediastinal lymph nodes are noted although there is a right lower paratracheal node measuring 1.5 cm in short axis on image 62 series 5, previously the same. Borderline enlarged right infrahilar adenopathy. Additional scattered smaller mediastinal lymph nodes. Lungs/Pleura: Small to moderate right pleural effusion with passive atelectasis. Hazy ground-glass opacity and consolidative opacity in the right lower lobe roughly similar to prior. Right middle lobe airspace opacity posteriorly could represent pulmonary hemorrhage and is new compared to prior. Upper Abdomen: Unremarkable Musculoskeletal: Degenerative glenohumeral arthropathy bilaterally. Old left third and fourth rib deformities from old healed fractures. Thoracic spondylosis. Review of the MIP images confirms the above findings. IMPRESSION: 1. Bilateral pulmonary embolus involving lobar pulmonary arteries and distal on the right, and segmental pulmonary arteries on the left. Positive for acute PE with CT evidence of right heart strain (RV/LV Ratio = 1.0) consistent with at least submassive (intermediate risk) PE. The presence of right heart strain has been associated with an increased risk of morbidity and mortality. Please refer to the "PE Focused" order set in EPIC. 2. Stable scattered mediastinal lymph nodes, some of these are enlarged including a right lower paratracheal lymph node measuring 1.5 cm in diameter. This could be inflammatory although low-grade malignancy cannot be readily excluded. 3. Small to moderate right pleural effusion with passive atelectasis. Similar ground-glass and airspace opacities in the right lower lobe. New hazy airspace opacity posteriorly in the right middle lobe probably from pulmonary hemorrhage, less likely pneumonia. 4. Aortic Atherosclerosis (ICD10-I70.0). Coronary atherosclerosis with mild cardiomegaly. Radiology assistant personnel have been notified to put me in telephone contact with the  referring physician or the referring physician's clinical representative in order to discuss these findings. Once this communication is established I will issue an addendum to this report for documentation purposes. Electronically Signed: By: Gaylyn Rong  M.D. On: 08/06/2020 15:49     Medications:     Current Medications:  amiodarone  200 mg Oral Daily   apixaban  10 mg Oral BID   Followed by   Melene Muller ON 08/14/2020] apixaban  5 mg Oral BID   Chlorhexidine Gluconate Cloth  6 each Topical Daily   furosemide  60 mg Intravenous Q12H   guaiFENesin  600 mg Oral BID   insulin aspart  0-9 Units Subcutaneous TID WC   metoprolol tartrate  50 mg Oral BID   potassium chloride  40 mEq Oral BID   sodium chloride flush  3 mL Intravenous Q12H    Infusions:  sodium chloride        Patient Profile  The patient is a 60 y/o [place] American male with a medical history significant for chronic HFrEF (last EF 40 to 45 percent), atrial fibrillation/flutter s/p DCCV in 02/2020 and ablation in 2019 (nomcompliant with Eliquis), hypertension, OSA on CPAP, COPD and prior CVA who presented to the [facility] ED for evaluation of chest pain and shortness of breath "because of afib."  Imaging found bilateral PE with evidence of right heart strain.   Assessment/Plan   Acute pulmonary emboli - CTPE c/w bilateral PE and concern for right heart strain - Noted noncompliance with outpatient Eliquis -- resume and address compliance issues.  Consider reaching out to community paramedic medicine for assistance  - Management per primary team  - Previously diagnosed with bilateral PE in February   Acute on chronic heart failure - Secondary to acute PE and rapid atrial fibrillation, both triggered by medication noncompliance  - Last echo in 02/2020 showed an EF of 40 to 45 percent with mildly reduced RV systolic function and a moderately elevated PASP of 46.8 mmHg. Repeat pending  - s/p 60 mg of IV Lasix this AM with  subsequent 850 mL of urine in approximately 1 hour - Continue IV Lasix for now and repeat CXR in AM - Await echo findings, but suspect will resume Entresto. Again, will need to address compliance issues  - Continue beta blocker (currently Lopressor for rate control, but Coreg as outpatient) and spironolactone  - Echo to guide further recommendations   Atrial fibrillation with RVR  - Rate improved to low 100s  - s/p DCCV in Feb 2022 and ablation in 2019  - Currently hemodynamically stable -- if any acute change in stability, may need to consider DCCV  - continue Lopressor for now for optimization of heart rate, but eventually resume Coreg  - Resume Eliquis   Shortness of breath  COPD OSA  - Secondary to acute on chronic heart failure and rapid atrial fibrillation - Continue nebs as ordered by primary team. Consider resuming home Symbicort and Dulera  - CPAP at night -- patient endorses nightly compliance   Hypertension - BP currently borderline -- monitor closely   Type 2 diabetes - SSI ordered by primary team   Medication concerns reviewed with patient and pharmacy team. Barriers identified: Noncompliance   Thank you for the opportunity to participate in the care of this patient.  We will follow along with you.   Length of Stay: 1  Peter Garter, NP  08/07/2020, 10:27 AM  Advanced Heart Failure Team Pager [phone] (M-F; 7a - 5p)  Please contact [facility] for night-coverage after hours (5p -7a ) and weekends on [email or web address]   Patient seen and examined with the above-signed Advanced Practice Provider and/or Housestaff. I personally reviewed laboratory data, imaging  studies and relevant notes. I independently examined the patient and formulated the important aspects of the plan. I have edited the note to reflect any of my changes or salient points. I have personally discussed the plan with the patient and/or family.  60 y/o male previously followed in [place].   H/o obesity, HTN,  atrial flutter s/p ablation 2019 and chronic systolic HF of unclear etiology (no record of cath in Care Everywhere and he cannot recall one)   In 2/22 admitted with acute PE and recurrent AF. EF 40-45%. Underwent DC-CV. Placed on Eliquis.   Admitted with recurrent HF and SOB. Found to have bilateral PE (was not taking Eliquis). Also in AF with RVR. Echo EF down to  30-35%. Hstrop negative. Lives alone. Has not been taking meds.   General:  Sitting up in bed No resp difficulty HEENT: normal Neck: supple. JVP to jaw Carotids 2+ bilat; no bruits. No lymphadenopathy or thryomegaly appreciated. Cor: PMI nondisplaced. Irregular tachy  Lungs: clear Abdomen: soft, nontender, nondistended. No hepatosplenomegaly. No bruits or masses. Good bowel sounds. Extremities: no cyanosis, clubbing, rash, 2-3+ edema Neuro: alert & orientedx3, cranial nerves grossly intact. moves all 4 extremities w/o difficulty. Affect pleasant  Multiple issues. 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 Needs diuresis. Would favor R/L cath this admit followed by repeat TEE/DC-CV with amio support. Will need w/u for OSA.    Has acute on chronic PE. LE dopplers negative. Can switch Eliquis to heparin for now. Given medication compliace issues may be better off Xarelto than Eliquis at d/c.   He seems to have significant social challenges understanding his medical conditions and caring for himself. Will need SW and Paramedicine evals.   Arvilla Meresaniel Mikelle Myrick, MD

## 2020-08-07 NOTE — Progress Notes (Signed)
  Echocardiogram Echocardiogram Transesophageal has been performed.  Janalyn Harder 08/07/2020, 9:53 AM

## 2020-08-07 NOTE — TOC Benefit Eligibility Note (Addendum)
Patient Product/process development scientist completed.    The patient is currently admitted and upon discharge could be taking Eliquis 5 mg.  The current 30 day co-pay is, $4.00.   The patient is currently admitted and upon discharge could be taking Farxiga 10 mg.  Requires Prior Authorization  The patient is currently admitted and upon discharge could be taking Jardiance 10 mg.  Requires Prior Authorization  The patient is insured through [facility]     [name], CPhT Pharmacy Patient Advocate Specialist [facility] Antimicrobial Stewardship Team Direct Number: [phone]  Fax: [phone]

## 2020-08-08 ENCOUNTER — Other Ambulatory Visit (HOSPITAL_COMMUNITY): Payer: Self-pay

## 2020-08-08 DIAGNOSIS — Z9989 Dependence on other enabling machines and devices: Secondary | ICD-10-CM

## 2020-08-08 DIAGNOSIS — I10 Essential (primary) hypertension: Secondary | ICD-10-CM | POA: Diagnosis not present

## 2020-08-08 DIAGNOSIS — I4891 Unspecified atrial fibrillation: Secondary | ICD-10-CM | POA: Diagnosis not present

## 2020-08-08 DIAGNOSIS — J449 Chronic obstructive pulmonary disease, unspecified: Secondary | ICD-10-CM

## 2020-08-08 DIAGNOSIS — E119 Type 2 diabetes mellitus without complications: Secondary | ICD-10-CM

## 2020-08-08 DIAGNOSIS — G4733 Obstructive sleep apnea (adult) (pediatric): Secondary | ICD-10-CM | POA: Diagnosis not present

## 2020-08-08 LAB — BASIC METABOLIC PANEL
Anion gap: 11 (ref 5–15)
BUN: 16 mg/dL (ref 6–20)
CO2: 24 mmol/L (ref 22–32)
Calcium: 8.8 mg/dL — ABNORMAL LOW (ref 8.9–10.3)
Chloride: 97 mmol/L — ABNORMAL LOW (ref 98–111)
Creatinine, Ser: 1.18 mg/dL (ref 0.61–1.24)
GFR, Estimated: >60 mL/min (ref >60)
Glucose, Bld: 143 mg/dL — ABNORMAL HIGH (ref 70–99)
Potassium: 4.4 mmol/L (ref 3.5–5.1)
Sodium: 132 mmol/L — ABNORMAL LOW (ref 135–145)

## 2020-08-08 LAB — CBC
HCT: 40.4 % (ref 39.0–52.0)
Hemoglobin: 12.7 g/dL — ABNORMAL LOW (ref 13.0–17.0)
MCH: 26.8 pg (ref 26.0–34.0)
MCHC: 31.4 g/dL (ref 30.0–36.0)
MCV: 85.2 fL (ref 80.0–100.0)
Platelets: 258 10*3/uL (ref 150–400)
RBC: 4.74 MIL/uL (ref 4.22–5.81)
RDW: 15.9 % — ABNORMAL HIGH (ref 11.5–15.5)
WBC: 11.5 10*3/uL — ABNORMAL HIGH (ref 4.0–10.5)
nRBC: 0 % (ref 0.0–0.2)

## 2020-08-08 LAB — PHOSPHORUS: Phosphorus: 2.6 mg/dL (ref 2.5–4.6)

## 2020-08-08 LAB — GLUCOSE, CAPILLARY
Glucose-Capillary: 150 mg/dL — ABNORMAL HIGH (ref 70–99)
Glucose-Capillary: 180 mg/dL — ABNORMAL HIGH (ref 70–99)
Glucose-Capillary: 185 mg/dL — ABNORMAL HIGH (ref 70–99)
Glucose-Capillary: 149 mg/dL — ABNORMAL HIGH (ref 70–99)

## 2020-08-08 LAB — MAGNESIUM: Magnesium: 1.8 mg/dL (ref 1.7–2.4)

## 2020-08-08 MED ORDER — FUROSEMIDE 10 MG/ML IJ SOLN
80.0000 mg | Freq: Two times a day (BID) | INTRAMUSCULAR | Status: DC
  Administered 2020-08-08 – 2020-08-12 (×9): 80 mg via INTRAVENOUS
  Filled 2020-08-08 (×9): qty 8

## 2020-08-08 MED ORDER — GLUCERNA SHAKE PO LIQD
237.0000 mL | Freq: Three times a day (TID) | ORAL | Status: DC
  Administered 2020-08-08 – 2020-08-13 (×13): 237 mL via ORAL
  Filled 2020-08-08 (×2): qty 237

## 2020-08-08 MED ORDER — AMIODARONE HCL IN DEXTROSE 360-4.14 MG/200ML-% IV SOLN
30.0000 mg/h | INTRAVENOUS | Status: DC
  Administered 2020-08-08: 30 mg/h via INTRAVENOUS
  Administered 2020-08-09 (×2): 60 mg/h via INTRAVENOUS
  Administered 2020-08-09: 30 mg/h via INTRAVENOUS
  Administered 2020-08-10 – 2020-08-13 (×10): 60 mg/h via INTRAVENOUS
  Administered 2020-08-13: 30 mg/h via INTRAVENOUS
  Administered 2020-08-13: 60 mg/h via INTRAVENOUS
  Administered 2020-08-14: 30 mg/h via INTRAVENOUS
  Filled 2020-08-08 (×21): qty 200

## 2020-08-08 MED ORDER — AMIODARONE HCL IN DEXTROSE 360-4.14 MG/200ML-% IV SOLN
60.0000 mg/h | INTRAVENOUS | Status: AC
  Administered 2020-08-08: 60 mg/h via INTRAVENOUS
  Filled 2020-08-08: qty 200

## 2020-08-08 MED ORDER — MAGNESIUM SULFATE 2 GM/50ML IV SOLN
2.0000 g | Freq: Once | INTRAVENOUS | Status: AC
  Administered 2020-08-08: 2 g via INTRAVENOUS
  Filled 2020-08-08: qty 50

## 2020-08-08 MED ORDER — HEPARIN (PORCINE) 25000 UT/250ML-% IV SOLN
2900.0000 [IU]/h | INTRAVENOUS | Status: DC
  Administered 2020-08-08: 2300 [IU]/h via INTRAVENOUS
  Administered 2020-08-09: 2450 [IU]/h via INTRAVENOUS
  Administered 2020-08-09: 2600 [IU]/h via INTRAVENOUS
  Administered 2020-08-10 (×3): 2700 [IU]/h via INTRAVENOUS
  Administered 2020-08-11: 2800 [IU]/h via INTRAVENOUS
  Administered 2020-08-12: 2900 [IU]/h via INTRAVENOUS
  Filled 2020-08-08 (×9): qty 250

## 2020-08-08 MED ORDER — DIGOXIN 125 MCG PO TABS
0.1250 mg | ORAL_TABLET | Freq: Every day | ORAL | Status: DC
  Administered 2020-08-08 – 2020-08-15 (×8): 0.125 mg via ORAL
  Filled 2020-08-08 (×8): qty 1

## 2020-08-08 NOTE — TOC Benefit Eligibility Note (Signed)
Patient Advocate Encounter   Received notification that prior authorization for Farxiga 10 mg is required.   PA submitted on 08/08/2020 Key [id number] Status is pending       [name], CPhT Pharmacy Patient Advocate Specialist [facility] Antimicrobial Stewardship Team Direct Number: [phone]  Fax: [phone]

## 2020-08-08 NOTE — Plan of Care (Signed)
Nutrition Education Note  RD consulted for nutrition education regarding uncontrolled diabetes.   Lab Results  Component Value Date   HGBA1C 7.2 (H) 08/06/2020   RD provided "Carbohydrate Counting for People with Diabetes" handout from the Academy of Nutrition and Dietetics. Discussed different food groups and their effects on blood sugar, emphasizing carbohydrate-containing foods. Provided list of carbohydrates and recommended serving sizes of common foods.  Discussed importance of controlled and consistent carbohydrate intake throughout the day. Provided examples of ways to balance meals/snacks and encouraged intake of high-fiber, whole grain complex carbohydrates. Teach back method used.  Expect fair compliance.  Body mass index is 40.75 kg/m. Pt meets criteria for Obesity Class 3 based on current BMI.  Current diet order is Carb Modified, patient is consuming approximately 50-75% of meals at this time. Labs and medications reviewed. Pt requested Glucerna shakes - RD to order.  No further nutrition interventions warranted at this time. RD contact information provided. If additional nutrition issues arise, please re-consult RD.  Vertell Limber, RD, LDN (she/her/hers) Registered Dietitian I After-Hours/Weekend Pager # in [place]

## 2020-08-08 NOTE — Progress Notes (Signed)
Pt had 11 pound weight gain from admission weight 7/13. Weight taken standing with only gown and socks on. Pt does not report increased SOB and no increased edema noted upon assessment.

## 2020-08-08 NOTE — TOC Benefit Eligibility Note (Signed)
Patient Advocate Encounter  Prior Authorization for Farxiga 10 mg has been approved.    PA# [id number] Effective dates: 08/08/2020 through 08/08/2021  Patients co-pay is $4.00.     [name], CPhT Pharmacy Patient Advocate Specialist [place] Antimicrobial Stewardship Team Direct Number: [phone]  Fax: [phone]

## 2020-08-08 NOTE — Progress Notes (Addendum)
Advanced Heart Failure Rounding Note  PCP-Cardiologist: Dr Rudolpho Sevin   Subjective:   Missed HF follow x2 in March.   Had appointment at [facility] Cardiology team March 2022 and was back in Afib. He was set  up for cardioversion but this was later cancelled with plan for an ablation?   7/13 Admitted with chest pain and A fib RVR.  CTA with acute bilateral PE with RV strain, consistent with submassive PE. Started on eliquis 10 mg twice a day. Started on IV lasix. Negative 2.1 liters.   Frustrated today because his weight is up 12 pounds. SOB with exertion.   Cardiac Testing  A flutter  Ablation 2019  DC-CV 03/12/2020--->NSR  ECHO 03/08/2020 EF 40-45% RV mildly reduced, RA/LA severely dilated, mild-mod MV regurgitation  CTA 03/08/2020  Partially occlusive thrombus within segmental and subsegmental arterial branches within the bilateral lungs as described above. No evidence of right ventricular heart strain. Small right pleural effusion. Bilateral patchy ground-glass opacity seen within both lung bases right greater than left which could be due to atelectasis, or infectious etiology.  Nuclear Stress Test 2018  1. No reversible ischemia or infarction.  2. Mild generalized hypokinesis. No focal left ventricular wall  motion defect beyond mild generalized hypokinesis.  3. Left ventricular ejection fraction 43%    Objective:   Weight Range: (!) 136.3 kg Body mass index is 40.75 kg/m.   Vital Signs:   Temp:  [97.5 F (36.4 C)-99 F (37.2 C)] 97.5 F (36.4 C) (07/14 0353) Pulse Rate:  [87-108] 104 (07/14 0726) Resp:  [18-33] 18 (07/14 0726) BP: (96-120)/(51-96) 113/80 (07/14 0726) SpO2:  [87 %-99 %] 97 % (07/14 0726) Weight:  [131 kg-137.1 kg] 136.3 kg (07/14 0353) Last BM Date: 08/06/20  Weight change: Filed Weights   08/07/20 1557 08/08/20 0340 08/08/20 0353  Weight: 131 kg (!) 137.1 kg (!) 136.3 kg    Intake/Output:   Intake/Output Summary (Last 24 hours) at  08/08/2020 0909 Last data filed at 08/08/2020 0402 Gross per 24 hour  Intake 1020.1 ml  Output 3175 ml  Net -2154.9 ml      Physical Exam    General:  No resp difficulty HEENT: Normal Neck: Supple. JVP to jaw . Carotids 2+ bilat; no bruits. No lymphadenopathy or thyromegaly appreciated. Cor: PMI nondisplaced. Tachy/Irregular rate & rhythm. No rubs, gallops or murmurs. Lungs: Clear Abdomen: Soft, nontender, nondistended. No hepatosplenomegaly. No bruits or masses. Good bowel sounds. Extremities: No cyanosis, clubbing, rash, R and LLE 2+ edema Neuro: Alert & orientedx3, cranial nerves grossly intact. moves all 4 extremities w/o difficulty. Affect pleasant   Telemetry  A fib RVR 100s   EKG    N/A  Labs    CBC Recent Labs    08/07/20 0423 08/08/20 0414  WBC 10.2 11.5*  HGB 12.7* 12.7*  HCT 39.4 40.4  MCV 84.9 85.2  PLT 250 258   Basic Metabolic Panel Recent Labs    08/65/78 0423 08/08/20 0414  NA 131* 132*  K 3.8 4.4  CL 98 97*  CO2 22 24  GLUCOSE 212* 143*  BUN 13 16  CREATININE 1.09 1.18  CALCIUM 8.3* 8.8*  MG 1.9 1.8  PHOS 3.0 2.6   Liver Function Tests No results for input(s): AST, ALT, ALKPHOS, BILITOT, PROT, ALBUMIN in the last 72 hours. No results for input(s): LIPASE, AMYLASE in the last 72 hours. Cardiac Enzymes No results for input(s): CKTOTAL, CKMB, CKMBINDEX, TROPONINI in the last 72 hours.  BNP: BNP (last 3 results) Recent Labs    03/18/20 1633 08/06/20 0754 08/07/20 0423  BNP 254.6* 442.4* 650.8*    ProBNP (last 3 results) No results for input(s): PROBNP in the last 8760 hours.   D-Dimer No results for input(s): DDIMER in the last 72 hours. Hemoglobin A1C Recent Labs    08/06/20 1417  HGBA1C 7.2*   Fasting Lipid Panel No results for input(s): CHOL, HDL, LDLCALC, TRIG, CHOLHDL, LDLDIRECT in the last 72 hours. Thyroid Function Tests Recent Labs    08/06/20 1812  TSH 0.536    Other results:   Imaging     ECHOCARDIOGRAM COMPLETE  Result Date: 08/07/2020    ECHOCARDIOGRAM REPORT   Patient Name:   EL PILE Date of Exam: 08/07/2020 Medical Rec #:  [id number]       Height:       72.0 in Accession #:    [id number]      Weight:       293.9 lb Date of Birth:  1960-10-12        BSA:          2.509 m Patient Age:    60 years        BP:           113/89 mmHg Patient Gender: M               HR:           105 bpm. Exam Location:  Inpatient Procedure: 2D Echo, 3D Echo, Cardiac Doppler and Color Doppler Indications:    I50.40* Unspecified combined systolic (congestive) and diastolic                 (congestive) heart failure  History:        Patient has prior history of Echocardiogram examinations, most                 recent 03/12/2020. Abnormal ECG, COPD, Arrythmias:Atrial                 Fibrillation, Signs/Symptoms:Chest Pain; Risk Factors:Diabetes                 and Sleep Apnea. Pulmonary embolus.  Sonographer:    Sheralyn Boatman RDCS Referring Phys: [id number] ALLISON WOLFE IMPRESSIONS  1. Left ventricular ejection fraction, by estimation, is 30 to 35%. The left ventricle has moderately decreased function. The left ventricle demonstrates global hypokinesis. The left ventricular internal cavity size was mildly dilated. There is mild left ventricular hypertrophy. Left ventricular diastolic parameters are indeterminate.  2. Right ventricular systolic function is moderately reduced. The right ventricular size is moderately enlarged. There is moderately elevated pulmonary artery systolic pressure. The estimated right ventricular systolic pressure is 46.1 mmHg.  3. Left atrial size was moderately dilated.  4. Right atrial size was moderately dilated.  5. The mitral valve is normal in structure. Mild mitral valve regurgitation. No evidence of mitral stenosis.  6. The aortic valve is tricuspid. Aortic valve regurgitation is trivial. Mild to moderate aortic valve sclerosis/calcification is present, without any evidence of aortic  stenosis.  7. Aortic dilatation noted. There is mild dilatation of the ascending aorta, measuring 42 mm.  8. The inferior vena cava is dilated in size with <50% respiratory variability, suggesting right atrial pressure of 15 mmHg.  9. The patient is in atrial fibrillation. FINDINGS  Left Ventricle: Left ventricular ejection fraction, by estimation, is 30 to 35%. The left ventricle has moderately decreased function. The left  ventricle demonstrates global hypokinesis. The left ventricular internal cavity size was mildly dilated. There is mild left ventricular hypertrophy. Left ventricular diastolic parameters are indeterminate. Right Ventricle: The right ventricular size is moderately enlarged. No increase in right ventricular wall thickness. Right ventricular systolic function is moderately reduced. There is moderately elevated pulmonary artery systolic pressure. The tricuspid  regurgitant velocity is 2.79 m/s, and with an assumed right atrial pressure of 15 mmHg, the estimated right ventricular systolic pressure is 46.1 mmHg. Left Atrium: Left atrial size was moderately dilated. Right Atrium: Right atrial size was moderately dilated. Pericardium: Trivial pericardial effusion is present. Mitral Valve: The mitral valve is normal in structure. There is mild thickening of the mitral valve leaflet(s). Mild mitral annular calcification. Mild mitral valve regurgitation. No evidence of mitral valve stenosis. Tricuspid Valve: The tricuspid valve is normal in structure. Tricuspid valve regurgitation is mild. Aortic Valve: The aortic valve is tricuspid. Aortic valve regurgitation is trivial. Mild to moderate aortic valve sclerosis/calcification is present, without any evidence of aortic stenosis. Aortic valve peak gradient measures 27.2 mmHg. Pulmonic Valve: The pulmonic valve was normal in structure. Pulmonic valve regurgitation is not visualized. Aorta: The aortic root is normal in size and structure and aortic dilatation  noted. There is mild dilatation of the ascending aorta, measuring 42 mm. Venous: The inferior vena cava is dilated in size with less than 50% respiratory variability, suggesting right atrial pressure of 15 mmHg. IAS/Shunts: No atrial level shunt detected by color flow Doppler.  LEFT VENTRICLE PLAX 2D LVIDd:         5.60 cm LVIDs:         4.50 cm LV PW:         1.40 cm LV IVS:        1.20 cm LVOT diam:     2.20 cm LV SV:         51 LV SV Index:   20 LVOT Area:     3.80 cm  LV Volumes (MOD) LV vol d, MOD A2C: 137.0 ml LV vol d, MOD A4C: 105.0 ml LV vol s, MOD A2C: 101.0 ml LV vol s, MOD A4C: 69.1 ml LV SV MOD A2C:     36.0 ml LV SV MOD A4C:     105.0 ml LV SV MOD BP:      40.6 ml RIGHT VENTRICLE            IVC RV S prime:     7.51 cm/s  IVC diam: 3.90 cm TAPSE (M-mode): 0.7 cm LEFT ATRIUM             Index       RIGHT ATRIUM           Index LA diam:        5.50 cm 2.19 cm/m  RA Area:     25.00 cm LA Vol (A2C):   75.8 ml 30.21 ml/m RA Volume:   77.20 ml  30.77 ml/m LA Vol (A4C):   90.7 ml 36.15 ml/m LA Biplane Vol: 90.2 ml 35.95 ml/m  AORTIC VALVE AV Area (Vmax): 1.37 cm AV Vmax:        261.00 cm/s AV Peak Grad:   27.2 mmHg LVOT Vmax:      93.80 cm/s LVOT Vmean:     61.300 cm/s LVOT VTI:       0.135 m  AORTA Ao Root diam: 3.50 cm Ao Asc diam:  4.20 cm MITRAL VALVE  TRICUSPID VALVE MV Area (PHT): 4.79 cm      TR Peak grad:   31.1 mmHg MV Decel Time: 158 msec      TR Vmax:        279.00 cm/s MR Peak grad:    58.7 mmHg MR Mean grad:    32.0 mmHg   SHUNTS MR Vmax:         383.00 cm/s Systemic VTI:  0.14 m MR Vmean:        259.0 cm/s  Systemic Diam: 2.20 cm MR PISA:         0.57 cm MR PISA Eff ROA: 6 mm MR PISA Radius:  0.30 cm MV E velocity: 96.63 cm/s Marca Ancona MD Electronically signed by Marca Ancona MD Signature Date/Time: 08/07/2020/2:36:54 PM    Final    VAS [place] LOWER EXTREMITY VENOUS (DVT)  Result Date: 08/07/2020  Lower Venous DVT Study Patient Name:  Donald [name]  Date of Exam:    08/07/2020 Medical Rec #: [id number]        Accession #:    [id number] Date of Birth: 26-Jun-1960         Patient Gender: M Patient Age:   31Y Exam Location:  [facility] Procedure:      VAS [place] LOWER EXTREMITY VENOUS (DVT) Referring Phys: [id number] STEPHANIE M REESE --------------------------------------------------------------------------------  Indications: Pulmonary embolism.  Limitations: Body habitus, poor ultrasound/tissue interface and patient unable to tolerate compressions. Comparison Study: 03-08-2020 Prior bilateral lower extremity venous was negative                   for DVT.                    08-06-2020 CTA Chest positive for PE. Performing Technologist: Ernestene Mention RVT, RDMS  Examination Guidelines: A complete evaluation includes B-mode imaging, spectral Doppler, color Doppler, and power Doppler as needed of all accessible portions of each vessel. Bilateral testing is considered an integral part of a complete examination. Limited examinations for reoccurring indications may be performed as noted. The reflux portion of the exam is performed with the patient in reverse Trendelenburg.  +---------+---------------+---------+-----------+----------+--------------+ RIGHT    CompressibilityPhasicitySpontaneityPropertiesThrombus Aging +---------+---------------+---------+-----------+----------+--------------+ CFV      Full           No       Yes                                 +---------+---------------+---------+-----------+----------+--------------+ SFJ      Full                                                        +---------+---------------+---------+-----------+----------+--------------+ FV Prox  Full           No       Yes                                 +---------+---------------+---------+-----------+----------+--------------+ FV Mid   Full           No       Yes                                  +---------+---------------+---------+-----------+----------+--------------+  FV DistalFull           No       Yes                                 +---------+---------------+---------+-----------+----------+--------------+ PFV      Full                                                        +---------+---------------+---------+-----------+----------+--------------+ POP      Full           No       Yes                                 +---------+---------------+---------+-----------+----------+--------------+ PTV      Full                                                        +---------+---------------+---------+-----------+----------+--------------+ PERO     Full                                                        +---------+---------------+---------+-----------+----------+--------------+   +---------+---------------+---------+-----------+-------------+--------------+ LEFT     CompressibilityPhasicitySpontaneityProperties   Thrombus Aging +---------+---------------+---------+-----------+-------------+--------------+ CFV      Full           No       Yes        Rouleaux flow               +---------+---------------+---------+-----------+-------------+--------------+ SFJ      Full                                                           +---------+---------------+---------+-----------+-------------+--------------+ FV Prox  Full           No       Yes                                    +---------+---------------+---------+-----------+-------------+--------------+ FV Mid   Full           No       Yes                                    +---------+---------------+---------+-----------+-------------+--------------+ FV DistalFull           No       Yes                                    +---------+---------------+---------+-----------+-------------+--------------+  PFV      Full                                                            +---------+---------------+---------+-----------+-------------+--------------+ POP      Full           No       Yes                                    +---------+---------------+---------+-----------+-------------+--------------+ PTV      Full                                                           +---------+---------------+---------+-----------+-------------+--------------+ PERO                                                     Not visualized +---------+---------------+---------+-----------+-------------+--------------+     Summary: RIGHT: - There is no evidence of deep vein thrombosis in the lower extremity. - There is no evidence of superficial venous thrombosis.  - A cystic structure is found in the popliteal fossa.  - Pulsatile waveforms observed throughout the lower extremity, consistent with elevated right heart pressure.  LEFT: - There is no evidence of deep vein thrombosis in the lower extremity. However, portions of this examination were limited- see technologist comments above. - There is no evidence of superficial venous thrombosis.  - No cystic structure found in the popliteal fossa.  - Pulsatile waveforms observed throughout the lower extremity, consistent with elevated right heart pressure.  *See table(s) above for measurements and observations. Electronically signed by Heath Lark on 08/07/2020 at 7:22:02 PM.    Final      Medications:     Scheduled Medications:  amiodarone  200 mg Oral Daily   apixaban  10 mg Oral BID   Followed by   Melene Muller ON 08/14/2020] apixaban  5 mg Oral BID   Chlorhexidine Gluconate Cloth  6 each Topical Daily   guaiFENesin  600 mg Oral BID   insulin aspart  0-9 Units Subcutaneous TID WC   metoprolol tartrate  50 mg Oral BID   sodium chloride flush  3 mL Intravenous Q12H    Infusions:  sodium chloride      PRN Medications: sodium chloride, acetaminophen **OR** acetaminophen, docusate sodium, levalbuterol, metoprolol tartrate,  polyethylene glycol, sodium chloride flush    Patient Profile    Mr Mohr is a 60 year old with a history of a fib, A fib ablation, CVA 2018, COPD, OSA, HTN, obesity, PE 02/2020, and chronic systolic heart failure.   Admitted with chest pain and A fib RVR.  Assessment/Plan   Dyspnea --> multifactorial with PE/ A/C Systolic HF/ A fib  H/O PE back in February 2022. He was not taking eliquis.  CTA this admit  -->Now with acute bilateral submassive PE.  Currently on eliquis 10 mg twice a day until 7/20  then eliquis 5 mg twice a day.  No indication for filter placement. --negative for DVT.   2. A fib RVR  -Had DC-CV 03/12/20  - Admitted witth recurrent A fib RVR. Stop po amio. Start amio drip.  - Stop eliquis and start heparin drip.  - Once diuresed if he remains in A fib will need TEE/DC-CV.   3. A/C Systolic Heart Failure  Echo 02/2020 EF 40-45%--> ECHO repeat and showed EF down 30-35% with RV moerately reduced, and RA/LA moderately dilated.RA 15.  - Plan for cath once fully diuresed.  -Marked volume overload. Start lasix 80 mg IV twice daily.  - with reduced EF will stop lopressor. Anticipate add carvedilol once diuresed.  - Hold off on spiro/entresto.  - Add digoxin 0.125 mg daily.  - Renal function stable.  - Place unna boots.   4. OSA On CPAP.   5. Obesity Body mass index is 40.75 kg/m.  6. H/O embolic CVA   I have referred him to HF Paramedicine program.  Consult cardiac rehab.   Medication concerns reviewed with patient and pharmacy team. Barriers identified: Yes med complicance. Will need referral to HF Paramedicine. Lives in [place]. Of note he cancelled his last 2 appointments in HF clinic back in March.    Length of Stay: 2  Tonye Becket, NP  08/08/2020, 9:09 AM  Advanced Heart Failure Team Pager [phone] (M-F; 7a - 5p)  Please contact [facility] for night-coverage after hours (5p -7a ) and weekends on [email or web address]  Patient seen and examined with the  above-signed Advanced Practice Provider and/or Housestaff. I personally reviewed laboratory data, imaging studies and relevant notes. I independently examined the patient and formulated the important aspects of the plan. I have edited the note to reflect any of my changes or salient points. I have personally discussed the plan with the patient and/or family.  Says he is feeling better. Less SOB. Remains in AF.   General:  Sitting up on side of bed No resp difficulty HEENT: normal Neck: supple. JVP to jaw. Carotids 2+ bilat; no bruits. No lymphadenopathy or thryomegaly appreciated. Cor: PMI nondisplaced. Irregular tachy  Lungs: clear Abdomen: obese soft, nontender, nondistended. No hepatosplenomegaly. No bruits or masses. Good bowel sounds. Extremities: no cyanosis, clubbing, rash, 2+ edema Neuro: alert & orientedx3, cranial nerves grossly intact. moves all 4 extremities w/o difficulty. Affect pleasant   Multiple issues. 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 Needs further diuresis. Would favor R/L cath this admit followed by repeat TEE/DC-CV. Agree with IV amio. Would place on heparin for now for cath. Will need outpatient sleeps study.    Has acute on chronic PE. LE dopplers negative. As above - switch Eliquis to heparin for now. Given medication compliace issues may be better off Xarelto than Eliquis at d/c.   He seems to have significant social challenges understanding his medical conditions and caring for himself. Will need SW and Paramedicine evals. This is now in process.   Arvilla Meres, MD  2:29 PM

## 2020-08-08 NOTE — Progress Notes (Signed)
PROGRESS NOTE    Donald [name]  HYW:[id number] DOB: Sep 14, 1960 DOA: 08/06/2020 PCP: Renaye Rakers, MD    Brief Narrative:  Donald [name] was admitted to the hospital with the working diagnosis of acute on chronic right heart failure due to recurrent pulmonary embolism.   60 year old male past medical history for hypertension, COPD, atrial fibrillation, systolic heart failure, pulmonary embolism, CVA, dyslipidemia who presented with chest pain and palpitations.  He had atrial flutter ablation 2019 and cardioversion in 02/22.  He reported 24 hours of severe palpitations, associated with chest pain and dyspnea.  On his initial physical examination his blood pressure was 97/67, heart rate 118, respirate 29, temperature 98.1, oxygen saturation 91%, heart S1-S2, present, irregularly irregular, lungs with rales at the left lower lobe, no wheezing, positive increased work of breathing, soft abdomen, positive lower extremity edema bilaterally.  Sodium 134, potassium 3.7, chloride 99, bicarb 25, glucose 149, BUN 8, creatinine 1.31, BNP 442, high sensitive troponin 20-21, white count 11.8, hematocrit 13.6, hematocrit 43.1, platelets 240. SARS COVID-19 negative.  Chest radiograph with cardiomegaly, hilar vascular congestion, right base atelectasis.  EKG with 134 bpm, normal axis, normal QTC, atrial fibrillation rhythm, no ST segment T wave changes.  Chest CT with bilateral pulm embolism involving lobar pulmonary arteries and distal on the right and segmental pulmonary arteries on the left.  Positive for acute right heart strain.  Small right pleural effusion with atelectasis.  Right middle lobe groundglass opacity, likely pulmonary hemorrhage/ infarct.  Patient was placed on anticoagulation and admitted to the intensive care unit.  Assessment & Plan:   Principal Problem:   Atrial fibrillation with RVR (HCC) Active Problems:   Type 2 diabetes mellitus without complication (HCC)   Pulmonary embolism  (HCC)   Essential hypertension   COPD (chronic obstructive pulmonary disease) (HCC)   CAP (community acquired pneumonia)   OSA on CPAP   Leukocytosis   Acute pulmonary embolism (HCC)  Acute decompensation of chronic diastolic heart failure (right heart failure), in the setting of acute bilateral pulmonary embolism. Patient continue volume overloaded, but clinically improving. Not yet back to baseline. Urine output over last 24 hrs is 3,175 ml.  Plan to continue diuresis with furosemide 80 mg Iv q12 hrs.  Further workup with right and left heart catheterization, will discontinue apixaban and will place patient on heparin for anticoagulation.   Pulmonary infarct, no signs of infection, continue to hold on antibiotic therapy (pneumonia ruled out).   2. Atrial fibrillation. Continue rate control with amiodarone for rate control, and anticoagulation with IV heparin. Continue with digoxin.   Plan for TEE and electrical cardioversion on this hospitalization.   3. Hx of CVA. Continue neuro checks per unit protocol Continue blood pressure control and statin therapy.   4. HTN. Continue close blood pressure monitoring.  5. COPD. No clinical signs of exacerbation, continue with bronchodilator therapy.   6. T2DM. Continue glucose cover and monitoring with insulin sliding scale for glucose cover and monitoring.   7. Obesity class 3/ OSA. Calculated BMI is 40,7. Continue Cpap.   Patient continue to be at high risk for worsening heart failure   Status is: Inpatient  Remains inpatient appropriate because:Inpatient level of care appropriate due to severity of illness  Dispo: The patient is from: Home              Anticipated d/c is to: Home              Patient currently is not medically stable  to d/c.   Difficult to place patient No   DVT prophylaxis: Heparin   Code Status:   full  Family Communication:  No family at the bedside     Consultants:  Cardiology  Pulmonary    Subjective: Patient continue to have dyspnea and lower extremity edema, improved but not back to baseline, no nausea or vomiting, no chest pain or palpitations.   Objective: Vitals:   08/08/20 0340 08/08/20 0353 08/08/20 0726 08/08/20 1204  BP:  104/87 113/80 105/73  Pulse:  87 (!) 104 100  Resp:  19 18   Temp:  (!) 97.5 F (36.4 C)  98.1 F (36.7 C)  TempSrc:  Oral  Oral  SpO2:  98% 97% 97%  Weight: (!) 137.1 kg (!) 136.3 kg    Height:        Intake/Output Summary (Last 24 hours) at 08/08/2020 1512 Last data filed at 08/08/2020 1257 Gross per 24 hour  Intake 843 ml  Output 1900 ml  Net -1057 ml   Filed Weights   08/07/20 1557 08/08/20 0340 08/08/20 0353  Weight: 131 kg (!) 137.1 kg (!) 136.3 kg    Examination:   General: Not in pain or dyspnea, deconditioned  Neurology: Awake and alert, non focal  E ENT: no pallor, no icterus, oral mucosa moist Cardiovascular: No JVD. S1-S2 present, rhythmic, no gallops, rubs, or murmurs. +/++ pitting bilateral lower extremity edema. Pulmonary: positive breath sounds bilaterally, adequate air movement, no wheezing, rhonchi or rales. Gastrointestinal. Abdomen protuberant Skin. No rashes Musculoskeletal: no joint deformities     Data Reviewed: I have personally reviewed following labs and imaging studies  CBC: Recent Labs  Lab 08/06/20 0754 08/07/20 0423 08/08/20 0414  WBC 11.8* 10.2 11.5*  HGB 13.6 12.7* 12.7*  HCT 43.1 39.4 40.4  MCV 85.9 84.9 85.2  PLT 240 250 258   Basic Metabolic Panel: Recent Labs  Lab 08/06/20 0754 08/06/20 1414 08/07/20 0423 08/08/20 0414  NA 134*  --  131* 132*  K 3.7  --  3.8 4.4  CL 99  --  98 97*  CO2 25  --  22 24  GLUCOSE 149*  --  212* 143*  BUN 8  --  13 16  CREATININE 1.21  --  1.09 1.18  CALCIUM 8.3*  --  8.3* 8.8*  MG  --  1.6* 1.9 1.8  PHOS  --   --  3.0 2.6   GFR: Estimated Creatinine Clearance: 97.6 mL/min (by C-G formula based on SCr of 1.18 mg/dL). Liver Function  Tests: No results for input(s): AST, ALT, ALKPHOS, BILITOT, PROT, ALBUMIN in the last 168 hours. No results for input(s): LIPASE, AMYLASE in the last 168 hours. No results for input(s): AMMONIA in the last 168 hours. Coagulation Profile: No results for input(s): INR, PROTIME in the last 168 hours. Cardiac Enzymes: No results for input(s): CKTOTAL, CKMB, CKMBINDEX, TROPONINI in the last 168 hours. BNP (last 3 results) No results for input(s): PROBNP in the last 8760 hours. HbA1C: Recent Labs    08/06/20 1417  HGBA1C 7.2*   CBG: Recent Labs  Lab 08/07/20 1123 08/07/20 1606 08/07/20 2105 08/08/20 0555 08/08/20 1127  GLUCAP 177* 174* 148* 150* 180*   Lipid Profile: No results for input(s): CHOL, HDL, LDLCALC, TRIG, CHOLHDL, LDLDIRECT in the last 72 hours. Thyroid Function Tests: Recent Labs    08/06/20 1812  TSH 0.536   Anemia Panel: No results for input(s): VITAMINB12, FOLATE, FERRITIN, TIBC, IRON, RETICCTPCT in the last  72 hours.    Radiology Studies: I have reviewed all of the imaging during this hospital visit personally     Scheduled Meds:  apixaban  10 mg Oral BID   Followed by   Melene Muller ON 08/14/2020] apixaban  5 mg Oral BID   Chlorhexidine Gluconate Cloth  6 each Topical Daily   digoxin  0.125 mg Oral Daily   feeding supplement (GLUCERNA SHAKE)  237 mL Oral TID BM   furosemide  80 mg Intravenous BID   guaiFENesin  600 mg Oral BID   insulin aspart  0-9 Units Subcutaneous TID WC   sodium chloride flush  3 mL Intravenous Q12H   Continuous Infusions:  sodium chloride     amiodarone 60 mg/hr (08/08/20 1149)   Followed by   amiodarone       LOS: 2 days        Esme Freund Annett Gula, MD

## 2020-08-08 NOTE — Progress Notes (Signed)
ANTICOAGULATION CONSULT NOTE  Pharmacy Consult for IV Heparin Indication: pulmonary embolus  Allergies  Allergen Reactions   Lisinopril Cough    Patient Measurements: Height: 6' (182.9 cm) Weight: (!) 136.3 kg (300 lb 7.8 oz) IBW/kg (Calculated) : 77.6 Heparin Dosing Weight: 108 kg  Vital Signs: Temp: 98.1 F (36.7 C) (07/14 1204) Temp Source: Oral (07/14 1204) BP: 116/96 (07/14 1600) Pulse Rate: 99 (07/14 1538)  Labs: Recent Labs    08/06/20 0754 08/06/20 1102 08/06/20 1812 08/07/20 0423 08/07/20 0437 08/07/20 0440 08/07/20 0441 08/08/20 0414  HGB 13.6  --   --  12.7*  --   --   --  12.7*  HCT 43.1  --   --  39.4  --   --   --  40.4  PLT 240  --   --  250  --   --   --  258  APTT  --   --  34  --   --   --  48*  --   HEPARINUNFRC  --   --   --   --   --  0.43  --   --   CREATININE 1.21  --   --  1.09  --   --   --  1.18  TROPONINIHS 20* 21*  --   --  21*  --   --   --     Estimated Creatinine Clearance: 97.6 mL/min (by C-G formula based on SCr of 1.18 mg/dL).  Medical History: Past Medical History:  Diagnosis Date   Arrhythmia    A fib    Atrial fibrillation (HCC)    CHF (congestive heart failure) (HCC)    COPD (chronic obstructive pulmonary disease) (HCC)    Hypertension    Sleep apnea    Stroke [facility])     Assessment: 60 yr old man man with PMHx significant for HTN, OSA (on CPAP), COPD, afib (S/P cardioversion 03/12/2020) and aflutter (S/P R-sided ablation 2019), PE (02/2020, on apixaban PTA, last dose 7/11 evening), CVA (2018). Pt presented with chest discomfort, palpitations and LE swelling. CT: bilateral submassive PE with RHS; Dopplers: no evidence of DVT.  Pt was started on IV heparin infusion on admission (last infusion rate was 2300 units/hr), then switched to apixaban on 7/13; last dose apixaban 10 mg was at 0924 AM today). Pharmacy is now consulted to switch pt's anticoagulation back to IV heparin in anticipation of R/L heart cath. Given recent  apixaban exposure, will monitor anticoagulation using aPTT until aPTT and heparin levels correlate.  H/H 12.7/40.4, plt 258; TBW CrCl >> 100 ml/min; per RN, no bleeding issues observed.  Goal of Therapy:  Heparin level 0.3-0.7 units/ml  aPTT:  66-102 sec Monitor platelets by anticoagulation protocol: Yes   Plan:  Start heparin infusion (no bolus) at 2300 units/hr tonight at 2130 (at time next dose of apixaban would be due) Check aPTT 6 hrs after starting heparin infusion Monitor daily aPTT, heparin level, CBC Monitor for bleeding  Vicki Mallet, PharmD, BCPS, [facility] Clinical Pharmacist 08/08/2020,4:40 PM

## 2020-08-09 DIAGNOSIS — I10 Essential (primary) hypertension: Secondary | ICD-10-CM | POA: Diagnosis not present

## 2020-08-09 DIAGNOSIS — I2609 Other pulmonary embolism with acute cor pulmonale: Secondary | ICD-10-CM | POA: Diagnosis not present

## 2020-08-09 DIAGNOSIS — I2699 Other pulmonary embolism without acute cor pulmonale: Principal | ICD-10-CM

## 2020-08-09 DIAGNOSIS — G4733 Obstructive sleep apnea (adult) (pediatric): Secondary | ICD-10-CM | POA: Diagnosis not present

## 2020-08-09 DIAGNOSIS — I4891 Unspecified atrial fibrillation: Secondary | ICD-10-CM | POA: Diagnosis not present

## 2020-08-09 LAB — CBC
HCT: 39.4 % (ref 39.0–52.0)
Hemoglobin: 12.5 g/dL — ABNORMAL LOW (ref 13.0–17.0)
MCH: 26.8 pg (ref 26.0–34.0)
MCHC: 31.7 g/dL (ref 30.0–36.0)
MCV: 84.4 fL (ref 80.0–100.0)
Platelets: 254 10*3/uL (ref 150–400)
RBC: 4.67 MIL/uL (ref 4.22–5.81)
RDW: 15.9 % — ABNORMAL HIGH (ref 11.5–15.5)
WBC: 9.7 10*3/uL (ref 4.0–10.5)
nRBC: 0 % (ref 0.0–0.2)

## 2020-08-09 LAB — BASIC METABOLIC PANEL
Anion gap: 9 (ref 5–15)
BUN: 17 mg/dL (ref 6–20)
CO2: 26 mmol/L (ref 22–32)
Calcium: 8.5 mg/dL — ABNORMAL LOW (ref 8.9–10.3)
Chloride: 96 mmol/L — ABNORMAL LOW (ref 98–111)
Creatinine, Ser: 1.11 mg/dL (ref 0.61–1.24)
GFR, Estimated: >60 mL/min (ref >60)
Glucose, Bld: 130 mg/dL — ABNORMAL HIGH (ref 70–99)
Potassium: 3.9 mmol/L (ref 3.5–5.1)
Sodium: 131 mmol/L — ABNORMAL LOW (ref 135–145)

## 2020-08-09 LAB — APTT
aPTT: 49 seconds — ABNORMAL HIGH (ref 24–36)
aPTT: 57 seconds — ABNORMAL HIGH (ref 24–36)
aPTT: 66 seconds — ABNORMAL HIGH (ref 24–36)

## 2020-08-09 LAB — GLUCOSE, CAPILLARY
Glucose-Capillary: 120 mg/dL — ABNORMAL HIGH (ref 70–99)
Glucose-Capillary: 147 mg/dL — ABNORMAL HIGH (ref 70–99)
Glucose-Capillary: 150 mg/dL — ABNORMAL HIGH (ref 70–99)

## 2020-08-09 LAB — HEMOGLOBIN A1C
Hgb A1c MFr Bld: 7.2 % — ABNORMAL HIGH (ref 4.8–5.6)
Mean Plasma Glucose: 159.94 mg/dL

## 2020-08-09 LAB — HEPARIN LEVEL (UNFRACTIONATED)
Heparin Unfractionated: >1.1 IU/mL — ABNORMAL HIGH (ref 0.30–0.70)
Heparin Unfractionated: >1.1 IU/mL — ABNORMAL HIGH (ref 0.30–0.70)

## 2020-08-09 LAB — TROPONIN I (HIGH SENSITIVITY): Troponin I (High Sensitivity): 14 ng/L (ref <18)

## 2020-08-09 MED ORDER — NITROGLYCERIN 0.4 MG SL SUBL
SUBLINGUAL_TABLET | SUBLINGUAL | Status: AC
  Administered 2020-08-09: 0.4 mg
  Filled 2020-08-09: qty 1

## 2020-08-09 MED ORDER — MORPHINE SULFATE (PF) 2 MG/ML IV SOLN
INTRAVENOUS | Status: AC
  Administered 2020-08-09: 2 mg via INTRAMUSCULAR
  Filled 2020-08-09: qty 1

## 2020-08-09 MED ORDER — SPIRONOLACTONE 12.5 MG HALF TABLET
12.5000 mg | ORAL_TABLET | Freq: Every day | ORAL | Status: DC
  Administered 2020-08-09 – 2020-08-15 (×7): 12.5 mg via ORAL
  Filled 2020-08-09 (×7): qty 1

## 2020-08-09 MED ORDER — METOLAZONE 2.5 MG PO TABS
2.5000 mg | ORAL_TABLET | Freq: Once | ORAL | Status: AC
  Administered 2020-08-09: 2.5 mg via ORAL
  Filled 2020-08-09: qty 1

## 2020-08-09 MED ORDER — MORPHINE SULFATE (PF) 2 MG/ML IV SOLN
2.0000 mg | Freq: Once | INTRAVENOUS | Status: DC
  Filled 2020-08-09: qty 1

## 2020-08-09 NOTE — Progress Notes (Signed)
   08/09/20 0744  Vitals  Temp 98.2 F (36.8 C)  Temp Source Oral  BP 107/85  MAP (mmHg) 94  BP Location Left Arm  BP Method Automatic  Patient Position (if appropriate) Lying  Pulse Rate (!) 118  Pulse Rate Source Monitor  ECG Heart Rate (!) 118  MEWS COLOR  MEWS Score Color Yellow  Oxygen Therapy  SpO2 95 %  O2 Device Room Air  Pain Assessment  Pain Scale 0-10  Pain Score 0  MEWS Score  MEWS Temp 0  MEWS Systolic 0  MEWS Pulse 2  MEWS RR 0  MEWS LOC 0  MEWS Score 2   Patient is a chronic yellow MEWS due to tachycardia and soft BP. Patient is asymptomatic. MD are aware. Will continue to monitor.

## 2020-08-09 NOTE — Progress Notes (Signed)
Orthopedic Tech Progress Note Patient Details:  Donald [name] 04-11-1960 [id number]  Ortho Devices Type of Ortho Device: Roland Rack boot Ortho Device/Splint Location: Bi LE Ortho Device/Splint Interventions: Application   Post Interventions Patient Tolerated: Well  Donald [name] 08/09/2020, 11:46 AM

## 2020-08-09 NOTE — Evaluation (Signed)
Physical Therapy Evaluation Patient Details Name: Donald [name] MRN: [id number] DOB: 1960/01/31 Today's Date: 08/09/2020   History of Present Illness  60 y.o. male presenting on 08/06/2020 for chest discomfort from palpitations. CXR concerning for pneumonia and possible volume overload. Had syncopal episode in ER witnessed by nurse. Chest CT with bilateral pulm embolism. 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  PMH: HTN, COPD, atrial fibrillation with cardioversion on 03/12/20, systolic CHF,  prediabetes, PE in 02/2020, hx of CVA in 2018, HLD, hx of right sided atrial flutter ablation in 2019.  Clinical Impression  Pt reporting apprehension with mobility due to lasix increasing need to urinate. Pt agreeable to therapy and tolerates transfers with cues for hand placement and management of RW. Pt ambulates for household distances with steady step-through gait with use of RW, declining ambulation without AD at this time. Pt negotiates stair with use of bil handrails and alternating pattern. Pt demonstrates deficits in balance and activity tolerance with HR up to 156 during mobility. Pt will benefit from continued acute PT and trial of gait without AD as he does not use one PTA. SPT recommends HHPT to improve activity tolerance and return to prior level.     Follow Up Recommendations Home health PT    Equipment Recommendations  None recommended by PT    Recommendations for Other Services       Precautions / Restrictions Precautions Precautions: Fall Precaution Comments: is on cpap Restrictions Weight Bearing Restrictions: No      Mobility  Bed Mobility Overal bed mobility: Modified Independent             General bed mobility comments: HOB elevated    Transfers Overall transfer level: Needs assistance Equipment used: Rolling walker (2 wheeled) Transfers: Sit to/from Stand Sit to Stand: Min guard Stand pivot transfers: Min guard       General transfer comment: min  G for safety  Ambulation/Gait Ambulation/Gait assistance: Min guard;Supervision Gait Distance (Feet): 250 Feet Assistive device: Rolling walker (2 wheeled) Gait Pattern/deviations: Step-through pattern;Wide base of support Gait velocity: WNL Gait velocity interpretation: >2.62 ft/sec, indicative of community ambulatory General Gait Details: PTA pt ambulates without AD, this session pt prefers to ambulate with use of RW. Pt ambulates with steady step-through gait with wide BOS.  Stairs Stairs: Yes Stairs assistance: Min guard Stair Management: Two rails;Step to pattern;Forwards;Backwards Number of Stairs: 3 General stair comments: Pt with bil hand rail use forwards with stair ascent and backwards with stair descent.  Wheelchair Mobility    Modified Rankin (Stroke Patients Only)       Balance Overall balance assessment: Mild deficits observed, not formally tested                                           Pertinent Vitals/Pain Pain Assessment: No/denies pain    Home Living Family/patient expects to be discharged to:: Private residence Living Arrangements: Alone Available Help at Discharge: Neighbor;Friend(s);Available PRN/intermittently Type of Home: House Home Access: Stairs to enter Entrance Stairs-Rails: Can reach both Entrance Stairs-Number of Steps: 4 Home Layout: One level Home Equipment: Grab bars - tub/shower      Prior Function Level of Independence: Independent               Hand Dominance   Dominant Hand: Right    Extremity/Trunk Assessment   Upper Extremity Assessment Upper Extremity  Assessment: Defer to OT evaluation    Lower Extremity Assessment Lower Extremity Assessment: Overall WFL for tasks assessed    Cervical / Trunk Assessment Cervical / Trunk Assessment: Normal  Communication   Communication: No difficulties  Cognition Arousal/Alertness: Awake/alert Behavior During Therapy: WFL for tasks  assessed/performed Overall Cognitive Status: Within Functional Limits for tasks assessed                                        General Comments General comments (skin integrity, edema, etc.): Pt initally with HR up to 120 at rest. Pt HR up to 156 BPM with mobility. Pt maintaining o2 sats on RA.    Exercises     Assessment/Plan    PT Assessment Patient needs continued PT services  PT Problem List Decreased activity tolerance;Decreased balance;Decreased mobility;Decreased knowledge of use of DME;Decreased knowledge of precautions;Cardiopulmonary status limiting activity       PT Treatment Interventions DME instruction;Gait training;Stair training;Functional mobility training;Therapeutic exercise;Therapeutic activities;Balance training;Patient/family education    PT Goals (Current goals can be found in the Care Plan section)  Acute Rehab PT Goals Patient Stated Goal: go home PT Goal Formulation: With patient Time For Goal Achievement: 08/23/20 Potential to Achieve Goals: Good    Frequency Min 3X/week   Barriers to discharge        Co-evaluation               AM-PAC PT "6 Clicks" Mobility  Outcome Measure Help needed turning from your back to your side while in a flat bed without using bedrails?: None Help needed moving from lying on your back to sitting on the side of a flat bed without using bedrails?: None Help needed moving to and from a bed to a chair (including a wheelchair)?: A Little Help needed standing up from a chair using your arms (e.g., wheelchair or bedside chair)?: A Little Help needed to walk in hospital room?: A Little Help needed climbing 3-5 steps with a railing? : A Little 6 Click Score: 20    End of Session Equipment Utilized During Treatment: Gait belt Activity Tolerance: Patient limited by fatigue Patient left: Other (comment);with call bell/phone within reach (EOB for blood work) Nurse Communication: Mobility status PT  Visit Diagnosis: Other abnormalities of gait and mobility (R26.89);Difficulty in walking, not elsewhere classified (R26.2)    Time: 0932-3557 PT Time Calculation (min) (ACUTE ONLY): 20 min   Charges:   PT Evaluation $PT Eval Low Complexity: 1 Low          Acute Rehab  Pager: [phone]   Waldemar Dickens, SPT  08/09/2020, 2:31 PM

## 2020-08-09 NOTE — Progress Notes (Signed)
Patient with c/o chest pain and dyspnea. O2 placed on patient @ 4L, 5mg  IV Metoprolol given, nitro SL administered, EKG obtained and MD notified. Orders received from Chotiner MD for 2mg  IV Morphine x1. Patient verbalized relief. Patient still afib with rate 120's. Orders entered for serial troponins. Will monitor

## 2020-08-09 NOTE — Progress Notes (Signed)
ANTICOAGULATION CONSULT NOTE  Pharmacy Consult for IV Heparin Indication: pulmonary embolus  Allergies  Allergen Reactions   Lisinopril Cough    Patient Measurements: Height: 6' (182.9 cm) Weight: 135.3 kg (298 lb 3.2 oz) IBW/kg (Calculated) : 77.6 Heparin Dosing Weight: 108 kg  Vital Signs: Temp: 99.5 F (37.5 C) (07/15 1933) Temp Source: Oral (07/15 1933) BP: 129/106 (07/15 1933) Pulse Rate: 128 (07/15 1933)  Labs: Recent Labs    08/07/20 0423 08/07/20 0437 08/07/20 0440 08/07/20 0441 08/08/20 0414 08/09/20 0327 08/09/20 1302 08/09/20 2048  HGB 12.7*  --   --   --  12.7* 12.5*  --   --   HCT 39.4  --   --   --  40.4 39.4  --   --   PLT 250  --   --   --  258 254  --   --   APTT  --   --   --    < >  --  49* 57* 66*  HEPARINUNFRC  --   --  0.43  --   --  >1.10* >1.10*  --   CREATININE 1.09  --   --   --  1.18 1.11  --   --   TROPONINIHS  --  21*  --   --   --   --   --   --    < > = values in this interval not displayed.   Estimated Creatinine Clearance: 103.3 mL/min (by C-G formula based on SCr of 1.11 mg/dL).  Medical History: Past Medical History:  Diagnosis Date   Arrhythmia    A fib    Atrial fibrillation (HCC)    CHF (congestive heart failure) (HCC)    COPD (chronic obstructive pulmonary disease) (HCC)    Hypertension    Sleep apnea    Stroke [facility])     Assessment: 60 yr old man man with PMHx significant for HTN, OSA (on CPAP), COPD, afib (S/P cardioversion 03/12/2020) and aflutter (S/P R-sided ablation 2019), PE (02/2020, on apixaban PTA, last dose 7/11 evening), CVA (2018). Pt presented with chest discomfort, palpitations and LE swelling. CT: bilateral submassive PE with RHS; Dopplers: no evidence of DVT.  Pt was started on IV heparin infusion on admission (last infusion rate was 2300 units/hr), then switched to apixaban on 7/13; last dose apixaban 10 mg was at 0924 AM today). Pharmacy is now consulted to switch pt's anticoagulation back to IV  heparin in anticipation of R/L heart cath. Given recent apixaban exposure, will monitor anticoagulation using aPTT until aPTT and heparin levels correlate.  aPTT therapeutic at the low end of goal after increase to 2600 units/hr.   Goal of Therapy:  Heparin level 0.3-0.7 units/ml  aPTT:  66-102 sec Monitor platelets by anticoagulation protocol: Yes   Plan:  Increase IV heparin to 2700 units/hr.  Follow-up aptt, heparin level and CBC in ~6hrs  Link Snuffer, PharmD, BCPS, BCCCP Clinical Pharmacist Please refer to [facility] for [facility] Pharmacy numbers  08/09/2020 9:22 PM   [facility] pharmacy phone numbers are listed on [email or web address]

## 2020-08-09 NOTE — Progress Notes (Signed)
ANTICOAGULATION CONSULT NOTE  Pharmacy Consult for IV Heparin Indication: pulmonary embolus  Allergies  Allergen Reactions   Lisinopril Cough    Patient Measurements: Height: 6' (182.9 cm) Weight: (!) 136.3 kg (300 lb 7.8 oz) IBW/kg (Calculated) : 77.6 Heparin Dosing Weight: 108 kg  Vital Signs: Temp: 98.2 F (36.8 C) (07/14 2351) Temp Source: Oral (07/14 2351) BP: 128/104 (07/14 2020) Pulse Rate: 95 (07/14 2351)  Labs: Recent Labs    08/06/20 0754 08/06/20 1102 08/06/20 1812 08/07/20 0423 08/07/20 0437 08/07/20 0440 08/07/20 0441 08/08/20 0414 08/09/20 0327  HGB 13.6  --   --  12.7*  --   --   --  12.7* 12.5*  HCT 43.1  --   --  39.4  --   --   --  40.4 39.4  PLT 240  --   --  250  --   --   --  258 254  APTT  --   --  34  --   --   --  48*  --  49*  HEPARINUNFRC  --   --   --   --   --  0.43  --   --  >1.10*  CREATININE 1.21  --   --  1.09  --   --   --  1.18 1.11  TROPONINIHS 20* 21*  --   --  21*  --   --   --   --     Estimated Creatinine Clearance: 103.7 mL/min (by C-G formula based on SCr of 1.11 mg/dL).  Medical History: Past Medical History:  Diagnosis Date   Arrhythmia    A fib    Atrial fibrillation (HCC)    CHF (congestive heart failure) (HCC)    COPD (chronic obstructive pulmonary disease) (HCC)    Hypertension    Sleep apnea    Stroke [facility])     Assessment: 60 yr old man man with PMHx significant for HTN, OSA (on CPAP), COPD, afib (S/P cardioversion 03/12/2020) and aflutter (S/P R-sided ablation 2019), PE (02/2020, on apixaban PTA, last dose 7/11 evening), CVA (2018). Pt presented with chest discomfort, palpitations and LE swelling. CT: bilateral submassive PE with RHS; Dopplers: no evidence of DVT.  Pt was started on IV heparin infusion on admission (last infusion rate was 2300 units/hr), then switched to apixaban on 7/13; last dose apixaban 10 mg was at 0924 AM today). Pharmacy is now consulted to switch pt's anticoagulation back to IV  heparin in anticipation of R/L heart cath. Given recent apixaban exposure, will monitor anticoagulation using aPTT until aPTT and heparin levels correlate.  H/H 12.7/40.4, plt 258; TBW CrCl >> 100 ml/min; per RN, no bleeding issues observed.  7/15 AM update:  aPTT low Hgb stable  Goal of Therapy:  Heparin level 0.3-0.7 units/ml  aPTT:  66-102 sec Monitor platelets by anticoagulation protocol: Yes   Plan:  Inc heparin to 2450 units/hr 1300 aPTT/heparin level  Abran Duke, PharmD, BCPS Clinical Pharmacist Phone: [phone]

## 2020-08-09 NOTE — Progress Notes (Signed)
ANTICOAGULATION CONSULT NOTE  Pharmacy Consult for IV Heparin Indication: pulmonary embolus  Allergies  Allergen Reactions   Lisinopril Cough    Patient Measurements: Height: 6' (182.9 cm) Weight: 135.3 kg (298 lb 3.2 oz) IBW/kg (Calculated) : 77.6 Heparin Dosing Weight: 108 kg  Vital Signs: Temp: 97.7 F (36.5 C) (07/15 1120) Temp Source: Oral (07/15 1120) BP: 118/84 (07/15 1120) Pulse Rate: 102 (07/15 1120)  Labs: Recent Labs    08/07/20 0423 08/07/20 0437 08/07/20 0440 08/07/20 0441 08/08/20 0414 08/09/20 0327 08/09/20 1302  HGB 12.7*  --   --   --  12.7* 12.5*  --   HCT 39.4  --   --   --  40.4 39.4  --   PLT 250  --   --   --  258 254  --   APTT  --   --   --  48*  --  49* 57*  HEPARINUNFRC  --   --  0.43  --   --  >1.10* >1.10*  CREATININE 1.09  --   --   --  1.18 1.11  --   TROPONINIHS  --  21*  --   --   --   --   --     Estimated Creatinine Clearance: 103.3 mL/min (by C-G formula based on SCr of 1.11 mg/dL).  Medical History: Past Medical History:  Diagnosis Date   Arrhythmia    A fib    Atrial fibrillation (HCC)    CHF (congestive heart failure) (HCC)    COPD (chronic obstructive pulmonary disease) (HCC)    Hypertension    Sleep apnea    Stroke [facility])     Assessment: 60 yr old man man with PMHx significant for HTN, OSA (on CPAP), COPD, afib (S/P cardioversion 03/12/2020) and aflutter (S/P R-sided ablation 2019), PE (02/2020, on apixaban PTA, last dose 7/11 evening), CVA (2018). Pt presented with chest discomfort, palpitations and LE swelling. CT: bilateral submassive PE with RHS; Dopplers: no evidence of DVT.  Pt was started on IV heparin infusion on admission (last infusion rate was 2300 units/hr), then switched to apixaban on 7/13; last dose apixaban 10 mg was at 0924 AM today). Pharmacy is now consulted to switch pt's anticoagulation back to IV heparin in anticipation of R/L heart cath. Given recent apixaban exposure, will monitor anticoagulation  using aPTT until aPTT and heparin levels correlate.  aPTT still below goal this afternoon.  No overt bleeding or complications noted. Goal of Therapy:  Heparin level 0.3-0.7 units/ml  aPTT:  66-102 sec Monitor platelets by anticoagulation protocol: Yes   Plan:  Increase IV heparin to 2600 units/hr. Check heparin level in 6 hrs. Daily heparin level and CBC.  Reece Leader, Colon Flattery, BCCP Clinical Pharmacist  08/09/2020 2:20 PM   [facility] pharmacy phone numbers are listed on [email or web address]

## 2020-08-09 NOTE — Evaluation (Signed)
Occupational Therapy Evaluation Patient Details Name: Donald [name] MRN: [id number] DOB: Aug 14, 1960 Today's Date: 08/09/2020    History of Present Illness Donald [name] was admitted to the hospital with the working diagnosis of acute on chronic right heart failure due to recurrent pulmonary embolism. Donald [name] is a 60 y/o gentleman with a history of PE in 02/2020, biventricular HFrEF, chronic respiratory failure with hypoxia, Afib s/p ablation who presented with sudden onset CP at 4 this morning. Pain worse with coughing and breathing, but improved since arrival. No dizziness at home, but had a syncopal episode he doesn't remember in the ED today. He has had trouble keeping his medications straight and often misses doses of his eliquis. He is on 3-4L O2 at baseline. Former smoker. CTA in the ED demonstrated multilobar Pes. Not yet on heparin.       PMH, PSH, Shx, Fhx reviewed. ROS + CP, SOB, syncope, edema. No f/c/s, GI symptoms.   Clinical Impression   Pt. Was very cooperative with therapy. Pt. Did not want to amb secondary he has to urinate very frequently. Pt. Is S to Min A with his ADLs and would benefit with energy conservation. Pt. Does want to continue with OT on acute and at home. Pt. Lives alone and states he has very caring neighbors that check in on him.     Follow Up Recommendations  Home health OT    Equipment Recommendations  None recommended by OT    Recommendations for Other Services       Precautions / Restrictions Precautions Precautions: Fall Precaution Comments: is on cpap Restrictions Weight Bearing Restrictions: No      Mobility Bed Mobility Overal bed mobility: Independent                  Transfers Overall transfer level: Needs assistance Equipment used: Rolling walker (2 wheeled) Transfers: Stand Pivot Transfers;Sit to/from Stand Sit to Stand: Min guard Stand pivot transfers: Min guard            Balance                                            ADL either performed or assessed with clinical judgement   ADL Overall ADL's : Needs assistance/impaired Eating/Feeding: Independent   Grooming: Wash/dry hands;Wash/dry face;Standing   Upper Body Bathing: Supervision/ safety;Sitting   Lower Body Bathing: Minimal assistance;Sit to/from stand   Upper Body Dressing : Set up;Sitting   Lower Body Dressing: Moderate assistance;Sit to/from stand   Toilet Transfer: Min guard   Toileting- Architect and Hygiene: Min guard       Functional mobility during ADLs: Min guard (sit to stand and stand pivot. Pt. deferred amb secondary to lasic) General ADL Comments: Pt. needs further ed on energy conservation.     Vision Baseline Vision/History: Wears glasses Wears Glasses: Reading only;Distance only Patient Visual Report: No change from baseline Vision Assessment?: No apparent visual deficits     Perception     Praxis      Pertinent Vitals/Pain Pain Assessment: No/denies pain     Hand Dominance Right   Extremity/Trunk Assessment Upper Extremity Assessment Upper Extremity Assessment: Overall WFL for tasks assessed   Lower Extremity Assessment Lower Extremity Assessment: Defer to PT evaluation       Communication Communication Communication: No difficulties   Cognition Arousal/Alertness: Awake/alert Behavior During Therapy: [facility] for  tasks assessed/performed Overall Cognitive Status: Within Functional Limits for tasks assessed                                     General Comments       Exercises     Shoulder Instructions      Home Living Family/patient expects to be discharged to:: Private residence Living Arrangements: Alone Available Help at Discharge: Neighbor;Friend(s);Available PRN/intermittently Type of Home: House Home Access: Stairs to enter [employer] of Steps: 4 states pt. Entrance Stairs-Rails: Can reach both Home Layout: One level      Bathroom Shower/Tub: Chief Strategy Officer: Handicapped height     Home Equipment: Grab bars - tub/shower          Prior Functioning/Environment Level of Independence: Independent                 OT Problem List: Decreased activity tolerance;Decreased knowledge of use of DME or AE;Cardiopulmonary status limiting activity      OT Treatment/Interventions: Self-care/ADL training;Energy conservation;DME and/or AE instruction;Therapeutic activities;Patient/family education    OT Goals(Current goals can be found in the care plan section) Acute Rehab OT Goals Patient Stated Goal: go home OT Goal Formulation: With patient Time For Goal Achievement: 08/23/20 Potential to Achieve Goals: Good ADL Goals Pt Will Perform Lower Body Bathing: with modified independence Pt Will Perform Lower Body Dressing: with modified independence Pt Will Perform Toileting - Clothing Manipulation and hygiene: with modified independence  OT Frequency: Min 2X/week   Barriers to D/C: Decreased caregiver support          Co-evaluation              AM-PAC OT "6 Clicks" Daily Activity     Outcome Measure Help from another person eating meals?: None Help from another person taking care of personal grooming?: A Little Help from another person toileting, which includes using toliet, bedpan, or urinal?: A Little Help from another person bathing (including washing, rinsing, drying)?: A Little Help from another person to put on and taking off regular upper body clothing?: A Little Help from another person to put on and taking off regular lower body clothing?: A Little 6 Click Score: 19   End of Session Equipment Utilized During Treatment: Rolling walker Nurse Communication:  (ok therapy)  Activity Tolerance: Patient tolerated treatment well Patient left: in bed;with call bell/phone within reach  OT Visit Diagnosis: Unsteadiness on feet (R26.81);Muscle weakness (generalized) (M62.81)                 Time: 5852-7782 OT Time Calculation (min): 47 min Charges:  OT General Charges $OT Visit: 1 Visit OT Evaluation $OT Eval Moderate Complexity: 1 Mod OT Treatments $Self Care/Home Management : 8-22 mins  Derrek Gu OT/L   Nasira Janusz 08/09/2020, 12:28 PM

## 2020-08-09 NOTE — Progress Notes (Signed)
PROGRESS NOTE    Donald [name]  TWS:[id number] DOB: April 03, 1960 DOA: 08/06/2020 PCP: Renaye Rakers, MD    Brief Narrative:  Donald [name] was admitted to the hospital with the working diagnosis of acute on chronic right heart failure due to recurrent pulmonary embolism.    60 year old male past medical history for hypertension, COPD, atrial fibrillation, systolic heart failure, pulmonary embolism, CVA, dyslipidemia who presented with chest pain and palpitations.  He had atrial flutter ablation 2019 and cardioversion in 02/22.  He reported 24 hours of severe palpitations, associated with chest pain and dyspnea.  On his initial physical examination his blood pressure was 97/67, heart rate 118, respirate 29, temperature 98.1, oxygen saturation 91%, heart S1-S2, present, irregularly irregular, lungs with rales at the left lower lobe, no wheezing, positive increased work of breathing, soft abdomen, positive lower extremity edema bilaterally.   Sodium 134, potassium 3.7, chloride 99, bicarb 25, glucose 149, BUN 8, creatinine 1.31, BNP 442, high sensitive troponin 20-21, white count 11.8, hematocrit 13.6, hematocrit 43.1, platelets 240. SARS COVID-19 negative.   Chest radiograph with cardiomegaly, hilar vascular congestion, right base atelectasis.   EKG with 134 bpm, normal axis, normal QTC, atrial fibrillation rhythm, no ST segment T wave changes.   Chest CT with bilateral pulm embolism involving lobar pulmonary arteries and distal on the right and segmental pulmonary arteries on the left.  Positive for acute right heart strain.  Small right pleural effusion with atelectasis.  Right middle lobe groundglass opacity, likely pulmonary hemorrhage/ infarct.   Patient was placed on anticoagulation and admitted to the intensive care unit.  Remained hemodynamically stable, transfer to [facility] on 07/14.    Assessment & Plan:   Principal Problem:   Atrial fibrillation with RVR (HCC) Active Problems:   Type 2  diabetes mellitus without complication (HCC)   Pulmonary embolism (HCC)   Essential hypertension   COPD (chronic obstructive pulmonary disease) (HCC)   CAP (community acquired pneumonia)   OSA on CPAP   Leukocytosis   Acute pulmonary embolism (HCC)   Acute decompensation of chronic diastolic heart failure (right heart failure), in the setting of acute bilateral pulmonary embolism. Clinically his volume status continue to improve but not yet back to baseline. Urine output over last 24 hrs is 2,850 ml. Warm lower extremities, systolic blood pressure 118 mmHg.    Diuresis with furosemide 80 mg Iv q12 hrs.  Plan for further workup with right and left heart catheterization, will discontinue apixaban and will place patient on heparin for anticoagulation.   Pulmonary infarct, no signs of infection, continue to hold on antibiotic therapy (pneumonia ruled out).   2. Atrial fibrillation. Tolerating well amiodarone for rate control, alon g with digoxin. Anticoagulation with heparin per cardiology recommendations.   For TEE and electrical cardioversion on this hospitalization.   3. Hx of CVA. Continue aspirin and satatin   4. HTN. Blood pressure stable, continue with aggressive diuresis/.    5. COPD. No exacerbation. On bronchodilator therapy.   6. T2DM. On insulin sliding scale for glucose cover and monitoring.   7. Obesity class 3/ OSA. Calculated BMI is 40,7. On Cpap   Patient continue to be at high risk for worsening heart failure   Status is: Inpatient  Remains inpatient appropriate because:Inpatient level of care appropriate due to severity of illness  Dispo: The patient is from: Home              Anticipated d/c is to: Home  Patient currently is not medically stable to d/c.   Difficult to place patient No   DVT prophylaxis: Heparin   Code Status:   full  Family Communication:  No family at the bedside      Consultants:  Cardiology   Subjective: Patient  continue to be very weak, somnolent at the time of my examination, improving dyspnea and lower extremity edema,   Objective: Vitals:   08/09/20 0527 08/09/20 0528 08/09/20 0744 08/09/20 1120  BP:  (!) 133/95 107/85 118/84  Pulse:  96 (!) 118 (!) 102  Resp:  18  19  Temp:  98 F (36.7 C) 98.2 F (36.8 C) 97.7 F (36.5 C)  TempSrc:  Oral Oral Oral  SpO2:  96% 95% 99%  Weight: 135.3 kg 135.3 kg    Height:        Intake/Output Summary (Last 24 hours) at 08/09/2020 1452 Last data filed at 08/09/2020 1431 Gross per 24 hour  Intake 1523 ml  Output 6706 ml  Net -5183 ml   Filed Weights   08/08/20 0353 08/09/20 0527 08/09/20 0528  Weight: (!) 136.3 kg 135.3 kg 135.3 kg    Examination:   General: Not in pain or dyspnea, deconditioned  Neurology: somnolent, non focal, easy to arouse. E ENT: no pallor, no icterus, oral mucosa moist Cardiovascular: No JVD. S1-S2 present, rhythmic, no gallops, rubs, or murmurs. +/++ bilateral lower extremity edema. Pulmonary:  positive breath sounds bilaterally, with no wheezing, rhonchi or rales. Gastrointestinal. Abdomen protuberant  Skin. No rashes Musculoskeletal: no joint deformities     Data Reviewed: I have personally reviewed following labs and imaging studies  CBC: Recent Labs  Lab 08/06/20 0754 08/07/20 0423 08/08/20 0414 08/09/20 0327  WBC 11.8* 10.2 11.5* 9.7  HGB 13.6 12.7* 12.7* 12.5*  HCT 43.1 39.4 40.4 39.4  MCV 85.9 84.9 85.2 84.4  PLT 240 250 258 254   Basic Metabolic Panel: Recent Labs  Lab 08/06/20 0754 08/06/20 1414 08/07/20 0423 08/08/20 0414 08/09/20 0327  NA 134*  --  131* 132* 131*  K 3.7  --  3.8 4.4 3.9  CL 99  --  98 97* 96*  CO2 25  --  22 24 26   GLUCOSE 149*  --  212* 143* 130*  BUN 8  --  13 16 17   CREATININE 1.21  --  1.09 1.18 1.11  CALCIUM 8.3*  --  8.3* 8.8* 8.5*  MG  --  1.6* 1.9 1.8  --   PHOS  --   --  3.0 2.6  --    GFR: Estimated Creatinine Clearance: 103.3 mL/min (by C-G formula  based on SCr of 1.11 mg/dL). Liver Function Tests: No results for input(s): AST, ALT, ALKPHOS, BILITOT, PROT, ALBUMIN in the last 168 hours. No results for input(s): LIPASE, AMYLASE in the last 168 hours. No results for input(s): AMMONIA in the last 168 hours. Coagulation Profile: No results for input(s): INR, PROTIME in the last 168 hours. Cardiac Enzymes: No results for input(s): CKTOTAL, CKMB, CKMBINDEX, TROPONINI in the last 168 hours. BNP (last 3 results) No results for input(s): PROBNP in the last 8760 hours. HbA1C: No results for input(s): HGBA1C in the last 72 hours. CBG: Recent Labs  Lab 08/08/20 1127 08/08/20 1528 08/08/20 2056 08/09/20 0511 08/09/20 1125  GLUCAP 180* 185* 149* 120* 147*   Lipid Profile: No results for input(s): CHOL, HDL, LDLCALC, TRIG, CHOLHDL, LDLDIRECT in the last 72 hours. Thyroid Function Tests: Recent Labs    08/06/20  1812  TSH 0.536   Anemia Panel: No results for input(s): VITAMINB12, FOLATE, FERRITIN, TIBC, IRON, RETICCTPCT in the last 72 hours.    Radiology Studies: I have reviewed all of the imaging during this hospital visit personally     Scheduled Meds:  Chlorhexidine Gluconate Cloth  6 each Topical Daily   digoxin  0.125 mg Oral Daily   feeding supplement (GLUCERNA SHAKE)  237 mL Oral TID BM   furosemide  80 mg Intravenous BID   guaiFENesin  600 mg Oral BID   insulin aspart  0-9 Units Subcutaneous TID WC   sodium chloride flush  3 mL Intravenous Q12H   spironolactone  12.5 mg Oral Daily   Continuous Infusions:  sodium chloride     amiodarone 60 mg/hr (08/09/20 1316)   heparin 2,600 Units/hr (08/09/20 1431)     LOS: 3 days        Jamie Hafford Annett Gula, MD

## 2020-08-09 NOTE — Progress Notes (Signed)
Patient had placed self on CPAP before RT entered room. Tolerating well. RT will continue to monitor.

## 2020-08-09 NOTE — Plan of Care (Signed)
  Problem: Activity: Goal: Risk for activity intolerance will decrease Outcome: Progressing   Problem: Nutrition: Goal: Adequate nutrition will be maintained Outcome: Progressing   Problem: Coping: Goal: Level of anxiety will decrease Outcome: Progressing   

## 2020-08-09 NOTE — Progress Notes (Addendum)
Advanced Heart Failure Rounding Note  PCP-Cardiologist: Dr Rudolpho Sevin   Subjective:   Missed HF follow x2 in March.   7/13 Admitted with chest pain and A fib RVR.  CTA with acute bilateral PE with RV strain, consistent with submassive PE. Started on eliquis 10 mg twice a day. Started on IV lasix. Negative 2.1 liters.  7/14 Started in IV amio and diuresed with IV lasix.  Eliqiuis stopped and started on heparin drip   Denies SOB. Doesn't want to walk out of the room while taking lasix.   Objective:   Weight Range: 135.3 kg Body mass index is 40.44 kg/m.   Vital Signs:   Temp:  [98 F (36.7 C)-98.7 F (37.1 C)] 98.2 F (36.8 C) (07/15 0744) Pulse Rate:  [95-118] 118 (07/15 0744) Resp:  [18] 18 (07/15 0528) BP: (105-133)/(73-104) 107/85 (07/15 0744) SpO2:  [95 %-99 %] 95 % (07/15 0744) Weight:  [135.3 kg] 135.3 kg (07/15 0528) Last BM Date: 08/06/20  Weight change: Filed Weights   08/08/20 0353 08/09/20 0527 08/09/20 0528  Weight: (!) 136.3 kg 135.3 kg 135.3 kg    Intake/Output:   Intake/Output Summary (Last 24 hours) at 08/09/2020 0940 Last data filed at 08/09/2020 0939 Gross per 24 hour  Intake 1342 ml  Output 4794 ml  Net -3452 ml      Physical Exam   General:   No resp difficulty. In bed.  HEENT: normal Neck: supple. JVP 11-12 Carotids 2+ bilat; no bruits. No lymphadenopathy or thryomegaly appreciated. Cor: PMI nondisplaced. Irregular rate & rhythm. No rubs, gallops or murmurs. Lungs: clear Abdomen: soft, nontender, nondistended. No hepatosplenomegaly. No bruits or masses. Good bowel sounds. Extremities: no cyanosis, clubbing, rash, R and LLE 1-2+edema Neuro: alert & orientedx3, cranial nerves grossly intact. moves all 4 extremities w/o difficulty. Affect pleasant   Telemetry    A fib 100-130s    EKG    N/A  Labs    CBC Recent Labs    08/08/20 0414 08/09/20 0327  WBC 11.5* 9.7  HGB 12.7* 12.5*  HCT 40.4 39.4  MCV 85.2 84.4  PLT 258 254    Basic Metabolic Panel Recent Labs    59/45/85 0423 08/08/20 0414 08/09/20 0327  NA 131* 132* 131*  K 3.8 4.4 3.9  CL 98 97* 96*  CO2 22 24 26   GLUCOSE 212* 143* 130*  BUN 13 16 17   CREATININE 1.09 1.18 1.11  CALCIUM 8.3* 8.8* 8.5*  MG 1.9 1.8  --   PHOS 3.0 2.6  --    Liver Function Tests No results for input(s): AST, ALT, ALKPHOS, BILITOT, PROT, ALBUMIN in the last 72 hours. No results for input(s): LIPASE, AMYLASE in the last 72 hours. Cardiac Enzymes No results for input(s): CKTOTAL, CKMB, CKMBINDEX, TROPONINI in the last 72 hours.  BNP: BNP (last 3 results) Recent Labs    03/18/20 1633 08/06/20 0754 08/07/20 0423  BNP 254.6* 442.4* 650.8*    ProBNP (last 3 results) No results for input(s): PROBNP in the last 8760 hours.   D-Dimer No results for input(s): DDIMER in the last 72 hours. Hemoglobin A1C Recent Labs    08/06/20 1417  HGBA1C 7.2*   Fasting Lipid Panel No results for input(s): CHOL, HDL, LDLCALC, TRIG, CHOLHDL, LDLDIRECT in the last 72 hours. Thyroid Function Tests Recent Labs    08/06/20 1812  TSH 0.536    Other results:   Imaging    No results found.   Medications:  Scheduled Medications:  Chlorhexidine Gluconate Cloth  6 each Topical Daily   digoxin  0.125 mg Oral Daily   feeding supplement (GLUCERNA SHAKE)  237 mL Oral TID BM   furosemide  80 mg Intravenous BID   guaiFENesin  600 mg Oral BID   insulin aspart  0-9 Units Subcutaneous TID WC   sodium chloride flush  3 mL Intravenous Q12H    Infusions:  sodium chloride     amiodarone 30 mg/hr (08/09/20 0249)   heparin 2,450 Units/hr (08/09/20 0803)    PRN Medications: sodium chloride, acetaminophen **OR** acetaminophen, docusate sodium, levalbuterol, metoprolol tartrate, polyethylene glycol, sodium chloride flush    Patient Profile    Donald [name] is a 60 year old with a history of a fib, A fib ablation, CVA 2018, COPD, OSA, HTN, obesity, PE 02/2020, and chronic  systolic heart failure.   Admitted with chest pain and A fib RVR.  Assessment/Plan   Dyspnea --> multifactorial with PE/ A/C Systolic HF/ A fib  H/O PE back in February 2022. He was not taking eliquis.  CTA this admit  -->Now with acute bilateral submassive PE.  -No indication for filter placement. --negative for DVT.   Eliqiuis stopped and started on heparin drip   2. A fib RVR  -Had DC-CV 03/12/20  - Admitted witth recurrent A fib RVR. 7/14 Stopped  po amio and started amio drip.  - Remains in A fib.  - Rate remains uncontrolled. Increase amio drip to 60 mg per hour.   - Eliqiuis stopped and started on heparin drip  - Once diuresed if he remains in A fib will need TEE/DC-CV.   3. A/C Systolic Heart Failure  Echo 02/2020 EF 40-45%--> ECHO repeat and showed EF down 30-35% with RV moerately reduced, and RA/LA moderately dilated.RA 15.  - Plan for cath on Monday.  -Remains volume overloaded Continue  lasix 80 mg IV twice daily and give 2.5 mg metolazone.   - with reduced EF will stop lopressor. Anticipate adding carvedilol once diuresed.  -Add 12.5 mg spiro daily  - hold off on entresto.  - Continue digoxin 0.125 mg daily.  - Renal function stable.  - Place unna boots.   4. OSA On CPAP.   5. Obesity Body mass index is 40.44 kg/m.  6. H/O embolic CVA   He has been referred to HF Paramedicine program.  Consult cardiac rehab.   Medication concerns reviewed with patient and pharmacy team. Barriers identified: Yes med complicance. Will need referral to HF Paramedicine. Lives in [place]. Of note he cancelled his last 2 appointments in HF clinic back in March.    Length of Stay: 3  Amy Clegg, NP  08/09/2020, 9:40 AM  Advanced Heart Failure Team Pager [phone] (M-F; 7a - 5p)  Please contact [facility] for night-coverage after hours (5p -7a ) and weekends on [email or web address]  Patient seen and examined with the above-signed Advanced Practice Provider and/or Housestaff. I  personally reviewed laboratory data, imaging studies and relevant notes. I independently examined the patient and formulated the important aspects of the plan. I have edited the note to reflect any of my changes or salient points. I have personally discussed the plan with the patient and/or family.  Remains in AF with RVR on IV amio. Diuresing on IV lasix. Still mildly SOB. + orthopnea  General:  Sitting up in bed No resp difficulty HEENT: normal Neck: supple. JVP to jaw  Carotids 2+ bilat; no bruits. No lymphadenopathy or thryomegaly  appreciated. Cor: PMI nondisplaced. Irregular tachy . Lungs: clear Abdomen: soft, nontender, nondistended. No hepatosplenomegaly. No bruits or masses. Good bowel sounds. Extremities: no cyanosis, clubbing, rash, 2+ edema Neuro: alert & orientedx3, cranial nerves grossly intact. moves all 4 extremities w/o difficulty. Affect pleasant  He has a ways to go.. AF still fast and still volume overloaded. Continue IV diuresis. Continue heparin. R/L cath on Monday followed by TEE/DC-CV likely on Tuesday.   Arvilla Meres, MD  6:09 PM

## 2020-08-10 DIAGNOSIS — I4891 Unspecified atrial fibrillation: Secondary | ICD-10-CM | POA: Diagnosis not present

## 2020-08-10 DIAGNOSIS — J449 Chronic obstructive pulmonary disease, unspecified: Secondary | ICD-10-CM | POA: Diagnosis not present

## 2020-08-10 DIAGNOSIS — G4733 Obstructive sleep apnea (adult) (pediatric): Secondary | ICD-10-CM | POA: Diagnosis not present

## 2020-08-10 DIAGNOSIS — I2609 Other pulmonary embolism with acute cor pulmonale: Secondary | ICD-10-CM | POA: Diagnosis not present

## 2020-08-10 DIAGNOSIS — I10 Essential (primary) hypertension: Secondary | ICD-10-CM | POA: Diagnosis not present

## 2020-08-10 LAB — GLUCOSE, CAPILLARY
Glucose-Capillary: 150 mg/dL — ABNORMAL HIGH (ref 70–99)
Glucose-Capillary: 173 mg/dL — ABNORMAL HIGH (ref 70–99)
Glucose-Capillary: 135 mg/dL — ABNORMAL HIGH (ref 70–99)
Glucose-Capillary: 183 mg/dL — ABNORMAL HIGH (ref 70–99)

## 2020-08-10 LAB — BASIC METABOLIC PANEL
Anion gap: 12 (ref 5–15)
BUN: 18 mg/dL (ref 6–20)
CO2: 31 mmol/L (ref 22–32)
Calcium: 8.6 mg/dL — ABNORMAL LOW (ref 8.9–10.3)
Chloride: 88 mmol/L — ABNORMAL LOW (ref 98–111)
Creatinine, Ser: 1.09 mg/dL (ref 0.61–1.24)
GFR, Estimated: >60 mL/min (ref >60)
Glucose, Bld: 157 mg/dL — ABNORMAL HIGH (ref 70–99)
Potassium: 3.1 mmol/L — ABNORMAL LOW (ref 3.5–5.1)
Sodium: 131 mmol/L — ABNORMAL LOW (ref 135–145)

## 2020-08-10 LAB — APTT: aPTT: 70 seconds — ABNORMAL HIGH (ref 24–36)

## 2020-08-10 LAB — TROPONIN I (HIGH SENSITIVITY)
Troponin I (High Sensitivity): 16 ng/L (ref <18)
Troponin I (High Sensitivity): 14 ng/L (ref <18)

## 2020-08-10 LAB — HEPARIN LEVEL (UNFRACTIONATED): Heparin Unfractionated: 0.68 IU/mL (ref 0.30–0.70)

## 2020-08-10 LAB — CBC
HCT: 37.9 % — ABNORMAL LOW (ref 39.0–52.0)
Hemoglobin: 12.5 g/dL — ABNORMAL LOW (ref 13.0–17.0)
MCH: 27.2 pg (ref 26.0–34.0)
MCHC: 33 g/dL (ref 30.0–36.0)
MCV: 82.6 fL (ref 80.0–100.0)
Platelets: 262 10*3/uL (ref 150–400)
RBC: 4.59 MIL/uL (ref 4.22–5.81)
RDW: 15.6 % — ABNORMAL HIGH (ref 11.5–15.5)
WBC: 7.7 10*3/uL (ref 4.0–10.5)
nRBC: 0 % (ref 0.0–0.2)

## 2020-08-10 LAB — MAGNESIUM: Magnesium: 1.6 mg/dL — ABNORMAL LOW (ref 1.7–2.4)

## 2020-08-10 MED ORDER — POTASSIUM CHLORIDE CRYS ER 20 MEQ PO TBCR
40.0000 meq | EXTENDED_RELEASE_TABLET | Freq: Three times a day (TID) | ORAL | Status: AC
  Administered 2020-08-10 (×3): 40 meq via ORAL
  Filled 2020-08-10 (×3): qty 2

## 2020-08-10 MED ORDER — MAGNESIUM SULFATE 4 GM/100ML IV SOLN
4.0000 g | Freq: Once | INTRAVENOUS | Status: AC
  Administered 2020-08-10: 4 g via INTRAVENOUS
  Filled 2020-08-10: qty 100

## 2020-08-10 MED ORDER — POTASSIUM CHLORIDE CRYS ER 20 MEQ PO TBCR
40.0000 meq | EXTENDED_RELEASE_TABLET | Freq: Once | ORAL | Status: AC
  Administered 2020-08-10: 40 meq via ORAL
  Filled 2020-08-10: qty 2

## 2020-08-10 MED ORDER — POTASSIUM CHLORIDE CRYS ER 20 MEQ PO TBCR
40.0000 meq | EXTENDED_RELEASE_TABLET | Freq: Two times a day (BID) | ORAL | Status: DC
  Administered 2020-08-11 – 2020-08-13 (×5): 40 meq via ORAL
  Filled 2020-08-10 (×5): qty 2

## 2020-08-10 MED ORDER — POTASSIUM CHLORIDE CRYS ER 20 MEQ PO TBCR: 40.0000 meq | EXTENDED_RELEASE_TABLET | Freq: Two times a day (BID) | ORAL | Status: DC

## 2020-08-10 MED ORDER — CYCLOBENZAPRINE HCL 5 MG PO TABS
5.0000 mg | ORAL_TABLET | Freq: Three times a day (TID) | ORAL | Status: DC | PRN
  Administered 2020-08-10: 5 mg via ORAL
  Filled 2020-08-10: qty 1

## 2020-08-10 MED ORDER — SACUBITRIL-VALSARTAN 24-26 MG PO TABS
1.0000 | ORAL_TABLET | Freq: Two times a day (BID) | ORAL | Status: DC
  Administered 2020-08-10 – 2020-08-15 (×11): 1 via ORAL
  Filled 2020-08-10 (×11): qty 1

## 2020-08-10 NOTE — Progress Notes (Signed)
ANTICOAGULATION CONSULT NOTE  Pharmacy Consult for IV Heparin Indication: pulmonary embolus  Allergies  Allergen Reactions   Lisinopril Cough    Patient Measurements: Height: 6' (182.9 cm) Weight: 135.3 kg (298 lb 3.2 oz) IBW/kg (Calculated) : 77.6 Heparin Dosing Weight: 108 kg  Vital Signs: Temp: 99.5 F (37.5 C) (07/15 1933) Temp Source: Oral (07/15 1933) BP: 129/106 (07/15 1933) Pulse Rate: 128 (07/15 1933)  Labs: Recent Labs    08/07/20 0423 08/07/20 0437 08/07/20 0440 08/08/20 0414 08/09/20 0327 08/09/20 1302 08/09/20 2048 08/09/20 2137 08/09/20 2301 08/10/20 0307  HGB 12.7*  --   --  12.7* 12.5*  --   --   --   --  12.5*  HCT 39.4  --   --  40.4 39.4  --   --   --   --  37.9*  PLT 250  --   --  258 254  --   --   --   --  262  APTT  --   --    < >  --  49* 57* 66*  --   --  70*  HEPARINUNFRC  --   --    < >  --  >1.10* >1.10*  --   --   --  0.68  CREATININE 1.09  --   --  1.18 1.11  --   --   --   --   --   TROPONINIHS  --  21*  --   --   --   --   --  14 16  --    < > = values in this interval not displayed.    Estimated Creatinine Clearance: 103.3 mL/min (by C-G formula based on SCr of 1.11 mg/dL).  Medical History: Past Medical History:  Diagnosis Date   Arrhythmia    A fib    Atrial fibrillation (HCC)    CHF (congestive heart failure) (HCC)    COPD (chronic obstructive pulmonary disease) (HCC)    Hypertension    Sleep apnea    Stroke [facility])     Assessment: 60 yr old man man with PMHx significant for HTN, OSA (on CPAP), COPD, afib (S/P cardioversion 03/12/2020) and aflutter (S/P R-sided ablation 2019), PE (02/2020, on apixaban PTA, last dose 7/11 evening), CVA (2018). Pt presented with chest discomfort, palpitations and LE swelling. CT: bilateral submassive PE with RHS; Dopplers: no evidence of DVT.  Pt was started on IV heparin infusion on admission (last infusion rate was 2300 units/hr), then switched to apixaban on 7/13; last dose apixaban 10  mg was at 0924 AM today). Pharmacy is now consulted to switch pt's anticoagulation back to IV heparin in anticipation of R/L heart cath. Given recent apixaban exposure, will monitor anticoagulation using aPTT until aPTT and heparin levels correlate.  PTT therapeutic (70 sec) on heparin a 2700 units/hr, Heparin level 0.68 (affected by apixaban)  Goal of Therapy:  Heparin level 0.3-0.7 units/ml  aPTT:  66-102 sec Monitor platelets by anticoagulation protocol: Yes   Plan:  Continue IV heparin at 2700 units/hr  Daily heparin level and PTT  Christoper Fabian, PharmD, BCPS Please see amion for complete clinical pharmacist phone list  08/10/2020 3:56 AM

## 2020-08-10 NOTE — Progress Notes (Signed)
PROGRESS NOTE    Donald [name]  IWP:[id number] DOB: April 02, 1960 DOA: 08/06/2020 PCP: Donald Rakers, MD    Brief Narrative:  Donald [name] was admitted to the hospital with the working diagnosis of acute on chronic right heart failure due to recurrent pulmonary embolism.    60 year old male past medical history for hypertension, COPD, atrial fibrillation, systolic heart failure, pulmonary embolism, CVA, dyslipidemia who presented with chest pain and palpitations.  He had atrial flutter ablation 2019 and cardioversion in 02/22.  He reported 24 hours of severe palpitations, associated with chest pain and dyspnea.  On his initial physical examination his blood pressure was 97/67, heart rate 118, respirate 29, temperature 98.1, oxygen saturation 91%, heart S1-S2, present, irregularly irregular, lungs with rales at the left lower lobe, no wheezing, positive increased work of breathing, soft abdomen, positive lower extremity edema bilaterally.   Sodium 134, potassium 3.7, chloride 99, bicarb 25, glucose 149, BUN 8, creatinine 1.31, BNP 442, high sensitive troponin 20-21, white count 11.8, hematocrit 13.6, hematocrit 43.1, platelets 240. SARS COVID-19 negative.   Chest radiograph with cardiomegaly, hilar vascular congestion, right base atelectasis.   EKG with 134 bpm, normal axis, normal QTC, atrial fibrillation rhythm, no ST segment T wave changes.   Chest CT with bilateral pulm embolism involving lobar pulmonary arteries and distal on the right and segmental pulmonary arteries on the left.  Positive for acute right heart strain.  Small right pleural effusion with atelectasis.  Right middle lobe groundglass opacity, likely pulmonary hemorrhage/ infarct.   Patient was placed on anticoagulation and admitted to the intensive care unit.   Remained hemodynamically stable, transfer to [facility] on 07/14.  Responding well to diuresis.  Pulmonary infarct, no signs of infection, continue to hold on antibiotic  therapy (pneumonia ruled out).  Plan for further workup with right and left heart catheterization.   Assessment & Plan:   Principal Problem:   Atrial fibrillation with RVR (HCC) Active Problems:   Type 2 diabetes mellitus without complication (HCC)   Pulmonary embolism (HCC)   Essential hypertension   COPD (chronic obstructive pulmonary disease) (HCC)   CAP (community acquired pneumonia)   OSA on CPAP   Leukocytosis   Acute pulmonary embolism (HCC)   Acute decompensation of chronic diastolic heart failure (right heart failure), in the setting of acute bilateral pulmonary embolism. Impressive diuresis with urine output over last 24 hrs is 8,806 ml. Continue to improve edema lower extremities, systolic blood pressure 115 mmHG.     Continue heart failure management with dogoxin, metoprolol, entresto and spironolactone. Continue diuresis, plan to transition to po in am per heart failure recommendations.    2. Atrial fibrillation. rate control with amiodarone. Continue digoxin. Anticoagulation with heparin in preparation for Cath.   3. Hx of CVA. On aspirin and satatin   4. HTN. Stable blood pressure, continue with metoprolol,  entresto and spironolactone. Diuresis with furosemide.     5. COPD. No exacerbation. Continue with bronchodilator therapy.   6. T2DM. On insulin sliding scale for glucose cover and monitoring.   7. Obesity class 3/ OSA. Calculated BMI is 40,7. On Cpap  8. Diuresis induced hypokalemia. Serum K is down to 3,1 today with bicarbonate at 31 and cr at 1,09,  Continue K correction with KCl, follow renal panel in am.  Avoid hypotension and nephrotoxic medications  Patient continue to be at high risk for worsening heart failure.   Status is: Inpatient  Remains inpatient appropriate because:Inpatient level of care appropriate due to  severity of illness  Dispo: The patient is from: Home              Anticipated d/c is to: Home              Patient currently  is not medically stable to d/c.   Difficult to place patient No   DVT prophylaxis: heparin   Code Status:   full  Family Communication:  No family at the bedside      Consultants:  Cardiology     Subjective: Patient continue to feel better, no nausea or vomiting, feeling very tired and deconditioned, no chest pain. Had palpitations last night   Objective: Vitals:   08/09/20 1933 08/10/20 0616 08/10/20 0805 08/10/20 1209  BP: (!) 129/106 119/85 127/86 115/86  Pulse: (!) 128 (!) 106 93 99  Resp: 20 20 20 19   Temp: 99.5 F (37.5 C) 98.3 F (36.8 C) 99 F (37.2 C) 98.9 F (37.2 C)  TempSrc: Oral Oral Oral Oral  SpO2: 94% 97% 97% 97%  Weight:  128.2 kg    Height:        Intake/Output Summary (Last 24 hours) at 08/10/2020 1501 Last data filed at 08/10/2020 1400 Gross per 24 hour  Intake 2941.15 ml  Output 9210 ml  Net -6268.85 ml   Filed Weights   08/09/20 0527 08/09/20 0528 08/10/20 0616  Weight: 135.3 kg 135.3 kg 128.2 kg    Examination:   General: Not in pain or dyspnea, deconditioned  Neurology: Awake and alert, non focal  E ENT: no pallor, no icterus, oral mucosa moist Cardiovascular: No JVD. S1-S2 present, rhythmic, no gallops, rubs, or murmurs. + pitting bilateral lower extremity edema. Pulmonary: positive breath sounds bilaterally, adequate air movement, no wheezing, rhonchi mild positive rales. Gastrointestinal. Abdomen soft and non tender Skin. No rashes Musculoskeletal: no joint deformities     Data Reviewed: I have personally reviewed following labs and imaging studies  CBC: Recent Labs  Lab 08/06/20 0754 08/07/20 0423 08/08/20 0414 08/09/20 0327 08/10/20 0307  WBC 11.8* 10.2 11.5* 9.7 7.7  HGB 13.6 12.7* 12.7* 12.5* 12.5*  HCT 43.1 39.4 40.4 39.4 37.9*  MCV 85.9 84.9 85.2 84.4 82.6  PLT 240 250 258 254 262   Basic Metabolic Panel: Recent Labs  Lab 08/06/20 0754 08/06/20 1414 08/07/20 0423 08/08/20 0414 08/09/20 0327  08/10/20 0307  NA 134*  --  131* 132* 131* 131*  K 3.7  --  3.8 4.4 3.9 3.1*  CL 99  --  98 97* 96* 88*  CO2 25  --  22 24 26 31   GLUCOSE 149*  --  212* 143* 130* 157*  BUN 8  --  13 16 17 18   CREATININE 1.21  --  1.09 1.18 1.11 1.09  CALCIUM 8.3*  --  8.3* 8.8* 8.5* 8.6*  MG  --  1.6* 1.9 1.8  --  1.6*  PHOS  --   --  3.0 2.6  --   --    GFR: Estimated Creatinine Clearance: 102.2 mL/min (by C-G formula based on SCr of 1.09 mg/dL). Liver Function Tests: No results for input(s): AST, ALT, ALKPHOS, BILITOT, PROT, ALBUMIN in the last 168 hours. No results for input(s): LIPASE, AMYLASE in the last 168 hours. No results for input(s): AMMONIA in the last 168 hours. Coagulation Profile: No results for input(s): INR, PROTIME in the last 168 hours. Cardiac Enzymes: No results for input(s): CKTOTAL, CKMB, CKMBINDEX, TROPONINI in the last 168 hours. BNP (last 3  results) No results for input(s): PROBNP in the last 8760 hours. HbA1C: Recent Labs    08/09/20 2305  HGBA1C 7.2*   CBG: Recent Labs  Lab 08/09/20 0511 08/09/20 1125 08/09/20 1618 08/10/20 0602 08/10/20 1208  GLUCAP 120* 147* 150* 150* 173*   Lipid Profile: No results for input(s): CHOL, HDL, LDLCALC, TRIG, CHOLHDL, LDLDIRECT in the last 72 hours. Thyroid Function Tests: No results for input(s): TSH, T4TOTAL, FREET4, T3FREE, THYROIDAB in the last 72 hours. Anemia Panel: No results for input(s): VITAMINB12, FOLATE, FERRITIN, TIBC, IRON, RETICCTPCT in the last 72 hours.    Radiology Studies: I have reviewed all of the imaging during this hospital visit personally     Scheduled Meds:  Chlorhexidine Gluconate Cloth  6 each Topical Daily   digoxin  0.125 mg Oral Daily   feeding supplement (GLUCERNA SHAKE)  237 mL Oral TID BM   furosemide  80 mg Intravenous BID   guaiFENesin  600 mg Oral BID   insulin aspart  0-9 Units Subcutaneous TID WC    morphine injection  2 mg Intravenous Once   potassium chloride  40 mEq  Oral TID   [START ON 08/11/2020] potassium chloride  40 mEq Oral BID   sacubitril-valsartan  1 tablet Oral BID   sodium chloride flush  3 mL Intravenous Q12H   spironolactone  12.5 mg Oral Daily   Continuous Infusions:  sodium chloride     amiodarone 60 mg/hr (08/10/20 1214)   heparin 2,700 Units/hr (08/10/20 1316)     LOS: 4 days        Hanalei Glace Annett Gula, MD

## 2020-08-10 NOTE — Progress Notes (Signed)
Advanced Heart Failure Rounding Note  PCP-Cardiologist: Dr Rudolpho Sevin   Subjective:   Missed HF follow x2 in March.   7/13 Admitted with chest pain and A fib RVR.  CTA with acute bilateral PE with RV strain, consistent with submassive PE. Started on eliquis 10 mg twice a day. Started on IV lasix. Negative 2.1 liters.  7/14 Started in IV amio and diuresed with IV lasix.  Eliqiuis stopped and started on heparin drip   Remains on IV amio, IV lasix and heparin.   Excellent diuresis. Out 8.8L yesterday on lasix 80 IV bid and metolazone . Weight down 16 pounds. Renal function stable   No SOB or CP.    Objective:   Weight Range: 128.2 kg Body mass index is 38.34 kg/m.   Vital Signs:   Temp:  [98.3 F (36.8 C)-99.5 F (37.5 C)] 98.9 F (37.2 C) (07/16 1209) Pulse Rate:  [93-128] 99 (07/16 1209) Resp:  [19-20] 19 (07/16 1209) BP: (109-129)/(85-106) 115/86 (07/16 1209) SpO2:  [94 %-97 %] 97 % (07/16 1209) Weight:  [128.2 kg] 128.2 kg (07/16 0616) Last BM Date: 08/08/20  Weight change: Filed Weights   08/09/20 0527 08/09/20 0528 08/10/20 0616  Weight: 135.3 kg 135.3 kg 128.2 kg    Intake/Output:   Intake/Output Summary (Last 24 hours) at 08/10/2020 1315 Last data filed at 08/10/2020 1210 Gross per 24 hour  Intake 2224.15 ml  Output 8660 ml  Net -6435.85 ml       Physical Exam    General:  Lying in bed  No resp difficulty HEENT: normal Neck: supple. JVP to jaw. Carotids 2+ bilat; no bruits. No lymphadenopathy or thryomegaly appreciated. Cor: PMI nondisplaced. Irregular rate & rhythm. No rubs, gallops or murmurs. Lungs: clear Abdomen: obese soft, nontender, nondistended. No hepatosplenomegaly. No bruits or masses. Good bowel sounds. Extremities: no cyanosis, clubbing, rash, 1-2+ edema + UNNA Neuro: alert & orientedx3, cranial nerves grossly intact. moves all 4 extremities w/o difficulty. Affect pleasant   Telemetry   AF 90-105 Personally reviewed   EKG     N/A  Labs    CBC Recent Labs    08/09/20 0327 08/10/20 0307  WBC 9.7 7.7  HGB 12.5* 12.5*  HCT 39.4 37.9*  MCV 84.4 82.6  PLT 254 262    Basic Metabolic Panel Recent Labs    35/45/62 0414 08/09/20 0327 08/10/20 0307  NA 132* 131* 131*  K 4.4 3.9 3.1*  CL 97* 96* 88*  CO2 24 26 31   GLUCOSE 143* 130* 157*  BUN 16 17 18   CREATININE 1.18 1.11 1.09  CALCIUM 8.8* 8.5* 8.6*  MG 1.8  --  1.6*  PHOS 2.6  --   --     Liver Function Tests No results for input(s): AST, ALT, ALKPHOS, BILITOT, PROT, ALBUMIN in the last 72 hours. No results for input(s): LIPASE, AMYLASE in the last 72 hours. Cardiac Enzymes No results for input(s): CKTOTAL, CKMB, CKMBINDEX, TROPONINI in the last 72 hours.  BNP: BNP (last 3 results) Recent Labs    03/18/20 1633 08/06/20 0754 08/07/20 0423  BNP 254.6* 442.4* 650.8*     ProBNP (last 3 results) No results for input(s): PROBNP in the last 8760 hours.   D-Dimer No results for input(s): DDIMER in the last 72 hours. Hemoglobin A1C Recent Labs    08/09/20 2305  HGBA1C 7.2*    Fasting Lipid Panel No results for input(s): CHOL, HDL, LDLCALC, TRIG, CHOLHDL, LDLDIRECT in the last 72 hours. Thyroid  Function Tests No results for input(s): TSH, T4TOTAL, T3FREE, THYROIDAB in the last 72 hours.  Invalid input(s): FREET3   Other results:   Imaging    No results found.   Medications:     Scheduled Medications:  Chlorhexidine Gluconate Cloth  6 each Topical Daily   digoxin  0.125 mg Oral Daily   feeding supplement (GLUCERNA SHAKE)  237 mL Oral TID BM   furosemide  80 mg Intravenous BID   guaiFENesin  600 mg Oral BID   insulin aspart  0-9 Units Subcutaneous TID WC    morphine injection  2 mg Intravenous Once   potassium chloride  40 mEq Oral TID   sodium chloride flush  3 mL Intravenous Q12H   spironolactone  12.5 mg Oral Daily    Infusions:  sodium chloride     amiodarone 60 mg/hr (08/10/20 1214)   heparin 2,700  Units/hr (08/10/20 0321)    PRN Medications: sodium chloride, acetaminophen **OR** acetaminophen, docusate sodium, levalbuterol, metoprolol tartrate, polyethylene glycol, sodium chloride flush    Patient Profile    Mr Evetts is a 60 year old with a history of a fib, A fib ablation, CVA 2018, COPD, OSA, HTN, obesity, PE 02/2020, and chronic systolic heart failure.   Admitted with chest pain and A fib RVR.  Assessment/Plan   1. A/C Systolic Heart Failure  Echo 02/2020 EF 40-45%--> ECHO repeat and showed EF down 30-35% with RV moerately reduced, and RA/LA moderately dilated.RA 15.  - Plan for cath on Monday.  - Diuresing very well. Still volume overloaded. Continue IV lasix one more day  - Continue spiro 12.5 daily  - Continue digoxin 0.125 mg daily.  - Add Entresto 24/26 bid - No b-blocker yet - Renal function stable.  - Continue unna boots.  - R/L cath Monday   2. Recurrent PE -  H/O PE back in February 2022. He was not taking eliquis.  - CTA this admit  --> Now with acute bilateral submassive PE.  - L/E [place] negative -> No indication for filter placement. --negative for DVT.  - Eliqiuis stopped and started on heparin drip pending cath   3  . A fib RVR  -Had DC-CV 03/12/20  - Admitted witth recurrent A fib RVR. 7/14  - Now on amio gtt. Rate improved - Eliqiuis stopped and started on heparin drip  - Once diuresed if he remains in A fib will need TEE/DC-CV.   4. OSA On CPAP.   5. Hypokalemia - will supp  6. Obesity Body mass index is 38.34 kg/m.  7.  H/O embolic CVA   8. SDOH needs - He has been referred to HF Paramedicine program.  Consult cardiac rehab.  - Medication concerns reviewed with patient and pharmacy team. Barriers identified: Yes med complicance. Will need referral to HF Paramedicine. Lives in [place]. Of note he cancelled his last 2 appointments in HF clinic back in March.    Length of Stay: 4  Arvilla Meres, MD  08/10/2020, 1:15  PM  Advanced Heart Failure Team Pager [phone] (M-F; 7a - 5p)  Please contact [facility] for night-coverage after hours (5p -7a ) and weekends on [email or web address]

## 2020-08-11 DIAGNOSIS — G4733 Obstructive sleep apnea (adult) (pediatric): Secondary | ICD-10-CM | POA: Diagnosis not present

## 2020-08-11 DIAGNOSIS — E119 Type 2 diabetes mellitus without complications: Secondary | ICD-10-CM | POA: Diagnosis not present

## 2020-08-11 DIAGNOSIS — I2699 Other pulmonary embolism without acute cor pulmonale: Secondary | ICD-10-CM | POA: Diagnosis not present

## 2020-08-11 DIAGNOSIS — I10 Essential (primary) hypertension: Secondary | ICD-10-CM | POA: Diagnosis not present

## 2020-08-11 DIAGNOSIS — J449 Chronic obstructive pulmonary disease, unspecified: Secondary | ICD-10-CM | POA: Diagnosis not present

## 2020-08-11 DIAGNOSIS — I4891 Unspecified atrial fibrillation: Secondary | ICD-10-CM | POA: Diagnosis not present

## 2020-08-11 LAB — CBC
HCT: 41.5 % (ref 39.0–52.0)
Hemoglobin: 13.5 g/dL (ref 13.0–17.0)
MCH: 26.8 pg (ref 26.0–34.0)
MCHC: 32.5 g/dL (ref 30.0–36.0)
MCV: 82.3 fL (ref 80.0–100.0)
Platelets: 288 10*3/uL (ref 150–400)
RBC: 5.04 MIL/uL (ref 4.22–5.81)
RDW: 15.3 % (ref 11.5–15.5)
WBC: 7.7 10*3/uL (ref 4.0–10.5)
nRBC: 0 % (ref 0.0–0.2)

## 2020-08-11 LAB — CULTURE, BLOOD (ROUTINE X 2)
Culture: NO GROWTH
Culture: NO GROWTH
Special Requests: ADEQUATE
Special Requests: ADEQUATE

## 2020-08-11 LAB — HEPARIN LEVEL (UNFRACTIONATED): Heparin Unfractionated: 0.4 IU/mL (ref 0.30–0.70)

## 2020-08-11 LAB — BASIC METABOLIC PANEL
Anion gap: 11 (ref 5–15)
BUN: 15 mg/dL (ref 6–20)
CO2: 33 mmol/L — ABNORMAL HIGH (ref 22–32)
Calcium: 8.5 mg/dL — ABNORMAL LOW (ref 8.9–10.3)
Chloride: 82 mmol/L — ABNORMAL LOW (ref 98–111)
Creatinine, Ser: 0.97 mg/dL (ref 0.61–1.24)
GFR, Estimated: >60 mL/min (ref >60)
Glucose, Bld: 158 mg/dL — ABNORMAL HIGH (ref 70–99)
Potassium: 3.6 mmol/L (ref 3.5–5.1)
Sodium: 126 mmol/L — ABNORMAL LOW (ref 135–145)

## 2020-08-11 LAB — MAGNESIUM: Magnesium: 1.9 mg/dL (ref 1.7–2.4)

## 2020-08-11 LAB — GLUCOSE, CAPILLARY
Glucose-Capillary: 173 mg/dL — ABNORMAL HIGH (ref 70–99)
Glucose-Capillary: 202 mg/dL — ABNORMAL HIGH (ref 70–99)
Glucose-Capillary: 167 mg/dL — ABNORMAL HIGH (ref 70–99)
Glucose-Capillary: 162 mg/dL — ABNORMAL HIGH (ref 70–99)

## 2020-08-11 LAB — APTT
aPTT: 65 seconds — ABNORMAL HIGH (ref 24–36)
aPTT: 47 seconds — ABNORMAL HIGH (ref 24–36)
aPTT: 64 seconds — ABNORMAL HIGH (ref 24–36)

## 2020-08-11 MED ORDER — MAGNESIUM SULFATE 2 GM/50ML IV SOLN
2.0000 g | Freq: Once | INTRAVENOUS | Status: AC
  Administered 2020-08-11: 2 g via INTRAVENOUS
  Filled 2020-08-11: qty 50

## 2020-08-11 MED ORDER — ASPIRIN 81 MG PO CHEW
81.0000 mg | CHEWABLE_TABLET | ORAL | Status: AC
  Administered 2020-08-12: 81 mg via ORAL
  Filled 2020-08-11: qty 1

## 2020-08-11 MED ORDER — SODIUM CHLORIDE 0.9 % IV SOLN: INTRAVENOUS | Status: DC

## 2020-08-11 MED ORDER — ZOLPIDEM TARTRATE 5 MG PO TABS
10.0000 mg | ORAL_TABLET | Freq: Every evening | ORAL | Status: DC | PRN
  Administered 2020-08-11: 10 mg via ORAL
  Filled 2020-08-11 (×2): qty 2

## 2020-08-11 MED ORDER — SODIUM CHLORIDE 0.9 % IV SOLN: 250.0000 mL | INTRAVENOUS | Status: DC | PRN

## 2020-08-11 MED ORDER — SODIUM CHLORIDE 0.9% FLUSH: 3.0000 mL | INTRAVENOUS | Status: DC | PRN

## 2020-08-11 NOTE — Progress Notes (Signed)
PROGRESS NOTE    Donald [name]  ZLD:[id number] DOB: February 22, 1960 DOA: 08/06/2020 PCP: Renaye Rakers, MD    Brief Narrative:  Donald [name] was admitted to the hospital with the working diagnosis of acute on chronic right heart failure due to recurrent pulmonary embolism.    60 year old male past medical history for hypertension, COPD, atrial fibrillation, systolic heart failure, pulmonary embolism, CVA, dyslipidemia who presented with chest pain and palpitations.  He had atrial flutter ablation 2019 and cardioversion in 02/22.  He reported 24 hours of severe palpitations, associated with chest pain and dyspnea.  On his initial physical examination his blood pressure was 97/67, heart rate 118, respirate 29, temperature 98.1, oxygen saturation 91%, heart S1-S2, present, irregularly irregular, lungs with rales at the left lower lobe, no wheezing, positive increased work of breathing, soft abdomen, positive lower extremity edema bilaterally.   Sodium 134, potassium 3.7, chloride 99, bicarb 25, glucose 149, BUN 8, creatinine 1.31, BNP 442, high sensitive troponin 20-21, white count 11.8, hematocrit 13.6, hematocrit 43.1, platelets 240. SARS COVID-19 negative.   Chest radiograph with cardiomegaly, hilar vascular congestion, right base atelectasis.   EKG with 134 bpm, normal axis, normal QTC, atrial fibrillation rhythm, no ST segment T wave changes.   Chest CT with bilateral pulm embolism involving lobar pulmonary arteries and distal on the right and segmental pulmonary arteries on the left.  Positive for acute right heart strain.  Small right pleural effusion with atelectasis.  Right middle lobe groundglass opacity, likely pulmonary hemorrhage/ infarct.   Patient was placed on anticoagulation and admitted to the intensive care unit.   Remained hemodynamically stable, transfer to [facility] on 07/14.   Responding well to diuresis.  Pulmonary infarct, no signs of infection, continue to hold on antibiotic  therapy (pneumonia ruled out).   Plan for further workup with right and left heart catheterization.    Assessment & Plan:   Principal Problem:   Atrial fibrillation with RVR (HCC) Active Problems:   Type 2 diabetes mellitus without complication (HCC)   Pulmonary embolism (HCC)   Essential hypertension   COPD (chronic obstructive pulmonary disease) (HCC)   CAP (community acquired pneumonia)   OSA on CPAP   Leukocytosis   Acute pulmonary embolism (HCC)   Acute decompensation of chronic diastolic heart failure (right heart failure), in the setting of acute bilateral pulmonary embolism. His urine output over last 24 hrs is 6,763 ml. Improving edema lower extremities, systolic blood pressure down to 98/68, warm lower extremities.   Medical management with dogoxin, metoprolol, entresto and spironolactone. Continue furosemide for hypervolemia.    2. Atrial fibrillation. Continue with amiodarone, with digoxin, heart rate has been 70 to 110.  Continue anticoagulation with heparin in preparation for Cath tomorrow.    3. Hx of CVA. Continue with aspirin and satatin   4. HTN. On metoprolol,  entresto and spironolactone. Continue with furosemide.   Blood pressure with systolic 96 mmHg this pm.    5. COPD. No signs of acute exacerbation. Continue with bronchodilator therapy.   6. T2DM. Continue with insulin sliding scale for glucose cover and monitoring.   7. Obesity class 3/ OSA. Calculated BMI is 40,7. Continue with Cpap   8. Diuresis induced hypokalemia./ hypoNatremia.  Serum Na is down to 126 today, with K of 3,6 and serum cr at 0,97. Bicarbonate 33. Mg 1,9 Continue K correction with Kcl, add 2 g Mag sulfate. Target K at 4 and Mg at 2.      Patient continue to  be at high risk for worsening heart failure   Status is: Inpatient  Remains inpatient appropriate because:Inpatient level of care appropriate due to severity of illness  Dispo: The patient is from: Home               Anticipated d/c is to: Home              Patient currently is not medically stable to d/c.   Difficult to place patient No   DVT prophylaxis: Heparin   Code Status:   full  Family Communication:   No family at the bedside      Consultants:  Cardiology    Subjective: Patient continue to improve dyspnea and edema, no nausea or vomiting and tolerating po well.   Objective: Vitals:   08/10/20 2013 08/10/20 2316 08/11/20 0500 08/11/20 0638  BP: (!) 121/101 112/84  (!) 120/94  Pulse: 87 83  (!) 110  Resp: 20 18  18   Temp: (!) 97.4 F (36.3 C) 97.9 F (36.6 C)  98.2 F (36.8 C)  TempSrc: Oral Oral  Oral  SpO2: 100% 93%  93%  Weight:   124.2 kg   Height:        Intake/Output Summary (Last 24 hours) at 08/11/2020 1155 Last data filed at 08/11/2020 0629 Gross per 24 hour  Intake 1191 ml  Output 5003 ml  Net -3812 ml   Filed Weights   08/09/20 0528 08/10/20 0616 08/11/20 0500  Weight: 135.3 kg 128.2 kg 124.2 kg    Examination:   General: Not in pain or dyspnea. Deconditioned  Neurology: Awake and alert, non focal  E ENT: no pallor, no icterus, oral mucosa moist Cardiovascular: No JVD. S1-S2 present, rhythmic, no gallops, rubs, or murmurs. No lower extremity edema. Pulmonary: positive breath sounds bilaterally,  no wheezing, or rhonchi, positive bibasilar rales. Gastrointestinal. Abdomen soft and non tender Skin. No rashes Musculoskeletal: no joint deformities     Data Reviewed: I have personally reviewed following labs and imaging studies  CBC: Recent Labs  Lab 08/07/20 0423 08/08/20 0414 08/09/20 0327 08/10/20 0307 08/11/20 0243  WBC 10.2 11.5* 9.7 7.7 7.7  HGB 12.7* 12.7* 12.5* 12.5* 13.5  HCT 39.4 40.4 39.4 37.9* 41.5  MCV 84.9 85.2 84.4 82.6 82.3  PLT 250 258 254 262 288   Basic Metabolic Panel: Recent Labs  Lab 08/06/20 1414 08/07/20 0423 08/08/20 0414 08/09/20 0327 08/10/20 0307 08/11/20 0243  NA  --  131* 132* 131* 131* 126*  K  --   3.8 4.4 3.9 3.1* 3.6  CL  --  98 97* 96* 88* 82*  CO2  --  22 24 26 31  33*  GLUCOSE  --  212* 143* 130* 157* 158*  BUN  --  13 16 17 18 15   CREATININE  --  1.09 1.18 1.11 1.09 0.97  CALCIUM  --  8.3* 8.8* 8.5* 8.6* 8.5*  MG 1.6* 1.9 1.8  --  1.6* 1.9  PHOS  --  3.0 2.6  --   --   --    GFR: Estimated Creatinine Clearance: 112.9 mL/min (by C-G formula based on SCr of 0.97 mg/dL). Liver Function Tests: No results for input(s): AST, ALT, ALKPHOS, BILITOT, PROT, ALBUMIN in the last 168 hours. No results for input(s): LIPASE, AMYLASE in the last 168 hours. No results for input(s): AMMONIA in the last 168 hours. Coagulation Profile: No results for input(s): INR, PROTIME in the last 168 hours. Cardiac Enzymes: No results for input(s): CKTOTAL,  CKMB, CKMBINDEX, TROPONINI in the last 168 hours. BNP (last 3 results) No results for input(s): PROBNP in the last 8760 hours. HbA1C: Recent Labs    08/09/20 2305  HGBA1C 7.2*   CBG: Recent Labs  Lab 08/10/20 0602 08/10/20 1208 08/10/20 1636 08/10/20 2046 08/11/20 0555  GLUCAP 150* 173* 135* 183* 173*   Lipid Profile: No results for input(s): CHOL, HDL, LDLCALC, TRIG, CHOLHDL, LDLDIRECT in the last 72 hours. Thyroid Function Tests: No results for input(s): TSH, T4TOTAL, FREET4, T3FREE, THYROIDAB in the last 72 hours. Anemia Panel: No results for input(s): VITAMINB12, FOLATE, FERRITIN, TIBC, IRON, RETICCTPCT in the last 72 hours.    Radiology Studies: I have reviewed all of the imaging during this hospital visit personally     Scheduled Meds:  Chlorhexidine Gluconate Cloth  6 each Topical Daily   digoxin  0.125 mg Oral Daily   feeding supplement (GLUCERNA SHAKE)  237 mL Oral TID BM   furosemide  80 mg Intravenous BID   guaiFENesin  600 mg Oral BID   insulin aspart  0-9 Units Subcutaneous TID WC    morphine injection  2 mg Intravenous Once   potassium chloride  40 mEq Oral BID   sacubitril-valsartan  1 tablet Oral BID    sodium chloride flush  3 mL Intravenous Q12H   spironolactone  12.5 mg Oral Daily   Continuous Infusions:  sodium chloride     amiodarone 60 mg/hr (08/11/20 1109)   heparin 2,800 Units/hr (08/11/20 0836)   magnesium sulfate bolus IVPB       LOS: 5 days        Terie Lear Annett Gula, MD

## 2020-08-11 NOTE — Progress Notes (Signed)
Pt refuses am medicine vitals and assessment he would "like to rest"

## 2020-08-11 NOTE — Progress Notes (Signed)
Pt states he will place himself on CPAP. ?

## 2020-08-11 NOTE — Progress Notes (Signed)
ANTICOAGULATION CONSULT NOTE  Pharmacy Consult for IV Heparin Indication: pulmonary embolus  Allergies  Allergen Reactions   Lisinopril Cough    Patient Measurements: Height: 6' (182.9 cm) Weight: 124.2 kg (273 lb 14.4 oz) IBW/kg (Calculated) : 77.6 Heparin Dosing Weight: 108 kg  Vital Signs: Temp: 98.5 F (36.9 C) (07/17 1700) Temp Source: Oral (07/17 1700) BP: 123/87 (07/17 1700) Pulse Rate: 90 (07/17 1700)  Labs: Recent Labs    08/09/20 0327 08/09/20 1302 08/09/20 2048 08/09/20 2137 08/09/20 2301 08/10/20 0307 08/11/20 0243 08/11/20 1131 08/11/20 2043  HGB 12.5*  --   --   --   --  12.5* 13.5  --   --   HCT 39.4  --   --   --   --  37.9* 41.5  --   --   PLT 254  --   --   --   --  262 288  --   --   APTT 49* 57*   < >  --   --  70* 65* 47* 64*  HEPARINUNFRC >1.10* >1.10*  --   --   --  0.68 0.40  --   --   CREATININE 1.11  --   --   --   --  1.09 0.97  --   --   TROPONINIHS  --   --   --  14 16 14   --   --   --    < > = values in this interval not displayed.    Estimated Creatinine Clearance: 112.9 mL/min (by C-G formula based on SCr of 0.97 mg/dL).  Medical History: Past Medical History:  Diagnosis Date   Arrhythmia    A fib    Atrial fibrillation (HCC)    CHF (congestive heart failure) (HCC)    COPD (chronic obstructive pulmonary disease) (HCC)    Hypertension    Sleep apnea    Stroke [facility])     Assessment: 60 yr old man man with PMHx significant for HTN, OSA (on CPAP), COPD, afib (S/P cardioversion 03/12/2020) and aflutter (S/P R-sided ablation 2019), PE (02/2020, on apixaban PTA, last dose 7/11 evening), CVA (2018). Pt presented with chest discomfort, palpitations and LE swelling. CT: bilateral submassive PE with RHS; Dopplers: no evidence of DVT.  Pt was started on IV heparin infusion on admission (last infusion rate was 2300 units/hr), then switched to apixaban on 7/13; last dose apixaban 10 mg was at 0924 AM today). Pt's anticoagulation swithced  back to IV heparin in anticipation of R/L heart cath. Given recent apixaban exposure, will monitor anticoagulation using aPTT until aPTT and heparin levels correlate.  aPTT slightly subtherapeutic (64 sec) on heparin at 2800 units/hr. Had lost IV access earlier - restarted without issues per nursing. No s/sx of bleeding.   Goal of Therapy:  Heparin level 0.3-0.7 units/ml  aPTT:  66-102 sec Monitor platelets by anticoagulation protocol: Yes   Plan:  Increase IV heparin at 2900 units/hr  Monitor daily HL/aPTT, CBC, and for s/sx of bleeding  8/13, PharmD, BCCCP Clinical Pharmacist  Phone: [phone] 08/11/2020 9:49 PM  Please check AMION for all [facility] Pharmacy phone numbers After 10:00 PM, call Main Pharmacy [phone]

## 2020-08-11 NOTE — Progress Notes (Signed)
ANTICOAGULATION CONSULT NOTE  Pharmacy Consult for IV Heparin Indication: pulmonary embolus  Allergies  Allergen Reactions   Lisinopril Cough    Patient Measurements: Height: 6' (182.9 cm) Weight: 124.2 kg (273 lb 14.4 oz) IBW/kg (Calculated) : 77.6 Heparin Dosing Weight: 108 kg  Vital Signs: Temp: 98.2 F (36.8 C) (07/17 0638) Temp Source: Oral (07/17 1660) BP: 96/68 (07/17 1100) Pulse Rate: 72 (07/17 1100)  Labs: Recent Labs    08/09/20 0327 08/09/20 1302 08/09/20 2048 08/09/20 2137 08/09/20 2301 08/10/20 0307 08/11/20 0243 08/11/20 1131  HGB 12.5*  --   --   --   --  12.5* 13.5  --   HCT 39.4  --   --   --   --  37.9* 41.5  --   PLT 254  --   --   --   --  262 288  --   APTT 49* 57*   < >  --   --  70* 65* 47*  HEPARINUNFRC >1.10* >1.10*  --   --   --  0.68 0.40  --   CREATININE 1.11  --   --   --   --  1.09 0.97  --   TROPONINIHS  --   --   --  14 16 14   --   --    < > = values in this interval not displayed.    Estimated Creatinine Clearance: 112.9 mL/min (by C-G formula based on SCr of 0.97 mg/dL).  Medical History: Past Medical History:  Diagnosis Date   Arrhythmia    A fib    Atrial fibrillation (HCC)    CHF (congestive heart failure) (HCC)    COPD (chronic obstructive pulmonary disease) (HCC)    Hypertension    Sleep apnea    Stroke [facility])     Assessment: 60 yr old man man with PMHx significant for HTN, OSA (on CPAP), COPD, afib (S/P cardioversion 03/12/2020) and aflutter (S/P R-sided ablation 2019), PE (02/2020, on apixaban PTA, last dose 7/11 evening), CVA (2018). Pt presented with chest discomfort, palpitations and LE swelling. CT: bilateral submassive PE with RHS; Dopplers: no evidence of DVT.  Pt was started on IV heparin infusion on admission (last infusion rate was 2300 units/hr), then switched to apixaban on 7/13; last dose apixaban 10 mg was at 0924 AM today). Pt's anticoagulation swithced back to IV heparin in anticipation of R/L heart  cath. Given recent apixaban exposure, will monitor anticoagulation using aPTT until aPTT and heparin levels correlate.  aPTT slightly subtherapeutic (65 sec) on heparin at 2700 units/hr > increased earlier this am to 2800 uts/hr Aptt back at 47sec - drawn during time that patient had lost IV access Heparin just resumed and will re-time follow up level   Goal of Therapy:  Heparin level 0.3-0.7 units/ml  aPTT:  66-102 sec Monitor platelets by anticoagulation protocol: Yes   Plan:  Continue IV heparin at  2800 units/hr  F/u 8 hr aPTT to confirm therapeutic   8/13 Pharm.D. CPP, BCPS Clinical Pharmacist [phone] 08/11/2020 2:44 PM

## 2020-08-11 NOTE — Progress Notes (Signed)
OT Cancellation Note  Patient Details Name: ADRIAAN MALTESE MRN: [id number] DOB: 1961-01-19   Cancelled Treatment:    Reason Eval/Treat Not Completed: Patient at procedure or test/ unavailable;Other (comment) IV team present, will check back as time allows for OT session.  Pollyann Glen K., COTA/L Acute Rehabilitation Services [phone]   Barron Schmid 08/11/2020, 1:56 PM

## 2020-08-11 NOTE — Progress Notes (Signed)
ANTICOAGULATION CONSULT NOTE  Pharmacy Consult for IV Heparin Indication: pulmonary embolus  Allergies  Allergen Reactions   Lisinopril Cough    Patient Measurements: Height: 6' (182.9 cm) Weight: 124.2 kg (273 lb 14.4 oz) IBW/kg (Calculated) : 77.6 Heparin Dosing Weight: 108 kg  Vital Signs: Temp: 97.9 F (36.6 C) (07/16 2316) Temp Source: Oral (07/16 2316) BP: 112/84 (07/16 2316) Pulse Rate: 83 (07/16 2316)  Labs: Recent Labs    08/09/20 0327 08/09/20 1302 08/09/20 2048 08/09/20 2137 08/09/20 2301 08/10/20 0307 08/11/20 0243  HGB 12.5*  --   --   --   --  12.5* 13.5  HCT 39.4  --   --   --   --  37.9* 41.5  PLT 254  --   --   --   --  262 288  APTT 49* 57* 66*  --   --  70* 65*  HEPARINUNFRC >1.10* >1.10*  --   --   --  0.68 0.40  CREATININE 1.11  --   --   --   --  1.09 0.97  TROPONINIHS  --   --   --  14 16 14   --     Estimated Creatinine Clearance: 112.9 mL/min (by C-G formula based on SCr of 0.97 mg/dL).  Medical History: Past Medical History:  Diagnosis Date   Arrhythmia    A fib    Atrial fibrillation (HCC)    CHF (congestive heart failure) (HCC)    COPD (chronic obstructive pulmonary disease) (HCC)    Hypertension    Sleep apnea    Stroke [facility])     Assessment: 60 yr old man man with PMHx significant for HTN, OSA (on CPAP), COPD, afib (S/P cardioversion 03/12/2020) and aflutter (S/P R-sided ablation 2019), PE (02/2020, on apixaban PTA, last dose 7/11 evening), CVA (2018). Pt presented with chest discomfort, palpitations and LE swelling. CT: bilateral submassive PE with RHS; Dopplers: no evidence of DVT.  Pt was started on IV heparin infusion on admission (last infusion rate was 2300 units/hr), then switched to apixaban on 7/13; last dose apixaban 10 mg was at 0924 AM today). Pt's anticoagulation swithced back to IV heparin in anticipation of R/L heart cath. Given recent apixaban exposure, will monitor anticoagulation using aPTT until aPTT and heparin  levels correlate.  aPTT slightly subtherapeutic (65 sec) on heparin at 2700 units/hr, Heparin level 0.4 (affected by apixaban). Almost correlating.  Goal of Therapy:  Heparin level 0.3-0.7 units/ml  aPTT:  66-102 sec Monitor platelets by anticoagulation protocol: Yes   Plan:  Increase IV heparin to 2800 units/hr  F/u 8 hr aPTT to confirm therapeutic  8/13, PharmD, BCPS Please see amion for complete clinical pharmacist phone list  08/11/2020 3:42 AM

## 2020-08-11 NOTE — Progress Notes (Signed)
Advanced Heart Failure Rounding Note  PCP-Cardiologist: Dr Rudolpho Sevin   Subjective:    7/13 Admitted with chest pain and A fib RVR.  CTA with acute bilateral PE with RV strain, consistent with submassive PE. Started on eliquis 10 mg twice a day. Started on IV lasix. Negative 2.1 liters.  7/14 Started in IV amio and diuresed with IV lasix.  Eliqiuis stopped and started on heparin drip   Remains on IV amio, IV lasix and heparin.   Continues to diurese very briskly. Out 6.7L yesterday on lasix 80 IV bid. Weight down another 9 pounds. (29 pounds total) Renal function stable  SNa 131 -> 126  Denies CP or SOB.   Remains in AF but improved rate control on IV amio. No bleeding on IV heparin.   Refused vitals this am. [name] he wanted to rest.    Objective:   Weight Range: 124.2 kg Body mass index is 37.15 kg/m.   Vital Signs:   Temp:  [97.4 F (36.3 C)-98.9 F (37.2 C)] 98.2 F (36.8 C) (07/17 0102) Pulse Rate:  [83-122] 110 (07/17 0638) Resp:  [18-20] 18 (07/17 0638) BP: (112-121)/(84-101) 120/94 (07/17 0638) SpO2:  [93 %-100 %] 93 % (07/17 7253) Weight:  [124.2 kg] 124.2 kg (07/17 0500) Last BM Date: 08/08/20  Weight change: Filed Weights   08/09/20 0528 08/10/20 0616 08/11/20 0500  Weight: 135.3 kg 128.2 kg 124.2 kg    Intake/Output:   Intake/Output Summary (Last 24 hours) at 08/11/2020 1200 Last data filed at 08/11/2020 0629 Gross per 24 hour  Intake 1191 ml  Output 5003 ml  Net -3812 ml       Physical Exam    General:  Lying in bed  No resp difficulty HEENT: normal Neck: supple. JVP to jaw  Carotids 2+ bilat; no bruits. No lymphadenopathy or thryomegaly appreciated. Cor: PMI nondisplaced. Irregular rate & rhythm. No rubs, gallops or murmurs. Lungs: clear Abdomen: soft, nontender, nondistended. No hepatosplenomegaly. No bruits or masses. Good bowel sounds. Extremities: no cyanosis, clubbing, rash, 1-2+ edema + unna  Neuro: alert & orientedx3, cranial nerves  grossly intact. moves all 4 extremities w/o difficulty. Affect pleasant    Telemetry   AF 80-110  Personally reviewed  Labs    CBC Recent Labs    08/10/20 0307 08/11/20 0243  WBC 7.7 7.7  HGB 12.5* 13.5  HCT 37.9* 41.5  MCV 82.6 82.3  PLT 262 288    Basic Metabolic Panel Recent Labs    66/44/03 0307 08/11/20 0243  NA 131* 126*  K 3.1* 3.6  CL 88* 82*  CO2 31 33*  GLUCOSE 157* 158*  BUN 18 15  CREATININE 1.09 0.97  CALCIUM 8.6* 8.5*  MG 1.6* 1.9    Liver Function Tests No results for input(s): AST, ALT, ALKPHOS, BILITOT, PROT, ALBUMIN in the last 72 hours. No results for input(s): LIPASE, AMYLASE in the last 72 hours. Cardiac Enzymes No results for input(s): CKTOTAL, CKMB, CKMBINDEX, TROPONINI in the last 72 hours.  BNP: BNP (last 3 results) Recent Labs    03/18/20 1633 08/06/20 0754 08/07/20 0423  BNP 254.6* 442.4* 650.8*     ProBNP (last 3 results) No results for input(s): PROBNP in the last 8760 hours.   D-Dimer No results for input(s): DDIMER in the last 72 hours. Hemoglobin A1C Recent Labs    08/09/20 2305  HGBA1C 7.2*    Fasting Lipid Panel No results for input(s): CHOL, HDL, LDLCALC, TRIG, CHOLHDL, LDLDIRECT in the last  72 hours. Thyroid Function Tests No results for input(s): TSH, T4TOTAL, T3FREE, THYROIDAB in the last 72 hours.  Invalid input(s): FREET3   Other results:   Imaging    No results found.   Medications:     Scheduled Medications:  Chlorhexidine Gluconate Cloth  6 each Topical Daily   digoxin  0.125 mg Oral Daily   feeding supplement (GLUCERNA SHAKE)  237 mL Oral TID BM   furosemide  80 mg Intravenous BID   guaiFENesin  600 mg Oral BID   insulin aspart  0-9 Units Subcutaneous TID WC    morphine injection  2 mg Intravenous Once   potassium chloride  40 mEq Oral BID   sacubitril-valsartan  1 tablet Oral BID   sodium chloride flush  3 mL Intravenous Q12H   spironolactone  12.5 mg Oral Daily     Infusions:  sodium chloride     amiodarone 60 mg/hr (08/11/20 1109)   heparin 2,800 Units/hr (08/11/20 0836)   magnesium sulfate bolus IVPB      PRN Medications: sodium chloride, acetaminophen **OR** acetaminophen, cyclobenzaprine, docusate sodium, levalbuterol, metoprolol tartrate, polyethylene glycol, sodium chloride flush, zolpidem    Patient Profile    Donald [name] is a 60 year old with a history of a fib, A fib ablation, CVA 2018, COPD, OSA, HTN, obesity, PE 02/2020, and chronic systolic heart failure.   Admitted with chest pain and A fib RVR.  Assessment/Plan   1. A/C Systolic Heart Failure  Echo 02/2020 EF 40-45%--> ECHO repeat and showed EF down 30-35% with RV moerately reduced, and RA/LA moderately dilated.RA 15.  - Diuresing very well. Weight down 29 pounds. Still volume overloaded. Continue IV lasix. Supp K  - Continue spiro 12.5 daily  - Continue digoxin 0.125 mg daily.  - Continue Entresto 24/26 bid - No b-blocker yet - Renal function stable. SCr 0.97 - Continue unna boots.  - R/L cath Monday   2. Recurrent PE -  H/O PE back in February 2022. He was not taking eliquis.  - CTA this admit  --> Now with acute bilateral submassive PE.  - L/E [place] negative -> No indication for filter placement. --negative for DVT.  - Eliqiuis stopped and started on heparin drip pending cath. No bleeding on heparin. HL 0.40. D/w pharmD,   3  . A fib RVR  -Had DC-CV 03/12/20  - Admitted witth recurrent A fib RVR. 7/14  - Now on amio gtt. Rate improved - Eliqiuis stopped and started on heparin drip  - Once diuresed if he remains in A fib will need TEE/DC-CV.  - No bleeding on heparin. HL 0.40. D/w pharmD,   4. OSA On CPAP.   5. Hypokalemia - K 3.6. will supp  6. Obesity Body mass index is 37.15 kg/m.  7.  H/O embolic CVA   8. SDOH needs - He has been referred to HF Paramedicine program.  Consult cardiac rehab.  - Medication concerns reviewed with patient and pharmacy team.  Barriers identified: Yes med complicance. Will need referral to HF Paramedicine. Lives in [place]. Of note he cancelled his last 2 appointments in HF clinic back in March.    Length of Stay: 5  Arvilla Meres, MD  08/11/2020, 12:00 PM  Advanced Heart Failure Team Pager [phone] (M-F; 7a - 5p)  Please contact [facility] for night-coverage after hours (5p -7a ) and weekends on [email or web address]

## 2020-08-12 ENCOUNTER — Encounter (HOSPITAL_COMMUNITY): Payer: Self-pay | Admitting: Internal Medicine

## 2020-08-12 ENCOUNTER — Encounter (HOSPITAL_COMMUNITY)
Admission: EM | Disposition: A | Discharge status: Home / Self Care | Payer: Self-pay | Source: Home / Self Care | Attending: Internal Medicine

## 2020-08-12 DIAGNOSIS — I2510 Atherosclerotic heart disease of native coronary artery without angina pectoris: Secondary | ICD-10-CM | POA: Diagnosis not present

## 2020-08-12 DIAGNOSIS — I4891 Unspecified atrial fibrillation: Secondary | ICD-10-CM | POA: Diagnosis not present

## 2020-08-12 DIAGNOSIS — I10 Essential (primary) hypertension: Secondary | ICD-10-CM | POA: Diagnosis not present

## 2020-08-12 DIAGNOSIS — J449 Chronic obstructive pulmonary disease, unspecified: Secondary | ICD-10-CM | POA: Diagnosis not present

## 2020-08-12 DIAGNOSIS — I2609 Other pulmonary embolism with acute cor pulmonale: Secondary | ICD-10-CM | POA: Diagnosis not present

## 2020-08-12 DIAGNOSIS — I5023 Acute on chronic systolic (congestive) heart failure: Secondary | ICD-10-CM | POA: Diagnosis not present

## 2020-08-12 HISTORY — PX: RIGHT/LEFT HEART CATH AND CORONARY ANGIOGRAPHY: CATH118266

## 2020-08-12 LAB — CBC
HCT: 45.1 % (ref 39.0–52.0)
Hemoglobin: 14.5 g/dL (ref 13.0–17.0)
MCH: 26.6 pg (ref 26.0–34.0)
MCHC: 32.2 g/dL (ref 30.0–36.0)
MCV: 82.6 fL (ref 80.0–100.0)
Platelets: 293 10*3/uL (ref 150–400)
RBC: 5.46 MIL/uL (ref 4.22–5.81)
RDW: 15.6 % — ABNORMAL HIGH (ref 11.5–15.5)
WBC: 8 10*3/uL (ref 4.0–10.5)
nRBC: 0 % (ref 0.0–0.2)

## 2020-08-12 LAB — POCT I-STAT 7, (LYTES, BLD GAS, ICA,H+H)
Acid-Base Excess: 10 mmol/L — ABNORMAL HIGH (ref 0.0–2.0)
Acid-Base Excess: 6 mmol/L — ABNORMAL HIGH (ref 0.0–2.0)
Acid-Base Excess: 7 mmol/L — ABNORMAL HIGH (ref 0.0–2.0)
Bicarbonate: 30.5 mmol/L — ABNORMAL HIGH (ref 20.0–28.0)
Bicarbonate: 30.9 mmol/L — ABNORMAL HIGH (ref 20.0–28.0)
Bicarbonate: 36.1 mmol/L — ABNORMAL HIGH (ref 20.0–28.0)
Calcium, Ion: 0.78 mmol/L — CL (ref 1.15–1.40)
Calcium, Ion: 0.86 mmol/L — CL (ref 1.15–1.40)
Calcium, Ion: 1.05 mmol/L — ABNORMAL LOW (ref 1.15–1.40)
HCT: 40 % (ref 39.0–52.0)
HCT: 41 % (ref 39.0–52.0)
HCT: 46 % (ref 39.0–52.0)
Hemoglobin: 13.6 g/dL (ref 13.0–17.0)
Hemoglobin: 13.9 g/dL (ref 13.0–17.0)
Hemoglobin: 15.6 g/dL (ref 13.0–17.0)
O2 Saturation: 65 %
O2 Saturation: 69 %
O2 Saturation: 97 %
Potassium: 2.9 mmol/L — ABNORMAL LOW (ref 3.5–5.1)
Potassium: 3.1 mmol/L — ABNORMAL LOW (ref 3.5–5.1)
Potassium: 3.6 mmol/L (ref 3.5–5.1)
Sodium: 135 mmol/L (ref 135–145)
Sodium: 139 mmol/L (ref 135–145)
Sodium: 141 mmol/L (ref 135–145)
TCO2: 32 mmol/L (ref 22–32)
TCO2: 32 mmol/L (ref 22–32)
TCO2: 38 mmol/L — ABNORMAL HIGH (ref 22–32)
pCO2 arterial: 40.9 mmHg (ref 32.0–48.0)
pCO2 arterial: 44.2 mmHg (ref 32.0–48.0)
pCO2 arterial: 50.3 mmHg — ABNORMAL HIGH (ref 32.0–48.0)
pH, Arterial: 7.447 (ref 7.350–7.450)
pH, Arterial: 7.464 — ABNORMAL HIGH (ref 7.350–7.450)
pH, Arterial: 7.486 — ABNORMAL HIGH (ref 7.350–7.450)
pO2, Arterial: 32 mmHg — CL (ref 83.0–108.0)
pO2, Arterial: 35 mmHg — CL (ref 83.0–108.0)
pO2, Arterial: 82 mmHg — ABNORMAL LOW (ref 83.0–108.0)

## 2020-08-12 LAB — GLUCOSE, CAPILLARY
Glucose-Capillary: 156 mg/dL — ABNORMAL HIGH (ref 70–99)
Glucose-Capillary: 209 mg/dL — ABNORMAL HIGH (ref 70–99)
Glucose-Capillary: 179 mg/dL — ABNORMAL HIGH (ref 70–99)
Glucose-Capillary: 189 mg/dL — ABNORMAL HIGH (ref 70–99)

## 2020-08-12 LAB — PROTIME-INR
INR: 1.3 — ABNORMAL HIGH (ref 0.8–1.2)
Prothrombin Time: 16.1 seconds — ABNORMAL HIGH (ref 11.4–15.2)

## 2020-08-12 LAB — BASIC METABOLIC PANEL
Anion gap: 13 (ref 5–15)
BUN: 16 mg/dL (ref 6–20)
CO2: 30 mmol/L (ref 22–32)
Calcium: 8.7 mg/dL — ABNORMAL LOW (ref 8.9–10.3)
Chloride: 88 mmol/L — ABNORMAL LOW (ref 98–111)
Creatinine, Ser: 0.99 mg/dL (ref 0.61–1.24)
GFR, Estimated: >60 mL/min (ref >60)
Glucose, Bld: 163 mg/dL — ABNORMAL HIGH (ref 70–99)
Potassium: 4 mmol/L (ref 3.5–5.1)
Sodium: 131 mmol/L — ABNORMAL LOW (ref 135–145)

## 2020-08-12 LAB — HEPARIN LEVEL (UNFRACTIONATED): Heparin Unfractionated: 0.38 IU/mL (ref 0.30–0.70)

## 2020-08-12 LAB — APTT: aPTT: 73 seconds — ABNORMAL HIGH (ref 24–36)

## 2020-08-12 SURGERY — RIGHT/LEFT HEART CATH AND CORONARY ANGIOGRAPHY
Anesthesia: LOCAL

## 2020-08-12 MED ORDER — HEPARIN SODIUM (PORCINE) 1000 UNIT/ML IJ SOLN
INTRAMUSCULAR | Status: AC
  Filled 2020-08-12: qty 1

## 2020-08-12 MED ORDER — ACETAMINOPHEN 325 MG PO TABS: 650.0000 mg | ORAL_TABLET | ORAL | Status: DC | PRN

## 2020-08-12 MED ORDER — LABETALOL HCL 5 MG/ML IV SOLN
10.0000 mg | INTRAVENOUS | Status: AC | PRN
  Filled 2020-08-12: qty 4

## 2020-08-12 MED ORDER — HEPARIN (PORCINE) IN NACL 1000-0.9 UT/500ML-% IV SOLN
INTRAVENOUS | Status: DC | PRN
  Administered 2020-08-12 (×2): 500 mL

## 2020-08-12 MED ORDER — SODIUM CHLORIDE 0.9 % IV SOLN: 250.0000 mL | INTRAVENOUS | Status: DC | PRN

## 2020-08-12 MED ORDER — MIDAZOLAM HCL 2 MG/2ML IJ SOLN
INTRAMUSCULAR | Status: DC | PRN
  Administered 2020-08-12: 1 mg via INTRAVENOUS

## 2020-08-12 MED ORDER — HYDRALAZINE HCL 20 MG/ML IJ SOLN: 10.0000 mg | INTRAMUSCULAR | Status: AC | PRN

## 2020-08-12 MED ORDER — LIDOCAINE HCL (PF) 1 % IJ SOLN
INTRAMUSCULAR | Status: DC | PRN
  Administered 2020-08-12 (×2): 2 mL

## 2020-08-12 MED ORDER — APIXABAN 5 MG PO TABS: 5.0000 mg | ORAL_TABLET | Freq: Two times a day (BID) | ORAL | Status: DC

## 2020-08-12 MED ORDER — VERAPAMIL HCL 2.5 MG/ML IV SOLN
INTRAVENOUS | Status: AC
  Filled 2020-08-12: qty 2

## 2020-08-12 MED ORDER — FENTANYL CITRATE (PF) 100 MCG/2ML IJ SOLN
INTRAMUSCULAR | Status: DC | PRN
  Administered 2020-08-12: 25 ug via INTRAVENOUS

## 2020-08-12 MED ORDER — MIDAZOLAM HCL 2 MG/2ML IJ SOLN
INTRAMUSCULAR | Status: AC
  Filled 2020-08-12: qty 2

## 2020-08-12 MED ORDER — SODIUM CHLORIDE 0.9% FLUSH
3.0000 mL | Freq: Two times a day (BID) | INTRAVENOUS | Status: DC
  Administered 2020-08-12 – 2020-08-13 (×2): 3 mL via INTRAVENOUS

## 2020-08-12 MED ORDER — APIXABAN 5 MG PO TABS
10.0000 mg | ORAL_TABLET | Freq: Two times a day (BID) | ORAL | Status: DC
  Administered 2020-08-12 – 2020-08-15 (×7): 10 mg via ORAL
  Filled 2020-08-12 (×7): qty 2

## 2020-08-12 MED ORDER — HEPARIN (PORCINE) IN NACL 1000-0.9 UT/500ML-% IV SOLN
INTRAVENOUS | Status: AC
  Filled 2020-08-12: qty 500

## 2020-08-12 MED ORDER — HEPARIN SODIUM (PORCINE) 1000 UNIT/ML IJ SOLN
INTRAMUSCULAR | Status: DC | PRN
  Administered 2020-08-12: 5000 [IU] via INTRAVENOUS

## 2020-08-12 MED ORDER — FENTANYL CITRATE (PF) 100 MCG/2ML IJ SOLN
INTRAMUSCULAR | Status: AC
  Filled 2020-08-12: qty 2

## 2020-08-12 MED ORDER — ONDANSETRON HCL 4 MG/2ML IJ SOLN: 4.0000 mg | Freq: Four times a day (QID) | INTRAMUSCULAR | Status: DC | PRN

## 2020-08-12 MED ORDER — SODIUM CHLORIDE 0.9% FLUSH
3.0000 mL | Freq: Two times a day (BID) | INTRAVENOUS | Status: DC
  Administered 2020-08-12 – 2020-08-14 (×3): 3 mL via INTRAVENOUS

## 2020-08-12 MED ORDER — LIDOCAINE HCL (PF) 1 % IJ SOLN
INTRAMUSCULAR | Status: AC
  Filled 2020-08-12: qty 30

## 2020-08-12 MED ORDER — SODIUM CHLORIDE 0.9% FLUSH: 3.0000 mL | INTRAVENOUS | Status: DC | PRN

## 2020-08-12 MED ORDER — IOHEXOL 350 MG/ML SOLN
INTRAVENOUS | Status: DC | PRN
  Administered 2020-08-12: 60 mL via INTRA_ARTERIAL

## 2020-08-12 MED ORDER — SODIUM CHLORIDE 0.9 % IV SOLN: INTRAVENOUS | Status: DC

## 2020-08-12 MED ORDER — VERAPAMIL HCL 2.5 MG/ML IV SOLN
INTRAVENOUS | Status: DC | PRN
  Administered 2020-08-12: 10 mL via INTRA_ARTERIAL

## 2020-08-12 MED ORDER — TORSEMIDE 20 MG PO TABS: 40.0000 mg | ORAL_TABLET | Freq: Every day | ORAL | Status: DC

## 2020-08-12 SURGICAL SUPPLY — 13 items
CATH 5FR JL3.5 JR4 ANG PIG MP (CATHETERS) ×1 IMPLANT
CATH BALLN WEDGE 5F 110CM (CATHETERS) ×1 IMPLANT
DEVICE RAD COMP TR BAND LRG (VASCULAR PRODUCTS) ×1 IMPLANT
GLIDESHEATH SLEND SS 6F .021 (SHEATH) ×1 IMPLANT
KIT HEART LEFT (KITS) ×1 IMPLANT
KIT MICROPUNCTURE NIT STIFF (SHEATH) ×1 IMPLANT
PACK CARDIAC CATHETERIZATION (CUSTOM PROCEDURE TRAY) ×2 IMPLANT
SHEATH GLIDE SLENDER 4/5FR (SHEATH) ×1 IMPLANT
SHEATH PROBE COVER 6X72 (BAG) ×1 IMPLANT
TRANSDUCER W/STOPCOCK (MISCELLANEOUS) ×2 IMPLANT
TUBING CIL FLEX 10 FLL-RA (TUBING) ×1 IMPLANT
WIRE EMERALD 3MM-J .035X150CM (WIRE) ×1 IMPLANT
WIRE HI TORQ VERSACORE J 260CM (WIRE) ×1 IMPLANT

## 2020-08-12 NOTE — Progress Notes (Signed)
PROGRESS NOTE    Donald [name]  URK:[id number] DOB: 27-Oct-1960 DOA: 08/06/2020 PCP: Renaye Rakers, MD    Brief Narrative:  Mr. Brayfield was admitted to the hospital with the working diagnosis of acute on chronic right heart failure due to recurrent pulmonary embolism.    60 year old male past medical history for hypertension, COPD, atrial fibrillation, systolic heart failure, pulmonary embolism, CVA, dyslipidemia who presented with chest pain and palpitations.  He had atrial flutter ablation 2019 and cardioversion in 02/22.  He reported 24 hours of severe palpitations, associated with chest pain and dyspnea.  On his initial physical examination his blood pressure was 97/67, heart rate 118, respirate 29, temperature 98.1, oxygen saturation 91%, heart S1-S2, present, irregularly irregular, lungs with rales at the left lower lobe, no wheezing, positive increased work of breathing, soft abdomen, positive lower extremity edema bilaterally.   Sodium 134, potassium 3.7, chloride 99, bicarb 25, glucose 149, BUN 8, creatinine 1.31, BNP 442, high sensitive troponin 20-21, white count 11.8, hematocrit 13.6, hematocrit 43.1, platelets 240. SARS COVID-19 negative.   Chest radiograph with cardiomegaly, hilar vascular congestion, right base atelectasis.   EKG with 134 bpm, normal axis, normal QTC, atrial fibrillation rhythm, no ST segment T wave changes.   Chest CT with bilateral pulm embolism involving lobar pulmonary arteries and distal on the right and segmental pulmonary arteries on the left.  Positive for acute right heart strain.  Small right pleural effusion with atelectasis.  Right middle lobe groundglass opacity, likely pulmonary hemorrhage/ infarct.   Patient was placed on anticoagulation and admitted to the intensive care unit.   Remained hemodynamically stable, transfer to [facility] on 07/14.   Responding well to diuresis.  Pulmonary infarct, no signs of infection, continue to hold on antibiotic  therapy (pneumonia ruled out).   07/18 cardiac catheterization  RV = 46/4 PA = 49/17 (32) PCW = 16 Fick cardiac output/ index= 5.5/ 2,3  PVR= 2.9   Ef 25% Non obstructive CAD Non ischemic cardiomyopathy, pulmonary hypertension.   Assessment & Plan:   Principal Problem:   Atrial fibrillation with RVR (HCC) Active Problems:   Type 2 diabetes mellitus without complication (HCC)   Pulmonary embolism (HCC)   Essential hypertension   COPD (chronic obstructive pulmonary disease) (HCC)   CAP (community acquired pneumonia)   OSA on CPAP   Leukocytosis   Acute pulmonary embolism (HCC)    Acute decompensation of chronic diastolic heart failure (right heart failure), in the setting of acute bilateral pulmonary embolism. Class 2 post capillary pulmonary hypertension.  Patient continue to have elevated PCW pressure up to 16, no significant peripheral edema.  Continue diuresis with torsemide 40 mg po daily to start in am.  Continue with entresto and spironolactone.   Consider B blockade   2. Atrial fibrillation. post cardiac catheterization, had increase heart rate, up to 130.  Required IV metoprolol.  Continue with IV amiodarone and oral digoxin Anticoagulation with IV heparin    3. Hx of CVA. On aspirin and satatin   4. HTN. Systolic blood pressure from 100 to 120 mmHg systolic.  Continue with entresto.    5. COPD. No clinical signs of acute exacerbation. On bronchodilator therapy.   6. T2DM.  Fasting glucose is 163 mg/dl, continue with insulin sliding scale for glucose cover and monitoring.    7. Obesity class 3/ OSA. Calculated BMI is 40,7. Continue with Cpap   8. Diuresis induced hypokalemia./ hypoNatremia. NA is 131 today with K at 4,0 and serum bicarbonate  at 30. Cr is 0,99   Continue diuresis with torsemide and follow up renal function in am, avoid hypotension and nephrotoxic medications.  Continue K correction and supplementation with KCL.    Patient continue to  be at high risk for worsening heart failure   Status is: Inpatient  Remains inpatient appropriate because:Inpatient level of care appropriate due to severity of illness  Dispo: The patient is from: Home              Anticipated d/c is to: Home              Patient currently is not medically stable to d/c.   Difficult to place patient No   DVT prophylaxis: Heparin IV   Code Status:    full  Family Communication:  No family at the bedside    Consultants:  Cardiology   Procedures:  Cath left and right   Subjective: Patient continue to have some dyspnea but improved, not yet back to baseline, no nausea or vomiting.   Objective: Vitals:   08/12/20 1000 08/12/20 1011 08/12/20 1045 08/12/20 1117  BP: (!) 136/121 (!) 121/91 106/83 (!) 115/97  Pulse: (!) 131 (!) 111 (!) 104 (!) 113  Resp: (!) 28 19 16 16   Temp:      TempSrc:      SpO2: 95% 94% 94% 95%  Weight:      Height:        Intake/Output Summary (Last 24 hours) at 08/12/2020 1205 Last data filed at 08/12/2020 1003 Gross per 24 hour  Intake --  Output 600 ml  Net -600 ml   Filed Weights   08/10/20 0616 08/11/20 0500 08/12/20 0520  Weight: 128.2 kg 124.2 kg 122.8 kg    Examination:   General: Not in pain or dyspnea  Neurology: Awake and alert, non focal  E ENT: mild pallor, no icterus, oral mucosa moist Cardiovascular: No JVD. S1-S2 present, rhythmic, no gallops, rubs, or murmurs. Trace lower extremity edema. Pulmonary: positive breath sounds bilaterally,, no wheezing, rhonchi or rales. Gastrointestinal. Abdomen protuberant  Skin. No rashes Musculoskeletal: no joint deformities     Data Reviewed: I have personally reviewed following labs and imaging studies  CBC: Recent Labs  Lab 08/08/20 0414 08/09/20 0327 08/10/20 0307 08/11/20 0243 08/12/20 0326  WBC 11.5* 9.7 7.7 7.7 8.0  HGB 12.7* 12.5* 12.5* 13.5 14.5  HCT 40.4 39.4 37.9* 41.5 45.1  MCV 85.2 84.4 82.6 82.3 82.6  PLT 258 254 262 288 293    Basic Metabolic Panel: Recent Labs  Lab 08/06/20 1414 08/07/20 0423 08/08/20 0414 08/09/20 0327 08/10/20 0307 08/11/20 0243 08/12/20 0326  NA  --  131* 132* 131* 131* 126* 131*  K  --  3.8 4.4 3.9 3.1* 3.6 4.0  CL  --  98 97* 96* 88* 82* 88*  CO2  --  22 24 26 31  33* 30  GLUCOSE  --  212* 143* 130* 157* 158* 163*  BUN  --  13 16 17 18 15 16   CREATININE  --  1.09 1.18 1.11 1.09 0.97 0.99  CALCIUM  --  8.3* 8.8* 8.5* 8.6* 8.5* 8.7*  MG 1.6* 1.9 1.8  --  1.6* 1.9  --   PHOS  --  3.0 2.6  --   --   --   --    GFR: Estimated Creatinine Clearance: 110.1 mL/min (by C-G formula based on SCr of 0.99 mg/dL). Liver Function Tests: No results for input(s): AST, ALT, ALKPHOS, BILITOT,  PROT, ALBUMIN in the last 168 hours. No results for input(s): LIPASE, AMYLASE in the last 168 hours. No results for input(s): AMMONIA in the last 168 hours. Coagulation Profile: No results for input(s): INR, PROTIME in the last 168 hours. Cardiac Enzymes: No results for input(s): CKTOTAL, CKMB, CKMBINDEX, TROPONINI in the last 168 hours. BNP (last 3 results) No results for input(s): PROBNP in the last 8760 hours. HbA1C: Recent Labs    08/09/20 2305  HGBA1C 7.2*   CBG: Recent Labs  Lab 08/11/20 1202 08/11/20 1713 08/11/20 2100 08/12/20 0533 08/12/20 1020  GLUCAP 202* 167* 162* 156* 209*   Lipid Profile: No results for input(s): CHOL, HDL, LDLCALC, TRIG, CHOLHDL, LDLDIRECT in the last 72 hours. Thyroid Function Tests: No results for input(s): TSH, T4TOTAL, FREET4, T3FREE, THYROIDAB in the last 72 hours. Anemia Panel: No results for input(s): VITAMINB12, FOLATE, FERRITIN, TIBC, IRON, RETICCTPCT in the last 72 hours.    Radiology Studies: I have reviewed all of the imaging during this hospital visit personally     Scheduled Meds:  apixaban  10 mg Oral BID   Followed by   Melene Muller ON 08/19/2020] apixaban  5 mg Oral BID   Chlorhexidine Gluconate Cloth  6 each Topical Daily   digoxin   0.125 mg Oral Daily   feeding supplement (GLUCERNA SHAKE)  237 mL Oral TID BM   furosemide  80 mg Intravenous BID   guaiFENesin  600 mg Oral BID   insulin aspart  0-9 Units Subcutaneous TID WC    morphine injection  2 mg Intravenous Once   potassium chloride  40 mEq Oral BID   sacubitril-valsartan  1 tablet Oral BID   sodium chloride flush  3 mL Intravenous Q12H   sodium chloride flush  3 mL Intravenous Q12H   sodium chloride flush  3 mL Intravenous Q12H   spironolactone  12.5 mg Oral Daily   [START ON 08/13/2020] torsemide  40 mg Oral Daily   Continuous Infusions:  sodium chloride     sodium chloride     amiodarone 60 mg/hr (08/12/20 1144)     LOS: 6 days        Francys Bolin Annett Gula, MD

## 2020-08-12 NOTE — Progress Notes (Signed)
Occupational Therapy Treatment Patient Details Name: Donald [name] MRN: [id number] DOB: 09/02/1960 Today's Date: 08/12/2020    History of present illness 60 y.o. male presenting on 08/06/2020 for chest discomfort from palpitations. CXR concerning for pneumonia and possible volume overload. Had syncopal episode in ER witnessed by nurse. Chest CT with bilateral pulm embolism. 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  PMH: HTN, COPD, atrial fibrillation with cardioversion on 03/12/20, systolic CHF,  prediabetes, PE in 02/2020, hx of CVA in 2018, HLD, hx of right sided atrial flutter ablation in 2019.   OT comments  Pt. Has improved with mobility and adls since last week. Pt. Is motivated to work with therapy to maximize potential. Pt. Is worried about paying his bills at home. Contacted hospital social worker to go talk with pt. Acute OT to continue.   Follow Up Recommendations  Home health OT    Equipment Recommendations  None recommended by OT    Recommendations for Other Services      Precautions / Restrictions Precautions Precautions: Fall Precaution Comments: is on cpap but not at all times Restrictions Weight Bearing Restrictions: No       Mobility Bed Mobility                    Transfers Overall transfer level: Needs assistance Equipment used: Rolling walker (2 wheeled) Transfers: Sit to/from Stand Sit to Stand: Supervision Stand pivot transfers: Supervision       General transfer comment: amb in room with and without rw.    Balance Overall balance assessment: Mild deficits observed, not formally tested (improved since last week.)                                         ADL either performed or assessed with clinical judgement   ADL Overall ADL's : Needs assistance/impaired Eating/Feeding: Independent   Grooming: Wash/dry hands;Wash/dry face;Standing   Upper Body Bathing: Supervision/ safety;Sitting   Lower Body Bathing:  Minimal assistance;Sit to/from stand   Upper Body Dressing : Set up;Sitting   Lower Body Dressing: Moderate assistance;Sit to/from stand   Toilet Transfer: Supervision/safety   Toileting- Architect and Hygiene: Supervision/safety       Functional mobility during ADLs: Supervision/safety;Rolling walker General ADL Comments: Pt. states he has ae at home.     Vision   Vision Assessment?: No apparent visual deficits   Perception     Praxis      Cognition Arousal/Alertness: Awake/alert Behavior During Therapy: WFL for tasks assessed/performed Overall Cognitive Status: Within Functional Limits for tasks assessed                                          Exercises     Shoulder Instructions       General Comments      Pertinent Vitals/ Pain       Pain Assessment: No/denies pain  Home Living                                          Prior Functioning/Environment              Frequency  Min 2X/week  Progress Toward Goals  OT Goals(current goals can now be found in the care plan section)  Progress towards OT goals: Progressing toward goals  Acute Rehab OT Goals Patient Stated Goal: go home OT Goal Formulation: With patient Time For Goal Achievement: 08/23/20 Potential to Achieve Goals: Good ADL Goals Pt Will Perform Lower Body Bathing: with modified independence Pt Will Perform Lower Body Dressing: with modified independence Pt Will Perform Toileting - Clothing Manipulation and hygiene: with modified independence  Plan Frequency remains appropriate    Co-evaluation                 AM-PAC OT "6 Clicks" Daily Activity     Outcome Measure   Help from another person eating meals?: None Help from another person taking care of personal grooming?: A Little Help from another person toileting, which includes using toliet, bedpan, or urinal?: A Little Help from another person bathing (including  washing, rinsing, drying)?: A Little Help from another person to put on and taking off regular upper body clothing?: A Little Help from another person to put on and taking off regular lower body clothing?: A Little 6 Click Score: 19    End of Session Equipment Utilized During Treatment: Rolling walker  OT Visit Diagnosis: Unsteadiness on feet (R26.81);Muscle weakness (generalized) (M62.81)   Activity Tolerance Patient tolerated treatment well   Patient Left in bed;with call bell/phone within reach   Nurse Communication  (ok therapy)        Time: 6222-9798 OT Time Calculation (min): 29 min  Charges: OT General Charges $OT Visit: 1 Visit OT Treatments $Self Care/Home Management : 8-22 mins $Therapeutic Activity: 8-22 mins  Derrek Gu OT/L    Jermiya Reichl 08/12/2020, 11:19 AM

## 2020-08-12 NOTE — H&P (View-Only) (Signed)
Advanced Heart Failure Rounding Note  PCP-Cardiologist: Dr Rudolpho Sevin    Patient Profile    Mr Sporer is a 60 year old with a history of a fib, A fib ablation, CVA 2018, COPD, OSA, HTN, obesity, PE 02/2020, and chronic systolic heart failure (Echo 2/22 EF 40-45%).   Admitted with chest pain,  A fib RVR, acute PE and a/c CHF - Chest CT showed acute bilateral submassive PE w/ RV strain.  - Echo EF down 30-35% with RV moerately reduced - LE Doppler Negative for DVT   Subjective:    Remains on IV amio, IV lasix and heparin.   I/Os -14L this admit. Wt down 30 lb overall. SCr/K WNL. On for [facility] today.    Objective:   Weight Range: 122.8 kg Body mass index is 36.73 kg/m.   Vital Signs:   Temp:  [97.8 F (36.6 C)-98.5 F (36.9 C)] 98.3 F (36.8 C) (07/18 0719) Pulse Rate:  [59-124] 59 (07/18 0719) Resp:  [17-18] 17 (07/18 0520) BP: (96-123)/(68-94) 120/91 (07/18 0719) SpO2:  [92 %-95 %] 92 % (07/18 0719) Weight:  [122.8 kg] 122.8 kg (07/18 0520) Last BM Date: 08/11/20  Weight change: Filed Weights   08/10/20 0616 08/11/20 0500 08/12/20 0520  Weight: 128.2 kg 124.2 kg 122.8 kg    Intake/Output:   Intake/Output Summary (Last 24 hours) at 08/12/2020 0736 Last data filed at 08/11/2020 1551 Gross per 24 hour  Intake --  Output 300 ml  Net -300 ml      Physical Exam    PHYSICAL EXAM: General:  Well appearing. No respiratory difficulty HEENT: normal Neck: supple. no JVD. Carotids 2+ bilat; no bruits. No lymphadenopathy or thyromegaly appreciated. Cor: PMI nondisplaced. Irreg/Irreg rhythm and rte. No rubs, gallops or murmurs. Lungs: clear Abdomen: soft, nontender, nondistended. No hepatosplenomegaly. No bruits or masses. Good bowel sounds. Extremities: no cyanosis, clubbing, rash, edema Neuro: alert & oriented x 3, cranial nerves grossly intact. moves all 4 extremities w/o difficulty. Affect pleasant.    Telemetry   AF 80-110  Personally reviewed  Labs     CBC Recent Labs    08/11/20 0243 08/12/20 0326  WBC 7.7 8.0  HGB 13.5 14.5  HCT 41.5 45.1  MCV 82.3 82.6  PLT 288 293   Basic Metabolic Panel Recent Labs    58/09/98 0307 08/11/20 0243 08/12/20 0326  NA 131* 126* 131*  K 3.1* 3.6 4.0  CL 88* 82* 88*  CO2 31 33* 30  GLUCOSE 157* 158* 163*  BUN 18 15 16   CREATININE 1.09 0.97 0.99  CALCIUM 8.6* 8.5* 8.7*  MG 1.6* 1.9  --    Liver Function Tests No results for input(s): AST, ALT, ALKPHOS, BILITOT, PROT, ALBUMIN in the last 72 hours. No results for input(s): LIPASE, AMYLASE in the last 72 hours. Cardiac Enzymes No results for input(s): CKTOTAL, CKMB, CKMBINDEX, TROPONINI in the last 72 hours.  BNP: BNP (last 3 results) Recent Labs    03/18/20 1633 08/06/20 0754 08/07/20 0423  BNP 254.6* 442.4* 650.8*    ProBNP (last 3 results) No results for input(s): PROBNP in the last 8760 hours.   D-Dimer No results for input(s): DDIMER in the last 72 hours. Hemoglobin A1C Recent Labs    08/09/20 2305  HGBA1C 7.2*   Fasting Lipid Panel No results for input(s): CHOL, HDL, LDLCALC, TRIG, CHOLHDL, LDLDIRECT in the last 72 hours. Thyroid Function Tests No results for input(s): TSH, T4TOTAL, T3FREE, THYROIDAB in the last 72 hours.  Invalid input(s): FREET3   Other results:   Imaging    No results found.   Medications:     Scheduled Medications:  Chlorhexidine Gluconate Cloth  6 each Topical Daily   digoxin  0.125 mg Oral Daily   feeding supplement (GLUCERNA SHAKE)  237 mL Oral TID BM   furosemide  80 mg Intravenous BID   guaiFENesin  600 mg Oral BID   insulin aspart  0-9 Units Subcutaneous TID WC    morphine injection  2 mg Intravenous Once   potassium chloride  40 mEq Oral BID   sacubitril-valsartan  1 tablet Oral BID   sodium chloride flush  3 mL Intravenous Q12H   sodium chloride flush  3 mL Intravenous Q12H   spironolactone  12.5 mg Oral Daily    Infusions:  sodium chloride     sodium chloride      sodium chloride 10 mL/hr at 08/12/20 0137   amiodarone 60 mg/hr (08/12/20 0537)   heparin 2,900 Units/hr (08/12/20 0536)    PRN Medications: sodium chloride, sodium chloride, acetaminophen **OR** acetaminophen, cyclobenzaprine, docusate sodium, levalbuterol, metoprolol tartrate, polyethylene glycol, sodium chloride flush, sodium chloride flush, zolpidem    Patient Profile    Mr Weisinger is a 60 year old with a history of a fib, A fib ablation, CVA 2018, COPD, OSA, HTN, obesity, PE 02/2020, and chronic systolic heart failure.   Admitted with chest pain and A fib RVR, acute PE and a/c CHF.   Assessment/Plan   1. A/C Systolic Heart Failure  Echo 02/2020 EF 40-45%--> ECHO repeat and showed EF down 30-35% with RV moerately reduced, and RA/LA moderately dilated.RA 15.  - Diuresing very well. Overall down 30 lb, plan RHC today to guide further diuresis  - Continue spiro 12.5 daily  - Continue digoxin 0.125 mg daily.  - Continue Entresto 24/26 bid - No b-blocker yet - Renal function stable. SCr 0.99 - Continue unna boots.  - R/L cath today   2. Recurrent PE -  H/O PE back in February 2022. He was not taking eliquis.  - CTA this admit  --> Now with acute bilateral submassive PE.  - L/E [place] negative -> No indication for filter placement. --negative for DVT.  - Eliqiuis stopped and started on heparin drip pending cath. No bleeding on heparin.   3. A fib RVR  - Had DC-CV 03/12/20  - Admitted witth recurrent A fib RVR. 7/14  - Now on amio gtt. Rate improved - Eliqiuis stopped and started on heparin drip  - Once diuresed if he remains in A fib will need TEE/DC-CV.  - No bleeding on heparin. Hgb 14  4. OSA - On CPAP.   5. Hypokalemia - resolved, K 4.0 today  - supp PRN   6. Obesity Body mass index is 36.73 kg/m.  7.  H/O embolic CVA  - plan a/c per above   8. SDOH needs - He has been referred to HF Paramedicine program.  Consult cardiac rehab.  - Medication concerns reviewed  with patient and pharmacy team. Barriers identified: Yes med complicance. Will need referral to HF Paramedicine. Lives in [place]. Of note he cancelled his last 2 appointments in HF clinic back in March.    Length of Stay: [address], [place]  08/12/2020, 7:36 AM  Advanced Heart Failure Team Pager [phone] (M-F; 7a - 5p)  Please contact [facility] for night-coverage after hours (5p -7a ) and weekends on [email or web address]  Patient seen and  examined with the above-signed Advanced Practice Provider and/or Housestaff. I personally reviewed laboratory data, imaging studies and relevant notes. I independently examined the patient and formulated the important aspects of the plan. I have edited the note to reflect any of my changes or salient points. I have personally discussed the plan with the patient and/or family.  Has been diuresing well. Weight down 30 pounds. Breathing better. No orthopnea or PND. Remains in AFL. On IV amio and heparin.  General: Sitting up  No resp difficulty HEENT: normal Neck: supple. JVP 9-10 Carotids 2+ bilat; no bruits. No lymphadenopathy or thryomegaly appreciated. Cor: PMI nondisplaced. Irregular rate & rhythm. No rubs, gallops or murmurs. Lungs: clear Abdomen: soft, nontender, nondistended. No hepatosplenomegaly. No bruits or masses. Good bowel sounds. Extremities: no cyanosis, clubbing, rash, 1+ edema + UNNA Neuro: alert & orientedx3, cranial nerves grossly intact. moves all 4 extremities w/o difficulty. Affect pleasant  Improving but still seems tho have some volume overload. For R/L cath today. Will need TEE/DC-CV later this week. Potentially tomorrow. Continue to titrate GDMT.   Arvilla Meres, MD  7:51 AM

## 2020-08-12 NOTE — Progress Notes (Signed)
ANTICOAGULATION CONSULT NOTE  Pharmacy Consult for IV Heparin > apixaban  Indication: pulmonary embolus  Allergies  Allergen Reactions   Lisinopril Cough    Patient Measurements: Height: 6' (182.9 cm) Weight: 122.8 kg (270 lb 12.8 oz) IBW/kg (Calculated) : 77.6 Heparin Dosing Weight: 108 kg  Vital Signs: Temp: 98.3 F (36.8 C) (07/18 0719) Temp Source: Oral (07/18 0719) BP: 115/97 (07/18 1117) Pulse Rate: 113 (07/18 1117)  Labs: Recent Labs    08/09/20 2048 08/09/20 2137 08/09/20 2301 08/10/20 0307 08/11/20 0243 08/11/20 1131 08/11/20 2043 08/12/20 0326  HGB   < >  --   --  12.5* 13.5  --   --  14.5  HCT  --   --   --  37.9* 41.5  --   --  45.1  PLT  --   --   --  262 288  --   --  293  APTT  --   --   --  70* 65* 47* 64* 73*  HEPARINUNFRC  --   --   --  0.68 0.40  --   --  0.38  CREATININE  --   --   --  1.09 0.97  --   --  0.99  TROPONINIHS  --  14 16 14   --   --   --   --    < > = values in this interval not displayed.    Estimated Creatinine Clearance: 110.1 mL/min (by C-G formula based on SCr of 0.99 mg/dL).  Medical History: Past Medical History:  Diagnosis Date   Arrhythmia    A fib    Atrial fibrillation (HCC)    CHF (congestive heart failure) (HCC)    COPD (chronic obstructive pulmonary disease) (HCC)    Hypertension    Sleep apnea    Stroke [facility])     Assessment: 60 yr old man man with PMHx significant for HTN, OSA (on CPAP), COPD, afib (S/P cardioversion 03/12/2020) and aflutter (S/P R-sided ablation 2019), PE (02/2020, on apixaban PTA, last dose 7/11 evening), CVA (2018). Pt presented with chest discomfort, palpitations and LE swelling. CT: bilateral submassive PE with RHS; Dopplers: no evidence of DVT.  Pt was started on IV heparin infusion on admission (last infusion rate was 2300 units/hr), then switched to apixaban on 7/13; Pt's anticoagulation swithced back to IV heparin in anticipation of R/L heart cath. Given recent apixaban exposure,  will monitor anticoagulation using aPTT until aPTT and heparin levels correlate.  aPTT slightly therapeutic (73 sec / HL 0.38) on heparin at 2900 units/hr.  CBC stable this am S/p R/L heart cath and heparin stopped - plan to change to apixaban post procedure.   TR band placed - hemostasis achieved  -  begin apixaban Will reload patient with apixaban  Plan DCCV 7/19  Goal of Therapy:   Monitor platelets by anticoagulation protocol: Yes   Plan:  Stop heparin Apixaban 10mg  BID x7 days and then 5mg  BID there after   8/19 Pharm.D. CPP, BCPS Clinical Pharmacist [phone] 08/12/2020 1:47 PM   Please check AMION for all [facility] phone numbers After 10:00 PM, call Main Pharmacy [phone]

## 2020-08-12 NOTE — Progress Notes (Addendum)
Advanced Heart Failure Rounding Note  PCP-Cardiologist: Dr Rudolpho Sevin    Patient Profile    Donald Sporer is a 60 year old with a history of a fib, A fib ablation, CVA 2018, COPD, OSA, HTN, obesity, PE 02/2020, and chronic systolic heart failure (Echo 2/22 EF 40-45%).   Admitted with chest pain,  A fib RVR, acute PE and a/c CHF - Chest CT showed acute bilateral submassive PE w/ RV strain.  - Echo EF down 30-35% with RV moerately reduced - LE Doppler Negative for DVT   Subjective:    Remains on IV amio, IV lasix and heparin.   I/Os -14L this admit. Wt down 30 lb overall. SCr/K WNL. On for [facility] today.    Objective:   Weight Range: 122.8 kg Body mass index is 36.73 kg/m.   Vital Signs:   Temp:  [97.8 F (36.6 C)-98.5 F (36.9 C)] 98.3 F (36.8 C) (07/18 0719) Pulse Rate:  [59-124] 59 (07/18 0719) Resp:  [17-18] 17 (07/18 0520) BP: (96-123)/(68-94) 120/91 (07/18 0719) SpO2:  [92 %-95 %] 92 % (07/18 0719) Weight:  [122.8 kg] 122.8 kg (07/18 0520) Last BM Date: 08/11/20  Weight change: Filed Weights   08/10/20 0616 08/11/20 0500 08/12/20 0520  Weight: 128.2 kg 124.2 kg 122.8 kg    Intake/Output:   Intake/Output Summary (Last 24 hours) at 08/12/2020 0736 Last data filed at 08/11/2020 1551 Gross per 24 hour  Intake --  Output 300 ml  Net -300 ml      Physical Exam    PHYSICAL EXAM: General:  Well appearing. No respiratory difficulty HEENT: normal Neck: supple. no JVD. Carotids 2+ bilat; no bruits. No lymphadenopathy or thyromegaly appreciated. Cor: PMI nondisplaced. Irreg/Irreg rhythm and rte. No rubs, gallops or murmurs. Lungs: clear Abdomen: soft, nontender, nondistended. No hepatosplenomegaly. No bruits or masses. Good bowel sounds. Extremities: no cyanosis, clubbing, rash, edema Neuro: alert & oriented x 3, cranial nerves grossly intact. moves all 4 extremities w/o difficulty. Affect pleasant.    Telemetry   AF 80-110  Personally reviewed  Labs     CBC Recent Labs    08/11/20 0243 08/12/20 0326  WBC 7.7 8.0  HGB 13.5 14.5  HCT 41.5 45.1  MCV 82.3 82.6  PLT 288 293   Basic Metabolic Panel Recent Labs    58/09/98 0307 08/11/20 0243 08/12/20 0326  NA 131* 126* 131*  K 3.1* 3.6 4.0  CL 88* 82* 88*  CO2 31 33* 30  GLUCOSE 157* 158* 163*  BUN 18 15 16   CREATININE 1.09 0.97 0.99  CALCIUM 8.6* 8.5* 8.7*  MG 1.6* 1.9  --    Liver Function Tests No results for input(s): AST, ALT, ALKPHOS, BILITOT, PROT, ALBUMIN in the last 72 hours. No results for input(s): LIPASE, AMYLASE in the last 72 hours. Cardiac Enzymes No results for input(s): CKTOTAL, CKMB, CKMBINDEX, TROPONINI in the last 72 hours.  BNP: BNP (last 3 results) Recent Labs    03/18/20 1633 08/06/20 0754 08/07/20 0423  BNP 254.6* 442.4* 650.8*    ProBNP (last 3 results) No results for input(s): PROBNP in the last 8760 hours.   D-Dimer No results for input(s): DDIMER in the last 72 hours. Hemoglobin A1C Recent Labs    08/09/20 2305  HGBA1C 7.2*   Fasting Lipid Panel No results for input(s): CHOL, HDL, LDLCALC, TRIG, CHOLHDL, LDLDIRECT in the last 72 hours. Thyroid Function Tests No results for input(s): TSH, T4TOTAL, T3FREE, THYROIDAB in the last 72 hours.  Invalid input(s): FREET3   Other results:   Imaging    No results found.   Medications:     Scheduled Medications:  Chlorhexidine Gluconate Cloth  6 each Topical Daily   digoxin  0.125 mg Oral Daily   feeding supplement (GLUCERNA SHAKE)  237 mL Oral TID BM   furosemide  80 mg Intravenous BID   guaiFENesin  600 mg Oral BID   insulin aspart  0-9 Units Subcutaneous TID WC    morphine injection  2 mg Intravenous Once   potassium chloride  40 mEq Oral BID   sacubitril-valsartan  1 tablet Oral BID   sodium chloride flush  3 mL Intravenous Q12H   sodium chloride flush  3 mL Intravenous Q12H   spironolactone  12.5 mg Oral Daily    Infusions:  sodium chloride     sodium chloride      sodium chloride 10 mL/hr at 08/12/20 0137   amiodarone 60 mg/hr (08/12/20 0537)   heparin 2,900 Units/hr (08/12/20 0536)    PRN Medications: sodium chloride, sodium chloride, acetaminophen **OR** acetaminophen, cyclobenzaprine, docusate sodium, levalbuterol, metoprolol tartrate, polyethylene glycol, sodium chloride flush, sodium chloride flush, zolpidem    Patient Profile    Donald [name] is a 60 year old with a history of a fib, A fib ablation, CVA 2018, COPD, OSA, HTN, obesity, PE 02/2020, and chronic systolic heart failure.   Admitted with chest pain and A fib RVR, acute PE and a/c CHF.   Assessment/Plan   1. A/C Systolic Heart Failure  Echo 02/2020 EF 40-45%--> ECHO repeat and showed EF down 30-35% with RV moerately reduced, and RA/LA moderately dilated.RA 15.  - Diuresing very well. Overall down 30 lb, plan RHC today to guide further diuresis  - Continue spiro 12.5 daily  - Continue digoxin 0.125 mg daily.  - Continue Entresto 24/26 bid - No b-blocker yet - Renal function stable. SCr 0.99 - Continue unna boots.  - R/L cath today   2. Recurrent PE -  H/O PE back in February 2022. He was not taking eliquis.  - CTA this admit  --> Now with acute bilateral submassive PE.  - L/E [place] negative -> No indication for filter placement. --negative for DVT.  - Eliqiuis stopped and started on heparin drip pending cath. No bleeding on heparin.   3. A fib RVR  - Had DC-CV 03/12/20  - Admitted witth recurrent A fib RVR. 7/14  - Now on amio gtt. Rate improved - Eliqiuis stopped and started on heparin drip  - Once diuresed if he remains in A fib will need TEE/DC-CV.  - No bleeding on heparin. Hgb 14  4. OSA - On CPAP.   5. Hypokalemia - resolved, K 4.0 today  - supp PRN   6. Obesity Body mass index is 36.73 kg/m.  7.  H/O embolic CVA  - plan a/c per above   8. SDOH needs - He has been referred to HF Paramedicine program.  Consult cardiac rehab.  - Medication concerns reviewed  with patient and pharmacy team. Barriers identified: Yes med complicance. Will need referral to HF Paramedicine. Lives in [place]. Of note he cancelled his last 2 appointments in HF clinic back in March.    Length of Stay: [address], [place]  08/12/2020, 7:36 AM  Advanced Heart Failure Team Pager [phone] (M-F; 7a - 5p)  Please contact [facility] for night-coverage after hours (5p -7a ) and weekends on [email or web address]  Patient seen and  examined with the above-signed Advanced Practice Provider and/or Housestaff. I personally reviewed laboratory data, imaging studies and relevant notes. I independently examined the patient and formulated the important aspects of the plan. I have edited the note to reflect any of my changes or salient points. I have personally discussed the plan with the patient and/or family.  Has been diuresing well. Weight down 30 pounds. Breathing better. No orthopnea or PND. Remains in AFL. On IV amio and heparin.  General: Sitting up  No resp difficulty HEENT: normal Neck: supple. JVP 9-10 Carotids 2+ bilat; no bruits. No lymphadenopathy or thryomegaly appreciated. Cor: PMI nondisplaced. Irregular rate & rhythm. No rubs, gallops or murmurs. Lungs: clear Abdomen: soft, nontender, nondistended. No hepatosplenomegaly. No bruits or masses. Good bowel sounds. Extremities: no cyanosis, clubbing, rash, 1+ edema + UNNA Neuro: alert & orientedx3, cranial nerves grossly intact. moves all 4 extremities w/o difficulty. Affect pleasant  Improving but still seems tho have some volume overload. For R/L cath today. Will need TEE/DC-CV later this week. Potentially tomorrow. Continue to titrate GDMT.   Arvilla Meres, MD  7:51 AM

## 2020-08-12 NOTE — Progress Notes (Signed)
Physical Therapy Treatment Patient Details Name: Donald [name] MRN: [id number] DOB: 04-14-1960 Today's Date: 08/12/2020    History of Present Illness 60 y.o. male presenting on 08/06/2020 for chest discomfort from palpitations. CXR concerning for pneumonia and possible volume overload. Had syncopal episode in ER witnessed by nurse. Chest CT with bilateral pulm embolism. 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  PMH: HTN, COPD, atrial fibrillation with cardioversion on 03/12/20, systolic CHF,  prediabetes, PE in 02/2020, hx of CVA in 2018, HLD, hx of right sided atrial flutter ablation in 2019.    PT Comments    Pt eager to mobilize OOB. Pt ambulatory for multiple hallway distances with RW today, requiring seated rest to recover dyspnea and fatigue. MaxHR 130 bpm during gait today, so more controlled today vs PT eval. PT to continue to follow acutely.    Follow Up Recommendations  Home health PT     Equipment Recommendations  None recommended by PT    Recommendations for Other Services       Precautions / Restrictions Precautions Precautions: Fall Precaution Comments: CPAP PRN Restrictions Weight Bearing Restrictions: No    Mobility  Bed Mobility Overal bed mobility: Modified Independent             General bed mobility comments: sitting EOB    Transfers Overall transfer level: Needs assistance Equipment used: Rolling walker (2 wheeled) Transfers: Sit to/from Stand Sit to Stand: Supervision Stand pivot transfers: Supervision       General transfer comment: for safety, slow to rise  Ambulation/Gait Ambulation/Gait assistance: Supervision Gait Distance (Feet): 150 Feet (+100+100) Assistive device: Rolling walker (2 wheeled) Gait Pattern/deviations: Step-through pattern;Wide base of support;Trunk flexed Gait velocity: decr   General Gait Details: supervision for safety, verbal cuing for upright posture. x2 seated rest breaks lasting 2 minutes, max HR  during gait 130 bpm.   Stairs             Wheelchair Mobility    Modified Rankin (Stroke Patients Only)       Balance Overall balance assessment: Mild deficits observed, not formally tested                                          Cognition Arousal/Alertness: Awake/alert Behavior During Therapy: WFL for tasks assessed/performed Overall Cognitive Status: Within Functional Limits for tasks assessed                                        Exercises      General Comments        Pertinent Vitals/Pain Pain Assessment: No/denies pain    Home Living                      Prior Function            PT Goals (current goals can now be found in the care plan section) Acute Rehab PT Goals Patient Stated Goal: go home PT Goal Formulation: With patient Time For Goal Achievement: 08/23/20 Potential to Achieve Goals: Good Progress towards PT goals: Progressing toward goals    Frequency    Min 3X/week      PT Plan Current plan remains appropriate    Co-evaluation  AM-PAC PT "6 Clicks" Mobility   Outcome Measure  Help needed turning from your back to your side while in a flat bed without using bedrails?: None Help needed moving from lying on your back to sitting on the side of a flat bed without using bedrails?: None Help needed moving to and from a bed to a chair (including a wheelchair)?: None Help needed standing up from a chair using your arms (e.g., wheelchair or bedside chair)?: A Little Help needed to walk in hospital room?: A Little Help needed climbing 3-5 steps with a railing? : A Little 6 Click Score: 21    End of Session   Activity Tolerance: Patient limited by fatigue Patient left: Other (comment);with call bell/phone within reach;with bed alarm set (EOB per pt request, and this was how pt was sitting upon PT arrival to room) Nurse Communication: Mobility status;Other (comment) (IV  needs to be checked, alarming) PT Visit Diagnosis: Other abnormalities of gait and mobility (R26.89);Difficulty in walking, not elsewhere classified (R26.2)     Time: 0272-5366 PT Time Calculation (min) (ACUTE ONLY): 35 min  Charges:  $Gait Training: 23-37 mins                     Donald [name], PT DPT Acute Rehabilitation Services Pager [phone]  Office [phone]    Donald [name] 08/12/2020, 4:30 PM

## 2020-08-12 NOTE — Interval H&P Note (Signed)
History and Physical Interval Note:  08/12/2020 7:52 AM  Donald [name]  has presented today for surgery, with the diagnosis of hf.  The various methods of treatment have been discussed with the patient and family. After consideration of risks, benefits and other options for treatment, the patient has consented to  Procedure(s): RIGHT/LEFT HEART CATH AND CORONARY ANGIOGRAPHY (N/A) possible coronary angioplasty as a surgical intervention.  The patient's history has been reviewed, patient examined, no change in status, stable for surgery.  I have reviewed the patient's chart and labs.  Questions were answered to the patient's satisfaction.     Mitsuko Luera

## 2020-08-12 NOTE — Progress Notes (Signed)
Orthopedic Tech Progress Note Patient Details:  Donald [name] [birth year] [id number]  Ortho Devices Type of Ortho Device: Roland Rack boot Ortho Device/Splint Location: Bi LE Ortho Device/Splint Interventions: Application   Post Interventions Patient Tolerated: Well  [name] 08/12/2020, 4:17 PM

## 2020-08-13 ENCOUNTER — Encounter (HOSPITAL_COMMUNITY)
Admission: EM | Disposition: A | Discharge status: Home / Self Care | Payer: Self-pay | Source: Home / Self Care | Attending: Internal Medicine

## 2020-08-13 ENCOUNTER — Encounter (HOSPITAL_COMMUNITY): Payer: Self-pay | Admitting: Critical Care Medicine

## 2020-08-13 ENCOUNTER — Inpatient Hospital Stay (HOSPITAL_COMMUNITY): Payer: Medicaid Other | Admitting: Certified Registered Nurse Anesthetist

## 2020-08-13 ENCOUNTER — Inpatient Hospital Stay (HOSPITAL_COMMUNITY): Payer: Medicaid Other

## 2020-08-13 DIAGNOSIS — I340 Nonrheumatic mitral (valve) insufficiency: Secondary | ICD-10-CM

## 2020-08-13 DIAGNOSIS — I351 Nonrheumatic aortic (valve) insufficiency: Secondary | ICD-10-CM

## 2020-08-13 DIAGNOSIS — I2609 Other pulmonary embolism with acute cor pulmonale: Secondary | ICD-10-CM | POA: Diagnosis not present

## 2020-08-13 DIAGNOSIS — I4891 Unspecified atrial fibrillation: Secondary | ICD-10-CM | POA: Diagnosis not present

## 2020-08-13 DIAGNOSIS — I10 Essential (primary) hypertension: Secondary | ICD-10-CM | POA: Diagnosis not present

## 2020-08-13 DIAGNOSIS — J449 Chronic obstructive pulmonary disease, unspecified: Secondary | ICD-10-CM | POA: Diagnosis not present

## 2020-08-13 HISTORY — PX: TEE WITHOUT CARDIOVERSION: SHX5443

## 2020-08-13 HISTORY — PX: CARDIOVERSION: SHX1299

## 2020-08-13 LAB — BASIC METABOLIC PANEL
Anion gap: 11 (ref 5–15)
BUN: 16 mg/dL (ref 6–20)
CO2: 29 mmol/L (ref 22–32)
Calcium: 8.9 mg/dL (ref 8.9–10.3)
Chloride: 90 mmol/L — ABNORMAL LOW (ref 98–111)
Creatinine, Ser: 1.04 mg/dL (ref 0.61–1.24)
GFR, Estimated: >60 mL/min (ref >60)
Glucose, Bld: 155 mg/dL — ABNORMAL HIGH (ref 70–99)
Potassium: 4.7 mmol/L (ref 3.5–5.1)
Sodium: 130 mmol/L — ABNORMAL LOW (ref 135–145)

## 2020-08-13 LAB — CBC
HCT: 45.7 % (ref 39.0–52.0)
Hemoglobin: 14.6 g/dL (ref 13.0–17.0)
MCH: 26.5 pg (ref 26.0–34.0)
MCHC: 31.9 g/dL (ref 30.0–36.0)
MCV: 82.9 fL (ref 80.0–100.0)
Platelets: 345 10*3/uL (ref 150–400)
RBC: 5.51 MIL/uL (ref 4.22–5.81)
RDW: 15.7 % — ABNORMAL HIGH (ref 11.5–15.5)
WBC: 9 10*3/uL (ref 4.0–10.5)
nRBC: 0 % (ref 0.0–0.2)

## 2020-08-13 LAB — GLUCOSE, CAPILLARY
Glucose-Capillary: 156 mg/dL — ABNORMAL HIGH (ref 70–99)
Glucose-Capillary: 257 mg/dL — ABNORMAL HIGH (ref 70–99)
Glucose-Capillary: 129 mg/dL — ABNORMAL HIGH (ref 70–99)
Glucose-Capillary: 161 mg/dL — ABNORMAL HIGH (ref 70–99)

## 2020-08-13 LAB — APTT: aPTT: 39 seconds — ABNORMAL HIGH (ref 24–36)

## 2020-08-13 LAB — MRSA NEXT GEN BY PCR, NASAL: MRSA by PCR Next Gen: NOT DETECTED

## 2020-08-13 SURGERY — ECHOCARDIOGRAM, TRANSESOPHAGEAL
Anesthesia: General

## 2020-08-13 MED ORDER — LIDOCAINE 2% (20 MG/ML) 5 ML SYRINGE
INTRAMUSCULAR | Status: DC | PRN
Start: 2020-08-13 — End: 2020-08-13
  Administered 2020-08-13: 40 mg via INTRAVENOUS

## 2020-08-13 MED ORDER — PROPOFOL 500 MG/50ML IV EMUL
INTRAVENOUS | Status: DC | PRN
  Administered 2020-08-13: 50 ug/kg/min via INTRAVENOUS

## 2020-08-13 MED ORDER — TORSEMIDE 20 MG PO TABS
40.0000 mg | ORAL_TABLET | Freq: Two times a day (BID) | ORAL | Status: DC
  Administered 2020-08-13 – 2020-08-15 (×5): 40 mg via ORAL
  Filled 2020-08-13 (×5): qty 2

## 2020-08-13 MED ORDER — PHENYLEPHRINE HCL-NACL 10-0.9 MG/250ML-% IV SOLN
INTRAVENOUS | Status: DC | PRN
  Administered 2020-08-13: 40 ug/min via INTRAVENOUS

## 2020-08-13 MED ORDER — DEXMEDETOMIDINE (PRECEDEX) IN NS 20 MCG/5ML (4 MCG/ML) IV SYRINGE
PREFILLED_SYRINGE | INTRAVENOUS | Status: DC | PRN
  Administered 2020-08-13: 8 ug via INTRAVENOUS
  Administered 2020-08-13: 4 ug via INTRAVENOUS

## 2020-08-13 MED ORDER — PHENYLEPHRINE 40 MCG/ML (10ML) SYRINGE FOR IV PUSH (FOR BLOOD PRESSURE SUPPORT)
PREFILLED_SYRINGE | INTRAVENOUS | Status: DC | PRN
  Administered 2020-08-13 (×2): 80 ug via INTRAVENOUS

## 2020-08-13 MED ORDER — PROPOFOL 10 MG/ML IV BOLUS
INTRAVENOUS | Status: DC | PRN
  Administered 2020-08-13 (×5): 10 mg via INTRAVENOUS

## 2020-08-13 NOTE — Progress Notes (Signed)
Physical Therapy Treatment Patient Details Name: Donald [name] MRN: [id number] DOB: 30-Aug-1960 Today's Date: 08/13/2020    History of Present Illness 60 y.o. male presenting on 08/06/2020 for chest discomfort from palpitations. CXR concerning for pneumonia and possible volume overload. Had syncopal episode in ER witnessed by nurse. Chest CT with bilateral pulm embolism. 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  PMH: HTN, COPD, atrial fibrillation with cardioversion on 03/12/20, systolic CHF,  prediabetes, PE in 02/2020, hx of CVA in 2018, HLD, hx of right sided atrial flutter ablation in 2019.    PT Comments    Pt tolerated overall increased ambulation distance of 100'+150'+150'+100' with RW with seated rest breaks between trials. HR 74, SaO2 94% on room air. Pt is progressing well with mobility.     Follow Up Recommendations  Home health PT     Equipment Recommendations  None recommended by PT    Recommendations for Other Services       Precautions / Restrictions Precautions Precautions: Fall Precaution Comments: CPAP PRN Restrictions Weight Bearing Restrictions: No    Mobility  Bed Mobility               General bed mobility comments: sitting EOB    Transfers Overall transfer level: Needs assistance Equipment used: Rolling walker (2 wheeled) Transfers: Sit to/from Stand Sit to Stand: Supervision         General transfer comment: VCs for hand placement, no physical assist needed  Ambulation/Gait Ambulation/Gait assistance: Supervision Gait Distance (Feet): 150 Feet Assistive device: Rolling walker (2 wheeled) Gait Pattern/deviations: Step-through pattern;Wide base of support;Trunk flexed Gait velocity: decr   General Gait Details: 100' + 150' +150' + 100' with seated rest breaks; HR 75, SaO2 94% on room air, no  loss of balance   Stairs             Wheelchair Mobility    Modified Rankin (Stroke Patients Only)       Balance  Overall balance assessment: Mild deficits observed, not formally tested                                          Cognition Arousal/Alertness: Awake/alert Behavior During Therapy: WFL for tasks assessed/performed Overall Cognitive Status: Within Functional Limits for tasks assessed                                        Exercises      General Comments        Pertinent Vitals/Pain Pain Assessment: 0-10 Pain Score: 6  Pain Location: R mid back (from "sleeping wrong") Pain Descriptors / Indicators: Aching Pain Intervention(s): Limited activity within patient's tolerance;Monitored during session;Repositioned;Heat applied;Patient requesting pain meds-RN notified    Home Living                      Prior Function            PT Goals (current goals can now be found in the care plan section) Acute Rehab PT Goals Patient Stated Goal: go home PT Goal Formulation: With patient Time For Goal Achievement: 08/23/20 Potential to Achieve Goals: Good Progress towards PT goals: Progressing toward goals    Frequency    Min 3X/week      PT Plan Current plan  remains appropriate    Co-evaluation              AM-PAC PT "6 Clicks" Mobility   Outcome Measure  Help needed turning from your back to your side while in a flat bed without using bedrails?: None Help needed moving from lying on your back to sitting on the side of a flat bed without using bedrails?: None Help needed moving to and from a bed to a chair (including a wheelchair)?: None Help needed standing up from a chair using your arms (e.g., wheelchair or bedside chair)?: A Little Help needed to walk in hospital room?: A Little Help needed climbing 3-5 steps with a railing? : A Little 6 Click Score: 21    End of Session   Activity Tolerance: Patient limited by fatigue Patient left: Other (comment);with call bell/phone within reach (EOB per pt request, and this was how pt  was sitting upon PT arrival to room) Nurse Communication: Mobility status;Patient requests pain meds;Other (comment) (pt reports R mid back pain) PT Visit Diagnosis: Other abnormalities of gait and mobility (R26.89);Difficulty in walking, not elsewhere classified (R26.2)     Time: 2836-6294 PT Time Calculation (min) (ACUTE ONLY): 37 min  Charges:  $Gait Training: 23-37 mins                    Ralene Bathe Kistler PT 08/13/2020  Acute Rehabilitation Services Pager [phone] Office [phone]

## 2020-08-13 NOTE — Progress Notes (Signed)
PROGRESS NOTE    Donald [name]  FIE:[id number] DOB: August 28, 1960 DOA: 08/06/2020 PCP: Renaye Rakers, MD    Brief Narrative:  Mr. Nelis was admitted to the hospital with the working diagnosis of acute on chronic right heart failure due to recurrent pulmonary embolism.    60 year old male past medical history for hypertension, COPD, atrial fibrillation, systolic heart failure, pulmonary embolism, CVA, dyslipidemia who presented with chest pain and palpitations.  He had atrial flutter ablation 2019 and cardioversion in 02/22.  He reported 24 hours of severe palpitations, associated with chest pain and dyspnea.  On his initial physical examination his blood pressure was 97/67, heart rate 118, respiratory rate of 29, temperature 98.1, oxygen saturation 91%, heart S1-S2, present, irregularly irregular, lungs with rales at the left lower lobe, no wheezing, positive increased work of breathing, soft abdomen, positive lower extremity edema bilaterally.   Sodium 134, potassium 3.7, chloride 99, bicarb 25, glucose 149, BUN 8, creatinine 1.31, BNP 442, high sensitive troponin 20-21, white count 11.8, hematocrit 13.6, hematocrit 43.1, platelets 240. SARS COVID-19 negative.   Chest radiograph with cardiomegaly, hilar vascular congestion, right base atelectasis.   EKG with 134 bpm, normal axis, normal QTC, atrial fibrillation rhythm, no ST segment T wave changes.   Chest CT with bilateral pulm embolism involving lobar pulmonary arteries and distal on the right and segmental pulmonary arteries on the left.  Positive for acute right heart strain.  Small right pleural effusion with atelectasis.  Right middle lobe groundglass opacity, likely pulmonary hemorrhage/ infarct.   Patient was placed on anticoagulation and admitted to the intensive care unit.   Remained hemodynamically stable, transfer to [facility] on 07/14.   Responding well to diuresis for volume overload.  Pulmonary infarct, with no signs of infection,  continue to hold on antibiotic therapy (pneumonia ruled out).   07/18 cardiac catheterization RV = 46/4 PA = 49/17 (32) PCW = 16 Fick cardiac output/ index= 5.5/ 2,3 PVR= 2.9   Ef 25% Non obstructive CAD Non ischemic cardiomyopathy, pulmonary hypertension.   07/19 TEE cardioversion, synchronized 200J shock with conversion to sinus rhythm.    Assessment & Plan:   Principal Problem:   Atrial fibrillation with RVR (HCC) Active Problems:   Type 2 diabetes mellitus without complication (HCC)   Pulmonary embolism (HCC)   Essential hypertension   COPD (chronic obstructive pulmonary disease) (HCC)   CAP (community acquired pneumonia)   OSA on CPAP   Leukocytosis   Acute pulmonary embolism (HCC)   Acute decompensation of chronic diastolic heart failure (right heart failure), in the setting of acute bilateral pulmonary embolism. Class 2 post capillary pulmonary hypertension. Echocardiogram with LV Ef 30 to 35%, moderate decrease LV systolic function. RV systolic function with moderate reduction. RSVP 46.1 mmHg. Moderate dilatation of bilateral atriums.   Continue to improve volume status, his urine output documented over last 24 hrs is 1,000 ml   Continue heart failure management with Entresto and spironolactone.   Possible start B blockade before discharge. Continue anticoagulation with apixaban, (patient was at home on apixaban, but it is not considered a treatment failure, because he has was not compliant with this medication).     2. Atrial fibrillation. Now converted to sinus rhythm sp cardioversion.    On IV amiodarone and oral digoxin Plan to transition to oral anticoagulation with apixaban.  Follow up on digoxin level.    3. Hx of CVA.  Continue with aspirin and satatin   4. HTN. Systolic blood pressure in the  100s today, continue with Entresto.     5. COPD. No signs of acute clinical exacerbation. On bronchodilator therapy.   6. T2DM.  Patient is tolerating po  well, continue glucose cover and monitoring with insulin sliding scale. Fasting glucose is 155 mg/dl.    7. Obesity class 3/ OSA. BMI is 40,7. Continue with Cpap   8. Diuresis induced hypokalemia./ hypoNatremia. Stable renal function with serum cr at 1,0, K is 4,7 and serum bicarbonate at 29. Continue diuresis with oral loop diuretics, follow up renal functio and electrolytes in am.     Status is: Inpatient  Remains inpatient appropriate because:Inpatient level of care appropriate due to severity of illness  Dispo: The patient is from: Home              Anticipated d/c is to: Home              Patient currently is not medically stable to d/c.   Difficult to place patient No   DVT prophylaxis: Rivaroxaban   Code Status:   Full  Family Communication:  No family at the bedside     Consultants:  Cardiology   Procedures:  Cardiac catheterization TEE DC cardioversion   Subjective: Patient feeling well post cardioversion, no nausea or vomiting, no dyspnea or chest pain, lower extremity edema continue to improve.   Objective: Vitals:   08/13/20 0930 08/13/20 0945 08/13/20 1000 08/13/20 1015  BP: 103/73 120/85 95/62 121/84  Pulse: 80 72 82 80  Resp: 20 18 (!) 21 (!) 26  Temp:      TempSrc:      SpO2: 93% 95% 93% 93%  Weight:      Height:        Intake/Output Summary (Last 24 hours) at 08/13/2020 1115 Last data filed at 08/13/2020 0943 Gross per 24 hour  Intake 4140.53 ml  Output 800 ml  Net 3340.53 ml   Filed Weights   08/11/20 0500 08/12/20 0520 08/13/20 0436  Weight: 124.2 kg 122.8 kg 123.5 kg    Examination:   General: Not in pain or dyspnea. Deconditioned  Neurology: Awake and alert, non focal  E ENT: no pallor, no icterus, oral mucosa moist Cardiovascular: No JVD. S1-S2 present, rhythmic, no gallops, rubs, or murmurs. Bilateral unna boots, with trace bilateral thigh edema. Pulmonary: positive breath sounds bilaterally, no wheezing, rhonchi or  rales. Gastrointestinal. Abdomen soft and non tender Skin. No rashes Musculoskeletal: no joint deformities     Data Reviewed: I have personally reviewed following labs and imaging studies  CBC: Recent Labs  Lab 08/09/20 0327 08/10/20 0307 08/11/20 0243 08/12/20 0326 08/12/20 0807 08/12/20 0815 08/13/20 0243  WBC 9.7 7.7 7.7 8.0  --   --  9.0  HGB 12.5* 12.5* 13.5 14.5 13.9 13.6  15.6 14.6  HCT 39.4 37.9* 41.5 45.1 41.0 40.0  46.0 45.7  MCV 84.4 82.6 82.3 82.6  --   --  82.9  PLT 254 262 288 293  --   --  345   Basic Metabolic Panel: Recent Labs  Lab 08/06/20 1414 08/07/20 0423 08/07/20 0423 08/08/20 0414 08/09/20 0327 08/10/20 0307 08/11/20 0243 08/12/20 0326 08/12/20 0807 08/12/20 0815 08/13/20 0243  NA  --  131*   < > 132* 131* 131* 126* 131* 139 141  135 130*  K  --  3.8   < > 4.4 3.9 3.1* 3.6 4.0 3.1* 2.9*  3.6 4.7  CL  --  98   < > 97* 96* 88*  82* 88*  --   --  90*  CO2  --  22   < > 24 26 31  33* 30  --   --  29  GLUCOSE  --  212*   < > 143* 130* 157* 158* 163*  --   --  155*  BUN  --  13   < > 16 17 18 15 16   --   --  16  CREATININE  --  1.09   < > 1.18 1.11 1.09 0.97 0.99  --   --  1.04  CALCIUM  --  8.3*   < > 8.8* 8.5* 8.6* 8.5* 8.7*  --   --  8.9  MG 1.6* 1.9  --  1.8  --  1.6* 1.9  --   --   --   --   PHOS  --  3.0  --  2.6  --   --   --   --   --   --   --    < > = values in this interval not displayed.   GFR: Estimated Creatinine Clearance: 105.1 mL/min (by C-G formula based on SCr of 1.04 mg/dL). Liver Function Tests: No results for input(s): AST, ALT, ALKPHOS, BILITOT, PROT, ALBUMIN in the last 168 hours. No results for input(s): LIPASE, AMYLASE in the last 168 hours. No results for input(s): AMMONIA in the last 168 hours. Coagulation Profile: Recent Labs  Lab 08/12/20 2051  INR 1.3*   Cardiac Enzymes: No results for input(s): CKTOTAL, CKMB, CKMBINDEX, TROPONINI in the last 168 hours. BNP (last 3 results) No results for input(s):  PROBNP in the last 8760 hours. HbA1C: No results for input(s): HGBA1C in the last 72 hours. CBG: Recent Labs  Lab 08/12/20 0533 08/12/20 1020 08/12/20 1554 08/12/20 2126 08/13/20 0620  GLUCAP 156* 209* 179* 189* 156*   Lipid Profile: No results for input(s): CHOL, HDL, LDLCALC, TRIG, CHOLHDL, LDLDIRECT in the last 72 hours. Thyroid Function Tests: No results for input(s): TSH, T4TOTAL, FREET4, T3FREE, THYROIDAB in the last 72 hours. Anemia Panel: No results for input(s): VITAMINB12, FOLATE, FERRITIN, TIBC, IRON, RETICCTPCT in the last 72 hours.    Radiology Studies: I have reviewed all of the imaging during this hospital visit personally     Scheduled Meds:  apixaban  10 mg Oral BID   Followed by   2127 ON 08/19/2020] apixaban  5 mg Oral BID   Chlorhexidine Gluconate Cloth  6 each Topical Daily   digoxin  0.125 mg Oral Daily   feeding supplement (GLUCERNA SHAKE)  237 mL Oral TID BM   guaiFENesin  600 mg Oral BID   insulin aspart  0-9 Units Subcutaneous TID WC    morphine injection  2 mg Intravenous Once   sacubitril-valsartan  1 tablet Oral BID   sodium chloride flush  3 mL Intravenous Q12H   sodium chloride flush  3 mL Intravenous Q12H   sodium chloride flush  3 mL Intravenous Q12H   spironolactone  12.5 mg Oral Daily   torsemide  40 mg Oral BID   Continuous Infusions:  sodium chloride     sodium chloride     amiodarone 30 mg/hr (08/13/20 0910)     LOS: 7 days        Marrell Dicaprio 08/21/2020, MD

## 2020-08-13 NOTE — CV Procedure (Signed)
   TRANSESOPHAGEAL ECHOCARDIOGRAM GUIDED DIRECT CURRENT CARDIOVERSION  NAME:  DEVANTA Delle Andrzejewski   MRN: [id number] DOB:  Apr 21, 1960   ADMIT DATE: 08/06/2020  INDICATIONS:  Atrial fibrillation  PROCEDURE:   Informed consent was obtained prior to the procedure. The risks, benefits and alternatives for the procedure were discussed and the patient comprehended these risks.  Risks include, but are not limited to, cough, sore throat, vomiting, nausea, somnolence, esophageal and stomach trauma or perforation, bleeding, low blood pressure, aspiration, pneumonia, infection, trauma to the teeth and death.    After a procedural time-out, the oropharynx was anesthetized and the patient was sedated by the anesthesia service. The transesophageal probe was inserted in the esophagus and stomach without difficulty and multiple views were obtained.   FINDINGS:  LEFT VENTRICLE: EF = 20% global HK  RIGHT VENTRICLE: Severe HK  LEFT ATRIUM: Severely dilated  LEFT ATRIAL APPENDAGE: + smoke. No clot  RIGHT ATRIUM: Severely dilated  AORTIC VALVE:  [place].  Mild AI  MITRAL VALVE:    Mild MR  TRICUSPID VALVE: Mild TR  PULMONIC VALVE: Trivial PR  INTERATRIAL SEPTUM: No PFO/ASD  PERICARDIUM: No effusion   DESCENDING AORTA: + severe smoke. Mild plaque   CARDIOVERSION:     Indications:  Atrial Fibrillation  Procedure Details:  Once the TEE was complete, the patient had the defibrillator pads placed in the anterior and posterior position. Once an appropriate level of sedation was achieved, the patient received a single biphasic, synchronized 200J shock with prompt conversion to sinus rhythm. No apparent complications.   Arvilla Meres, MD  8:44 AM

## 2020-08-13 NOTE — Anesthesia Preprocedure Evaluation (Addendum)
Anesthesia Evaluation  Patient identified by MRN, date of birth, ID band Patient awake    Reviewed: Allergy & Precautions, NPO status , Patient's Chart, lab work & pertinent test results  Airway Mallampati: III  TM Distance: >3 FB Neck ROM: Full    Dental  (+) Dental Advisory Given, Missing   Pulmonary sleep apnea , COPD, former smoker,    breath sounds clear to auscultation       Cardiovascular hypertension, +CHF   Rhythm:Irregular Rate:Tachycardia  Echo:  1. Left ventricular ejection fraction, by estimation, is 30 to 35%. The  left ventricle has moderately decreased function. The left ventricle  demonstrates global hypokinesis. The left ventricular internal cavity size  was mildly dilated. There is mild  left ventricular hypertrophy. Left ventricular diastolic parameters are  indeterminate.  2. Right ventricular systolic function is moderately reduced. The right  ventricular size is moderately enlarged. There is moderately elevated  pulmonary artery systolic pressure. The estimated right ventricular  systolic pressure is 46.1 mmHg.  3. Left atrial size was moderately dilated.  4. Right atrial size was moderately dilated.  5. The mitral valve is normal in structure. Mild mitral valve  regurgitation. No evidence of mitral stenosis.  6. The aortic valve is tricuspid. Aortic valve regurgitation is trivial.  Mild to moderate aortic valve sclerosis/calcification is present, without  any evidence of aortic stenosis.  7. Aortic dilatation noted. There is mild dilatation of the ascending  aorta, measuring 42 mm.  8. The inferior vena cava is dilated in size with <50% respiratory  variability, suggesting right atrial pressure of 15 mmHg.  9. The patient is in atrial fibrillation.    Neuro/Psych CVA negative psych ROS   GI/Hepatic negative GI ROS, Neg liver ROS,   Endo/Other  diabetes  Renal/GU negative Renal ROS      Musculoskeletal negative musculoskeletal ROS (+)   Abdominal Normal abdominal exam  (+)   Peds  Hematology negative hematology ROS (+)   Anesthesia Other Findings   Reproductive/Obstetrics                          Anesthesia Physical Anesthesia Plan  ASA: 4  Anesthesia Plan: General   Post-op Pain Management:    Induction: Intravenous  PONV Risk Score and Plan: 2 and Ondansetron and Propofol infusion  Airway Management Planned: Natural Airway and Simple Face Mask  Additional Equipment: None  Intra-op Plan:   Post-operative Plan:   Informed Consent:   Plan Discussed with: CRNA  Anesthesia Plan Comments:        Anesthesia Quick Evaluation

## 2020-08-13 NOTE — Progress Notes (Signed)
Pt self administers CPAP. 

## 2020-08-13 NOTE — Transfer of Care (Signed)
Immediate Anesthesia Transfer of Care Note  Patient: Donald [name]  Procedure(s) Performed: TRANSESOPHAGEAL ECHOCARDIOGRAM (TEE) CARDIOVERSION  Patient Location: Endoscopy Unit  Anesthesia Type:MAC  Level of Consciousness: drowsy  Airway & Oxygen Therapy: Patient Spontanous Breathing and Patient connected to nasal cannula oxygen  Post-op Assessment: Report given to RN and Post -op Vital signs reviewed and stable  Post vital signs: Reviewed and stable  Last Vitals:  Vitals Value Taken Time  BP 106/74 08/13/20 0818  Temp    Pulse 57 08/13/20 0819  Resp 28 08/13/20 0819  SpO2 94 % 08/13/20 0819  Vitals shown include unvalidated device data.  Last Pain:  Vitals:   08/13/20 0713  TempSrc: Oral  PainSc: 0-No pain         Complications: No notable events documented.

## 2020-08-13 NOTE — Progress Notes (Signed)
Advanced Heart Failure Rounding Note  PCP-Cardiologist: Dr Rudolpho Sevin    Patient Profile    [name] is a 60 year old with a history of a fib, A fib ablation, CVA 2018, COPD, OSA, HTN, obesity, PE 02/2020, and chronic systolic heart failure (Echo 2/22 EF 40-45%).   Admitted with chest pain,  A fib RVR, acute PE and a/c CHF - Chest CT showed acute bilateral submassive PE w/ RV strain.  - Echo EF down 30-35% with RV moerately reduced - LE Doppler Negative for DVT   Subjective:     Cath yesterday with non-obstructive CAD. Filling pressures looked good.   Remains in AF with RVR on IV amio and Eliquis  Denies  SOB, orthopnea or PND   Objective:   Weight Range: 123.5 kg Body mass index is 36.93 kg/m.   Vital Signs:   Temp:  [98 F (36.7 C)-98.5 F (36.9 C)] 98.3 F (36.8 C) (07/19 0713) Pulse Rate:  [61-131] 99 (07/19 0713) Resp:  [16-67] 23 (07/19 0713) BP: (102-169)/(81-121) 136/105 (07/19 0733) SpO2:  [91 %-98 %] 93 % (07/19 0713) Weight:  [123.5 kg] 123.5 kg (07/19 0436) Last BM Date: 08/11/20  Weight change: Filed Weights   08/11/20 0500 08/12/20 0520 08/13/20 0436  Weight: 124.2 kg 122.8 kg 123.5 kg    Intake/Output:   Intake/Output Summary (Last 24 hours) at 08/13/2020 0748 Last data filed at 08/13/2020 4656 Gross per 24 hour  Intake 3503.53 ml  Output 1100 ml  Net 2403.53 ml       Physical Exam    PHYSICAL EXAM: General:  Well appearing. No resp difficulty HEENT: normal Neck: supple. JVP 6-7 Carotids 2+ bilat; no bruits. No lymphadenopathy or thryomegaly appreciated. Cor: PMI nondisplaced. Irregular tachy  No rubs, gallops or murmurs. Lungs: clear Abdomen: obese soft, nontender, nondistended. No hepatosplenomegaly. No bruits or masses. Good bowel sounds. Extremities: no cyanosis, clubbing, rash, edema Neuro: alert & orientedx3, cranial nerves grossly intact. moves all 4 extremities w/o difficulty. Affect pleasant  Telemetry   AF 95-110  Personally reviewed  Labs    CBC Recent Labs    08/12/20 0326 08/12/20 0807 08/12/20 0815 08/13/20 0243  WBC 8.0  --   --  9.0  HGB 14.5   < > 13.6  15.6 14.6  HCT 45.1   < > 40.0  46.0 45.7  MCV 82.6  --   --  82.9  PLT 293  --   --  345   < > = values in this interval not displayed.    Basic Metabolic Panel Recent Labs    81/27/51 0243 08/12/20 0326 08/12/20 0807 08/12/20 0815 08/13/20 0243  NA 126* 131*   < > 141  135 130*  K 3.6 4.0   < > 2.9*  3.6 4.7  CL 82* 88*  --   --  90*  CO2 33* 30  --   --  29  GLUCOSE 158* 163*  --   --  155*  BUN 15 16  --   --  16  CREATININE 0.97 0.99  --   --  1.04  CALCIUM 8.5* 8.7*  --   --  8.9  MG 1.9  --   --   --   --    < > = values in this interval not displayed.    Liver Function Tests No results for input(s): AST, ALT, ALKPHOS, BILITOT, PROT, ALBUMIN in the last 72 hours. No results for input(s): LIPASE,  AMYLASE in the last 72 hours. Cardiac Enzymes No results for input(s): CKTOTAL, CKMB, CKMBINDEX, TROPONINI in the last 72 hours.  BNP: BNP (last 3 results) Recent Labs    03/18/20 1633 08/06/20 0754 08/07/20 0423  BNP 254.6* 442.4* 650.8*     ProBNP (last 3 results) No results for input(s): PROBNP in the last 8760 hours.   D-Dimer No results for input(s): DDIMER in the last 72 hours. Hemoglobin A1C No results for input(s): HGBA1C in the last 72 hours.  Fasting Lipid Panel No results for input(s): CHOL, HDL, LDLCALC, TRIG, CHOLHDL, LDLDIRECT in the last 72 hours. Thyroid Function Tests No results for input(s): TSH, T4TOTAL, T3FREE, THYROIDAB in the last 72 hours.  Invalid input(s): FREET3   Other results:   Imaging    CARDIAC CATHETERIZATION  Result Date: 08/12/2020 Formatting of this result is different from the original.   Mid RCA lesion is 60% stenosed.   Prox RCA lesion is 30% stenosed.   RPDA lesion is 30% stenosed.   1st Mrg lesion is 30% stenosed.   Prox LAD to Mid LAD lesion is 20%  stenosed.   2nd Diag lesion is 40% stenosed.   The left ventricular ejection fraction is less than 25% by visual estimate. Findings: Ao = 109/81 (93) LV = 102/9 RA =  7 RV = 46/4 PA = 49/17 (32) PCW = 16 (v = 25) Fick cardiac output/index = 5.5/2.3 PVR = 2.9 Ao sat = 97% PA sat = 67%,68% Assessment: Mild pulmonary HTN Filling pressures improved Low-normal cardiac output Non-obstructive CAD Severe NICM EF ~25% Plan/Discussion: Continue to titrate GDMT. Proceed with TEE/DC-CV. Arvilla Meres, MD 8:47 AM    Medications:     Scheduled Medications:  [MAR Hold] apixaban  10 mg Oral BID   Followed by   Mitzi Hansen Hold] apixaban  5 mg Oral BID   [MAR Hold] Chlorhexidine Gluconate Cloth  6 each Topical Daily   [MAR Hold] digoxin  0.125 mg Oral Daily   [MAR Hold] feeding supplement (GLUCERNA SHAKE)  237 mL Oral TID BM   [MAR Hold] guaiFENesin  600 mg Oral BID   [MAR Hold] insulin aspart  0-9 Units Subcutaneous TID WC   [MAR Hold]  morphine injection  2 mg Intravenous Once   [MAR Hold] potassium chloride  40 mEq Oral BID   [MAR Hold] sacubitril-valsartan  1 tablet Oral BID   [MAR Hold] sodium chloride flush  3 mL Intravenous Q12H   [MAR Hold] sodium chloride flush  3 mL Intravenous Q12H   [MAR Hold] sodium chloride flush  3 mL Intravenous Q12H   [MAR Hold] spironolactone  12.5 mg Oral Daily   [MAR Hold] torsemide  40 mg Oral Daily    Infusions:  [MAR Hold] sodium chloride     [MAR Hold] sodium chloride     sodium chloride 20 mL/hr at 08/13/20 0252   amiodarone 60 mg/hr (08/13/20 0656)    PRN Medications: [MAR Hold] sodium chloride, [MAR Hold] sodium chloride, [MAR Hold] acetaminophen, [MAR Hold] cyclobenzaprine, [MAR Hold] docusate sodium, [MAR Hold] levalbuterol, [MAR Hold] metoprolol tartrate, [MAR Hold] ondansetron (ZOFRAN) IV, [MAR Hold] polyethylene glycol, [MAR Hold] sodium chloride flush, [MAR Hold] sodium chloride flush, [MAR Hold] zolpidem    Patient Profile    [name] is a 60 year  old with a history of a fib, A fib ablation, CVA 2018, COPD, OSA, HTN, obesity, PE 02/2020, and chronic systolic heart failure.   Admitted with chest pain and A fib RVR,  acute PE and a/c CHF.   Assessment/Plan   1. A/C Systolic Heart Failure  Echo 02/2020 EF 40-45%--> ECHO repeat and showed EF down 30-35% with RV moerately reduced, and RA/LA moderately dilated. - Cath 08/12/20 non-obstructive CAD. Weight down over 30 pounds - Continue spiro 12.5 daily  - Continue digoxin 0.125 mg daily.  - Continue Entresto 24/26 bid - Adjust diuretics as needed - No b-blocker yet - Continue unna boots.  - TEE/DC-CV today  2. Recurrent PE -  H/O PE back in February 2022. He was not taking eliquis.  - CTA this admit  --> Now with acute bilateral submassive PE.  - L/E [place] negative -> No indication for filter placement. --negative for DVT.  - Now on Eliquis with loading dose  3. A fib RVR  - Had DC-CV 03/12/20  - Admitted witth recurrent A fib RVR. 7/14  - Now on amio gtt. Rate improved but still fast at times - Eliquis restarted 08/12/20 - TEE/DC-CV today  4. OSA - On CPAP.   5. Hypokalemia - resolved, K 4.7 today  - supp PRN   6. Obesity Body mass index is 36.93 kg/m.  7.  H/O embolic CVA  - plan a/c per above   8. SDOH needs - He has been referred to HF Paramedicine program.  Consult cardiac rehab.  - Medication concerns reviewed with patient and pharmacy team. Barriers identified: Yes med complicance. Will need referral to HF Paramedicine. Lives in [place]. Of note he cancelled his last 2 appointments in HF clinic back in March.    Length of Stay: 7  Arvilla Meres, MD  08/13/2020, 7:48 AM  Advanced Heart Failure Team Pager [phone] (M-F; 7a - 5p)  Please contact [facility] for night-coverage after hours (5p -7a ) and weekends on [email or web address]

## 2020-08-13 NOTE — Progress Notes (Signed)
  Echocardiogram Echocardiogram Transesophageal has been performed.  Donald [name] 08/13/2020, 8:51 AM

## 2020-08-13 NOTE — Interval H&P Note (Signed)
History and Physical Interval Note:  08/13/2020 7:48 AM  Donald [name]  has presented today for surgery, with the diagnosis of afib.  The various methods of treatment have been discussed with the patient and family. After consideration of risks, benefits and other options for treatment, the patient has consented to  Procedure(s): TRANSESOPHAGEAL ECHOCARDIOGRAM (TEE) (N/A) CARDIOVERSION (N/A) as a surgical intervention.  The patient's history has been reviewed, patient examined, no change in status, stable for surgery.  I have reviewed the patient's chart and labs.  Questions were answered to the patient's satisfaction.     Sherill Mangen

## 2020-08-13 NOTE — Plan of Care (Signed)

## 2020-08-14 ENCOUNTER — Telehealth: Payer: Self-pay | Admitting: Family Medicine

## 2020-08-14 DIAGNOSIS — I10 Essential (primary) hypertension: Secondary | ICD-10-CM | POA: Diagnosis not present

## 2020-08-14 DIAGNOSIS — I2609 Other pulmonary embolism with acute cor pulmonale: Secondary | ICD-10-CM | POA: Diagnosis not present

## 2020-08-14 DIAGNOSIS — I4891 Unspecified atrial fibrillation: Secondary | ICD-10-CM | POA: Diagnosis not present

## 2020-08-14 DIAGNOSIS — J449 Chronic obstructive pulmonary disease, unspecified: Secondary | ICD-10-CM | POA: Diagnosis not present

## 2020-08-14 LAB — CBC
HCT: 47.3 % (ref 39.0–52.0)
Hemoglobin: 15.1 g/dL (ref 13.0–17.0)
MCH: 26.4 pg (ref 26.0–34.0)
MCHC: 31.9 g/dL (ref 30.0–36.0)
MCV: 82.7 fL (ref 80.0–100.0)
Platelets: 315 10*3/uL (ref 150–400)
RBC: 5.72 MIL/uL (ref 4.22–5.81)
RDW: 15.9 % — ABNORMAL HIGH (ref 11.5–15.5)
WBC: 8.3 10*3/uL (ref 4.0–10.5)
nRBC: 0 % (ref 0.0–0.2)

## 2020-08-14 LAB — APTT: aPTT: 39 seconds — ABNORMAL HIGH (ref 24–36)

## 2020-08-14 LAB — GLUCOSE, CAPILLARY
Glucose-Capillary: 185 mg/dL — ABNORMAL HIGH (ref 70–99)
Glucose-Capillary: 129 mg/dL — ABNORMAL HIGH (ref 70–99)
Glucose-Capillary: 164 mg/dL — ABNORMAL HIGH (ref 70–99)
Glucose-Capillary: 135 mg/dL — ABNORMAL HIGH (ref 70–99)

## 2020-08-14 LAB — DIGOXIN LEVEL: Digoxin Level: 0.5 ng/mL — ABNORMAL LOW (ref 0.8–2.0)

## 2020-08-14 LAB — BASIC METABOLIC PANEL
Anion gap: 12 (ref 5–15)
BUN: 19 mg/dL (ref 6–20)
CO2: 28 mmol/L (ref 22–32)
Calcium: 8.8 mg/dL — ABNORMAL LOW (ref 8.9–10.3)
Chloride: 91 mmol/L — ABNORMAL LOW (ref 98–111)
Creatinine, Ser: 1.04 mg/dL (ref 0.61–1.24)
GFR, Estimated: >60 mL/min (ref >60)
Glucose, Bld: 134 mg/dL — ABNORMAL HIGH (ref 70–99)
Potassium: 4.5 mmol/L (ref 3.5–5.1)
Sodium: 131 mmol/L — ABNORMAL LOW (ref 135–145)

## 2020-08-14 MED ORDER — HYALURONIDASE HUMAN 150 UNIT/ML IJ SOLN
150.0000 [IU] | Freq: Once | INTRAMUSCULAR | Status: AC
  Administered 2020-08-14: 150 [IU] via SUBCUTANEOUS
  Filled 2020-08-14: qty 1

## 2020-08-14 MED ORDER — AMIODARONE HCL 200 MG PO TABS
200.0000 mg | ORAL_TABLET | Freq: Two times a day (BID) | ORAL | Status: DC
  Administered 2020-08-14 – 2020-08-15 (×3): 200 mg via ORAL
  Filled 2020-08-14 (×3): qty 1

## 2020-08-14 MED ORDER — HYALURONIDASE OVINE 200 UNIT/ML IJ SOLN
200.0000 [IU] | Freq: Once | INTRAMUSCULAR | Status: DC
  Filled 2020-08-14: qty 1

## 2020-08-14 NOTE — Progress Notes (Signed)
Inpatient Diabetes Program Recommendations  AACE/ADA: New Consensus Statement on Inpatient Glycemic Control (2015)  Target Ranges:  Prepandial:   less than 140 mg/dL      Peak postprandial:   less than 180 mg/dL (1-2 hours)      Critically ill patients:  140 - 180 mg/dL   Lab Results  Component Value Date   GLUCAP 185 (H) 08/14/2020   HGBA1C 7.2 (H) 08/09/2020   Review of Glycemic Control  Results for Donald [name], Donald [name] (MRN [id number]) as of 08/14/2020 10:03  Ref. Range 08/13/2020 06:20 08/13/2020 11:19 08/13/2020 15:58 08/13/2020 21:03 08/14/2020 05:59  Glucose-Capillary Latest Ref Range: 70 - 99 mg/dL 156 (H) 257 (H) 129 (H) 161 (H) 185 (H)   Diabetes history: DM2 (dx in February 2022 during last hospital admission) Outpatient Diabetes medications: None Current orders for Inpatient glycemic control: Novolog 0-9 units TID with meals   Inpatient Diabetes Program Recommendations:     HbgA1C: Current A1C 7.2% on 08/06/20. Noted A1C of 7.4% on 03/10/20. Patient was inpatient 03/08/20-03/12/20 and newly dx with DM at that time. Patient seen by inpatient diabetes coordinator on 03/11/20 regarding new DM dx.     Outpatient: Would recommend prescribing glucose monitoring kit (#[id number]) and oral DM medication as outpatient.  Inpatient Diabetes Program Recommendations:    - increase Novolog to "moderate" 0-15 units tid + hs scale.  Thanks,  Tama Headings RN, MSN, BC-ADM Inpatient Diabetes Coordinator Team Pager [phone] (8a-5p)

## 2020-08-14 NOTE — TOC Initial Note (Signed)
Transition of Care (TOC) - Initial/Assessment Note  Heart Failure   Patient Details  Name: Donald [name] MRN: [id number] Date of Birth: 13-Oct-1960  Transition of Care [facility]) CM/SW Contact:    Roshanna Cimino, LCSWA Phone Number: 08/14/2020, 9:46 AM  Clinical Narrative:                 CSW spoke with patient at bedside and completed a very brief SDOH screening with the patient who reported that he receives Food Stamps roughly $250 a month and he also receives disability as well but that by the time he pays his bills he doesn't have much left. [name] reported that his gas bill is past due and he received a red notice roughly $143 that he needs to pay. [name] reported that he doesn't have transportation and that he rides his bike to get around. CSW obtained his signature for the [employer] form for CSW to enroll him in [employer] [place] transportation. CSW provided Mr. Stay with their number to call for transportation needs to any [facility] related appointment. CSW provided [name] with an appointment card for the [facility] outpatient clinic and encouraged him to follow up and to attend the appointment and bring his medications and if anything changes to please reach out so that CSW/HV clinic team can provide support. CSW will arrange transportation with [facility] for this appointment.  Expected Discharge Plan: Home w Home Health Services Barriers to Discharge: Continued Medical Work up   Patient Goals and CMS Choice        Expected Discharge Plan and Services Expected Discharge Plan: Home w Home Health Services In-house Referral: Clinical Social Work     Living arrangements for the past 2 months: Single Family Home                                      Prior Living Arrangements/Services Living arrangements for the past 2 months: Single Family Home Lives with:: Self Patient language and need for interpreter reviewed:: Yes        Need for Family Participation in Patient  Care: No (Comment) Care giver support system in place?: No (comment)   Criminal Activity/Legal Involvement Pertinent to Current Situation/Hospitalization: No - Comment as needed  Activities of Daily Living Home Assistive Devices/Equipment: CPAP ADL Screening (condition at time of admission) Patient's cognitive ability adequate to safely complete daily activities?: Yes Is the patient deaf or have difficulty hearing?: No Does the patient have difficulty seeing, even when wearing glasses/contacts?: No Does the patient have difficulty concentrating, remembering, or making decisions?: No Patient able to express need for assistance with ADLs?: No Does the patient have difficulty dressing or bathing?: No Independently performs ADLs?: Yes (appropriate for developmental age) Does the patient have difficulty walking or climbing stairs?: No Weakness of Legs: None Weakness of Arms/Hands: None  Permission Sought/Granted                  Emotional Assessment Appearance:: Appears stated age Attitude/Demeanor/Rapport: Engaged Affect (typically observed): Pleasant Orientation: : Oriented to Self, Oriented to  Time, Oriented to Place, Oriented to Situation   Psych Involvement: No (comment)  Admission diagnosis:  Acute pulmonary embolism (HCC) [I26.99] Atrial fibrillation with RVR (HCC) [I48.91] Community acquired pneumonia of right lower lobe of lung [J18.9] Patient Active Problem List   Diagnosis Date Noted   CAP (community acquired pneumonia) 08/06/2020   OSA  on CPAP 08/06/2020   Leukocytosis 08/06/2020   Acute pulmonary embolism (HCC) 08/06/2020   Atrial fibrillation with RVR (HCC) 03/08/2020   Pulmonary embolism (HCC) 03/08/2020   Essential hypertension 03/08/2020   COPD (chronic obstructive pulmonary disease) (HCC) 03/08/2020   Anemia 03/08/2020   COPD exacerbation (HCC) 11/16/2014   Atypical chest pain 11/16/2014   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   Paroxysmal atrial  fibrillation (HCC) 11/16/2014   Acute bronchitis 11/16/2014   Abnormal EKG 11/16/2014   Type 2 diabetes mellitus without complication (HCC) 11/17/2012   Perirectal abscess 10/20/2012   PCP:  Renaye Rakers, MD Pharmacy:   [facility] [address] ([address]), [place] - [address] [place] ([place]) [place] [address] Phone: [phone] Fax: [phone]  [facility] - [place], [place] - [address] [place] [address] Phone: [phone] Fax: [phone]  [facility] [address] [place] [address] Phone: [phone] Fax: [phone]  [facility] [address] [place] [address] Phone: [phone] Fax: [phone]     Social Determinants of Health (SDOH) Interventions Food Insecurity Interventions: Other (Comment) (Pt. reports recieving Food Stamps about $250 a month) Financial Strain Interventions: Other (Comment) (Referral to HF outpatient clinic and medication assistance) Housing Interventions: Intervention Not Indicated Transportation Interventions: [facility]  Readmission Risk Interventions No flowsheet data found.  Ikesha Siller, MSW, LCSWA [phone] Heart Failure Social Worker

## 2020-08-14 NOTE — Progress Notes (Signed)
PROGRESS NOTE    Donald [name]  YBW:[id number] DOB: 1960/12/29 DOA: 08/06/2020 PCP: Renaye Rakers, MD    Brief Narrative:  60 year old male past medical history for hypertension, COPD, atrial fibrillation, systolic heart failure, pulmonary embolism, CVA, dyslipidemia who presented with chest pain and palpitations.  He had atrial flutter ablation 2019 and cardioversion in 02/22.  He reported 24 hours of severe palpitations, associated with chest pain and dyspnea.  On his initial physical examination his blood pressure was 97/67, heart rate 118, respiratory rate of 29, oxygen saturation 91%, positive increased work of breathing, soft abdomen, positive lower extremity edema bilaterally. Labs with BNP 442. Chest radiograph with cardiomegaly, hilar vascular congestion, right base atelectasis. Chest CT with bilateral pulm embolism involving lobar pulmonary arteries and distal on the right and segmental pulmonary arteries on the left.  Positive for acute right heart strain. Right middle lobe groundglass opacity, likely pulmonary hemorrhage/ infarct. Patient was placed on anticoagulation and admitted to the intensive care unit. Remained hemodynamically stable, transfer to [facility] on 07/14.      Assessment & Plan:   Principal Problem:   Atrial fibrillation with RVR (HCC) Active Problems:   Type 2 diabetes mellitus without complication (HCC)   Pulmonary embolism (HCC)   Essential hypertension   COPD (chronic obstructive pulmonary disease) (HCC)   CAP (community acquired pneumonia)   OSA on CPAP   Leukocytosis   Acute pulmonary embolism (HCC)   Acute decompensation of chronic systolic heart failure in the setting of acute/recurrent bilateral PE, with pulmonary hypertension. Echocardiogram with LV Ef 30 to 35%, moderate decrease LV systolic function. RV systolic function with moderate reduction CTA chest with submassive PE Bilateral lower extremity Doppler negative for DVT Cardiac cath 08/12/2020  showed nonobstructive CAD Cardiology/HF team on board Continue torsemide, spironolactone, digoxin, Entresto Continue anticoagulation with apixaban, (patient was at home on apixaban, but it is not considered a treatment failure, because he has was not compliant with this medication).     Paroxysmal A. fib Now converted to sinus rhythm sp cardioversion on 7/19 2022 Continue amiodarone, digoxin, Eliquis  Possible amiodarone infiltration on bilateral forearms Noted red, tender and warm bilateral forearm, likely due to amiodarone infiltration Hylenex injected around infiltrated area Monitor closely   Hx of CVA Continue with aspirin and satatin   HTN Continue BP meds as above   COPD No signs of acute clinical exacerbation. On bronchodilator therapy   T2DM Patient is tolerating po well, continue glucose cover and monitoring with insulin sliding scale.  OSA Continue CPAP   Obesity Lifestyle modification advised     Status is: Inpatient  Remains inpatient appropriate because:Inpatient level of care appropriate due to severity of illness  Dispo: The patient is from: Home              Anticipated d/c is to: Home              Patient currently is not medically stable to d/c.   Difficult to place patient No   DVT prophylaxis: Eliquis Code Status:   Full  Family Communication:  No family at the bedside     Consultants:  Cardiology   Procedures:  Cardiac catheterization TEE DC cardioversion   Subjective: Patient reports pain, erythema, redness around bilateral forearm due to possible infiltration of amiodarone, otherwise denies any new complaints.  Objective: Vitals:   08/14/20 0054 08/14/20 0201 08/14/20 0440 08/14/20 1100  BP: 120/68  105/77 129/90  Pulse: 80  74 74  Resp: 20  19 18  Temp: 98.9 F (37.2 C)  98.9 F (37.2 C) 98.8 F (37.1 C)  TempSrc: Oral  Oral Oral  SpO2: 94% 90% 94% 95%  Weight:   121.1 kg   Height:        Intake/Output Summary (Last 24  hours) at 08/14/2020 1801 Last data filed at 08/14/2020 1754 Gross per 24 hour  Intake 2268.43 ml  Output 4250 ml  Net -1981.57 ml   Filed Weights   08/12/20 0520 08/13/20 0436 08/14/20 0440  Weight: 122.8 kg 123.5 kg 121.1 kg    Examination:  General: NAD  Cardiovascular: S1, S2 present Respiratory: CTAB Abdomen: Soft, nontender, nondistended, bowel sounds present Musculoskeletal: No bilateral pedal edema, noted bilateral Unna boots Skin: Noted erythema, warmth with pain around bilateral forearm insertion site Psychiatry: Normal mood       Data Reviewed: I have personally reviewed following labs and imaging studies  CBC: Recent Labs  Lab 08/10/20 0307 08/11/20 0243 08/12/20 0326 08/12/20 0807 08/12/20 0815 08/13/20 0243 08/14/20 0305  WBC 7.7 7.7 8.0  --   --  9.0 8.3  HGB 12.5* 13.5 14.5 13.9 13.6  15.6 14.6 15.1  HCT 37.9* 41.5 45.1 41.0 40.0  46.0 45.7 47.3  MCV 82.6 82.3 82.6  --   --  82.9 82.7  PLT 262 288 293  --   --  345 315   Basic Metabolic Panel: Recent Labs  Lab 08/08/20 0414 08/09/20 0327 08/10/20 0307 08/11/20 0243 08/12/20 0326 08/12/20 0807 08/12/20 0815 08/13/20 0243 08/14/20 0305  NA 132*   < > 131* 126* 131* 139 141  135 130* 131*  K 4.4   < > 3.1* 3.6 4.0 3.1* 2.9*  3.6 4.7 4.5  CL 97*   < > 88* 82* 88*  --   --  90* 91*  CO2 24   < > 31 33* 30  --   --  29 28  GLUCOSE 143*   < > 157* 158* 163*  --   --  155* 134*  BUN 16   < > 18 15 16   --   --  16 19  CREATININE 1.18   < > 1.09 0.97 0.99  --   --  1.04 1.04  CALCIUM 8.8*   < > 8.6* 8.5* 8.7*  --   --  8.9 8.8*  MG 1.8  --  1.6* 1.9  --   --   --   --   --   PHOS 2.6  --   --   --   --   --   --   --   --    < > = values in this interval not displayed.   GFR: Estimated Creatinine Clearance: 104 mL/min (by C-G formula based on SCr of 1.04 mg/dL). Liver Function Tests: No results for input(s): AST, ALT, ALKPHOS, BILITOT, PROT, ALBUMIN in the last 168 hours. No results for  input(s): LIPASE, AMYLASE in the last 168 hours. No results for input(s): AMMONIA in the last 168 hours. Coagulation Profile: Recent Labs  Lab 08/12/20 2051  INR 1.3*   Cardiac Enzymes: No results for input(s): CKTOTAL, CKMB, CKMBINDEX, TROPONINI in the last 168 hours. BNP (last 3 results) No results for input(s): PROBNP in the last 8760 hours. HbA1C: No results for input(s): HGBA1C in the last 72 hours. CBG: Recent Labs  Lab 08/13/20 1558 08/13/20 2103 08/14/20 0559 08/14/20 1059 08/14/20 1640  GLUCAP 129* 161* 185* 129* 164*  Lipid Profile: No results for input(s): CHOL, HDL, LDLCALC, TRIG, CHOLHDL, LDLDIRECT in the last 72 hours. Thyroid Function Tests: No results for input(s): TSH, T4TOTAL, FREET4, T3FREE, THYROIDAB in the last 72 hours. Anemia Panel: No results for input(s): VITAMINB12, FOLATE, FERRITIN, TIBC, IRON, RETICCTPCT in the last 72 hours.    Radiology Studies: I have reviewed all of the imaging during this hospital visit personally     Scheduled Meds:  amiodarone  200 mg Oral BID   apixaban  10 mg Oral BID   Followed by   Melene Muller ON 08/19/2020] apixaban  5 mg Oral BID   Chlorhexidine Gluconate Cloth  6 each Topical Daily   digoxin  0.125 mg Oral Daily   feeding supplement (GLUCERNA SHAKE)  237 mL Oral TID BM   guaiFENesin  600 mg Oral BID   insulin aspart  0-9 Units Subcutaneous TID WC    morphine injection  2 mg Intravenous Once   sacubitril-valsartan  1 tablet Oral BID   sodium chloride flush  3 mL Intravenous Q12H   sodium chloride flush  3 mL Intravenous Q12H   sodium chloride flush  3 mL Intravenous Q12H   spironolactone  12.5 mg Oral Daily   torsemide  40 mg Oral BID   Continuous Infusions:  sodium chloride     sodium chloride       LOS: 8 days        Briant Cedar, MD

## 2020-08-14 NOTE — Progress Notes (Signed)
Noted right anterior forearm with infiltrate of Amiodarone . Site marked and Hylenex injected around insertion site and infiltrated area. Noted on  left lateral antecubital another infiltrated site that the patient states to have been the same medication. Again marked the site of infiltration. Both sites are red ,tender and warm to touch. Md is aware.

## 2020-08-14 NOTE — Plan of Care (Signed)

## 2020-08-14 NOTE — Progress Notes (Signed)
Upon initial shift assessment pt R arm noted to be very edematous, red, and painful. Pt reported difficulty with IV in that area overnight. No IV was present during assessment though, as covering night shift nurse moved the amnio gtt to the R hand, and removed old PIV. Writer suspected previous infiltration of Amnio gtt, thus paged covering provider as well as IV team for additional assessment of area.

## 2020-08-14 NOTE — Progress Notes (Addendum)
Advanced Heart Failure Rounding Note  PCP-Cardiologist: Dr Rudolpho Sevin    Patient Profile    Donald [name] is a 60 year old with a history of a fib, A fib ablation, CVA 2018, COPD, OSA, HTN, obesity, PE 02/2020, and chronic systolic heart failure (Echo 2/22 EF 40-45%).   Admitted with chest pain,  A fib RVR, acute PE and a/c CHF - Chest CT showed acute bilateral submassive PE w/ RV strain.  - Echo EF down 30-35% with RV moerately reduced - LE Doppler Negative for DVT   Subjective:   7/18 Cath yesterday with non-obstructive CAD. Filling pressures looked good.  7/19 TEE EF 20% + smoke no clot-->DC_CV 200 j x1--> NSR    RUE IV infiltration with amio infusion. RUE forearm erythmea/induration.   Complaining about RUE pain from IV infiltration. Wants to go home.     Objective:   Weight Range: 121.1 kg Body mass index is 36.21 kg/m.   Vital Signs:   Temp:  [98.2 F (36.8 C)-98.9 F (37.2 C)] 98.9 F (37.2 C) (07/20 0440) Pulse Rate:  [72-82] 74 (07/20 0440) Resp:  [15-23] 19 (07/20 0440) BP: (95-141)/(62-109) 105/77 (07/20 0440) SpO2:  [90 %-97 %] 94 % (07/20 0440) Weight:  [121.1 kg] 121.1 kg (07/20 0440) Last BM Date: 08/13/20  Weight change: Filed Weights   08/12/20 0520 08/13/20 0436 08/14/20 0440  Weight: 122.8 kg 123.5 kg 121.1 kg    Intake/Output:   Intake/Output Summary (Last 24 hours) at 08/14/2020 0851 Last data filed at 08/14/2020 0850 Gross per 24 hour  Intake 2352.76 ml  Output 5075 ml  Net -2722.24 ml      Physical Exam    PHYSICAL EXAM: General:  Well appearing. No resp difficulty. SItting on the side of the bed.  HEENT: normal Neck: supple. no JVD. Carotids 2+ bilat; no bruits. No lymphadenopathy or thryomegaly appreciated. Cor: PMI nondisplaced. Regular rate & rhythm. No rubs, gallops or murmurs. Lungs: clear Abdomen: soft, nontender, nondistended. No hepatosplenomegaly. No bruits or masses. Good bowel sounds. Extremities: no cyanosis,  clubbing, rash, edema. RUE forearm red induration from IV infiltration. LUE tender from infiltration last week.  Neuro: alert & orientedx3, cranial nerves grossly intact. moves all 4 extremities w/o difficulty. Affect pleasant  Telemetry   NSR 70s personally reviewed.   Labs    CBC Recent Labs    08/13/20 0243 08/14/20 0305  WBC 9.0 8.3  HGB 14.6 15.1  HCT 45.7 47.3  MCV 82.9 82.7  PLT 345 315   Basic Metabolic Panel Recent Labs    09/73/53 0243 08/14/20 0305  NA 130* 131*  K 4.7 4.5  CL 90* 91*  CO2 29 28  GLUCOSE 155* 134*  BUN 16 19  CREATININE 1.04 1.04  CALCIUM 8.9 8.8*   Liver Function Tests No results for input(s): AST, ALT, ALKPHOS, BILITOT, PROT, ALBUMIN in the last 72 hours. No results for input(s): LIPASE, AMYLASE in the last 72 hours. Cardiac Enzymes No results for input(s): CKTOTAL, CKMB, CKMBINDEX, TROPONINI in the last 72 hours.  BNP: BNP (last 3 results) Recent Labs    03/18/20 1633 08/06/20 0754 08/07/20 0423  BNP 254.6* 442.4* 650.8*    ProBNP (last 3 results) No results for input(s): PROBNP in the last 8760 hours.   D-Dimer No results for input(s): DDIMER in the last 72 hours. Hemoglobin A1C No results for input(s): HGBA1C in the last 72 hours.  Fasting Lipid Panel No results for input(s): CHOL, HDL, LDLCALC, TRIG,  CHOLHDL, LDLDIRECT in the last 72 hours. Thyroid Function Tests No results for input(s): TSH, T4TOTAL, T3FREE, THYROIDAB in the last 72 hours.  Invalid input(s): FREET3   Other results:   Imaging    No results found.   Medications:     Scheduled Medications:  apixaban  10 mg Oral BID   Followed by   Melene Muller ON 08/19/2020] apixaban  5 mg Oral BID   Chlorhexidine Gluconate Cloth  6 each Topical Daily   digoxin  0.125 mg Oral Daily   feeding supplement (GLUCERNA SHAKE)  237 mL Oral TID BM   guaiFENesin  600 mg Oral BID   insulin aspart  0-9 Units Subcutaneous TID WC    morphine injection  2 mg Intravenous  Once   sacubitril-valsartan  1 tablet Oral BID   sodium chloride flush  3 mL Intravenous Q12H   sodium chloride flush  3 mL Intravenous Q12H   sodium chloride flush  3 mL Intravenous Q12H   spironolactone  12.5 mg Oral Daily   torsemide  40 mg Oral BID    Infusions:  sodium chloride     sodium chloride     amiodarone 30 mg/hr (08/14/20 0555)    PRN Medications: sodium chloride, sodium chloride, acetaminophen, cyclobenzaprine, docusate sodium, levalbuterol, metoprolol tartrate, ondansetron (ZOFRAN) IV, polyethylene glycol, sodium chloride flush, sodium chloride flush, zolpidem    Patient Profile    Donald [name] is a 60 year old with a history of a fib, A fib ablation, CVA 2018, COPD, OSA, HTN, obesity, PE 02/2020, and chronic systolic heart failure.   Admitted with chest pain and A fib RVR, acute PE and a/c CHF.   Assessment/Plan   1. A/C Systolic Heart Failure  Echo 02/2020 EF 40-45%--> ECHO repeat and showed EF down 30-35% with RV moerately reduced, and RA/LA moderately dilated. - Cath 08/12/20 non-obstructive CAD. Weight down over 30 pounds - Volume status stable. Continue torsemide 40 mg twice a day  - Increase spironolactone to 25 mg daily - Continue digoxin 0.125 mg daily.  - Continue Entresto 24/26 bid - No b-blocker yet - Can remove unna boots.  - Will consider SGLT2i at his follow up.   2. Recurrent PE -  H/O PE back in February 2022. He was not taking eliquis.  - CTA this admit  --> Now with acute bilateral submassive PE.  - L/E [place] negative -> No indication for filter placement. --negative for DVT.  - Now on Eliquis with loading dose  3. A fib RVR  - Had DC-CV 03/12/20  - Admitted witth recurrent A fib RVR. 7/14  - S/P: DC/CV. Maintaining NSr.  - Stop amio drip and start amio 200 mg twice a day.  - Eliquis restarted 08/12/20 - TEE/DC-CV today  4. OSA - On CPAP.   5. Hypokalemia - resolved, K 4.5 today  - supp PRN   6. Obesity Body mass index is 36.21  kg/m.  7.  H/O embolic CVA  - plan a/c per above   8. RUE IV infiltration Amio infiltration RUE forearm.  Erythema/induation noted.  Discussed with Pharm D. Will give SQ injection hyalurindase around site   9. SDOH needs - He has been referred to HF Paramedicine program.  Consult cardiac rehab.  - Medication concerns reviewed with patient and pharmacy team. Barriers identified: Yes med complicance. Will need referral to HF Paramedicine. Lives in [place]. Of note he cancelled his last 2 appointments in HF clinic back in March.  HF D/C meds  Amiodarone 200 mg twice a day  Torsemide 40 mg twice a day  Spironolactone 25 mg daily Entresto 24-26 mg twice a day   Digoxin 0.125 mg daily  HF follow has been set up. HF Paramedicine has been set up.   Length of Stay: 8  Amy Clegg, NP  08/14/2020, 8:51 AM  Advanced Heart Failure Team Pager [phone] (M-F; 7a - 5p)  Please contact [facility] for night-coverage after hours (5p -7a ) and weekends on [email or web address]  Patient seen and examined with the above-signed Advanced Practice Provider and/or Housestaff. I personally reviewed laboratory data, imaging studies and relevant notes. I independently examined the patient and formulated the important aspects of the plan. I have edited the note to reflect any of my changes or salient points. I have personally discussed the plan with the patient and/or family.  He remains in NSR after DC-CV. Volume status looks good.  Denies SOB, orthopnea or PND.   General:  Well appearing. No resp difficulty HEENT: normal Neck: supple. no JVD. Carotids 2+ bilat; no bruits. No lymphadenopathy or thryomegaly appreciated. Cor: PMI nondisplaced. Regular rate & rhythm. No rubs, gallops or murmurs. Lungs: clear Abdomen: obese soft, nontender, nondistended. No hepatosplenomegaly. No bruits or masses. Good bowel sounds. Extremities: no cyanosis, clubbing, rash, edema Neuro: alert & orientedx3, cranial nerves  grossly intact. moves all 4 extremities w/o difficulty. Affect pleasant  Agree with d/c home today on above meds. Will arrange f/u in Clinic. Long discussion about need for med compliance. Paramedicine arranged.   Arvilla Meres, MD  11:29 AM

## 2020-08-14 NOTE — Telephone Encounter (Signed)
   Donald [name] DOB: Oct 11, 1960 MRN: [id number]   Donald [name]  For purposes of improving physical access to our facilities, [name] is pleased to partner with third parties to provide [facility] [name] patients or other authorized individuals the option of convenient, on-demand ground transportation services (the Chiropractor") through use of the technology service that enables users to request on-demand ground transportation from independent third-party providers.  By opting to use and accept these Donald [name], I, the undersigned, hereby agree on behalf of myself, and on behalf of any minor child using the Donald [name] for whom I am the parent or legal guardian, as follows:  Donald [name] provided to me are provided by independent third-party transportation providers who are not Donald [name] or employees and who are unaffiliated with Donald [name]. [name] is neither a transportation carrier nor a common or public carrier. [facility] has no control over the quality or safety of the transportation that occurs as a result of the Donald [name]. [name] cannot guarantee that any third-party transportation provider will complete any arranged transportation service. [facility] makes no representation, warranty, or guarantee regarding the reliability, timeliness, quality, safety, suitability, or availability of any of the Transport Services or that they will be error free. I fully understand that traveling by vehicle involves risks and dangers of serious bodily injury, including permanent disability, paralysis, and death. I agree, on behalf of myself and on behalf of any minor child using the Transport Services for whom I am the parent or legal guardian, that the entire risk arising out of my use of the Donald [name] remains solely with me, to the maximum extent permitted under applicable law. The Donald [name] are provided "as  is" and "as available." Donald [name] disclaims all representations and warranties, express, implied or statutory, not expressly set out in these terms, including the implied warranties of merchantability and fitness for a particular purpose. I hereby waive and release Donald [name], its agents, employees, officers, directors, representatives, insurers, attorneys, assigns, successors, subsidiaries, and affiliates from any and all past, present, or future claims, demands, liabilities, actions, causes of action, or suits of any kind directly or indirectly arising from acceptance and use of the Donald [name]. I further waive and release Donald [name] and its affiliates from all present and future [name] and responsibility for any injury or death to persons or damages to property caused by or related to the use of the Donald [name]. I have read this Waiver and Release of [name], and I understand the terms used in it and their legal significance. This Waiver is freely and voluntarily given with the understanding that my right (as well as the right of any minor child for whom I am the parent or legal guardian using the Donald [name]) to legal recourse against  in connection with the Donald [name] is knowingly surrendered in return for use of these services.   I attest that I read the consent document to Donald [name], gave Donald [name] the opportunity to ask questions and answered the questions asked (if any). I affirm that Donald [name] then provided consent for he's participation in this program.     Donald [name]

## 2020-08-15 ENCOUNTER — Other Ambulatory Visit (HOSPITAL_COMMUNITY): Payer: Self-pay

## 2020-08-15 DIAGNOSIS — J449 Chronic obstructive pulmonary disease, unspecified: Secondary | ICD-10-CM | POA: Diagnosis not present

## 2020-08-15 DIAGNOSIS — I2609 Other pulmonary embolism with acute cor pulmonale: Secondary | ICD-10-CM | POA: Diagnosis not present

## 2020-08-15 DIAGNOSIS — I10 Essential (primary) hypertension: Secondary | ICD-10-CM | POA: Diagnosis not present

## 2020-08-15 DIAGNOSIS — I4891 Unspecified atrial fibrillation: Secondary | ICD-10-CM | POA: Diagnosis not present

## 2020-08-15 LAB — BASIC METABOLIC PANEL
Anion gap: 15 (ref 5–15)
BUN: 20 mg/dL (ref 6–20)
CO2: 27 mmol/L (ref 22–32)
Calcium: 9.3 mg/dL (ref 8.9–10.3)
Chloride: 88 mmol/L — ABNORMAL LOW (ref 98–111)
Creatinine, Ser: 1.13 mg/dL (ref 0.61–1.24)
GFR, Estimated: >60 mL/min (ref >60)
Glucose, Bld: 142 mg/dL — ABNORMAL HIGH (ref 70–99)
Potassium: 4.4 mmol/L (ref 3.5–5.1)
Sodium: 130 mmol/L — ABNORMAL LOW (ref 135–145)

## 2020-08-15 LAB — APTT: aPTT: 42 seconds — ABNORMAL HIGH (ref 24–36)

## 2020-08-15 LAB — CBC
HCT: 46.9 % (ref 39.0–52.0)
Hemoglobin: 15.4 g/dL (ref 13.0–17.0)
MCH: 26.7 pg (ref 26.0–34.0)
MCHC: 32.8 g/dL (ref 30.0–36.0)
MCV: 81.4 fL (ref 80.0–100.0)
Platelets: 413 10*3/uL — ABNORMAL HIGH (ref 150–400)
RBC: 5.76 MIL/uL (ref 4.22–5.81)
RDW: 15.9 % — ABNORMAL HIGH (ref 11.5–15.5)
WBC: 8.5 10*3/uL (ref 4.0–10.5)
nRBC: 0 % (ref 0.0–0.2)

## 2020-08-15 LAB — GLUCOSE, CAPILLARY
Glucose-Capillary: 169 mg/dL — ABNORMAL HIGH (ref 70–99)
Glucose-Capillary: 126 mg/dL — ABNORMAL HIGH (ref 70–99)

## 2020-08-15 MED ORDER — ATORVASTATIN CALCIUM 20 MG PO TABS
20.0000 mg | ORAL_TABLET | Freq: Every day | ORAL | 0 refills | Status: DC
  Filled 2020-08-15: qty 30, 30d supply, fill #0

## 2020-08-15 MED ORDER — AMIODARONE HCL 200 MG PO TABS
200.0000 mg | ORAL_TABLET | Freq: Two times a day (BID) | ORAL | 0 refills | Status: DC
  Filled 2020-08-15: qty 60, 30d supply, fill #0

## 2020-08-15 MED ORDER — BLOOD GLUCOSE MONITOR SYSTEM W/DEVICE KIT
PACK | 0 refills | Status: DC
  Filled 2020-08-15: qty 1, 30d supply, fill #0

## 2020-08-15 MED ORDER — ACCU-CHEK SOFTCLIX LANCETS MISC
0 refills | Status: AC
  Filled 2020-08-15: qty 100, 25d supply, fill #0

## 2020-08-15 MED ORDER — TORSEMIDE 20 MG PO TABS
40.0000 mg | ORAL_TABLET | Freq: Two times a day (BID) | ORAL | 0 refills | Status: DC
  Filled 2020-08-15: qty 120, 30d supply, fill #0

## 2020-08-15 MED ORDER — GLUCOSE BLOOD VI STRP
ORAL_STRIP | 0 refills | Status: DC
  Filled 2020-08-15: qty 100, 25d supply, fill #0

## 2020-08-15 MED ORDER — SACUBITRIL-VALSARTAN 24-26 MG PO TABS
1.0000 | ORAL_TABLET | Freq: Two times a day (BID) | ORAL | 0 refills | Status: DC
  Filled 2020-08-15: qty 60, 30d supply, fill #0

## 2020-08-15 MED ORDER — FLUTICASONE FUROATE-VILANTEROL 100-25 MCG/INH IN AEPB
1.0000 | INHALATION_SPRAY | Freq: Every day | RESPIRATORY_TRACT | 0 refills | Status: AC
  Filled 2020-08-15: qty 60, 60d supply, fill #0

## 2020-08-15 MED ORDER — BUDESONIDE-FORMOTEROL FUMARATE 80-4.5 MCG/ACT IN AERO
2.0000 | INHALATION_SPRAY | Freq: Two times a day (BID) | RESPIRATORY_TRACT | 0 refills | Status: DC
  Filled 2020-08-15: qty 10.2, 30d supply, fill #0

## 2020-08-15 MED ORDER — FLUTICASONE FUROATE-VILANTEROL 100-25 MCG/INH IN AEPB
1.0000 | INHALATION_SPRAY | Freq: Every day | RESPIRATORY_TRACT | Status: DC
  Filled 2020-08-15: qty 28

## 2020-08-15 MED ORDER — APIXABAN (ELIQUIS) VTE STARTER PACK (10MG AND 5MG)
ORAL_TABLET | ORAL | 0 refills | Status: DC
  Filled 2020-08-15: qty 74, 30d supply, fill #0

## 2020-08-15 MED ORDER — SPIRONOLACTONE 25 MG PO TABS
12.5000 mg | ORAL_TABLET | Freq: Every day | ORAL | 0 refills | Status: DC
  Filled 2020-08-15: qty 15, 30d supply, fill #0

## 2020-08-15 MED ORDER — DIGOXIN 125 MCG PO TABS
0.1250 mg | ORAL_TABLET | Freq: Every day | ORAL | 0 refills | Status: DC
  Filled 2020-08-15: qty 30, 30d supply, fill #0

## 2020-08-15 NOTE — Progress Notes (Signed)
CSW spoke with the patient at bedside regarding his [employer] bill of $143 and that the HF team can help with his bill. Mr. Rinker didn't have his account number on him to pay the bill and will call the CSW once he has this information. Mr. Aumiller is set up with [facility] for his HF outpatient follow up appointment.  4:04pm - Mr. Courington called the CSW and provided his account number for his gas bill and the CSW was able to take care of the $144 bill using HF funds.  Teisha Trowbridge, MSW, LCSWA [phone] Heart Failure Social Worker

## 2020-08-15 NOTE — Progress Notes (Signed)
Physical Therapy Treatment Patient Details Name: Donald [name] MRN: [id number] DOB: [birth year] Today's Date: 08/15/2020    History of Present Illness Pt is a 59 y.o. male admitted 08/06/20 with chest discomfort from palpitations. CXR concerning for PNA, volume overload. Syncopal episode in ED witnessed by RN. Chest CT with bilateral PE. 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 PMH includes HTN, COPD, afib, CHF, CVA, HLD, PE, pre-diabetes.   PT Comments    Pt progressing well with mobility. Pt independent with transfers, ambulation and higher level balance tasks without DME; mod indep with stair training and use of rail support. Updated d/c recommendations to outpatient PT, pt in agreement. Pt has met short-term acute PT goals, reports no further questions or concerns. Will d/c acute PT.   Follow Up Recommendations  Outpatient PT     Equipment Recommendations  None recommended by PT    Recommendations for Other Services       Precautions / Restrictions Precautions Precautions: None Precaution Comments: CPAP PRN Restrictions Weight Bearing Restrictions: No    Mobility  Bed Mobility Overal bed mobility: Independent                  Transfers Overall transfer level: Independent Equipment used: None Transfers: Sit to/from [employer] transfer comment: No AD needed for ambulation in room, pt safe with no LoB  Ambulation/Gait Ambulation/Gait assistance: Independent Gait Distance (Feet): 300 Feet Assistive device: None Gait Pattern/deviations: Step-through pattern;Decreased stride length;Wide base of support Gait velocity: Decreased   General Gait Details: Steady gait, independent without DME; no overt instability or LOB   Stairs Stairs: Yes Stairs assistance: Modified independent (Device/Increase time) Stair Management: Alternating pattern;Forwards;Two rails Number of Stairs: 4 General stair comments: Mod indep ascend/descending steps with rail  support   Wheelchair Mobility    Modified Rankin (Stroke Patients Only)       Balance Overall balance assessment: No apparent balance deficits (not formally assessed)   Sitting balance-Leahy Scale: Good       Standing balance-Leahy Scale: Good               High level balance activites: Side stepping;Backward walking;Direction changes;Turns;Sudden stops;Head turns High Level Balance Comments: No overt instability or LOB with higher level balance tasks; pt also able to step and even "jump" over object without LOB            Cognition Arousal/Alertness: Awake/alert Behavior During Therapy: WFL for tasks assessed/performed Overall Cognitive Status: Within Functional Limits for tasks assessed                                        Exercises      General Comments General comments (skin integrity, edema, etc.): VSS on RA. Discussed update from initial recommendation for HHPT services - feel pt more appropriate for OP PT services. Also discussed recommendations for return to exercise at [facility]      Pertinent Vitals/Pain Pain Assessment: No/denies pain Pain Intervention(s): Monitored during session    Home Living                      Prior Function            PT Goals (current goals can now be found in the care plan section) Acute Rehab PT Goals Patient Stated Goal: go home Progress towards  PT goals: Goals met/education completed, patient discharged from PT    Frequency    Min 3X/week      PT Plan Discharge plan needs to be updated    Co-evaluation              AM-PAC PT "6 Clicks" Mobility   Outcome Measure  Help needed turning from your back to your side while in a flat bed without using bedrails?: None Help needed moving from lying on your back to sitting on the side of a flat bed without using bedrails?: None Help needed moving to and from a bed to a chair (including a wheelchair)?: None Help needed standing up  from a chair using your arms (e.g., wheelchair or bedside chair)?: None Help needed to walk in hospital room?: None Help needed climbing 3-5 steps with a railing? : None 6 Click Score: 24    End of Session   Activity Tolerance: Patient tolerated treatment well Patient left: in bed;with call bell/phone within reach Nurse Communication: Mobility status PT Visit Diagnosis: Other abnormalities of gait and mobility (R26.89);Difficulty in walking, not elsewhere classified (R26.2)     Time: 2341-4436 PT Time Calculation (min) (ACUTE ONLY): 17 min  Charges:  $Gait Training: 8-22 mins                    Mabeline Caras, PT, DPT Acute Rehabilitation Services  Pager [phone] Office [place] 08/15/2020, 12:56 PM

## 2020-08-15 NOTE — Discharge Summary (Signed)
Discharge Summary  Donald [name] KWI:[id number] DOB: December 04, 1960  PCP: Lucianne Lei, MD  Admit date: 08/06/2020 Discharge date: 08/15/2020  Time spent: 40 mins  Recommendations for Outpatient Follow-up:  Follow-up with cardiology as scheduled Follow-up with PCP as scheduled Follow-up with outpatient rehab as scheduled  Discharge Diagnoses:  Active Hospital Problems   Diagnosis Date Noted   Atrial fibrillation with RVR ([place]) 03/08/2020   CAP (community acquired pneumonia) 08/06/2020   OSA on CPAP 08/06/2020   Leukocytosis 08/06/2020   Acute pulmonary embolism ([place]) 08/06/2020   COPD (chronic obstructive pulmonary disease) ([place]) 03/08/2020   Pulmonary embolism ([place]) 03/08/2020   Essential hypertension 03/08/2020   Type 2 diabetes mellitus without complication ([place]) 24/26/8341    Resolved Hospital Problems  No resolved problems to display.    Discharge Condition: Stable  Diet recommendation: Heart healthy, moderate carb  Vitals:   08/15/20 0725 08/15/20 1157  BP: 112/87 (!) 123/101  Pulse: 81 85  Resp: 19 19  Temp: 97.7 F (36.5 C) 97.8 F (36.6 C)  SpO2: 95% 95%    History of present illness:  60 year old male past medical history for hypertension, COPD, atrial fibrillation, systolic heart failure, pulmonary embolism, CVA, dyslipidemia who presented with chest pain and palpitations.  He had atrial flutter ablation 2019 and cardioversion in 02/22.  He reported 24 hours of severe palpitations, associated with chest pain and dyspnea.  On his initial physical examination his blood pressure was 97/67, heart rate 118, respiratory rate of 29, oxygen saturation 91%, positive increased work of breathing, soft abdomen, positive lower extremity edema bilaterally. Labs with BNP 442. Chest radiograph with cardiomegaly, hilar vascular congestion, right base atelectasis. Chest CT with bilateral pulm embolism involving lobar pulmonary arteries and distal on the right and segmental  pulmonary arteries on the left.  Positive for acute right heart strain. Right middle lobe groundglass opacity, likely pulmonary hemorrhage/ infarct. Patient was placed on anticoagulation and admitted to the intensive care unit. Remained hemodynamically stable, transfer to [facility] on 07/14.    Today, patient denies any new complaints, reports feeling much better.  Denies any worsening shortness of breath, chest pain, abdominal pain, nausea/vomiting, fever/chills.  Bilateral forearm infiltration of amiodarone has significantly improved.    Hospital Course:  Principal Problem:   Atrial fibrillation with RVR (HCC) Active Problems:   Type 2 diabetes mellitus without complication (HCC)   Pulmonary embolism (HCC)   Essential hypertension   COPD (chronic obstructive pulmonary disease) (HCC)   CAP (community acquired pneumonia)   OSA on CPAP   Leukocytosis   Acute pulmonary embolism (HCC)  Acute decompensation of chronic systolic heart failure in the setting of acute/recurrent bilateral PE, with pulmonary hypertension. Echocardiogram with LV Ef 30 to 35%, moderate decrease LV systolic function. 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  We will be starting all over, treating as a new PE)   Paroxysmal A. 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 On bronchodilator therapy   T2DM Last A1c 7.2 Discussed with heart failure pharmacist, patient may be eligible for Farxiga, but will follow-up as an outpatient considering starting it Glucometer prescribed for patient for close monitoring   OSA Continue CPAP   Obesity Lifestyle modification advised     Estimated body mass index is 35.53 kg/m as calculated from the following:   Height as of this encounter: 6' (1.829 m).   Weight as of this encounter: 118.8 kg.    Procedures: Cardiac catheterization DC cardioversion  Consultations: Cardiology/heart failure  Discharge Exam: BP (!) 123/101 (BP Location: Right Wrist)   Pulse 85   Temp 97.8 F (36.6 C) (Oral)   Resp 19   Ht 6' (1.829 m)   Wt 118.8 kg   SpO2 95%   BMI 35.53 kg/m   General: NAD  Cardiovascular: S1, S2 present Respiratory: CTAB Abdomen: Soft, nontender, nondistended, bowel sounds present Musculoskeletal: Trace bilateral pedal edema noted Skin: Noted improved erythema around bilateral forearm insertion site Psychiatry: Normal mood    Discharge Instructions You were cared for by a hospitalist during your hospital stay. If you have any questions about your discharge medications or the care you received while you were in the hospital after you are discharged, you can call the unit and asked to speak with the hospitalist on call if the hospitalist that took care of you is not available. Once you are discharged, your primary care physician will handle any further medical issues. Please note that NO REFILLS for any discharge medications will be authorized once you are discharged, as it is imperative that you return to your primary care physician (or establish a relationship with a primary care physician if you do not have one) for your aftercare needs so that  they can reassess your need for medications and monitor your lab values.  Discharge Instructions     Ambulatory referral to Nutrition and Diabetic Education   Complete by: As directed    Recently newly dx with DM; A1C 7.2% on 08/06/20   Diet - low sodium heart healthy   Complete by: As directed    Increase activity slowly   Complete by: As directed       Allergies as of 08/15/2020       Reactions   Lisinopril Cough        Medication List     STOP taking these medications    apixaban 5 MG Tabs tablet Commonly known as: ELIQUIS Replaced by: Apixaban Starter Pack (42m and 557m   aspirin 81 MG chewable tablet   carvedilol 12.5 MG tablet Commonly known as: COREG   Eliquis 5 MG Tabs tablet Generic drug: apixaban   furosemide 40 MG tablet Commonly known as: LASIX       TAKE these medications    Accu-Chek Softclix Lancets lancets Use as directed   albuterol (2.5 MG/3ML) 0.083% nebulizer solution Commonly known as: PROVENTIL Take 3 mLs (2.5 mg total) by nebulization every 4 (four) hours as needed for wheezing or shortness of breath.   albuterol 108 (90 Base) MCG/ACT inhaler Commonly known as: VENTOLIN HFA Inhale 2 puffs into the lungs every 4 (four) hours as needed for shortness of breath.   amiodarone 200 MG tablet Commonly known as: PACERONE Take 1 tablet (200 mg total) by mouth 2 (two) times daily. What changed: when to  take this   Apixaban Starter Pack (12m and 556m Commonly known as: ELIQUIS STARTER PACK Take as directed on package: start with two-47m36mablets twice daily for 7 days. On day 8, switch to one-47mg17mblet twice daily. Replaces: apixaban 5 MG Tabs tablet   atorvastatin 20 MG tablet Commonly known as: LIPITOR Take 20 mg by mouth daily.   Blood Glucose Monitor System w/Device Kit USE AS DIRECTED   Breo Ellipta 100-25 MCG/INH Aepb Generic drug: fluticasone furoate-vilanterol Inhale 1 puff into the lungs daily.   budesonide-formoterol  80-4.5 MCG/ACT inhaler Commonly known as: SYMBICORT Inhale 2 puffs into the lungs in the morning and at bedtime.   digoxin 0.125 MG tablet Commonly known as: LANOXIN Take 1 tablet (0.125 mg total) by mouth daily. Start taking on: August 16, 2020   Dulera 100-5 MCG/ACT Aero Generic drug: mometasone-formoterol Inhale 1 puff into the lungs daily.   glucose blood test strip Use as directed   sacubitril-valsartan 24-26 MG Commonly known as: ENTRESTO Take 1 tablet by mouth 2 (two) times daily. What changed: Another medication with the same name was removed. Continue taking this medication, and follow the directions you see here.   spironolactone 25 MG tablet Commonly known as: ALDACTONE Take 0.5 tablets (12.5 mg total) by mouth daily. Start taking on: August 16, 2020 What changed: how much to take   torsemide 20 MG tablet Commonly known as: DEMADEX Take 2 tablets (40 mg total) by mouth 2 (two) times daily.        Allergies  Allergen Reactions   Lisinopril Cough    Follow-up Information     [facility] Follow up on 08/22/2020.   Specialty: Cardiology Why: at 3:30 Contact information: [id number][phone]    [facility]. Go on 08/30/2020.   Specialty: Family Medicine Why: _0 :30pm Contact information: [address] [place] [phone]     [facility] Follow up.   Specialty: Rehabilitation Why: please call them to set up your apt time. Contact information: [id number][phone]             The results of significant diagnostics from this hospitalization (including imaging, microbiology, ancillary and laboratory) are listed below for reference.    Significant Diagnostic Studies: DG Chest 2 View  Result Date:  08/06/2020 CLINICAL DATA:  Shortness of breath, chest pain EXAM: CHEST - 2 VIEW COMPARISON:  03/07/2020 FINDINGS: Cardiomegaly, vascular congestion. Small right pleural effusion. Right lower lobe airspace disease again noted, similar to prior study. No confluent opacity on the left. No acute bony abnormality. IMPRESSION: Cardiomegaly, vascular congestion. Continued small right pleural effusion with right lower lobe airspace disease concerning for recurrent pneumonia. Electronically Signed   By: KeviRolm Baptise.   On: 08/06/2020 08:11   CT Angio Chest PE W/Cm &/Or Wo Cm  Addendum Date: 08/06/2020   ADDENDUM REPORT: 08/06/2020 15:55 ADDENDUM: The original report was by Dr. WaltVan Clinese following addendum is by Dr. WaltVan Clinesitical Value/emergent results were called by telephone at the time of interpretation on 08/06/2020 at 3:53 pm to provider Dr. AlliOrma Flamingo verbally acknowledged these results. Electronically Signed   By: WaltVan Clines.   On: 08/06/2020 15:55   Result Date: 08/06/2020 CLINICAL DATA:  Chest pain and shortness  of breath EXAM: CT ANGIOGRAPHY CHEST WITH CONTRAST TECHNIQUE: Multidetector CT imaging of the chest was performed using the standard protocol during bolus administration of intravenous contrast. Multiplanar CT image reconstructions and MIPs were obtained to evaluate the vascular anatomy. CONTRAST:  75m OMNIPAQUE IOHEXOL 350 MG/ML SOLN COMPARISON:  03/08/2020 FINDINGS: Cardiovascular: Bilateral acute pulmonary embolus with involvement of right upper and lower lobe pulmonary arterial branches, right middle lobe pulmonary artery, left upper lobe, and to a lesser extent left lower lobe branches. On the left side the clot is primarily segmental where as on the right side there is involvement at the lobar levels. Clot burden is moderate to high especially in the right lower lobe. There is also potentially some peripheral thrombus in the right lower lobe which  may indicate a chronic component. Mild cardiomegaly is present. Right ventricular left ventricular ratio is 1.0. Reflux of contrast in the hepatic veins. Coronary, aortic arch, and branch vessel atherosclerotic vascular disease. Mediastinum/Nodes: Scattered mostly small mediastinal lymph nodes are noted although there is a right lower paratracheal node measuring 1.5 cm in short axis on image 62 series 5, previously the same. Borderline enlarged right infrahilar adenopathy. Additional scattered smaller mediastinal lymph nodes. Lungs/Pleura: Small to moderate right pleural effusion with passive atelectasis. Hazy ground-glass opacity and consolidative opacity in the right lower lobe roughly similar to prior. Right middle lobe airspace opacity posteriorly could represent pulmonary hemorrhage and is new compared to prior. Upper Abdomen: Unremarkable Musculoskeletal: Degenerative glenohumeral arthropathy bilaterally. Old left third and fourth rib deformities from old healed fractures. Thoracic spondylosis. Review of the MIP images confirms the above findings. IMPRESSION: 1. Bilateral pulmonary embolus involving lobar pulmonary arteries and distal on the right, and segmental pulmonary arteries on the left. Positive for acute PE with CT evidence of right heart strain (RV/LV Ratio = 1.0) consistent with at least submassive (intermediate risk) PE. The presence of right heart strain has been associated with an increased risk of morbidity and mortality. Please refer to the "PE Focused" order set in EPIC. 2. Stable scattered mediastinal lymph nodes, some of these are enlarged including a right lower paratracheal lymph node measuring 1.5 cm in diameter. This could be inflammatory although low-grade malignancy cannot be readily excluded. 3. Small to moderate right pleural effusion with passive atelectasis. Similar ground-glass and airspace opacities in the right lower lobe. New hazy airspace opacity posteriorly in the right middle  lobe probably from pulmonary hemorrhage, less likely pneumonia. 4. Aortic Atherosclerosis (ICD10-I70.0). Coronary atherosclerosis with mild cardiomegaly. Radiology assistant personnel have been notified to put me in telephone contact with the referring physician or the referring physician's clinical representative in order to discuss these findings. Once this communication is established I will issue an addendum to this report for documentation purposes. Electronically Signed: By: WVan ClinesM.D. On: 08/06/2020 15:49   CARDIAC CATHETERIZATION  Result Date: 08/12/2020 Formatting of this result is different from the original.   Mid RCA lesion is 60% stenosed.   Prox RCA lesion is 30% stenosed.   RPDA lesion is 30% stenosed.   1st Mrg lesion is 30% stenosed.   Prox LAD to Mid LAD lesion is 20% stenosed.   2nd Diag lesion is 40% stenosed.   The left ventricular ejection fraction is less than 25% by visual estimate. Findings: Ao = 109/81 (93) LV = 102/9 RA =  7 RV = 46/4 PA = 49/17 (32) PCW = 16 (v = 25) Fick cardiac output/index = 5.5/2.3 PVR = 2.9 Ao sat =  97% PA sat = 67%,68% Assessment: Mild pulmonary HTN Filling pressures improved Low-normal cardiac output Non-obstructive CAD Severe NICM EF ~25% Plan/Discussion: Continue to titrate GDMT. Proceed with TEE/DC-CV. Glori Bickers, MD 8:47 AM  ECHOCARDIOGRAM COMPLETE  Result Date: 08/07/2020    ECHOCARDIOGRAM REPORT   Patient Name:   Donald [name] Date of Exam: 08/07/2020 Medical Rec #:  [id number]       Height:       72.0 in Accession #:    [id number]      Weight:       293.9 lb Date of Birth:  Dec 04, 1960        BSA:          2.509 m Patient Age:    60 years        BP:           113/89 mmHg Patient Gender: M               HR:           105 bpm. Exam Location:  Inpatient Procedure: 2D Echo, 3D Echo, Cardiac Doppler and Color Doppler Indications:    I50.40* Unspecified combined systolic (congestive) and diastolic                 (congestive) heart failure   History:        Patient has prior history of Echocardiogram examinations, most                 recent 03/12/2020. Abnormal ECG, COPD, Arrythmias:Atrial                 Fibrillation, Signs/Symptoms:Chest Pain; Risk Factors:Diabetes                 and Sleep Apnea. Pulmonary embolus.  Sonographer:    Roseanna Rainbow RDCS Referring Phys: [id number] [place]  1. Left ventricular ejection fraction, by estimation, is 30 to 35%. The left ventricle has moderately decreased function. The left ventricle demonstrates global hypokinesis. The left ventricular internal cavity size was mildly dilated. There is mild left ventricular hypertrophy. Left ventricular diastolic parameters are indeterminate.  2. Right ventricular systolic function is moderately reduced. The right ventricular size is moderately enlarged. There is moderately elevated pulmonary artery systolic pressure. The estimated right ventricular systolic pressure is 62.9 mmHg.  3. Left atrial size was moderately dilated.  4. Right atrial size was moderately dilated.  5. The mitral valve is normal in structure. Mild mitral valve regurgitation. No evidence of mitral stenosis.  6. The aortic valve is tricuspid. Aortic valve regurgitation is trivial. Mild to moderate aortic valve sclerosis/calcification is present, without any evidence of aortic stenosis.  7. Aortic dilatation noted. There is mild dilatation of the ascending aorta, measuring 42 mm.  8. The inferior vena cava is dilated in size with <50% respiratory variability, suggesting right atrial pressure of 15 mmHg.  9. The patient is in atrial fibrillation. FINDINGS  Left Ventricle: Left ventricular ejection fraction, by estimation, is 30 to 35%. The left ventricle has moderately decreased function. The left ventricle demonstrates global hypokinesis. The left ventricular internal cavity size was mildly dilated. There is mild left ventricular hypertrophy. Left ventricular diastolic parameters are  indeterminate. Right Ventricle: The right ventricular size is moderately enlarged. No increase in right ventricular wall thickness. Right ventricular systolic function is moderately reduced. There is moderately elevated pulmonary artery systolic pressure. The tricuspid  regurgitant velocity is 2.79 m/s, and with an assumed right atrial pressure of  15 mmHg, the estimated right ventricular systolic pressure is 57.8 mmHg. Left Atrium: Left atrial size was moderately dilated. Right Atrium: Right atrial size was moderately dilated. Pericardium: Trivial pericardial effusion is present. Mitral Valve: The mitral valve is normal in structure. There is mild thickening of the mitral valve leaflet(s). Mild mitral annular calcification. Mild mitral valve regurgitation. No evidence of mitral valve stenosis. Tricuspid Valve: The tricuspid valve is normal in structure. Tricuspid valve regurgitation is mild. Aortic Valve: The aortic valve is tricuspid. Aortic valve regurgitation is trivial. Mild to moderate aortic valve sclerosis/calcification is present, without any evidence of aortic stenosis. Aortic valve peak gradient measures 27.2 mmHg. Pulmonic Valve: The pulmonic valve was normal in structure. Pulmonic valve regurgitation is not visualized. Aorta: The aortic root is normal in size and structure and aortic dilatation noted. There is mild dilatation of the ascending aorta, measuring 42 mm. Venous: The inferior vena cava is dilated in size with less than 50% respiratory variability, suggesting right atrial pressure of 15 mmHg. IAS/Shunts: No atrial level shunt detected by color flow Doppler.  LEFT VENTRICLE PLAX 2D LVIDd:         5.60 cm LVIDs:         4.50 cm LV PW:         1.40 cm LV IVS:        1.20 cm LVOT diam:     2.20 cm LV SV:         51 LV SV Index:   20 LVOT Area:     3.80 cm  LV Volumes (MOD) LV vol d, MOD A2C: 137.0 ml LV vol d, MOD A4C: 105.0 ml LV vol s, MOD A2C: 101.0 ml LV vol s, MOD A4C: 69.1 ml LV SV MOD A2C:      36.0 ml LV SV MOD A4C:     105.0 ml LV SV MOD BP:      40.6 ml RIGHT VENTRICLE            IVC RV S prime:     7.51 cm/s  IVC diam: 3.90 cm TAPSE (M-mode): 0.7 cm LEFT ATRIUM             Index       RIGHT ATRIUM           Index LA diam:        5.50 cm 2.19 cm/m  RA Area:     25.00 cm LA Vol (A2C):   75.8 ml 30.21 ml/m RA Volume:   77.20 ml  30.77 ml/m LA Vol (A4C):   90.7 ml 36.15 ml/m LA Biplane Vol: 90.2 ml 35.95 ml/m  AORTIC VALVE AV Area (Vmax): 1.37 cm AV Vmax:        261.00 cm/s AV Peak Grad:   27.2 mmHg LVOT Vmax:      93.80 cm/s LVOT Vmean:     61.300 cm/s LVOT VTI:       0.135 m  AORTA Ao Root diam: 3.50 cm Ao Asc diam:  4.20 cm MITRAL VALVE                 TRICUSPID VALVE MV Area (PHT): 4.79 cm      TR Peak grad:   31.1 mmHg MV Decel Time: 158 msec      TR Vmax:        279.00 cm/s MR Peak grad:    58.7 mmHg MR Mean grad:    32.0 mmHg   SHUNTS MR Vmax:  383.00 cm/s Systemic VTI:  0.14 m MR Vmean:        259.0 cm/s  Systemic Diam: 2.20 cm MR PISA:         0.57 cm MR PISA Eff ROA: 6 mm MR PISA Radius:  0.30 cm MV E velocity: 96.63 cm/s Loralie Champagne MD Electronically signed by Loralie Champagne MD Signature Date/Time: 08/07/2020/2:36:54 PM    Final    VAS [place] LOWER EXTREMITY VENOUS (DVT)  Result Date: 08/07/2020  Lower Venous DVT Study Patient Name:  Donald [name]  Date of Exam:   08/07/2020 Medical Rec #: [id number]        Accession #:    [id number] Date of Birth: 1960/07/16         Patient Gender: M Patient Age:   41Y Exam Location:  [facility] Procedure:      VAS [place] LOWER EXTREMITY VENOUS (DVT) Referring Phys: [id number] STEPHANIE M REESE --------------------------------------------------------------------------------  Indications: Pulmonary embolism.  Limitations: Body habitus, poor ultrasound/tissue interface and patient unable to tolerate compressions. Comparison Study: 03-08-2020 Prior bilateral lower extremity venous was negative                   for DVT.                     08-06-2020 CTA Chest positive for PE. Performing Technologist: Rogelia Rohrer RVT, RDMS  Examination Guidelines: A complete evaluation includes B-mode imaging, spectral Doppler, color Doppler, and power Doppler as needed of all accessible portions of each vessel. Bilateral testing is considered an integral part of a complete examination. Limited examinations for reoccurring indications may be performed as noted. The reflux portion of the exam is performed with the patient in reverse Trendelenburg.  +---------+---------------+---------+-----------+----------+--------------+ RIGHT    CompressibilityPhasicitySpontaneityPropertiesThrombus Aging +---------+---------------+---------+-----------+----------+--------------+ CFV      Full           No       Yes                                 +---------+---------------+---------+-----------+----------+--------------+ SFJ      Full                                                        +---------+---------------+---------+-----------+----------+--------------+ FV Prox  Full           No       Yes                                 +---------+---------------+---------+-----------+----------+--------------+ FV Mid   Full           No       Yes                                 +---------+---------------+---------+-----------+----------+--------------+ FV DistalFull           No       Yes                                 +---------+---------------+---------+-----------+----------+--------------+ PFV  Full                                                        +---------+---------------+---------+-----------+----------+--------------+ POP      Full           No       Yes                                 +---------+---------------+---------+-----------+----------+--------------+ PTV      Full                                                        +---------+---------------+---------+-----------+----------+--------------+ PERO      Full                                                        +---------+---------------+---------+-----------+----------+--------------+   +---------+---------------+---------+-----------+-------------+--------------+ LEFT     CompressibilityPhasicitySpontaneityProperties   Thrombus Aging +---------+---------------+---------+-----------+-------------+--------------+ CFV      Full           No       Yes        Rouleaux flow               +---------+---------------+---------+-----------+-------------+--------------+ SFJ      Full                                                           +---------+---------------+---------+-----------+-------------+--------------+ FV Prox  Full           No       Yes                                    +---------+---------------+---------+-----------+-------------+--------------+ FV Mid   Full           No       Yes                                    +---------+---------------+---------+-----------+-------------+--------------+ FV DistalFull           No       Yes                                    +---------+---------------+---------+-----------+-------------+--------------+ PFV      Full                                                           +---------+---------------+---------+-----------+-------------+--------------+  POP      Full           No       Yes                                    +---------+---------------+---------+-----------+-------------+--------------+ PTV      Full                                                           +---------+---------------+---------+-----------+-------------+--------------+ PERO                                                     Not visualized +---------+---------------+---------+-----------+-------------+--------------+     Summary: RIGHT: - There is no evidence of deep vein thrombosis in the lower extremity. - There is no evidence of superficial venous  thrombosis.  - A cystic structure is found in the popliteal fossa.  - Pulsatile waveforms observed throughout the lower extremity, consistent with elevated right heart pressure.  LEFT: - There is no evidence of deep vein thrombosis in the lower extremity. However, portions of this examination were limited- see technologist comments above. - There is no evidence of superficial venous thrombosis.  - No cystic structure found in the popliteal fossa.  - Pulsatile waveforms observed throughout the lower extremity, consistent with elevated right heart pressure.  *See table(s) above for measurements and observations. Electronically signed by Jamelle Haring on 08/07/2020 at 7:22:02 PM.    Final     Microbiology: Recent Results (from the past 240 hour(s))  Resp Panel by RT-PCR (Flu A&B, Covid) Nasopharyngeal Swab     Status: None   Collection Time: 08/06/20 11:55 AM   Specimen: Nasopharyngeal Swab; Nasopharyngeal(NP) swabs in vial transport medium  Result Value Ref Range Status   SARS Coronavirus 2 by RT PCR NEGATIVE NEGATIVE Final    Comment: (NOTE) SARS-CoV-2 target nucleic acids are NOT DETECTED.  The SARS-CoV-2 RNA is generally detectable in upper respiratory specimens during the acute phase of infection. The lowest concentration of SARS-CoV-2 viral copies this assay can detect is 138 copies/mL. A negative result does not preclude SARS-Cov-2 infection and should not be used as the sole basis for treatment or other patient management decisions. A negative result may occur with  improper specimen collection/handling, submission of specimen other than nasopharyngeal swab, presence of viral mutation(s) within the areas targeted by this assay, and inadequate number of viral copies(<138 copies/mL). A negative result must be combined with clinical observations, patient history, and epidemiological information. The expected result is Negative.  Fact Sheet for Patients:   EntrepreneurPulse.com.au  Fact Sheet for Healthcare Providers:  IncredibleEmployment.be  This test is no t yet approved or cleared by the [place] FDA and  has been authorized for detection and/or diagnosis of SARS-CoV-2 by FDA under an Emergency Use Authorization (EUA). This EUA will remain  in effect (meaning this test can be used) for the duration of the COVID-19 declaration under Section 564(b)(1) of the Act, 21 U.S.C.section 360bbb-3(b)(1), unless the authorization is terminated  or revoked sooner.       Influenza A  by PCR NEGATIVE NEGATIVE Final   Influenza B by PCR NEGATIVE NEGATIVE Final    Comment: (NOTE) The Xpert Xpress SARS-CoV-2/FLU/RSV plus assay is intended as an aid in the diagnosis of influenza from Nasopharyngeal swab specimens and should not be used as a sole basis for treatment. Nasal washings and aspirates are unacceptable for Xpert Xpress SARS-CoV-2/FLU/RSV testing.  Fact Sheet for Patients: EntrepreneurPulse.com.au  Fact Sheet for Healthcare Providers: IncredibleEmployment.be  This test is not yet approved or cleared by the [place] FDA and has been authorized for detection and/or diagnosis of SARS-CoV-2 by FDA under an Emergency Use Authorization (EUA). This EUA will remain in effect (meaning this test can be used) for the duration of the COVID-19 declaration under Section 564(b)(1) of the Act, 21 U.S.C. section 360bbb-3(b)(1), unless the authorization is terminated or revoked.  Performed at [facility], [place] [address] [place], [place] [address]   Culture, blood (Routine X 2) w Reflex to ID Panel     Status: None   Collection Time: 08/06/20 11:58 AM   Specimen: BLOOD  Result Value Ref Range Status   Specimen Description BLOOD RIGHT ANTECUBITAL  Final   Special Requests   Final    BOTTLES DRAWN AEROBIC AND ANAEROBIC Blood Culture adequate volume   Culture    Final    NO GROWTH 5 DAYS Performed at [facility], [place] [address] [place], [place] [address]    Report Status 08/11/2020 FINAL  Final  Culture, blood (Routine X 2) w Reflex to ID Panel     Status: None   Collection Time: 08/06/20 11:58 AM   Specimen: BLOOD  Result Value Ref Range Status   Specimen Description BLOOD LEFT ANTECUBITAL  Final   Special Requests   Final    BOTTLES DRAWN AEROBIC AND ANAEROBIC Blood Culture adequate volume   Culture   Final    NO GROWTH 5 DAYS Performed at [facility], [place] [address] [place], [place] [address]    Report Status 08/11/2020 FINAL  Final  MRSA Next Gen by PCR, Nasal     Status: None   Collection Time: 08/13/20 12:40 PM   Specimen: Nasal Mucosa; Nasal Swab  Result Value Ref Range Status   MRSA by PCR Next Gen NOT DETECTED NOT DETECTED Final    Comment: (NOTE) The GeneXpert MRSA Assay (FDA approved for NASAL specimens only), is one component of a comprehensive MRSA colonization surveillance program. It is not intended to diagnose MRSA infection nor to guide or monitor treatment for MRSA infections. Test performance is not FDA approved in patients less than 51 years old. Performed at [facility], [place] [address], [place], [place] [address]      Labs: Basic Metabolic Panel: Recent Labs  Lab 08/10/20 [phone] 08/11/20 0243 08/12/20 0326 08/12/20 0807 08/12/20 0815 08/13/20 0243 08/14/20 0305 08/15/20 0424  NA 131* 126* 131* 139 141  135 130* 131* 130*  K 3.1* 3.6 4.0 3.1* 2.9*  3.6 4.7 4.5 4.4  CL 88* 82* 88*  --   --  90* 91* 88*  CO2 31 33* 30  --   --  _0 GLUCOSE 157* 158* 163*  --   --  155* 134* 142*  BUN _1 --   --  _2 CREATININE 1.09 0.97 0.99  --   --  1.04 1.04 1.13  CALCIUM 8.6* 8.5* 8.7*  --   --  8.9  8.8* 9.3  MG 1.6* 1.9  --   --   --   --   --   --    Liver Function Tests: No results for input(s): AST, ALT, ALKPHOS, BILITOT, PROT, ALBUMIN in the last 168 hours. No  results for input(s): LIPASE, AMYLASE in the last 168 hours. No results for input(s): AMMONIA in the last 168 hours. CBC: Recent Labs  Lab 08/11/20 0243 08/12/20 0326 08/12/20 0807 08/12/20 0815 08/13/20 0243 08/14/20 0305 08/15/20 0424  WBC 7.7 8.0  --   --  9.0 8.3 8.5  HGB 13.5 14.5 13.9 13.6  15.6 14.6 15.1 15.4  HCT 41.5 45.1 41.0 40.0  46.0 45.7 47.3 46.9  MCV 82.3 82.6  --   --  82.9 82.7 81.4  PLT 288 293  --   --  345 315 413*   Cardiac Enzymes: No results for input(s): CKTOTAL, CKMB, CKMBINDEX, TROPONINI in the last 168 hours. BNP: BNP (last 3 results) Recent Labs    03/18/20 1633 08/06/20 0754 08/07/20 0423  BNP 254.6* 442.4* 650.8*    ProBNP (last 3 results) No results for input(s): PROBNP in the last 8760 hours.  CBG: Recent Labs  Lab 08/14/20 1059 08/14/20 1640 08/14/20 2102 08/15/20 0544 08/15/20 1153  GLUCAP 129* 164* 135* 169* 126*       Signed:  Alma Friendly, MD [facility] 08/15/2020, 12:13 PM

## 2020-08-15 NOTE — Anesthesia Postprocedure Evaluation (Signed)
Anesthesia Post Note  Patient: Donald [name]  Procedure(s) Performed: TRANSESOPHAGEAL ECHOCARDIOGRAM (TEE) CARDIOVERSION     Patient location during evaluation: PACU Anesthesia Type: General Level of consciousness: awake and alert Pain management: pain level controlled Vital Signs Assessment: post-procedure vital signs reviewed and stable Respiratory status: spontaneous breathing, nonlabored ventilation, respiratory function stable and patient connected to nasal cannula oxygen Cardiovascular status: blood pressure returned to baseline and stable Postop Assessment: no apparent nausea or vomiting Anesthetic complications: no   No notable events documented.                Shelton Silvas

## 2020-08-15 NOTE — TOC Transition Note (Addendum)
Transition of Care [facility]) - CM/SW Discharge Note   Patient Details  Name: Donald [name] MRN: [id number] Date of Birth: January 01, 1961  Transition [facility]) CM/SW Contact:  Leone Haven, RN Phone Number: 08/15/2020, 11:59 AM   Clinical Narrative:    Patient is for dc today, TOC to fill his meds here for him prior to dc.   He will be going to Outpatient physical therapy for rehab on [facility].  NCM made referral in Epic and it is listed on AVS.  He will received transportation from [facility] transport for MD apts per HF CSW. Patient is set up with [facility] who will be helping him with his medications at home.   Final next level of care: Home/Self Care Barriers to Discharge: No Barriers Identified   Patient Goals and CMS Choice Patient states their goals for this hospitalization and ongoing recovery are:: return home   Choice offered to / list presented to : NA  Discharge Placement                       Discharge Plan and Services In-house Referral: Clinical Social Work                DME Agency: NA       HH Arranged: NA          Social Determinants of Health (SDOH) Interventions Food Insecurity Interventions: Other (Comment) (Pt. reports recieving Food Stamps about $250 a month) Financial Strain Interventions: Other (Comment) (Referral to HF outpatient clinic and medication assistance) Housing Interventions: Intervention Not Indicated Transportation Interventions: [facility]   Readmission Risk Interventions No flowsheet data found.

## 2020-08-15 NOTE — Progress Notes (Signed)
Occupational Therapy Treatment and Discharge Patient Details Name: Donald [name] MRN: [id number] DOB: December 30, 1960 Today's Date: 08/15/2020    History of present illness 60 y.o. male presenting on 08/06/2020 for chest discomfort from palpitations. CXR concerning for pneumonia and possible volume overload. Had syncopal episode in ER witnessed by nurse. Chest CT with bilateral pulm embolism. 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  PMH: HTN, COPD, atrial fibrillation with cardioversion on 07/02/98, systolic CHF,  prediabetes, PE in 02/2020, hx of CVA in 2018, HLD, hx of right sided atrial flutter ablation in 2019.   OT comments  Pt completed all OT goals this session. Per RN pt bathed independently in shower. OT then observed pt complete LB dressing with no assist, as well as transfers and ambulation in room using no AD, safely. Pt no longer needs OT services and Acute OT will sign off. Please re-consult if needs change.   Follow Up Recommendations  No OT follow up;Supervision - Intermittent    Equipment Recommendations  None recommended by OT    Recommendations for Other Services      Precautions / Restrictions Precautions Precautions: Fall Precaution Comments: CPAP PRN Restrictions Weight Bearing Restrictions: No       Mobility Bed Mobility Overal bed mobility: Modified Independent                  Transfers Overall transfer level: Modified independent               General transfer comment: No AD needed for ambulation in room, pt safe with no LoB    Balance Overall balance assessment: No apparent balance deficits (not formally assessed)                                         ADL either performed or assessed with clinical judgement   ADL Overall ADL's : Modified independent;At baseline                                       General ADL Comments: Pt completed bathing prior to OT session, RN reported that he bathed in  the shower independently. Pt then completed dressing with OT, no assist needed, safe, sitting EOB and standing to finish donning shorts and underwear. All transfers were safe with no AD.     Vision   Vision Assessment?: No apparent visual deficits   Perception     Praxis      Cognition Arousal/Alertness: Awake/alert Behavior During Therapy: WFL for tasks assessed/performed Overall Cognitive Status: Within Functional Limits for tasks assessed                                          Exercises     Shoulder Instructions       General Comments VSS on RA    Pertinent Vitals/ Pain       Pain Assessment: No/denies pain  Home Living                                          Prior Functioning/Environment  Frequency  Min 2X/week        Progress Toward Goals  OT Goals(current goals can now be found in the care plan section)  Progress towards OT goals: Goals met/education completed, patient discharged from OT  Acute Rehab OT Goals Patient Stated Goal: go home OT Goal Formulation: All assessment and education complete, DC therapy Time For Goal Achievement: 08/15/20 Potential to Achieve Goals: Good ADL Goals Pt Will Perform Lower Body Bathing: with modified independence Pt Will Perform Lower Body Dressing: with modified independence Pt Will Perform Toileting - Clothing Manipulation and hygiene: with modified independence  Plan All goals met and education completed, patient discharged from OT services    Co-evaluation                 AM-PAC OT "6 Clicks" Daily Activity     Outcome Measure   Help from another person eating meals?: None Help from another person taking care of personal grooming?: None Help from another person toileting, which includes using toliet, bedpan, or urinal?: None Help from another person bathing (including washing, rinsing, drying)?: None Help from another person to put on and taking  off regular upper body clothing?: None Help from another person to put on and taking off regular lower body clothing?: None 6 Click Score: 24    End of Session Equipment Utilized During Treatment: Rolling walker  OT Visit Diagnosis: Unsteadiness on feet (R26.81);Muscle weakness (generalized) (M62.81)   Activity Tolerance Patient tolerated treatment well   Patient Left in bed;with call bell/phone within reach   Nurse Communication Mobility status        Time: 9794-9971 OT Time Calculation (min): 10 min  Charges: OT General Charges $OT Visit: 1 Visit OT Treatments $Self Care/Home Management : 8-22 mins  [name], OTR/L Acute Rehabilitation  [name] [name] 08/15/2020, 10:52 AM

## 2020-08-15 NOTE — Consult Note (Signed)
   [facility] Inpatient Consult   08/15/2020  NAJEEB UPTAIN 02-15-1960 [id number]  Managed Medicaid Patient: [employer]   Patient screened for hospitalization of 9 days with noted.Assessed for post hospital transition of care services needed for chronic care management.  Review of patient's medical record reveals patient is being recommended for OP rehab. Spoke with the patient via hospital phone and HIPAA verified.  Explained that he could qualify for Managed Medicaid chronic care management team.  Patient endorses Renaye Rakers, MD as his primary care provider.  This provider office is listed to the transition of care follow up calls and appointments. Plan:  Will make referral for post hospital follow up.  For questions contact:   Charlesetta Shanks, RN BSN CCM [facility]  [phone] business mobile phone Toll free office [phone]  Fax number: [phone] [email or web address]

## 2020-08-21 ENCOUNTER — Other Ambulatory Visit (HOSPITAL_BASED_OUTPATIENT_CLINIC_OR_DEPARTMENT_OTHER): Payer: Self-pay

## 2020-08-21 ENCOUNTER — Telehealth (HOSPITAL_BASED_OUTPATIENT_CLINIC_OR_DEPARTMENT_OTHER): Payer: Self-pay | Admitting: Pharmacist

## 2020-08-21 NOTE — Telephone Encounter (Signed)
Pharmacy Transitions of Care Follow-up Telephone Call  Date of discharge: 08/15/2020  Discharge Diagnosis: Afib with RVR   Telephone Assessment: Patient is doing well today. Feeling good and recovering well. No signs and symptoms of bleeding. Weight has been stable. No fluid retention noticeable. No lower extremity edema. Breathing well, able to lay down flat. Reports he is taking all medications as prescribed. He stated he had a doctors appointment yesterday, in which he read to me that his BP was 106/78 mmHg with P of 83 bpm. He is using a pill pack service for prescriptions. Patient aware of appointment tomorrow with [facility], has transportation set up.   Medication changes made at discharge:  - START:  Digoxin 0.125 mg once daily  Eliquis Starter Pack (previous on this at home, however, was non-compliant)  Torsemide 40 mg twice daily    - CHANGED:  Amiodarone 200 mg switched from once daily to twice daily  Spironolactone 25 mg switched from 1 tablet daily to 1/2 tablet (12.5 mg) daily   - STOPPED: Carvedilol, aspirin 81 mg, furosemide   Medication changes verified by the patient? YES     Medication Accessibility:  Home Pharmacy: Unsure - states he gets from a pill pack service    Was the patient provided with refills on discharged medications? No   Is the patient able to afford medications? Yes - patient is insured    Medication Review: APIXABAN (ELIQUIS)  Initiated on [employer] Pack - treating as new to therapy due to previous non-adherence - Discussed importance of taking medication around the same time everyday  - Reviewed potential DDIs with patient  - Advised patient of medications to avoid (NSAIDs, ASA)  - Educated that Tylenol (acetaminophen) will be the preferred analgesic to prevent risk of bleeding  - Emphasized importance of monitoring for signs and symptoms of bleeding (abnormal bruising, prolonged bleeding, nose bleeds, bleeding from gums, discolored urine,  black tarry stools)  - Advised patient to alert all providers of anticoagulation therapy prior to starting a new medication or having a procedure   Follow-up Appointments: Cardiology Appointment tomorrow at [facility] at 3:30 pm: confirmed by patient.   If their condition worsens, is the pt aware to call PCP or go to the Emergency Dept.? YES  Theodis Sato, PharmD Clinical Pharmacist [facility]  08/21/2020 11:00 AM

## 2020-08-22 ENCOUNTER — Other Ambulatory Visit: Payer: Self-pay

## 2020-08-22 ENCOUNTER — Ambulatory Visit (HOSPITAL_COMMUNITY)
Admit: 2020-08-22 | Discharge: 2020-08-22 | Disposition: A | Discharge status: Home / Self Care | Payer: Medicaid Other | Source: Ambulatory Visit | Attending: Adult Health | Admitting: Adult Health

## 2020-08-22 ENCOUNTER — Encounter (HOSPITAL_COMMUNITY): Payer: Self-pay

## 2020-08-22 VITALS — BP 124/86 | HR 105 | Ht 72.0 in | Wt 265.0 lb

## 2020-08-22 DIAGNOSIS — I2510 Atherosclerotic heart disease of native coronary artery without angina pectoris: Secondary | ICD-10-CM | POA: Diagnosis not present

## 2020-08-22 DIAGNOSIS — Z8673 Personal history of transient ischemic attack (TIA), and cerebral infarction without residual deficits: Secondary | ICD-10-CM | POA: Diagnosis not present

## 2020-08-22 DIAGNOSIS — Z86711 Personal history of pulmonary embolism: Secondary | ICD-10-CM | POA: Diagnosis not present

## 2020-08-22 DIAGNOSIS — I480 Paroxysmal atrial fibrillation: Secondary | ICD-10-CM | POA: Diagnosis not present

## 2020-08-22 DIAGNOSIS — G4733 Obstructive sleep apnea (adult) (pediatric): Secondary | ICD-10-CM

## 2020-08-22 DIAGNOSIS — I5022 Chronic systolic (congestive) heart failure: Secondary | ICD-10-CM

## 2020-08-22 DIAGNOSIS — I428 Other cardiomyopathies: Secondary | ICD-10-CM | POA: Diagnosis not present

## 2020-08-22 DIAGNOSIS — Z79899 Other long term (current) drug therapy: Secondary | ICD-10-CM | POA: Insufficient documentation

## 2020-08-22 DIAGNOSIS — Z87891 Personal history of nicotine dependence: Secondary | ICD-10-CM | POA: Diagnosis not present

## 2020-08-22 DIAGNOSIS — Z7901 Long term (current) use of anticoagulants: Secondary | ICD-10-CM | POA: Insufficient documentation

## 2020-08-22 DIAGNOSIS — I110 Hypertensive heart disease with heart failure: Secondary | ICD-10-CM | POA: Insufficient documentation

## 2020-08-22 DIAGNOSIS — I2782 Chronic pulmonary embolism: Secondary | ICD-10-CM

## 2020-08-22 DIAGNOSIS — I4892 Unspecified atrial flutter: Secondary | ICD-10-CM | POA: Insufficient documentation

## 2020-08-22 DIAGNOSIS — Z888 Allergy status to other drugs, medicaments and biological substances status: Secondary | ICD-10-CM | POA: Diagnosis not present

## 2020-08-22 DIAGNOSIS — Z8249 Family history of ischemic heart disease and other diseases of the circulatory system: Secondary | ICD-10-CM | POA: Insufficient documentation

## 2020-08-22 LAB — BASIC METABOLIC PANEL
Anion gap: 10 (ref 5–15)
BUN: 18 mg/dL (ref 6–20)
CO2: 28 mmol/L (ref 22–32)
Calcium: 9.6 mg/dL (ref 8.9–10.3)
Chloride: 95 mmol/L — ABNORMAL LOW (ref 98–111)
Creatinine, Ser: 1.4 mg/dL — ABNORMAL HIGH (ref 0.61–1.24)
GFR, Estimated: 58 mL/min — ABNORMAL LOW (ref >60)
Glucose, Bld: 113 mg/dL — ABNORMAL HIGH (ref 70–99)
Potassium: 4 mmol/L (ref 3.5–5.1)
Sodium: 133 mmol/L — ABNORMAL LOW (ref 135–145)

## 2020-08-22 MED ORDER — SPIRONOLACTONE 25 MG PO TABS: 12.5000 mg | ORAL_TABLET | Freq: Every day | ORAL | 0 refills | Status: DC

## 2020-08-22 MED ORDER — AMIODARONE HCL 200 MG PO TABS: 400.0000 mg | ORAL_TABLET | Freq: Two times a day (BID) | ORAL | 0 refills | Status: DC

## 2020-08-22 NOTE — Progress Notes (Signed)
Primary Care: Dr Criss Rosales  Primary Cardiologist: Dr Minna Merritts  HF MD at [facility]: Dr Haroldine Laws  HPI: [name] is a 60 year old with a history of a fib, A fib ablation, CVA 2018, COPD, OSA, HTN, obesity, and chronic systolic heart failure.  Looks like he has been taking his medications intermittently.    Admitted 12/2019 at [facility] with A/C systolic heart failure and A fib RVR. Diuresed and transitioned back to pos lasix.  There was concern about medication compliance.    Followed by Dr Minna Merritts for heart failure and was last seen 01/30/20. At that time he was running out of his medications. HF meds refilled. He had also planned to obtain cardioversion.   Admitted 03/07/20 with increased shortness of breath. CTA concerning for acute bilateral PE and A fib RVR. Had successful cardioversion on 03/12/20. Discharged on lasix 40 mg daily, carvedilol 12.5 mg twice a day, eliquis, and spironolactone 25 mg daily.   Admitted with chest pain, A fib RVR, acute PE, and  A/C systolic heart failure. CT chest showed acute bilateral submassive PE w/ RV strain. He had not been taking eliquis. Anticoagulated with heparin drip followed by eliquis.   Lower extremity dopplers negative. ECHO showed EF down to 30-35% with RV moderately reduced. Diuresed with IV lasix and placed on amio drip.Once stabilized had LHC/RHC  with nonobstructive CAD, low normal cardiac output, and EF 25%.  He later had TEE with successful cardioversion. He was discharged on eliquis and given meds prior to discharge. He was referred to HF paramedicine   Today he returns for post hospital HF follow up.Overall feeling better. Denies SOB/PND/Orthopnea. Appetite ok. No fever or chills. Weight at home 263 pounds. No bleeding issues. Taking all medications and places them in a pill box. Rides a scooter or gets [facility] tranport to appointments. He does not have a license due to DUI x2. Uses food stamps. Lives alone. He is disabled.  Cardiac Testing   DC-CV 08/13/20 --200 j x1--> NSR  DC-CV 03/13/2019--->NSR   Had A flutter ablation in 2019   [facility] 08/12/2020  . Mid RCA lesion is 60% stenosed.   Prox RCA lesion is 30% stenosed.   RPDA lesion is 30% stenosed.   1st Mrg lesion is 30% stenosed.   Prox LAD to Mid LAD lesion is 20% stenosed.   2nd Diag lesion is 40% stenosed.   The left ventricular ejection fraction is less than 25% by visual estimate.  Findings:  Ao = 109/81 (93)  LV = 102/9  RA = 7  RV = 46/4  PA = 49/17 (32)  PCW = 16 (v = 25)  Fick cardiac output/index = 5.5/2.3  PVR = 2.9  Ao sat = 97%  PA sat = 67%,68%   TEE 08/13/2020 EF 20% + smoke no clot  Echo 07/2020 EF down 30-35% with RV moerately reduced ECHO 03/08/2020 EF 40-45% RV mildly reduced, RA/LA severely dilated, mild-mod MV regurgitatio  CTA 03/08/2020  Partially occlusive thrombus within segmental and subsegmental arterial branches within the bilateral lungs as described above. No evidence of right ventricular heart strain. Small right pleural effusion. Bilateral patchy ground-glass opacity seen within both lung bases right greater than left which could be due to atelectasis, or infectious etiology.  Nuclear Stress Test 2018  1. No reversible ischemia or infarction.  2. Mild generalized hypokinesis. No focal left ventricular wall  motion defect beyond mild generalized hypokinesis.  3. Left ventricular ejection fraction  43%       Past Medical History:  Diagnosis Date   Arrhythmia    A fib    Atrial fibrillation (HCC)    CHF (congestive heart failure) (HCC)    COPD (chronic obstructive pulmonary disease) (HCC)    Hypertension    Sleep apnea    Stroke [facility])     Current Outpatient Medications  Medication Sig Dispense Refill   Accu-Chek Softclix Lancets lancets Use as directed 100 each 0   albuterol (PROVENTIL HFA;VENTOLIN HFA) 108 (90 Base) MCG/ACT inhaler Inhale 2 puffs into the lungs every 4 (four) hours as needed for shortness of breath.      albuterol (PROVENTIL) (2.5 MG/3ML) 0.083% nebulizer solution Take 3 mLs (2.5 mg total) by nebulization every 4 (four) hours as needed for wheezing or shortness of breath. 30 vial 0   amiodarone (PACERONE) 200 MG tablet Take 1 tablet (200 mg total) by mouth 2 (two) times daily. 60 tablet 0   APIXABAN (ELIQUIS) VTE STARTER PACK (10MG AND 5MG) Take as directed on package: start with two-47m tablets twice daily for 7 days. On day 8, switch to one-571mtablet twice daily. 74 tablet 0   atorvastatin (LIPITOR) 20 MG tablet Take 1 tablet (20 mg total) by mouth daily. 30 tablet 0   Blood Glucose Monitoring Suppl (BLOOD GLUCOSE MONITOR SYSTEM) w/Device KIT USE AS DIRECTED 1 kit 0   budesonide-formoterol (SYMBICORT) 80-4.5 MCG/ACT inhaler Inhale 2 puffs into the lungs in the morning and at bedtime. 10.2 g 0   digoxin (LANOXIN) 0.125 MG tablet Take 1 tablet (0.125 mg total) by mouth daily. 30 tablet 0   fluticasone furoate-vilanterol (BREO ELLIPTA) 100-25 MCG/INH AEPB Inhale 1 puff into the lungs daily. 60 each 0   glucose blood test strip Use as directed 100 each 0   mometasone-formoterol (DULERA) 100-5 MCG/ACT AERO Inhale 1 puff into the lungs daily.     sacubitril-valsartan (ENTRESTO) 24-26 MG Take 1 tablet by mouth 2 (two) times daily. 60 tablet 0   spironolactone (ALDACTONE) 25 MG tablet Take 0.5 tablets (12.5 mg total) by mouth daily. 15 tablet 0   torsemide (DEMADEX) 20 MG tablet Take 2 tablets (40 mg total) by mouth 2 (two) times daily. 120 tablet 0   No current facility-administered medications for this encounter.    Allergies  Allergen Reactions   Lisinopril Cough      Social History   Socioeconomic History   Marital status: Single    Spouse name: Not on file   Number of children: Not on file   Years of education: Not on file   Highest education level: Not on file  Occupational History   Not on file  Tobacco Use   Smoking status: Former    Packs/day: 0.50    Types: Cigarettes     Quit date: 2018    Years since quitting: 4.5   Smokeless tobacco: Never  Substance and Sexual Activity   Alcohol use: Not Currently   Drug use: Not Currently    Types: Cocaine   Sexual activity: Not Currently  Other Topics Concern   Not on file  Social History Narrative   Not on file   Social Determinants of Health   Financial Resource Strain: Medium Risk   Difficulty of Paying Living Expenses: Somewhat hard  Food Insecurity: Food Insecurity Present   Worried About [place] the Last Year: Never true   Ran Out of Food in the Last Year: Sometimes true  Transportation Needs: Public librarian (Medical): Yes   Lack of Transportation (Non-Medical): Yes  Physical Activity: Inactive   Days of Exercise per Week: 0 days   Minutes of Exercise per Session: 0 min  Stress: Not on file  Social Connections: Not on file  Intimate Partner Violence: Not on file      Family History  Problem Relation Age of Onset   Breast cancer Mother    Heart attack Father 48   Heart attack Paternal Uncle 48    Vitals:   08/22/20 1512  BP: 124/86  Pulse: (!) 105  SpO2: 93%  Weight: 120.2 kg (265 lb)  Height: 6' (1.829 m)    Wt Readings from Last 3 Encounters:  08/22/20 120.2 kg (265 lb)  08/15/20 118.8 kg (262 lb)  04/25/20 128.7 kg (283 lb 12.8 oz)    PHYSICAL EXAM: General:  Walked in the clinic. No respiratory difficulty HEENT: normal Neck: supple. no JVD. Carotids 2+ bilat; no bruits. No lymphadenopathy or thryomegaly appreciated. Cor: PMI nondisplaced. Irregular rate & rhythm. No rubs, gallops or murmurs. Lungs: clear Abdomen: soft, nontender, nondistended. No hepatosplenomegaly. No bruits or masses. Good bowel sounds. Extremities: no cyanosis, clubbing, rash, edema Neuro: alert & oriented x 3, cranial nerves grossly intact. moves all 4 extremities w/o difficulty. Affect pleasant.  ECG: A Flutter 116 bpm    ASSESSMENT & PLAN: 1.  Chronic Systolic Heart Failure , NICM.  [facility] 07/2020 non obs CAD, low normal cardiac output.  -Echo 02/2020 EF 40-45% -->ECHO 07/2020 EF down 30-35% with RV moerately reduced, and RA/LA moderately dilated.RA 15. -NYHA II despite being back in A flutter.  - Volume status stable. Continue torsemide 40 mg twice a day .  -Continue entresto 24-26 mg twice a day.  - Continue digoxin 0.125 mg daily - Increase spironolactone to 25 mg daily. . -SGLT2i- consider next visit - Check BMET today   2. PAF Had ablation in 2019 at [facility].  Had DC/CV earlier this month with restoration of NSR. Back A flutter today .Refer to EP for ablation.  - Continue eliquis 5 mg twice a day.  -Increase amio to 400 mg twice a daily . I personally helped him with his pill box   3. OSA -Set up sleep study   4. Recurrent PE  -H/O PE back in February 2022. He was not taking eliquis. - CTA 07/2020 showed acute bilateral submassive PE. L/E [place] negative -> No indication for filter placement.  -Continue eliquis.   Referred to [facility] for PCP. Referred to HF Paramedicine. Needs help with medications.   Check CBC and BMET and repeat BMET in 7 days   Follow up in 3-4 weeks. Consider SGLT2i at that time    Darrick Grinder NP-C  3:55 PM

## 2020-08-22 NOTE — Patient Instructions (Signed)
Labwork done today --we will call only with abnormals Increase Amiodarone to 400 mg twice daily Spironolactone 25 mg daily Referral for Sleep study --they will call you to schedule Referral to [facility] and [facility] will call you to schedule Referral to AFIB Clinic--they will call you to schedule

## 2020-08-23 ENCOUNTER — Other Ambulatory Visit: Payer: Self-pay | Admitting: Obstetrics and Gynecology

## 2020-08-23 ENCOUNTER — Ambulatory Visit: Payer: Medicaid Other | Attending: Internal Medicine

## 2020-08-23 VITALS — BP 123/84 | HR 97

## 2020-08-23 DIAGNOSIS — M6281 Muscle weakness (generalized): Secondary | ICD-10-CM | POA: Insufficient documentation

## 2020-08-23 DIAGNOSIS — I4891 Unspecified atrial fibrillation: Secondary | ICD-10-CM

## 2020-08-23 DIAGNOSIS — R2689 Other abnormalities of gait and mobility: Secondary | ICD-10-CM | POA: Insufficient documentation

## 2020-08-23 NOTE — Therapy (Signed)
[facility] [address] [place], [place], [address] Phone: [phone]   Fax:  [phone]  Physical Therapy Evaluation  Patient Details  Name: Donald [name] MRN: [id number] Date of Birth: 1960-09-02 Referring Provider (PT): Briant Cedar, MD   Encounter Date: 08/23/2020   PT End of Session - 08/23/20 1042     Visit Number 1    Date for PT Re-Evaluation 10/18/20    PT Start Time 1015    PT Stop Time 1055    PT Time Calculation (min) 40 min    Activity Tolerance Patient tolerated treatment well    Behavior During Therapy [facility] for tasks assessed/performed             Past Medical History:  Diagnosis Date   Arrhythmia    A fib    Atrial fibrillation (HCC)    CHF (congestive heart failure) (HCC)    COPD (chronic obstructive pulmonary disease) (HCC)    Hypertension    Sleep apnea    Stroke [facility])     Past Surgical History:  Procedure Laterality Date   ABLATION     CARDIOVERSION N/A 03/12/2020   Procedure: CARDIOVERSION;  Surgeon: Chilton Si, MD;  Location: [facility] ENDOSCOPY;  Service: Cardiovascular;  Laterality: N/A;   CARDIOVERSION N/A 08/13/2020   Procedure: CARDIOVERSION;  Surgeon: Dolores Patty, MD;  Location: [facility] ENDOSCOPY;  Service: Cardiovascular;  Laterality: N/A;   INCISION AND DRAINAGE PERIRECTAL ABSCESS N/A 10/20/2012   Procedure: IRRIGATION AND DEBRIDEMENT PERIRECTAL ABSCESS;  Surgeon: Wilmon Arms. Corliss Skains, MD;  Location: [facility] OR;  Service: General;  Laterality: N/A;  Lithotomy   RIGHT/LEFT HEART CATH AND CORONARY ANGIOGRAPHY N/A 08/12/2020   Procedure: RIGHT/LEFT HEART CATH AND CORONARY ANGIOGRAPHY;  Surgeon: Dolores Patty, MD;  Location: [facility] INVASIVE CV LAB;  Service: Cardiovascular;  Laterality: N/A;   TEE WITHOUT CARDIOVERSION N/A 03/12/2020   Procedure: TRANSESOPHAGEAL ECHOCARDIOGRAM (TEE);  Surgeon: Chilton Si, MD;  Location: [facility] ENDOSCOPY;  Service: Cardiovascular;  Laterality: N/A;    TEE WITHOUT CARDIOVERSION N/A 08/13/2020   Procedure: TRANSESOPHAGEAL ECHOCARDIOGRAM (TEE);  Surgeon: Dolores Patty, MD;  Location: [facility] ENDOSCOPY;  Service: Cardiovascular;  Laterality: N/A;    Vitals:   08/23/20 1029  BP: 123/84  Pulse: 97  SpO2: 97%      Subjective Assessment - 08/23/20 1007     Subjective Recently hospitalized d/t a fib, also had B PE currently on blood thinners. reports feeling mms weakness and alot of fatigue with afib. Does report he will get chest pain when a fib is bad.    Pertinent History Paroxysmal atrial fibrillation.  Atrial fibrillation with RVR , hx of  Pulmonary embolism, HTN,    COPD, history of CVA and HLD. Recent hospitalization7/12/22 for chest pain with workup done for CHF.    Patient Stated Goals get stronger, more endurance.    Currently in Pain? No/denies                [facility] PT Assessment - 08/23/20 1004       Assessment   Medical Diagnosis abnormality of gait    Referring Provider (PT) Briant Cedar, MD    Prior Therapy a few PT sessions with recent hospitalization      Balance Screen   Has the patient fallen in the past 6 months No    Has the patient had a decrease in activity level because of a fear of falling?  Yes    Is the  patient reluctant to leave their home because of a fear of falling?  No      Home Environment   Additional Comments lives alone, split level  with a few steps reports no difficulty with stairs.      Prior Function   Level of Independence Independent with basic ADLs    Vocation On disability    Leisure joined [employer] last week, would like to go swimming. Was not doing exercise previously d/t a fib, extreme fatigue.      Cognition   Overall Cognitive Status Within Functional Limits for tasks assessed      ROM / Strength   AROM / PROM / Strength Strength      Strength   Overall Strength Comments BUE/BLE grossly 4/5 bilateral      Ambulation/Gait   Gait Comments trendelenberg, decreased  foot clearance B, slow      6 Minute Walk- Baseline   6 Minute Walk- Baseline yes      6 minute walk test results    Aerobic Endurance Distance Walked --   = 267 feet = 81.4 m (normative value for age is 20 m)     Standardized Balance Assessment   Standardized Balance Assessment Timed Up and Go Test;Dynamic Gait Index;Five Times Sit to Stand    Five times sit to stand comments  17 seconds, very fatigued, decr eccentric control.      Dynamic Gait Index   Level Surface Mild Impairment    Change in Gait Speed Mild Impairment    Gait with Horizontal Head Turns Mild Impairment    Gait with Vertical Head Turns Normal    Gait and Pivot Turn Mild Impairment    Step Over Obstacle Mild Impairment    Step Around Obstacles Mild Impairment    Steps Mild Impairment    Total Score 17      Timed Up and Go Test   Normal TUG (seconds) 13                        Objective measurements completed on examination: See above findings.               PT Education - 08/23/20 1052     Education Details PT POC and  safe completion of initial HEP.    Person(s) Educated Patient    Methods Explanation;Demonstration;Handout    Comprehension Verbalized understanding;Returned demonstration;Need further instruction              PT Short Term Goals - 08/23/20 1018       PT SHORT TERM GOAL #1   Title Independent with initial HEP    Time 2    Period Weeks    Status New    Target Date 09/06/20               PT Long Term Goals - 08/23/20 1018       PT LONG TERM GOAL #1   Title Independent with advanced HEP including gym exercises if appropriate    Time 8    Period Weeks    Status New    Target Date 10/18/20      PT LONG TERM GOAL #2   Title Pt will complete 5TSTS in </= 10 seconds to demonstrate improved balance and functional strength    Time 8    Period Weeks    Status New    Target Date 10/18/20      PT LONG TERM  GOAL #3   Title Improve to  at least 190 m with RPE </= 4/10 and good VSS to demonstrate improved aerobic capacity    Baseline = 267 feet = 81.4 m (normative value for age is 60 m)    Time 8    Period Weeks    Target Date 10/18/20      PT LONG TERM GOAL #4   Title improved strength to grossly 4+/5 to 5/5 bilaterally.    Time 8    Period Weeks    Status New    Target Date 10/18/20      PT LONG TERM GOAL #5   Title DGI imporved to at least 21/24 to demonstrate improved dynamic balance with ambulation    Baseline 17/24    Time 8    Period Weeks    Status New    Target Date 10/18/20                    Plan - 08/23/20 1056     Clinical Impression Statement Pt is a kind 60 yo male who was referred for gait abnormality. PMH signficant for A fib and COPD and he did have a recent hospitalization with multiple PEs. He currently presents with decreased BLE /core strength, decreased SLS, and overall diminished endurance/aerobic capacity. he will benefit from skilled PT to address these impairments, improve steadiness of gait, endurance, and work toward safe independent exercise program.    Stability/Clinical Decision Making Evolving/Moderate complexity    Clinical Decision Making Moderate    Rehab Potential Good    PT Frequency 1x / week    PT Duration 8 weeks    PT Treatment/Interventions ADLs/Self Care Home Management;Gait training;Functional mobility training;Therapeutic activities;Therapeutic exercise;Balance training;Neuromuscular re-education;Manual techniques;Energy conservation;Patient/family education    PT Next Visit Plan Reasses HEP and progress as tolerated. General strengthening and endurance training, flexibility if needed. Plan to progress to machine exercises if safe, alot of education to be provided regarding safe progressions of exercises    PT Home Exercise Plan see pt instructions    Consulted and Agree with Plan of Care Patient             Patient will benefit from skilled  therapeutic intervention in order to improve the following deficits and impairments:  Abnormal gait, Obesity, Improper body mechanics, Impaired flexibility, Decreased mobility, Decreased strength, Decreased balance, Cardiopulmonary status limiting activity, Decreased endurance  Visit Diagnosis: Other abnormalities of gait and mobility  Muscle weakness (generalized)     Problem List Patient Active Problem List   Diagnosis Date Noted   CAP (community acquired pneumonia) 08/06/2020   OSA on CPAP 08/06/2020   Leukocytosis 08/06/2020   Acute pulmonary embolism (HCC) 08/06/2020   Atrial fibrillation with RVR (HCC) 03/08/2020   Pulmonary embolism (HCC) 03/08/2020   Essential hypertension 03/08/2020   COPD (chronic obstructive pulmonary disease) (HCC) 03/08/2020   Anemia 03/08/2020   COPD exacerbation (HCC) 11/16/2014   Atypical chest pain 11/16/2014   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 [place], [place], [address] Phone: [phone]   Fax:  [phone]  Name: Donald [name] MRN: [id number] Date of Birth: [birth year]

## 2020-08-23 NOTE — Patient Instructions (Signed)
Access Code: [email or web address] URL: [email or web address] Date: 08/23/2020 Prepared by: Claude Manges  Program Notes Take breaks as needed    Exercises Sit to Stand with Counter Support - 1 x daily - 7 x weekly - 2 sets - 10 reps Standing March with Counter Support - 1 x daily - 7 x weekly - 2 sets - 10 reps Standing Hip Abduction with Counter Support - 1 x daily - 7 x weekly - 2 sets - 10 reps Standing Hip Extension with Counter Support - 1 x daily - 7 x weekly - 2 sets - 10 reps Heel Raises with Counter Support - 1 x daily - 7 x weekly - 2 sets - 10 reps

## 2020-08-23 NOTE — Patient Outreach (Signed)
Medicaid Managed Care   Nurse Care Manager Note  08/23/2020 Name:  Donald [name] MRN:  [id number] DOB:  1960-02-21  Donald [name] is an 60 y.o. year old male who is a primary patient of Donald [name].  The [name] Managed Care Coordination team was consulted for assistance with:    Chronic healthcare management needs.  Donald [name] was given information about Medicaid Managed Care Coordination team services today. Donald [name] agreed to services and verbal consent obtained.  Engaged with patient by telephone for follow up visit in response to provider referral for case management and/or care coordination services.   Assessments/Interventions:  Review of past medical history, allergies, medications, health status, including review of consultants reports, laboratory and other test data, was performed as part of comprehensive evaluation and provision of chronic care management services.  SDOH (Social Determinants of Health) assessments and interventions performed:   Care Plan  Allergies  Allergen Reactions   Lisinopril Cough    Medications Reviewed Today     Reviewed by Donald Medicus, RN (Registered Nurse) on 08/23/20 at 1400  Med List Status: <None>   Medication Order Taking? Sig Documenting Provider Last Dose Status Informant  Accu-Chek Softclix Lancets lancets [id number] No Use as directed Donald Friendly, [name] Taking Active   albuterol (PROVENTIL HFA;VENTOLIN HFA) 108 (90 Base) MCG/ACT inhaler [id number] No Inhale 2 puffs into the lungs every 4 (four) hours as needed for shortness of breath. Provider, Historical, [name] Taking Active Self  albuterol (PROVENTIL) (2.5 MG/3ML) 0.083% nebulizer solution [id number] No Take 3 mLs (2.5 mg total) by nebulization every 4 (four) hours as needed for wheezing or shortness of breath. Donald [name] Taking Active Self  amiodarone (PACERONE) 200 MG tablet [id number]  Take 2 tablets (400 mg total) by mouth 2 (two) times daily. Clegg, Amy D,  [name]  Active   APIXABAN Donald [name]) VTE STARTER PACK (10MG AND 5MG) [id number] No Take as directed on package: start with two-49m tablets twice daily for 7 days. On day 8, switch to one-514mtablet twice daily. Donald [name] Taking Active   atorvastatin (LIPITOR) 20 MG tablet [id number] Take 1 tablet (20 mg total) by mouth daily. Donald [name] Taking Active   Blood Glucose Monitoring Suppl (BLOOD GLUCOSE MONITOR SYSTEM) w/Device KIT [id number] USE AS DIRECTED Donald [name] Taking Active   budesonide-formoterol ([facility]80-4.5 MCG/ACT inhaler [id number] Inhale 2 puffs into the lungs in the morning and at bedtime. Donald [name] Taking Active   digoxin (LANOXIN) 0.125 MG tablet [id number] Take 1 tablet (0.125 mg total) by mouth daily. Donald [name] Taking Active   fluticasone furoate-vilanterol (BREO ELLIPTA) 100-25 MCG/INH AEPB [id number] Inhale 1 puff into the lungs daily. Donald [name] Taking Active   glucose blood test strip [id number] Use as directed Donald [name] Taking Active   mometasone-formoterol ([facility]100-5 MCG/ACT AERO [id number] Inhale 1 puff into the lungs daily. Provider, Historical, [name] Taking Active Self  sacubitril-valsartan (ENTRESTO) 24-26 MG [id number] Take 1 tablet by mouth 2 (two) times daily. Donald [name] Taking Active   spironolactone (ALDACTONE) 25 MG tablet [id number] 0.5 tablets (12.5 mg total) by mouth daily. Donald [name]  Active   torsemide (DEMADEX) 20 MG tablet [id number] Take 2 tablets (40 mg total) by mouth 2 (two) times daily. 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  Care Plan : General Plan of Care (Adult)  Updates made by Donald Medicus, RN since 08/23/2020 12:00 AM     Problem: Health Promotion or Disease Self-Management (General Plan of Care)   Priority: High  Onset Date: 08/23/2020     Long-Range Goal: Self-Management Plan Developed   Start Date: 08/23/2020  Expected End Date: 11/23/2020  This Visit's Progress: Not on track  Priority: High  Note:   Current Barriers:  Ineffective Self Health Maintenance Currently UNABLE TO independently self manage needs related to chronic health conditions.  Knowledge Deficits related to short term plan for care coordination needs and long term plans for chronic disease management needs related to HTN, COPD, Afib, HF, DM2, OSA Nurse Case Manager Clinical Goal(s):  patient will work with care management team to address care coordination and chronic disease management needs related to Disease Management Educational Needs Care Coordination Medication Management and Education Medication Reconciliation Medication Assistance    Interventions:  Evaluation of current treatment plan and patient's adherence to plan as established by provider. Reviewed medications with patient Collaborated with pharmacy regarding medications Discussed plans with patient for ongoing care management follow up and provided patient with direct contact information for  care management team Advised patient, providing education and rationale, to monitor blood pressure daily and record, calling provider for findings outside established parameters.  Reviewed scheduled/upcoming provider appointments. Advised patient, providing education and rationale, to check cbg  and record, calling provider  for findings outside established parameters.   Advised patient, providing education and rationale, to weigh daily and record, calling provider for weight gain of 3lbs overnight or 5 pounds in a week.  Care Guide referral for new PCP. Pharmacy referral for medication review. Self Care Activities:  Patient will self administer medications as prescribed Patient will attend all scheduled provider appointments Patient will call pharmacy for medication refills Patient will continue to perform ADL's independently Patient will continue to perform IADL's independently Patient will call provider office for new concerns or questions Patient Goals: In the next 30 days, patient will attend all appointments. In the next 30 days, patient will speak with Pharmacist. - Follow Up Plan: The patient has been provided with contact information for the care management team and has been advised to call with any health related questions or concerns.  The care management team will reach out to the patient again over the next 30 days.    Evidence-based guidance:   Review biopsychosocial determinants of health screens.  Review need for preventive screening based on age, sex, family history and health history.  Determine level of modifiable health risk.  Discuss identified risks.  Identify areas where behavior change may lead  to improved health.  Promote healthy lifestyle.  Evoke change talk using open-ended questions, pros and cons, as well as looking forward.  Identify and manage conditions or preconditions to reduce health risk.  Implement additional goals and interventions based on  identified risk factors.     Follow Up:  Patient agrees to Care Plan and Follow-up.  Plan: The Managed Medicaid care management team will reach out to the patient again over the next 30 days. and The patient has been provided with contact information for the Managed Medicaid care management team and has been advised to call with any health related questions or concerns.  Date/time of next scheduled RN care management/care coordination outreach: 09/23/20 at 330.

## 2020-08-23 NOTE — Patient Instructions (Signed)
Hi Mr. Senger, thank you for speaking with me today.  Mr. Nixon was given information about Medicaid Managed Care team care coordination services as a part of their [facility] Medicaid benefit. DECLIN RAJAN verbally consented to engagement with the [facility] Managed Care team.   If you are experiencing a medical emergency, please call 911 or report to your local emergency department or urgent care.   If you have a non-emergency medical problem during routine business hours, please contact your provider's office and ask to speak with a nurse.   For questions related to your [facility], please call: [phone] or visit the homepage here: [email or web address]  If you would like to schedule transportation through your [facility], please call the following number at least 2 days in advance of your appointment: [phone].   Call the Behavioral Health Crisis Line at 9304282603, at any time, 24 hours a day, 7 days a week. If you are in danger or need immediate medical attention call 911.  If you would like help to quit smoking, call 1-800-QUIT-NOW (832-433-0514) OR Espaol: 1-855-Djelo-Ya (1-583-094-0768) o para ms informacin haga clic aqu or Text READY to 088-110 to register via text  Mr. Holaway - following are the goals we discussed in your visit today:   Goals Addressed             This Visit's Progress    Protect My Health       Timeframe:  Long-Range Goal Priority:  High Start Date:       08/23/20                      Expected End Date:  ongoing                     Follow Up Date 09/23/2020    - schedule appointment for flu shot - schedule appointment for vaccines needed due to my age or health - schedule recommended health tests  - schedule and keep appointment for annual check-up    Why is this important?   Screening tests can find diseases  early when they are easier to treat.  Your doctor or nurse will talk with you about which tests are important for you.  Getting shots for common diseases like the flu and shingles will help prevent them.     Notes:       The patient verbalizes understanding of instructions provided today.  The Managed Medicaid care management team will reach out to the patient again over the next 30 days.  The  Patient has been provided with contact information for the Managed Medicaid care management team and has been advised to call with any health related questions or concerns.   Kathi Der RN, BSN [place]  [employer], production - Managed Medicaid High Risk [phone].    Following is a copy of your plan of care:  Patient Care Plan: General Plan of Care (Adult)     Problem Identified: Health Promotion or Disease Self-Management (General Plan of Care)   Priority: High  Onset Date: 08/23/2020     Long-Range Goal: Self-Management Plan Developed   Start Date: 08/23/2020  Expected End Date: 11/23/2020  This Visit's Progress: Not on track  Priority: High  Note:   Current Barriers:  Ineffective Self Health Maintenance Currently UNABLE TO independently self manage needs related to chronic health conditions.  Knowledge Deficits related  to short term plan for care coordination needs and long term plans for chronic disease management needs related to HTN, COPD, Afib, HF, DM2, OSA Nurse Case Manager Clinical Goal(s):  patient will work with care management team to address care coordination and chronic disease management needs related to Disease Management Educational Needs Care Coordination Medication Management and Education Medication Reconciliation Medication Assistance    Interventions:  Evaluation of current treatment plan and patient's adherence to plan as established by provider. Reviewed medications with patient Collaborated with pharmacy regarding  medications Discussed plans with patient for ongoing care management follow up and provided patient with direct contact information for care management team Advised patient, providing education and rationale, to monitor blood pressure daily and record, calling provider for findings outside established parameters.  Reviewed scheduled/upcoming provider appointments. Advised patient, providing education and rationale, to check cbg  and record, calling provider  for findings outside established parameters.   Advised patient, providing education and rationale, to weigh daily and record, calling provider for weight gain of 3lbs overnight or 5 pounds in a week.  Care Guide referral for new PCP. Pharmacy referral for medication review. Self Care Activities:  Patient will self administer medications as prescribed Patient will attend all scheduled provider appointments Patient will call pharmacy for medication refills Patient will continue to perform ADL's independently Patient will continue to perform IADL's independently Patient will call provider office for new concerns or questions Patient Goals: In the next 30 days, patient will attend all appointments. In the next 30 days, patient will speak with Pharmacist. - Follow Up Plan: The patient has been provided with contact information for the care management team and has been advised to call with any health related questions or concerns.  The care management team will reach out to the patient again over the next 30 days.    Evidence-based guidance:   Review biopsychosocial determinants of health screens.  Review need for preventive screening based on age, sex, family history and health history.  Determine level of modifiable health risk.  Discuss identified risks.  Identify areas where behavior change may lead to improved health.  Promote healthy lifestyle.  Evoke change talk using open-ended questions, pros and cons, as well as looking forward.   Identify and manage conditions or preconditions to reduce health risk.  Implement additional goals and interventions based on identified risk factors.

## 2020-08-26 ENCOUNTER — Telehealth (HOSPITAL_COMMUNITY): Payer: Self-pay

## 2020-08-26 NOTE — Telephone Encounter (Signed)
Left message for Mr. Mobley to return my call to establish Paramedicine. Will continue to reach out.

## 2020-08-27 ENCOUNTER — Telehealth: Payer: Self-pay

## 2020-08-27 NOTE — Telephone Encounter (Signed)
   Telephone encounter was:  Unsuccessful.  08/27/2020 Name: Donald [name] MRN: [id number] DOB: 1960/10/25  Unsuccessful outbound call made today to assist with:  Attempted to call [phone] call would not connect.  Outreach Attempt:  1st Attempt  A HIPAA compliant voice message was left requesting a return call.  Instructed patient to call back at [phone].  Jariyah Hackley, AAS Paralegal, [facility] Care Guide  Embedded Care Coordination [place]  Care Management  [address] [place], [place] [address] [email or web address]  ?? [phone]   [email or web address]

## 2020-08-29 ENCOUNTER — Other Ambulatory Visit: Payer: Self-pay

## 2020-08-29 ENCOUNTER — Ambulatory Visit (HOSPITAL_COMMUNITY)
Admission: RE | Admit: 2020-08-29 | Discharge: 2020-08-29 | Disposition: A | Discharge status: Home / Self Care | Payer: Medicaid Other | Source: Ambulatory Visit | Attending: Physician Assistant | Admitting: Physician Assistant

## 2020-08-29 ENCOUNTER — Other Ambulatory Visit (HOSPITAL_COMMUNITY): Payer: Self-pay | Admitting: Physician Assistant

## 2020-08-29 VITALS — BP 114/74 | HR 105 | Ht 72.0 in | Wt 265.0 lb

## 2020-08-29 DIAGNOSIS — J449 Chronic obstructive pulmonary disease, unspecified: Secondary | ICD-10-CM | POA: Insufficient documentation

## 2020-08-29 DIAGNOSIS — Z87891 Personal history of nicotine dependence: Secondary | ICD-10-CM | POA: Insufficient documentation

## 2020-08-29 DIAGNOSIS — Z7901 Long term (current) use of anticoagulants: Secondary | ICD-10-CM | POA: Diagnosis not present

## 2020-08-29 DIAGNOSIS — Z8673 Personal history of transient ischemic attack (TIA), and cerebral infarction without residual deficits: Secondary | ICD-10-CM | POA: Diagnosis not present

## 2020-08-29 DIAGNOSIS — G4733 Obstructive sleep apnea (adult) (pediatric): Secondary | ICD-10-CM | POA: Insufficient documentation

## 2020-08-29 DIAGNOSIS — I484 Atypical atrial flutter: Secondary | ICD-10-CM | POA: Insufficient documentation

## 2020-08-29 DIAGNOSIS — Z86711 Personal history of pulmonary embolism: Secondary | ICD-10-CM | POA: Insufficient documentation

## 2020-08-29 DIAGNOSIS — I110 Hypertensive heart disease with heart failure: Secondary | ICD-10-CM | POA: Insufficient documentation

## 2020-08-29 DIAGNOSIS — Z8249 Family history of ischemic heart disease and other diseases of the circulatory system: Secondary | ICD-10-CM | POA: Diagnosis not present

## 2020-08-29 DIAGNOSIS — Z79899 Other long term (current) drug therapy: Secondary | ICD-10-CM | POA: Insufficient documentation

## 2020-08-29 DIAGNOSIS — I4892 Unspecified atrial flutter: Secondary | ICD-10-CM | POA: Insufficient documentation

## 2020-08-29 DIAGNOSIS — D6869 Other thrombophilia: Secondary | ICD-10-CM | POA: Diagnosis not present

## 2020-08-29 DIAGNOSIS — I5032 Chronic diastolic (congestive) heart failure: Secondary | ICD-10-CM | POA: Insufficient documentation

## 2020-08-29 DIAGNOSIS — E669 Obesity, unspecified: Secondary | ICD-10-CM | POA: Insufficient documentation

## 2020-08-29 DIAGNOSIS — I4819 Other persistent atrial fibrillation: Secondary | ICD-10-CM | POA: Diagnosis present

## 2020-08-29 DIAGNOSIS — Z6835 Body mass index (BMI) 35.0-35.9, adult: Secondary | ICD-10-CM | POA: Insufficient documentation

## 2020-08-29 LAB — BASIC METABOLIC PANEL
Anion gap: 10 (ref 5–15)
BUN: 20 mg/dL (ref 6–20)
CO2: 27 mmol/L (ref 22–32)
Calcium: 9.7 mg/dL (ref 8.9–10.3)
Chloride: 97 mmol/L — ABNORMAL LOW (ref 98–111)
Creatinine, Ser: 1.52 mg/dL — ABNORMAL HIGH (ref 0.61–1.24)
GFR, Estimated: 53 mL/min — ABNORMAL LOW (ref >60)
Glucose, Bld: 225 mg/dL — ABNORMAL HIGH (ref 70–99)
Potassium: 3.6 mmol/L (ref 3.5–5.1)
Sodium: 134 mmol/L — ABNORMAL LOW (ref 135–145)

## 2020-08-29 LAB — CBC
HCT: 49.6 % (ref 39.0–52.0)
Hemoglobin: 15.2 g/dL (ref 13.0–17.0)
MCH: 25.8 pg — ABNORMAL LOW (ref 26.0–34.0)
MCHC: 30.6 g/dL (ref 30.0–36.0)
MCV: 84.1 fL (ref 80.0–100.0)
Platelets: 257 10*3/uL (ref 150–400)
RBC: 5.9 MIL/uL — ABNORMAL HIGH (ref 4.22–5.81)
RDW: 16.2 % — ABNORMAL HIGH (ref 11.5–15.5)
WBC: 6.3 10*3/uL (ref 4.0–10.5)
nRBC: 0 % (ref 0.0–0.2)

## 2020-08-29 NOTE — Patient Instructions (Signed)
MORNING OF: Decrease Amiodarone to 200mg  (Take one tablet by mouth twice daily) EP clinic- will contact regarding appointment   Cardioversion scheduled for August 8th 2022  - Arrive at the [facility] and go to admitting at 9:15am  - Do not eat or drink anything after midnight the night prior to your procedure.  - Take all your morning medication (except diabetic medications) with a sip of water prior to arrival.  - You will not be able to drive home after your procedure.  - Do NOT miss any doses of your blood thinner - if you should miss a dose please notify our office immediately.  - If you feel as if you go back into normal rhythm prior to scheduled cardioversion, please notify our office immediately. If your procedure is canceled in the cardioversion suite you will be charged a cancellation fee.  Patients will be asked to: to mask in public and hand hygiene (no longer quarantine) in the 3 days prior to surgery, to report if any COVID-19-like illness or household contacts to COVID-19 to determine need for testing

## 2020-08-29 NOTE — H&P (View-Only) (Signed)
Primary Care Physician: Lucianne Lei, MD Primary Cardiologist: Dr Minna Merritts Referring Physician: Darrick Grinder AHFC: Dr Leonides Grills is a 60 y.o. male with a history of chronic HFrEF, atrial fibrillation/flutter with flutter ablation in 2019 by Dr Minna Merritts, hypertension, diabetes, OSA on CPAP, COPD and prior CVA who presents for consultation in the [facility]. Patient is on Eliquis for a CHADS2VASC score of 5. Patient was admitted 08/06/20 with chest pain and SOB. He was found to have a submassive bilateral PE with evidence of RV strain. He admitted to noncompliance with his medication because they were "confusing". His Eliquis was resumed, he was diuresed, and underwent TEE/DCCV on 08/13/20. Unfortunately, he was back in atrial flutter on follow up with Amy Clegg on 08/22/20. His amiodarone was increased to 400 mg BID and referred to AF clinic.   Today, he reports that he feels "OK" but does have fatigue and SOB with exertion. He remains in atrial flutter but his rates are mildly improved. He adamantly denies any missed doses of anticoagulation since his hospitalization.   Today, he denies symptoms of palpitations, chest pain, orthopnea, PND, lower extremity edema, dizziness, presyncope, syncope, bleeding, or neurologic sequela. The patient is tolerating medications without difficulties and is otherwise without complaint today.    Atrial Fibrillation Risk Factors:  he does have symptoms or diagnosis of sleep apnea. he is compliant with CPAP therapy. he does not have a history of rheumatic fever. he does have a history of alcohol use.   he has a BMI of Body mass index is 35.94 kg/m.Marland Kitchen Filed Weights   08/29/20 1448  Weight: 120.2 kg    Family History  Problem Relation Age of Onset   Breast cancer Mother    Heart attack Father 35   Heart attack Paternal Uncle 31     Atrial Fibrillation Management history:  Previous antiarrhythmic drugs:  amiodarone  Previous cardioversions: 03/12/20, 08/13/20 Previous ablations: flutter 2015 w/ Dr Windell Moment score: 5 Anticoagulation history: Eliquis   Past Medical History:  Diagnosis Date   Arrhythmia    A fib    Atrial fibrillation (HCC)    CHF (congestive heart failure) (HCC)    COPD (chronic obstructive pulmonary disease) ([place])    Hypertension    Sleep apnea    Stroke [facility])    Past Surgical History:  Procedure Laterality Date   ABLATION     CARDIOVERSION N/A 03/12/2020   Procedure: CARDIOVERSION;  Surgeon: Skeet Latch, MD;  Location: [place];  Service: Cardiovascular;  Laterality: N/A;   CARDIOVERSION N/A 08/13/2020   Procedure: CARDIOVERSION;  Surgeon: Jolaine Artist, MD;  Location: [place];  Service: Cardiovascular;  Laterality: N/A;   INCISION AND DRAINAGE PERIRECTAL ABSCESS N/A 10/20/2012   Procedure: IRRIGATION AND DEBRIDEMENT PERIRECTAL ABSCESS;  Surgeon: Imogene Burn. Georgette Dover, MD;  Location: [place];  Service: General;  Laterality: N/A;  Lithotomy   RIGHT/LEFT HEART CATH AND CORONARY ANGIOGRAPHY N/A 08/12/2020   Procedure: RIGHT/LEFT HEART CATH AND CORONARY ANGIOGRAPHY;  Surgeon: Jolaine Artist, MD;  Location: [place] [facility];  Service: Cardiovascular;  Laterality: N/A;   TEE WITHOUT CARDIOVERSION N/A 03/12/2020   Procedure: TRANSESOPHAGEAL ECHOCARDIOGRAM (TEE);  Surgeon: Skeet Latch, MD;  Location: [place];  Service: Cardiovascular;  Laterality: N/A;   TEE WITHOUT CARDIOVERSION N/A 08/13/2020   Procedure: TRANSESOPHAGEAL ECHOCARDIOGRAM (TEE);  Surgeon: Jolaine Artist, MD;  Location: [facility] ENDOSCOPY;  Service: Cardiovascular;  Laterality: N/A;    Current Outpatient  Medications  Medication Sig Dispense Refill   Accu-Chek Softclix Lancets lancets Use as directed 100 each 0   albuterol (PROVENTIL HFA;VENTOLIN HFA) 108 (90 Base) MCG/ACT inhaler Inhale 2 puffs into the lungs every 4 (four) hours as needed for shortness of breath.      albuterol (PROVENTIL) (2.5 MG/3ML) 0.083% nebulizer solution Take 3 mLs (2.5 mg total) by nebulization every 4 (four) hours as needed for wheezing or shortness of breath. 30 vial 0   amiodarone (PACERONE) 200 MG tablet Take 2 tablets (400 mg total) by mouth 2 (two) times daily. 120 tablet 0   APIXABAN (ELIQUIS) VTE STARTER PACK (10MG AND 5MG) Take as directed on package: start with two-74m tablets twice daily for 7 days. On day 8, switch to one-539mtablet twice daily. 74 tablet 0   atorvastatin (LIPITOR) 20 MG tablet Take 1 tablet (20 mg total) by mouth daily. 30 tablet 0   Blood Glucose Monitoring Suppl (BLOOD GLUCOSE MONITOR SYSTEM) w/Device KIT USE AS DIRECTED 1 kit 0   budesonide-formoterol (SYMBICORT) 80-4.5 MCG/ACT inhaler Inhale 2 puffs into the lungs in the morning and at bedtime. 10.2 g 0   digoxin (LANOXIN) 0.125 MG tablet Take 1 tablet (0.125 mg total) by mouth daily. 30 tablet 0   fluticasone furoate-vilanterol (BREO ELLIPTA) 100-25 MCG/INH AEPB Inhale 1 puff into the lungs daily. 60 each 0   glucose blood test strip Use as directed 100 each 0   mometasone-formoterol (DULERA) 100-5 MCG/ACT AERO Inhale 1 puff into the lungs daily.     sacubitril-valsartan (ENTRESTO) 24-26 MG Take 1 tablet by mouth 2 (two) times daily. 60 tablet 0   spironolactone (ALDACTONE) 25 MG tablet Take 0.5 tablets (12.5 mg total) by mouth daily. 15 tablet 0   torsemide (DEMADEX) 20 MG tablet Take 2 tablets (40 mg total) by mouth 2 (two) times daily. 120 tablet 0   No current facility-administered medications for this encounter.    No Active Allergies  Social History   Socioeconomic History   Marital status: Single    Spouse name: Not on file   Number of children: Not on file   Years of education: Not on file   Highest education level: Not on file  Occupational History   Not on file  Tobacco Use   Smoking status: Former    Packs/day: 0.50    Types: Cigarettes    Quit date: 2018    Years since  quitting: 4.5   Smokeless tobacco: Never  Substance and Sexual Activity   Alcohol use: Not Currently   Drug use: Not Currently    Types: Cocaine   Sexual activity: Not Currently  Other Topics Concern   Not on file  Social History Narrative   Not on file   Social Determinants of Health   Financial Resource Strain: Medium Risk   Difficulty of Paying Living Expenses: Somewhat hard  Food Insecurity: Food Insecurity Present   Worried About RuCharity fundraisern the Last Year: Never true   RaArboriculturistn the Last Year: Sometimes true  Transportation Needs: Unmet Transportation Needs   Lack of Transportation (Medical): Yes   Lack of Transportation (Non-Medical): Yes  Physical Activity: Inactive   Days of Exercise per Week: 0 days   Minutes of Exercise per Session: 0 min  Stress: Not on file  Social Connections: Not on file  Intimate Partner Violence: Not on file     ROS- All systems are reviewed and negative except as per  the HPI above.  Physical Exam: Vitals:   08/29/20 1448  BP: 114/74  Pulse: (!) 105  Weight: 120.2 kg  Height: 6' (1.829 m)    GEN- The patient is a well appearing obese male, alert and oriented x 3 today.   Head- normocephalic, atraumatic Eyes-  Sclera clear, conjunctiva pink Ears- hearing intact Oropharynx- clear Neck- supple  Lungs- Clear to ausculation bilaterally, normal work of breathing Heart- irregular rate and rhythm, no murmurs, rubs or gallops  GI- soft, NT, ND, + BS Extremities- no clubbing, cyanosis, or edema MS- no significant deformity or atrophy Skin- no rash or lesion Psych- euthymic mood, full affect Neuro- strength and sensation are intact  Wt Readings from Last 3 Encounters:  08/29/20 120.2 kg  08/22/20 120.2 kg  08/15/20 118.8 kg    EKG today demonstrates  Atypical atrial flutter with variable block Vent. rate 105 BPM PR interval * ms QRS duration 102 ms QT/QTcB 396/523 ms  Echo 08/07/20 demonstrated   1. Left  ventricular ejection fraction, by estimation, is 30 to 35%. The  left ventricle has moderately decreased function. The left ventricle  demonstrates global hypokinesis. The left ventricular internal cavity size was mildly dilated. There is mild left ventricular hypertrophy. Left ventricular diastolic parameters are indeterminate.   2. Right ventricular systolic function is moderately reduced. The right  ventricular size is moderately enlarged. There is moderately elevated  pulmonary artery systolic pressure. The estimated right ventricular  systolic pressure is 51.7 mmHg.   3. Left atrial size was moderately dilated.   4. Right atrial size was moderately dilated.   5. The mitral valve is normal in structure. Mild mitral valve  regurgitation. No evidence of mitral stenosis.   6. The aortic valve is tricuspid. Aortic valve regurgitation is trivial.  Mild to moderate aortic valve sclerosis/calcification is present, without  any evidence of aortic stenosis.   7. Aortic dilatation noted. There is mild dilatation of the ascending  aorta, measuring 42 mm.   8. The inferior vena cava is dilated in size with <50% respiratory  variability, suggesting right atrial pressure of 15 mmHg.   9. The patient is in atrial fibrillation.   Epic records are reviewed at length today  CHA2DS2-VASc Score = 5  The patient's score is based upon: CHF History: Yes HTN History: Yes Diabetes History: Yes Stroke History: Yes Vascular Disease History: No Age Score: 0 Gender Score: 0      ASSESSMENT AND PLAN: 1. Persistent Atrial Fibrillation/atrial flutter The patient's CHA2DS2-VASc score is 5, indicating a 7.2% annual risk of stroke.   S/p DCCV 08/13/20 with early return of atrial flutter We discussed therapeutic options today. Will repeat DCCV after reloading amiodarone.  Continue amiodarone 400 mg BID until DCCV and then decrease to 200 mg BID. We discussed referral from [facility] for ablation. Offered to have him  go back to Dr Minna Merritts. He expressed his desire to have all his cardiac care at [facility]. Will refer to EP per [facility]. Continue Eliquis 5 mg BID Continue digoxin 0.125 mg daily  2. Secondary Hypercoagulable State (ICD10:  D68.69) The patient is at significant risk for stroke/thromboembolism based upon his CHA2DS2-VASc Score of 5.  Continue Apixaban (Eliquis).   3. Obesity Body mass index is 35.94 kg/m. Lifestyle modification was discussed at length including regular exercise and weight reduction.  4. Obstructive sleep apnea The importance of adequate treatment of sleep apnea was discussed today in order to improve our ability to maintain sinus rhythm long  term. Patient reports compliance with CPAP therapy.  5. Chronic systolic CHF Volume status appears stable today. Check bmet as above. GDMT per AHFC.   Follow up with EP post DCCV.    Ricky Gale Hulse PA-C [facility] [address] [place], [place] [address] [phone] 08/29/2020 3:54 PM  

## 2020-08-29 NOTE — Progress Notes (Signed)
Primary Care Physician: Lucianne Lei, MD Primary Cardiologist: Dr Minna Merritts Referring Physician: Darrick Grinder AHFC: Dr Leonides Grills is a 60 y.o. male with a history of chronic HFrEF, atrial fibrillation/flutter with flutter ablation in 2019 by Dr Minna Merritts, hypertension, diabetes, OSA on CPAP, COPD and prior CVA who presents for consultation in the [facility]. Patient is on Eliquis for a CHADS2VASC score of 5. Patient was admitted 08/06/20 with chest pain and SOB. He was found to have a submassive bilateral PE with evidence of RV strain. He admitted to noncompliance with his medication because they were "confusing". His Eliquis was resumed, he was diuresed, and underwent TEE/DCCV on 08/13/20. Unfortunately, he was back in atrial flutter on follow up with Amy Clegg on 08/22/20. His amiodarone was increased to 400 mg BID and referred to AF clinic.   Today, he reports that he feels "OK" but does have fatigue and SOB with exertion. He remains in atrial flutter but his rates are mildly improved. He adamantly denies any missed doses of anticoagulation since his hospitalization.   Today, he denies symptoms of palpitations, chest pain, orthopnea, PND, lower extremity edema, dizziness, presyncope, syncope, bleeding, or neurologic sequela. The patient is tolerating medications without difficulties and is otherwise without complaint today.    Atrial Fibrillation Risk Factors:  he does have symptoms or diagnosis of sleep apnea. he is compliant with CPAP therapy. he does not have a history of rheumatic fever. he does have a history of alcohol use.   he has a BMI of Body mass index is 35.94 kg/m.Marland Kitchen Filed Weights   08/29/20 1448  Weight: 120.2 kg    Family History  Problem Relation Age of Onset   Breast cancer Mother    Heart attack Father 35   Heart attack Paternal Uncle 31     Atrial Fibrillation Management history:  Previous antiarrhythmic drugs:  amiodarone  Previous cardioversions: 03/12/20, 08/13/20 Previous ablations: flutter 2015 w/ Dr Windell Moment score: 5 Anticoagulation history: Eliquis   Past Medical History:  Diagnosis Date   Arrhythmia    A fib    Atrial fibrillation (HCC)    CHF (congestive heart failure) (HCC)    COPD (chronic obstructive pulmonary disease) ([place])    Hypertension    Sleep apnea    Stroke [facility])    Past Surgical History:  Procedure Laterality Date   ABLATION     CARDIOVERSION N/A 03/12/2020   Procedure: CARDIOVERSION;  Surgeon: Skeet Latch, MD;  Location: [place];  Service: Cardiovascular;  Laterality: N/A;   CARDIOVERSION N/A 08/13/2020   Procedure: CARDIOVERSION;  Surgeon: Jolaine Artist, MD;  Location: [place];  Service: Cardiovascular;  Laterality: N/A;   INCISION AND DRAINAGE PERIRECTAL ABSCESS N/A 10/20/2012   Procedure: IRRIGATION AND DEBRIDEMENT PERIRECTAL ABSCESS;  Surgeon: Imogene Burn. Georgette Dover, MD;  Location: [place];  Service: General;  Laterality: N/A;  Lithotomy   RIGHT/LEFT HEART CATH AND CORONARY ANGIOGRAPHY N/A 08/12/2020   Procedure: RIGHT/LEFT HEART CATH AND CORONARY ANGIOGRAPHY;  Surgeon: Jolaine Artist, MD;  Location: [place] [facility];  Service: Cardiovascular;  Laterality: N/A;   TEE WITHOUT CARDIOVERSION N/A 03/12/2020   Procedure: TRANSESOPHAGEAL ECHOCARDIOGRAM (TEE);  Surgeon: Skeet Latch, MD;  Location: [place];  Service: Cardiovascular;  Laterality: N/A;   TEE WITHOUT CARDIOVERSION N/A 08/13/2020   Procedure: TRANSESOPHAGEAL ECHOCARDIOGRAM (TEE);  Surgeon: Jolaine Artist, MD;  Location: [facility] ENDOSCOPY;  Service: Cardiovascular;  Laterality: N/A;    Current Outpatient  Medications  Medication Sig Dispense Refill   Accu-Chek Softclix Lancets lancets Use as directed 100 each 0   albuterol (PROVENTIL HFA;VENTOLIN HFA) 108 (90 Base) MCG/ACT inhaler Inhale 2 puffs into the lungs every 4 (four) hours as needed for shortness of breath.      albuterol (PROVENTIL) (2.5 MG/3ML) 0.083% nebulizer solution Take 3 mLs (2.5 mg total) by nebulization every 4 (four) hours as needed for wheezing or shortness of breath. 30 vial 0   amiodarone (PACERONE) 200 MG tablet Take 2 tablets (400 mg total) by mouth 2 (two) times daily. 120 tablet 0   APIXABAN (ELIQUIS) VTE STARTER PACK (10MG AND 5MG) Take as directed on package: start with two-62m tablets twice daily for 7 days. On day 8, switch to one-539mtablet twice daily. 74 tablet 0   atorvastatin (LIPITOR) 20 MG tablet Take 1 tablet (20 mg total) by mouth daily. 30 tablet 0   Blood Glucose Monitoring Suppl (BLOOD GLUCOSE MONITOR SYSTEM) w/Device KIT USE AS DIRECTED 1 kit 0   budesonide-formoterol (SYMBICORT) 80-4.5 MCG/ACT inhaler Inhale 2 puffs into the lungs in the morning and at bedtime. 10.2 g 0   digoxin (LANOXIN) 0.125 MG tablet Take 1 tablet (0.125 mg total) by mouth daily. 30 tablet 0   fluticasone furoate-vilanterol (BREO ELLIPTA) 100-25 MCG/INH AEPB Inhale 1 puff into the lungs daily. 60 each 0   glucose blood test strip Use as directed 100 each 0   mometasone-formoterol (DULERA) 100-5 MCG/ACT AERO Inhale 1 puff into the lungs daily.     sacubitril-valsartan (ENTRESTO) 24-26 MG Take 1 tablet by mouth 2 (two) times daily. 60 tablet 0   spironolactone (ALDACTONE) 25 MG tablet Take 0.5 tablets (12.5 mg total) by mouth daily. 15 tablet 0   torsemide (DEMADEX) 20 MG tablet Take 2 tablets (40 mg total) by mouth 2 (two) times daily. 120 tablet 0   No current facility-administered medications for this encounter.    No Active Allergies  Social History   Socioeconomic History   Marital status: Single    Spouse name: Not on file   Number of children: Not on file   Years of education: Not on file   Highest education level: Not on file  Occupational History   Not on file  Tobacco Use   Smoking status: Former    Packs/day: 0.50    Types: Cigarettes    Quit date: 2018    Years since  quitting: 4.5   Smokeless tobacco: Never  Substance and Sexual Activity   Alcohol use: Not Currently   Drug use: Not Currently    Types: Cocaine   Sexual activity: Not Currently  Other Topics Concern   Not on file  Social History Narrative   Not on file   Social Determinants of Health   Financial Resource Strain: Medium Risk   Difficulty of Paying Living Expenses: Somewhat hard  Food Insecurity: Food Insecurity Present   Worried About RuCharity fundraisern the Last Year: Never true   RaArboriculturistn the Last Year: Sometimes true  Transportation Needs: Unmet Transportation Needs   Lack of Transportation (Medical): Yes   Lack of Transportation (Non-Medical): Yes  Physical Activity: Inactive   Days of Exercise per Week: 0 days   Minutes of Exercise per Session: 0 min  Stress: Not on file  Social Connections: Not on file  Intimate Partner Violence: Not on file     ROS- All systems are reviewed and negative except as per  the HPI above.  Physical Exam: Vitals:   08/29/20 1448  BP: 114/74  Pulse: (!) 105  Weight: 120.2 kg  Height: 6' (1.829 m)    GEN- The patient is a well appearing obese male, alert and oriented x 3 today.   Head- normocephalic, atraumatic Eyes-  Sclera clear, conjunctiva pink Ears- hearing intact Oropharynx- clear Neck- supple  Lungs- Clear to ausculation bilaterally, normal work of breathing Heart- irregular rate and rhythm, no murmurs, rubs or gallops  GI- soft, NT, ND, + BS Extremities- no clubbing, cyanosis, or edema MS- no significant deformity or atrophy Skin- no rash or lesion Psych- euthymic mood, full affect Neuro- strength and sensation are intact  Wt Readings from Last 3 Encounters:  08/29/20 120.2 kg  08/22/20 120.2 kg  08/15/20 118.8 kg    EKG today demonstrates  Atypical atrial flutter with variable block Vent. rate 105 BPM PR interval * ms QRS duration 102 ms QT/QTcB 396/523 ms  Echo 08/07/20 demonstrated   1. Left  ventricular ejection fraction, by estimation, is 30 to 35%. The  left ventricle has moderately decreased function. The left ventricle  demonstrates global hypokinesis. The left ventricular internal cavity size was mildly dilated. There is mild left ventricular hypertrophy. Left ventricular diastolic parameters are indeterminate.   2. Right ventricular systolic function is moderately reduced. The right  ventricular size is moderately enlarged. There is moderately elevated  pulmonary artery systolic pressure. The estimated right ventricular  systolic pressure is 51.7 mmHg.   3. Left atrial size was moderately dilated.   4. Right atrial size was moderately dilated.   5. The mitral valve is normal in structure. Mild mitral valve  regurgitation. No evidence of mitral stenosis.   6. The aortic valve is tricuspid. Aortic valve regurgitation is trivial.  Mild to moderate aortic valve sclerosis/calcification is present, without  any evidence of aortic stenosis.   7. Aortic dilatation noted. There is mild dilatation of the ascending  aorta, measuring 42 mm.   8. The inferior vena cava is dilated in size with <50% respiratory  variability, suggesting right atrial pressure of 15 mmHg.   9. The patient is in atrial fibrillation.   Epic records are reviewed at length today  CHA2DS2-VASc Score = 5  The patient's score is based upon: CHF History: Yes HTN History: Yes Diabetes History: Yes Stroke History: Yes Vascular Disease History: No Age Score: 0 Gender Score: 0      ASSESSMENT AND PLAN: 1. Persistent Atrial Fibrillation/atrial flutter The patient's CHA2DS2-VASc score is 5, indicating a 7.2% annual risk of stroke.   S/p DCCV 08/13/20 with early return of atrial flutter We discussed therapeutic options today. Will repeat DCCV after reloading amiodarone.  Continue amiodarone 400 mg BID until DCCV and then decrease to 200 mg BID. We discussed referral from [facility] for ablation. Offered to have him  go back to Dr Minna Merritts. He expressed his desire to have all his cardiac care at [facility]. Will refer to EP per [facility]. Continue Eliquis 5 mg BID Continue digoxin 0.125 mg daily  2. Secondary Hypercoagulable State (ICD10:  D68.69) The patient is at significant risk for stroke/thromboembolism based upon his CHA2DS2-VASc Score of 5.  Continue Apixaban (Eliquis).   3. Obesity Body mass index is 35.94 kg/m. Lifestyle modification was discussed at length including regular exercise and weight reduction.  4. Obstructive sleep apnea The importance of adequate treatment of sleep apnea was discussed today in order to improve our ability to maintain sinus rhythm long  term. Patient reports compliance with CPAP therapy.  5. Chronic systolic CHF Volume status appears stable today. Check bmet as above. GDMT per [facility].   Follow up with EP post DCCV.    [facility] [address] [place], [place] [address] [phone] 08/29/2020 3:54 PM

## 2020-08-30 ENCOUNTER — Ambulatory Visit: Payer: Medicaid Other | Admitting: Physical Therapy

## 2020-09-02 ENCOUNTER — Other Ambulatory Visit: Payer: Self-pay

## 2020-09-02 ENCOUNTER — Ambulatory Visit (HOSPITAL_COMMUNITY)
Admission: RE | Admit: 2020-09-02 | Discharge: 2020-09-02 | Disposition: A | Discharge status: Home / Self Care | Payer: Medicaid Other | Attending: Internal Medicine | Admitting: Internal Medicine

## 2020-09-02 ENCOUNTER — Encounter (HOSPITAL_COMMUNITY): Payer: Self-pay | Admitting: Internal Medicine

## 2020-09-02 ENCOUNTER — Ambulatory Visit (HOSPITAL_COMMUNITY): Payer: Medicaid Other | Admitting: Anesthesiology

## 2020-09-02 ENCOUNTER — Encounter (HOSPITAL_COMMUNITY)
Admission: RE | Disposition: A | Discharge status: Home / Self Care | Payer: Self-pay | Source: Home / Self Care | Attending: Internal Medicine

## 2020-09-02 DIAGNOSIS — I4892 Unspecified atrial flutter: Secondary | ICD-10-CM | POA: Insufficient documentation

## 2020-09-02 DIAGNOSIS — Z79890 Hormone replacement therapy (postmenopausal): Secondary | ICD-10-CM | POA: Insufficient documentation

## 2020-09-02 DIAGNOSIS — E119 Type 2 diabetes mellitus without complications: Secondary | ICD-10-CM | POA: Insufficient documentation

## 2020-09-02 DIAGNOSIS — Z8673 Personal history of transient ischemic attack (TIA), and cerebral infarction without residual deficits: Secondary | ICD-10-CM | POA: Insufficient documentation

## 2020-09-02 DIAGNOSIS — I484 Atypical atrial flutter: Secondary | ICD-10-CM | POA: Diagnosis not present

## 2020-09-02 DIAGNOSIS — I110 Hypertensive heart disease with heart failure: Secondary | ICD-10-CM | POA: Insufficient documentation

## 2020-09-02 DIAGNOSIS — Z87891 Personal history of nicotine dependence: Secondary | ICD-10-CM | POA: Diagnosis not present

## 2020-09-02 DIAGNOSIS — Z79899 Other long term (current) drug therapy: Secondary | ICD-10-CM | POA: Diagnosis not present

## 2020-09-02 DIAGNOSIS — Z803 Family history of malignant neoplasm of breast: Secondary | ICD-10-CM | POA: Diagnosis not present

## 2020-09-02 DIAGNOSIS — Z8249 Family history of ischemic heart disease and other diseases of the circulatory system: Secondary | ICD-10-CM | POA: Diagnosis not present

## 2020-09-02 DIAGNOSIS — Z7901 Long term (current) use of anticoagulants: Secondary | ICD-10-CM | POA: Insufficient documentation

## 2020-09-02 DIAGNOSIS — I5022 Chronic systolic (congestive) heart failure: Secondary | ICD-10-CM | POA: Insufficient documentation

## 2020-09-02 DIAGNOSIS — I272 Pulmonary hypertension, unspecified: Secondary | ICD-10-CM | POA: Insufficient documentation

## 2020-09-02 DIAGNOSIS — G4733 Obstructive sleep apnea (adult) (pediatric): Secondary | ICD-10-CM | POA: Diagnosis not present

## 2020-09-02 DIAGNOSIS — J449 Chronic obstructive pulmonary disease, unspecified: Secondary | ICD-10-CM | POA: Insufficient documentation

## 2020-09-02 DIAGNOSIS — I4891 Unspecified atrial fibrillation: Secondary | ICD-10-CM | POA: Insufficient documentation

## 2020-09-02 DIAGNOSIS — Z7951 Long term (current) use of inhaled steroids: Secondary | ICD-10-CM | POA: Diagnosis not present

## 2020-09-02 DIAGNOSIS — I351 Nonrheumatic aortic (valve) insufficiency: Secondary | ICD-10-CM | POA: Insufficient documentation

## 2020-09-02 HISTORY — PX: CARDIOVERSION: SHX1299

## 2020-09-02 SURGERY — CARDIOVERSION
Anesthesia: General

## 2020-09-02 MED ORDER — PHENYLEPHRINE 40 MCG/ML (10ML) SYRINGE FOR IV PUSH (FOR BLOOD PRESSURE SUPPORT)
PREFILLED_SYRINGE | INTRAVENOUS | Status: DC | PRN
  Administered 2020-09-02 (×2): 80 ug via INTRAVENOUS

## 2020-09-02 MED ORDER — LIDOCAINE 2% (20 MG/ML) 5 ML SYRINGE
INTRAMUSCULAR | Status: DC | PRN
  Administered 2020-09-02: 60 mg via INTRAVENOUS

## 2020-09-02 MED ORDER — AMIODARONE HCL 200 MG PO TABS: 200.0000 mg | ORAL_TABLET | Freq: Two times a day (BID) | ORAL | 0 refills | Status: DC

## 2020-09-02 MED ORDER — PROPOFOL 10 MG/ML IV BOLUS
INTRAVENOUS | Status: DC | PRN
  Administered 2020-09-02 (×4): 20 mg via INTRAVENOUS

## 2020-09-02 MED ORDER — SODIUM CHLORIDE 0.9 % IV SOLN: INTRAVENOUS | Status: DC | PRN

## 2020-09-02 NOTE — Anesthesia Postprocedure Evaluation (Signed)
Anesthesia Post Note  Patient: Donald [name]  Procedure(s) Performed: CARDIOVERSION     Patient location during evaluation: Endoscopy Anesthesia Type: General Level of consciousness: awake and alert Pain management: pain level controlled Vital Signs Assessment: post-procedure vital signs reviewed and stable Respiratory status: spontaneous breathing, nonlabored ventilation, respiratory function stable and patient connected to nasal cannula oxygen Cardiovascular status: blood pressure returned to baseline and stable Postop Assessment: no apparent nausea or vomiting Anesthetic complications: no   No notable events documented.  Last Vitals:  Vitals:   09/02/20 1052 09/02/20 1102  BP: (!) 126/95 134/87  Pulse: 70 68  Resp: 13 14  Temp:    SpO2: 97% 97%    Last Pain:  Vitals:   09/02/20 1102  TempSrc:   PainSc: 0-No pain                 Munachimso Rigdon L Vyolet Sakuma

## 2020-09-02 NOTE — Discharge Instructions (Signed)

## 2020-09-02 NOTE — CV Procedure (Signed)
Procedure: Electrical Cardioversion Indications:  Atrial Flutter  Procedure Details:  Consent: Risks of procedure as well as the alternatives and risks of each were explained to the (patient/caregiver).  Consent for procedure obtained.  Time Out: Verified patient identification, verified procedure, site/side was marked, verified correct patient position, special equipment/implants available, medications/allergies/relevent history reviewed, required imaging and test results available. PERFORMED.  Patient placed on cardiac monitor, pulse oximetry, supplemental oxygen as necessary.  Sedation given:  Propofol 80 mg per anesthesia Pacer pads placed anterior and posterior chest.  Cardioverted 1 time(s).  Cardioversion with synchronized biphasic 120J shock.  Evaluation: Findings: Post procedure EKG shows:  sinus bradycardia rate 58 Complications: None Patient did tolerate procedure well.  Time Spent Directly with the Patient:  30 minutes   Parke Poisson 09/02/2020, 10:43 AM

## 2020-09-02 NOTE — Anesthesia Preprocedure Evaluation (Addendum)
Anesthesia Evaluation  Patient identified by MRN, date of birth, ID band Patient awake    Reviewed: Allergy & Precautions, NPO status , Patient's Chart, lab work & pertinent test results  Airway Mallampati: III  TM Distance: >3 FB Neck ROM: Full    Dental no notable dental hx. (+) Teeth Intact, Dental Advisory Given, Missing,    Pulmonary sleep apnea (has not had sleep study yet) , COPD,  COPD inhaler, former smoker,  Quit smoking 2018   Pulmonary exam normal breath sounds clear to auscultation       Cardiovascular hypertension (139/93 in preop ), Pt. on medications +CHF (LVEF 20%)  Normal cardiovascular exam+ dysrhythmias (eliquis) Atrial Fibrillation + Valvular Problems/Murmurs (mild MR, mild AI) AI and MR  Rhythm:Irregular Rate:Normal  Echo 07/2020: 1. Left ventricular ejection fraction, by estimation, is 20%. The left  ventricle has severely decreased function. The left ventricle demonstrates  global hypokinesis. The left ventricular internal cavity size was  moderately dilated.  2. Right ventricular systolic function is severely reduced. The right  ventricular size is moderately enlarged.  3. Left atrial size was severely dilated. No left atrial/left atrial  appendage thrombus was detected.  4. Right atrial size was severely dilated.  5. The mitral valve is normal in structure. Mild mitral valve  regurgitation.  6. The aortic valve is tricuspid. Aortic valve regurgitation is mild.  7. Significan spontaneous echo contrast in descending aorta. There is  mild (Grade II) plaque.   Cath 07/2020: .  Mid RCA lesion is 60% stenosed. .  Prox RCA lesion is 30% stenosed. Marland Kitchen  RPDA lesion is 30% stenosed. .  1st Mrg lesion is 30% stenosed. .  Prox LAD to Mid LAD lesion is 20% stenosed. .  2nd Diag lesion is 40% stenosed. .  The left ventricular ejection fraction is less than 25% by visual estimate.  Findings:  Ao = 109/81  (93) LV = 102/9 RA =  7 RV = 46/4 PA = 49/17 (32) PCW = 16 (v = 25) Fick cardiac output/index = 5.5/2.3 PVR = 2.9 Ao sat = 97% PA sat = 67%,68%  Assessment: 1. Mild pulmonary HTN 2. Filling pressures improved 3. Low-normal cardiac output 4. Non-obstructive CAD 5. Severe NICM EF ~25%     Neuro/Psych CVA (remote hx, generalized weakness), Residual Symptoms negative psych ROS   GI/Hepatic negative GI ROS, Neg liver ROS,   Endo/Other  diabetes, Well Controlled, Type 2Obesity BMI 36 a1c 7.2  Renal/GU negative Renal ROS  negative genitourinary   Musculoskeletal negative musculoskeletal ROS (+)   Abdominal (+) + obese,   Peds  Hematology  (+) Blood dyscrasia, anemia ,   Anesthesia Other Findings   Reproductive/Obstetrics negative OB ROS                          Anesthesia Physical Anesthesia Plan  ASA: 4  Anesthesia Plan: General   Post-op Pain Management:    Induction: Intravenous  PONV Risk Score and Plan: TIVA and Treatment may vary due to age or medical condition  Airway Management Planned: Natural Airway and Mask  Additional Equipment: None  Intra-op Plan:   Post-operative Plan:   Informed Consent: I have reviewed the patients History and Physical, chart, labs and discussed the procedure including the risks, benefits and alternatives for the proposed anesthesia with the patient or authorized representative who has indicated his/her understanding and acceptance.     Dental advisory given  Plan Discussed with:  CRNA  Anesthesia Plan Comments:       Anesthesia Quick Evaluation

## 2020-09-02 NOTE — Interval H&P Note (Signed)
History and Physical Interval Note:  09/02/2020 10:18 AM  Donald [name]  has presented today for surgery, with the diagnosis of ATRIAL FLUTTER.  The various methods of treatment have been discussed with the patient and family. After consideration of risks, benefits and other options for treatment, the patient has consented to  Procedure(s): CARDIOVERSION (N/A) as a surgical intervention.  The patient's history has been reviewed, patient examined, no change in status, stable for surgery.  I have reviewed the patient's chart and labs.  Questions were answered to the patient's satisfaction.     Parke Poisson

## 2020-09-02 NOTE — Anesthesia Procedure Notes (Signed)
Procedure Name: General with mask airway Date/Time: 09/02/2020 10:32 AM Performed by: Lovie Chol, CRNA Pre-anesthesia Checklist: Patient identified, Emergency Drugs available, Suction available and Patient being monitored Patient Re-evaluated:Patient Re-evaluated prior to induction Oxygen Delivery Method: Ambu bag

## 2020-09-02 NOTE — Transfer of Care (Signed)
Immediate Anesthesia Transfer of Care Note  Patient: Donald [name]  Procedure(s) Performed: CARDIOVERSION  Patient Location: Endoscopy Unit  Anesthesia Type:General  Level of Consciousness: awake, oriented, drowsy and patient cooperative  Airway & Oxygen Therapy: Patient Spontanous Breathing and Patient connected to nasal cannula oxygen  Post-op Assessment: Report given to RN and Post -op Vital signs reviewed and stable  Post vital signs: Reviewed  Last Vitals:  Vitals Value Taken Time  BP    Temp    Pulse    Resp    SpO2      Last Pain:  Vitals:   09/02/20 0925  TempSrc: Oral  PainSc: 0-No pain         Complications: No notable events documented.

## 2020-09-03 ENCOUNTER — Telehealth: Payer: Self-pay

## 2020-09-03 NOTE — Telephone Encounter (Signed)
   Telephone encounter was:  Unsuccessful.  09/03/2020 Name: Donald [name] MRN: [id number] DOB: 09-02-1960  Unsuccessful outbound call made today to assist with:   list of pcp's.  Outreach Attempt:  2nd Attempt  Attempted to call patient regarding pcp list call would not connet.  Unable to leave a message.  Eliora Nienhuis, AAS Paralegal, [facility] Care Guide  Embedded Care Coordination [place]  Care Management  [address] [place], [place] [address] ??[email or web address]  ?? [phone]   [email or web address]

## 2020-09-04 ENCOUNTER — Telehealth: Payer: Self-pay

## 2020-09-04 ENCOUNTER — Encounter (HOSPITAL_COMMUNITY): Payer: Self-pay | Admitting: Internal Medicine

## 2020-09-04 NOTE — Telephone Encounter (Signed)
   Telephone encounter was:  Unsuccessful.  09/04/2020 Name: Donald [name] MRN: [id number] DOB: 1960/12/22  Unsuccessful outbound call made today to assist with:   PCP list.  Outreach Attempt:  3rd Attempt.  Referral closed unable to contact patient.  Attempted to call patient regarding pcp list call would not connect.  Unable to leave a message.  Xeng Kucher, AAS Paralegal, [facility] Care Guide  Embedded Care Coordination [place]  Care Management  [address] [place], [place] [address] ??[email or web address]  ?? [phone]   [email or web address]

## 2020-09-05 ENCOUNTER — Telehealth (HOSPITAL_COMMUNITY): Payer: Self-pay

## 2020-09-05 NOTE — Telephone Encounter (Signed)
Attempted to reach Mr. Miera without success. I will continue to reach out for paramedicine referral.

## 2020-09-06 ENCOUNTER — Other Ambulatory Visit: Payer: Self-pay

## 2020-09-06 ENCOUNTER — Ambulatory Visit: Payer: Medicaid Other | Attending: Internal Medicine | Admitting: Physical Therapy

## 2020-09-06 VITALS — BP 144/91 | HR 72

## 2020-09-06 DIAGNOSIS — R2689 Other abnormalities of gait and mobility: Secondary | ICD-10-CM | POA: Diagnosis present

## 2020-09-06 DIAGNOSIS — M6281 Muscle weakness (generalized): Secondary | ICD-10-CM | POA: Insufficient documentation
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
## 2020-09-10 ENCOUNTER — Telehealth (HOSPITAL_COMMUNITY): Payer: Self-pay

## 2020-09-10 ENCOUNTER — Other Ambulatory Visit (HOSPITAL_COMMUNITY): Payer: Self-pay | Admitting: *Deleted

## 2020-09-10 ENCOUNTER — Other Ambulatory Visit (HOSPITAL_COMMUNITY): Payer: Self-pay

## 2020-09-10 ENCOUNTER — Other Ambulatory Visit: Payer: Self-pay

## 2020-09-10 MED ORDER — SPIRONOLACTONE 25 MG PO TABS
12.5000 mg | ORAL_TABLET | Freq: Every day | ORAL | 3 refills | Status: DC
  Filled 2020-09-10: qty 15, 30d supply, fill #0

## 2020-09-10 MED ORDER — ATORVASTATIN CALCIUM 20 MG PO TABS
20.0000 mg | ORAL_TABLET | Freq: Every day | ORAL | 3 refills | Status: DC
  Filled 2020-09-10: qty 30, 30d supply, fill #0

## 2020-09-10 MED ORDER — SACUBITRIL-VALSARTAN 24-26 MG PO TABS
1.0000 | ORAL_TABLET | Freq: Two times a day (BID) | ORAL | 3 refills | Status: DC
  Filled 2020-09-10: qty 60, 30d supply, fill #0

## 2020-09-10 MED ORDER — DIGOXIN 125 MCG PO TABS
0.1250 mg | ORAL_TABLET | Freq: Every day | ORAL | 3 refills | Status: DC
  Filled 2020-09-10: qty 30, 30d supply, fill #0

## 2020-09-10 MED ORDER — APIXABAN 5 MG PO TABS
5.0000 mg | ORAL_TABLET | Freq: Two times a day (BID) | ORAL | 3 refills | Status: DC
  Filled 2020-09-10 (×10): qty 60, 30d supply, fill #0

## 2020-09-10 NOTE — Telephone Encounter (Signed)
Attempted to reach Donald [name] on his cell and home phone numbers without success. I will continue to reach out for paramedicine.

## 2020-09-11 ENCOUNTER — Telehealth (HOSPITAL_COMMUNITY): Payer: Self-pay

## 2020-09-11 ENCOUNTER — Other Ambulatory Visit: Payer: Self-pay

## 2020-09-11 NOTE — Telephone Encounter (Signed)
Donald [name] reached out to me after I have made several attempts. We agreed for home visit on Monday at 0900. Call complete.

## 2020-09-13 ENCOUNTER — Other Ambulatory Visit: Payer: Self-pay

## 2020-09-13 ENCOUNTER — Ambulatory Visit: Payer: Medicaid Other | Admitting: Physical Therapy

## 2020-09-13 DIAGNOSIS — M6281 Muscle weakness (generalized): Secondary | ICD-10-CM | POA: Diagnosis not present

## 2020-09-13 NOTE — Therapy (Signed)
[facility]. [place], [place], [address] Phone: [phone]   Fax:  [phone]  Physical Therapy Treatment  Patient Details  Name: Donald [name] MRN: [id number] Date of Birth: [birth year] Referring Provider (PT): Alma Friendly, MD   Encounter Date: 09/13/2020   PT End of Session - 09/13/20 0839     Visit Number 3    Date for PT Re-Evaluation 10/18/20    PT Start Time 0800    PT Stop Time 0850    PT Time Calculation (min) 50 min             Past Medical History:  Diagnosis Date   Arrhythmia    A fib    Atrial fibrillation (HCC)    CHF (congestive heart failure) ([place])    COPD (chronic obstructive pulmonary disease) ([place])    Hypertension    Sleep apnea    Stroke [facility])     Past Surgical History:  Procedure Laterality Date   ABLATION     CARDIOVERSION N/A 03/12/2020   Procedure: CARDIOVERSION;  Surgeon: Skeet Latch, MD;  Location: [place];  Service: Cardiovascular;  Laterality: N/A;   CARDIOVERSION N/A 08/13/2020   Procedure: CARDIOVERSION;  Surgeon: Jolaine Artist, MD;  Location: [place];  Service: Cardiovascular;  Laterality: N/A;   CARDIOVERSION N/A 09/02/2020   Procedure: CARDIOVERSION;  Surgeon: Elouise Munroe, MD;  Location: [place];  Service: Cardiovascular;  Laterality: N/A;   INCISION AND DRAINAGE PERIRECTAL ABSCESS N/A 10/20/2012   Procedure: IRRIGATION AND DEBRIDEMENT PERIRECTAL ABSCESS;  Surgeon: Imogene Burn. Georgette Dover, MD;  Location: [place];  Service: General;  Laterality: N/A;  Lithotomy   RIGHT/LEFT HEART CATH AND CORONARY ANGIOGRAPHY N/A 08/12/2020   Procedure: RIGHT/LEFT HEART CATH AND CORONARY ANGIOGRAPHY;  Surgeon: Jolaine Artist, MD;  Location: [place] CV LAB;  Service: Cardiovascular;  Laterality: N/A;   TEE WITHOUT CARDIOVERSION N/A 03/12/2020   Procedure: TRANSESOPHAGEAL ECHOCARDIOGRAM (TEE);  Surgeon: Skeet Latch, MD;  Location: [place];   Service: Cardiovascular;  Laterality: N/A;   TEE WITHOUT CARDIOVERSION N/A 08/13/2020   Procedure: TRANSESOPHAGEAL ECHOCARDIOGRAM (TEE);  Surgeon: Jolaine Artist, MD;  Location: [facility] ENDOSCOPY;  Service: Cardiovascular;  Laterality: N/A;    There were no vitals filed for this visit.   Subjective Assessment - 09/13/20 0800     Subjective being more actvie and at gym 2 x since i left    Currently in Pain? No/denies                               [facility] Adult PT Treatment/Exercise - 09/13/20 0001       Ambulation/Gait   Gait Comments 2MWT 300 plus feet no AD ,slow pace but neg curbs and ramps without issue      Lumbar Exercises: Aerobic   UBE (Upper Arm Bike) L 3 3 min fwd/3 min backward    Recumbent Bike L 5 15mn      Lumbar Exercises: Machines for Strengthening   Cybex Knee Extension 15# 2 sets 10    Cybex Knee Flexion 25# 2 sets 10      Lumbar Exercises: Standing   Shoulder Extension Power Tower;Both;20 reps   10# pulleys BIL   Other Standing Lumbar Exercises cable pulley chest press in squat 2 sets 10      Lumbar Exercises: Seated   Sit to Stand 5 reps   2 x under 10  sec both times     Knee/Hip Exercises: Standing   Walking with Sports Cord 40# 5 x fwd,5x backward, 3 x each side    Other Standing Knee Exercises squat row with cable pulleys 10# each 2 sets 10                      PT Short Term Goals - 09/06/20 0804       PT SHORT TERM GOAL #1   Title Independent with initial HEP    Status Achieved               PT Long Term Goals - 09/13/20 0759       PT LONG TERM GOAL #1   Title Independent with advanced HEP including gym exercises if appropriate    Status Partially Met      PT LONG TERM GOAL #2   Title Pt will complete 5TSTS in </= 10 seconds to demonstrate improved balance and functional strength    Status Achieved      PT LONG TERM GOAL #3   Title Improve 2MWT to at least 190 m with RPE </= 4/10 and good VSS to  demonstrate improved aerobic capacity    Baseline 2MWT 300 feet PRE 5-6/10    Status Partially Met      PT LONG TERM GOAL #4   Title improved strength to grossly 4+/5 to 5/5 bilaterally.    Status Partially Met                   Plan - 09/13/20 0839     Clinical Impression Statement progressing with LTG. STS goal met. 2MWT met for distance but PRE was 5-6. pt tolerated all ex well but did get fatigued and SOB requiring rest. cuing needed for body mechanics and speed/control of exercises.    PT Treatment/Interventions ADLs/Self Care Home Management;Gait training;Functional mobility training;Therapeutic activities;Therapeutic exercise;Balance training;Neuromuscular re-education;Manual techniques;Energy conservation;Patient/family education    PT Next Visit Plan assess how gym is going and progress as tolerated             Patient will benefit from skilled therapeutic intervention in order to improve the following deficits and impairments:  Abnormal gait, Obesity, Improper body mechanics, Impaired flexibility, Decreased mobility, Decreased strength, Decreased balance, Cardiopulmonary status limiting activity, Decreased endurance  Visit Diagnosis: Muscle weakness (generalized)     Problem List Patient Active Problem List   Diagnosis Date Noted   Atypical atrial flutter ([place]) 08/29/2020   Secondary hypercoagulable state ([place]) 08/29/2020   CAP (community acquired pneumonia) 08/06/2020   OSA on CPAP 08/06/2020   Leukocytosis 08/06/2020   Acute pulmonary embolism ([place]) 08/06/2020   Atrial fibrillation with RVR ([place]) 03/08/2020   Pulmonary embolism ([place]) 03/08/2020   Essential hypertension 03/08/2020   COPD (chronic obstructive pulmonary disease) ([place]) 03/08/2020   Anemia 03/08/2020   COPD exacerbation ([place]) 11/16/2014   Atypical chest pain 11/16/2014   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 [place], [place], [address] Phone: [phone]   Fax:  [phone]  Name: Donald [name] MRN: [id number] Date of Birth: [birth year]

## 2020-09-16 ENCOUNTER — Other Ambulatory Visit: Payer: Self-pay

## 2020-09-16 ENCOUNTER — Telehealth (HOSPITAL_COMMUNITY): Payer: Self-pay | Admitting: *Deleted

## 2020-09-16 ENCOUNTER — Other Ambulatory Visit (HOSPITAL_COMMUNITY): Payer: Self-pay

## 2020-09-16 MED ORDER — TORSEMIDE 20 MG PO TABS
40.0000 mg | ORAL_TABLET | Freq: Two times a day (BID) | ORAL | 6 refills | Status: DC
  Filled 2020-09-16: qty 120, 30d supply, fill #0

## 2020-09-16 MED ORDER — SPIRONOLACTONE 25 MG PO TABS
25.0000 mg | ORAL_TABLET | Freq: Every day | ORAL | 3 refills | Status: DC
  Filled 2020-09-16: qty 30, 30d supply, fill #0

## 2020-09-16 NOTE — Progress Notes (Signed)
Paramedicine Encounter    Patient ID: Donald [name], male    DOB: Aug 01, 1960, 60 y.o.   MRN: [id number]   Patient Care Team: Lucianne Lei, MD as PCP - General (Family Medicine) Craft, Lorel Monaco, RN as Case Manager  Patient Active Problem List   Diagnosis Date Noted   Atypical atrial flutter ([place]) 08/29/2020   Secondary hypercoagulable state ([place]) 08/29/2020   CAP (community acquired pneumonia) 08/06/2020   OSA on CPAP 08/06/2020   Leukocytosis 08/06/2020   Acute pulmonary embolism ([place]) 08/06/2020   Atrial fibrillation with RVR ([place]) 03/08/2020   Pulmonary embolism ([place]) 03/08/2020   Essential hypertension 03/08/2020   COPD (chronic obstructive pulmonary disease) ([place]) 03/08/2020   Anemia 03/08/2020   COPD exacerbation ([place]) 11/16/2014   Atypical chest pain 11/16/2014   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   Paroxysmal atrial fibrillation ([place]) 11/16/2014   Acute bronchitis 11/16/2014   Abnormal EKG 11/16/2014   Type 2 diabetes mellitus without complication ([place]) 36/62/9476   Perirectal abscess 10/20/2012    Current Outpatient Medications:    Accu-Chek Softclix Lancets lancets, Use as directed, Disp: 100 each, Rfl: 0   albuterol (PROVENTIL HFA;VENTOLIN HFA) 108 (90 Base) MCG/ACT inhaler, Inhale 2 puffs into the lungs every 4 (four) hours as needed for shortness of breath., Disp: , Rfl:    albuterol (PROVENTIL) (2.5 MG/3ML) 0.083% nebulizer solution, Take 3 mLs (2.5 mg total) by nebulization every 4 (four) hours as needed for wheezing or shortness of breath., Disp: 30 vial, Rfl: 0   amiodarone (PACERONE) 200 MG tablet, Take 1 tablet (200 mg total) by mouth 2 (two) times daily., Disp: 60 tablet, Rfl: 0   apixaban (ELIQUIS) 5 MG TABS tablet, Take 1 tablet (5 mg total) by mouth 2 (two) times daily., Disp: 60 tablet, Rfl: 3   atorvastatin (LIPITOR) 20 MG tablet, Take 1 tablet (20 mg total) by mouth daily., Disp: 30 tablet, Rfl: 3   Blood Glucose Monitoring Suppl (BLOOD GLUCOSE  MONITOR SYSTEM) w/Device KIT, USE AS DIRECTED, Disp: 1 kit, Rfl: 0   budesonide-formoterol (SYMBICORT) 80-4.5 MCG/ACT inhaler, Inhale 2 puffs into the lungs in the morning and at bedtime., Disp: 10.2 g, Rfl: 0   digoxin (LANOXIN) 0.125 MG tablet, Take 1 tablet (0.125 mg total) by mouth daily., Disp: 30 tablet, Rfl: 3   glucose blood test strip, Use as directed, Disp: 100 each, Rfl: 0   mometasone-formoterol (DULERA) 100-5 MCG/ACT AERO, Inhale 1 puff into the lungs daily., Disp: , Rfl:    sacubitril-valsartan (ENTRESTO) 24-26 MG, Take 1 tablet by mouth 2 (two) times daily., Disp: 60 tablet, Rfl: 3   spironolactone (ALDACTONE) 25 MG tablet, Take 0.5 tablets (12.5 mg total) by mouth daily., Disp: 15 tablet, Rfl: 3   torsemide (DEMADEX) 20 MG tablet, Take 2 tablets (40 mg total) by mouth 2 (two) times daily., Disp: 120 tablet, Rfl: 0 No Active Allergies   Social History   Socioeconomic History   Marital status: Single    Spouse name: Not on file   Number of children: Not on file   Years of education: Not on file   Highest education level: Not on file  Occupational History   Not on file  Tobacco Use   Smoking status: Former    Packs/day: 0.50    Types: Cigarettes    Quit date: 2018    Years since quitting: 4.6   Smokeless tobacco: Never  Substance and Sexual Activity   Alcohol use: Not Currently   Drug  use: Not Currently    Types: Cocaine   Sexual activity: Not Currently  Other Topics Concern   Not on file  Social History Narrative   Not on file   Social Determinants of Health   Financial Resource Strain: Medium Risk   Difficulty of Paying Living Expenses: Somewhat hard  Food Insecurity: Food Insecurity Present   Worried About Running Out of Food in the Last Year: Never true   Ran Out of Food in the Last Year: Sometimes true  Transportation Needs: Unmet Transportation Needs   Lack of Transportation (Medical): Yes   Lack of Transportation (Non-Medical): Yes  Physical  Activity: Inactive   Days of Exercise per Week: 0 days   Minutes of Exercise per Session: 0 min  Stress: Not on file  Social Connections: Not on file  Intimate Partner Violence: Not on file    Physical Exam Vitals reviewed.  Constitutional:      Appearance: Normal appearance. He is normal weight.  HENT:     Head: Normocephalic.     Nose: Nose normal.     Mouth/Throat:     Mouth: Mucous membranes are moist.     Pharynx: Oropharynx is clear.  Eyes:     Conjunctiva/sclera: Conjunctivae normal.     Pupils: Pupils are equal, round, and reactive to light.  Cardiovascular:     Rate and Rhythm: Normal rate and regular rhythm.     Pulses: Normal pulses.     Heart sounds: Normal heart sounds.  Pulmonary:     Effort: Pulmonary effort is normal.     Breath sounds: Normal breath sounds.  Abdominal:     General: Abdomen is flat.     Palpations: Abdomen is soft.  Musculoskeletal:        General: No swelling. Normal range of motion.     Cervical back: Normal range of motion.     Right lower leg: No edema.     Left lower leg: No edema.  Skin:    General: Skin is warm.     Capillary Refill: Capillary refill takes less than 2 seconds.  Neurological:     General: No focal deficit present.     Mental Status: He is alert. Mental status is at baseline.  Psychiatric:        Mood and Affect: Mood normal.    Arrived for initial home visit with Donald [name] who reports to be feeling good with no complaints. He denied chest pain, shortness of breath, dizziness or trouble taking his medications. Donald [name] currently fills his own pill box with two weeks within one box. First row and third row (one week) Middle row and last row (second week). I reviewed this and filled two weeks worth appropriately.   Donald [name] requesting bubble packs, I suggested [facility] and he agreed with plan. I will pick up medications and [facility] for [facility] starting in the next week or two.    Vitals and assessment as noted.   Donald [name] has appointment scheduled for HF clinic on 9.7 at 12:00. I will meet him there for same.   Donald [name] is interested in exercise program. I will refer him to [facility] for PREP eval.   Home visit complete I will see him in two weeks.   Refills: Torsemide Arlyce Harman Digoxin       Future Appointments  Date Time Provider [place]  09/20/2020  8:00 AM Payseur, Dionne Bucy, PTA [facility]  09/23/2020  3:30 PM [facility]-MM CARE MANAGER 2 [facility] None  09/24/2020 10:15 AM Vickie Epley, MD [facility]  09/26/2020  9:30 AM [facility]MM PHARMACIST [facility] None  10/14/2020  2:00 PM Fredia Sorrow, RD [place] NDM     ACTION: Home visit completed

## 2020-09-16 NOTE — Telephone Encounter (Signed)
Donald [name], community paramedic is out seeing pt today and had some discrepencies in meds: Amio, Spiro,and Tor and she wanted to verify what dose pt should be taking.   Per review of chart:   Amio 200 mg BID, decreased after dccv 8/8  Torsemide 40 mg BID  Spiro 25 mg Daily, increased at OV 7/28  Med list updated to refect these doses and Herbert Seta will adjust pill box accordingly  F/u appt scheduled

## 2020-09-18 ENCOUNTER — Other Ambulatory Visit: Payer: Self-pay

## 2020-09-20 ENCOUNTER — Other Ambulatory Visit: Payer: Self-pay

## 2020-09-20 ENCOUNTER — Encounter: Payer: Self-pay | Admitting: Rehabilitative and Restorative Service Providers"

## 2020-09-20 ENCOUNTER — Ambulatory Visit: Payer: Medicaid Other | Admitting: Rehabilitative and Restorative Service Providers"

## 2020-09-20 DIAGNOSIS — M6281 Muscle weakness (generalized): Secondary | ICD-10-CM | POA: Diagnosis not present

## 2020-09-20 DIAGNOSIS — R2689 Other abnormalities of gait and mobility: Secondary | ICD-10-CM

## 2020-09-20 NOTE — Therapy (Signed)
[address] [place], [place], [address] Phone: [phone]   Fax:  [phone]  Physical Therapy Treatment  Patient Details  Name: Donald [name] MRN: [id number] Date of Birth: 03/18/1960 Referring Provider (PT): Donald Friendly, MD   Encounter Date: 09/20/2020   PT End of Session - 09/20/20 0804     Visit Number 4    Date for PT Re-Evaluation 10/18/20    PT Start Time 0800    PT Stop Time 0845    PT Time Calculation (min) 45 min    Activity Tolerance Patient tolerated treatment well    Behavior During Therapy [name] for tasks assessed/performed             Past Medical History:  Diagnosis Date   Arrhythmia    A fib    Atrial fibrillation (HCC)    CHF (congestive heart failure) ([place])    COPD (chronic obstructive pulmonary disease) ([place])    Hypertension    Sleep apnea    Stroke Donald [name])     Past Surgical History:  Procedure Laterality Date   ABLATION     CARDIOVERSION N/A 03/12/2020   Procedure: CARDIOVERSION;  Surgeon: Skeet Latch, MD;  Location: [place];  Service: Cardiovascular;  Laterality: N/A;   CARDIOVERSION N/A 08/13/2020   Procedure: CARDIOVERSION;  Surgeon: Jolaine Artist, MD;  Location: [place];  Service: Cardiovascular;  Laterality: N/A;   CARDIOVERSION N/A 09/02/2020   Procedure: CARDIOVERSION;  Surgeon: Elouise Munroe, MD;  Location: [place];  Service: Cardiovascular;  Laterality: N/A;   INCISION AND DRAINAGE PERIRECTAL ABSCESS N/A 10/20/2012   Procedure: IRRIGATION AND DEBRIDEMENT PERIRECTAL ABSCESS;  Surgeon: Imogene Burn. Georgette Dover, MD;  Location: [place];  Service: General;  Laterality: N/A;  Lithotomy   RIGHT/LEFT HEART CATH AND CORONARY ANGIOGRAPHY N/A 08/12/2020   Procedure: RIGHT/LEFT HEART CATH AND CORONARY ANGIOGRAPHY;  Surgeon: Jolaine Artist, MD;  Location: [place] [facility];  Service: Cardiovascular;  Laterality: N/A;   TEE WITHOUT CARDIOVERSION N/A  03/12/2020   Procedure: TRANSESOPHAGEAL ECHOCARDIOGRAM (TEE);  Surgeon: Skeet Latch, MD;  Location: [place];  Service: Cardiovascular;  Laterality: N/A;   TEE WITHOUT CARDIOVERSION N/A 08/13/2020   Procedure: TRANSESOPHAGEAL ECHOCARDIOGRAM (TEE);  Surgeon: Jolaine Artist, MD;  Location: [name];  Service: Cardiovascular;  Laterality: N/A;    There were no vitals filed for this visit.   Subjective Assessment - 09/20/20 0804     Subjective I am going to go to the [facility] in [facility] to work out this weekend.    Patient Stated Goals get stronger, more endurance.    Currently in Pain? No/denies                               [facility] Adult PT Treatment/Exercise - 09/20/20 0001       Ambulation/Gait   Gait Comments Ambulation outside for 6 min around back parking lot loop for 1/4 mile.      Lumbar Exercises: Aerobic   UBE (Upper Arm Bike) L 3 3 min fwd/3 min backward      Lumbar Exercises: Machines for Strengthening   Cybex Knee Extension 20# 2x10    Cybex Knee Flexion 35# 2 sets 10    Other Lumbar Machine Exercise lats and rows 35# 2 sets 10      Lumbar Exercises: Standing   Shoulder Extension Power Tower;Both;20 reps   15#  Knee/Hip Exercises: Standing   Walking with Sports Cord 40# 4 way x4 each      Knee/Hip Exercises: Seated   Sit to Sand 2 sets;10 reps;without UE support   x10 with blue wt ball CP, x10 OHP                     PT Short Term Goals - 09/06/20 0804       PT SHORT TERM GOAL #1   Title Independent with initial HEP    Status Achieved               PT Long Term Goals - 09/20/20 0839       PT LONG TERM GOAL #1   Title Independent with advanced HEP including gym exercises if appropriate    Status Partially Met      PT LONG TERM GOAL #3   Title Improve 2MWT to at least 190 m with RPE </= 4/10 and good VSS to demonstrate improved aerobic capacity    Status Partially Met      PT LONG TERM GOAL #4    Title improved strength to grossly 4+/5 to 5/5 bilaterally.    Status Partially Met      PT LONG TERM GOAL #5   Title DGI imporved to at least 21/24 to demonstrate improved dynamic balance with ambulation    Status On-going                   Plan - 09/20/20 0832     Clinical Impression Statement [name] continues to make progress towards goal related activities.  He was able to increase weights on weight equipment.  He only requires brief recovery periods between exercises and states that he is feeling stronger.  He requires minimal cuing for body mechanics and improved posture during exercises.  Pt continues to require skilled PT to progress him towards goal related activities.    PT Treatment/Interventions ADLs/Self Care Home Management;Gait training;Functional mobility training;Therapeutic activities;Therapeutic exercise;Balance training;Neuromuscular re-education;Manual techniques;Energy conservation;Patient/family education    PT Next Visit Plan assess how gym is going and progress as tolerated    Consulted and Agree with Plan of Care Patient             Patient will benefit from skilled therapeutic intervention in order to improve the following deficits and impairments:  Abnormal gait, Obesity, Improper body mechanics, Impaired flexibility, Decreased mobility, Decreased strength, Decreased balance, Cardiopulmonary status limiting activity, Decreased endurance  Visit Diagnosis: Muscle weakness (generalized)  Other abnormalities of gait and mobility     Problem List Patient Active Problem List   Diagnosis Date Noted   Atypical atrial flutter ([place]) 08/29/2020   Secondary hypercoagulable state ([place]) 08/29/2020   CAP (community acquired pneumonia) 08/06/2020   OSA on CPAP 08/06/2020   Leukocytosis 08/06/2020   Acute pulmonary embolism ([place]) 08/06/2020   Atrial fibrillation with RVR ([place]) 03/08/2020   Pulmonary embolism ([place]) 03/08/2020   Essential  hypertension 03/08/2020   COPD (chronic obstructive pulmonary disease) ([place]) 03/08/2020   Anemia 03/08/2020   COPD exacerbation ([place]) 11/16/2014   Atypical chest pain 11/16/2014   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   Paroxysmal atrial fibrillation ([place]) 11/16/2014   Acute bronchitis 11/16/2014   Abnormal EKG 11/16/2014   Type 2 diabetes mellitus without complication ([place]) 77/41/2878   Perirectal abscess 10/20/2012    Juel Burrow, PT, DPT 09/20/2020, 9:05 AM  [address]  [place], [place], [address] Phone: [phone]   Fax:  [phone]  Name: Donald [name] MRN: [id number] Date of Birth: Dec 04, 1960

## 2020-09-23 ENCOUNTER — Other Ambulatory Visit: Payer: Self-pay

## 2020-09-23 ENCOUNTER — Telehealth (HOSPITAL_COMMUNITY): Payer: Self-pay

## 2020-09-23 ENCOUNTER — Other Ambulatory Visit: Payer: Self-pay | Admitting: Obstetrics and Gynecology

## 2020-09-23 NOTE — Patient Instructions (Signed)
Hi Donald [name], thank you for speaking with me today. Donald [name] was given information about Medicaid Managed Care team care coordination services as a part of their [facility] Community Plan Medicaid benefit. Donald [name] verbally consented to engagement with the [facility] Managed Care team.   If you are experiencing a medical emergency, please call 911 or report to your local emergency department or urgent care.   If you have a non-emergency medical problem during routine business hours, please contact your provider's office and ask to speak with a nurse.   For questions related to your [facility], please call: [phone] or visit the homepage here: [email or web address]  If you would like to schedule transportation through your [facility], please call the following number at least 2 days in advance of your appointment: [phone].   Call the Behavioral Health Crisis Line at [phone], at any time, 24 hours a day, 7 days a week. If you are in danger or need immediate medical attention call 911.  If you would like help to quit smoking, call 1-800-QUIT-NOW (5645081995) OR Espaol: 1-855-Djelo-Ya (7-001-749-4496) o para ms informacin haga clic aqu or Text READY to 759-163 to register via text  Donald [name] - following are the goals we discussed in your visit today:   Goals Addressed             This Visit's Progress    Protect My Health       Timeframe:  Long-Range Goal Priority:  High Start Date:       08/23/20                      Expected End Date:  ongoing                     Follow Up Date: 10/24/20   - schedule appointment for flu shot - schedule appointment for vaccines needed due to my age or health - schedule recommended health tests  - schedule and keep appointment for annual check-up    Why is this important?   Screening tests can find diseases early  when they are easier to treat.  Your doctor or nurse will talk with you about which tests are important for you.  Getting shots for common diseases like the flu and shingles will help prevent them.     Notes: Update 09/23/20:  Patient keeping all appointments.    The patient verbalized understanding of instructions provided today and declined a print copy of patient instruction materials.   The Managed Medicaid care management team will reach out to the patient again over the next 30 days.  The  Patient                                              has been provided with contact information for the Managed Medicaid care management team and has been advised to call with any health related questions or concerns.   Kathi Der RN, BSN [employer], production - Managed Medicaid High Risk [phone].   Following is a copy of your plan of care:  Patient Care Plan: General Plan of Care (Adult)     Problem Identified: Health Promotion or Disease Self-Management (General Plan of Care)   Priority: High  Onset Date: 08/23/2020  Long-Range Goal: Self-Management Plan Developed   Start Date: 08/23/2020  Expected End Date: 11/23/2020  Recent Progress: Not on track  Priority: High  Note:   Current Barriers:  Ineffective Self Health Maintenance Currently UNABLE TO independently self manage needs related to chronic health conditions.  Knowledge Deficits related to short term plan for care coordination needs and long term plans for chronic disease management needs related to HTN, COPD, Afib, HF, DM2, OSA Nurse Case Manager Clinical Goal(s):  patient will work with care management team to address care coordination and chronic disease management needs related to Disease Management Educational Needs Care Coordination Medication Management and Education Medication Reconciliation Medication Assistance    Interventions:  Evaluation of current treatment  plan and patient's adherence to plan as established by provider. Reviewed medications with patient Collaborated with pharmacy regarding medications Discussed plans with patient for ongoing care management follow up and provided patient with direct contact information for care management team Advised patient, providing education and rationale, to monitor blood pressure daily and record, calling provider for findings outside established parameters.  Reviewed scheduled/upcoming provider appointments. Advised patient, providing education and rationale, to check cbg  and record, calling provider  for findings outside established parameters.   Advised patient, providing education and rationale, to weigh daily and record, calling provider for weight gain of 3lbs overnight or 5 pounds in a week.  Care Guide referral for new PCP. Update 09/23/20:  Care guide tried to call patient three times, was unsuccessful in reaching patient.  Patient given phone number to call and follow up. Pharmacy referral for medication review. Update 09/23/20:  Patient has an appointment with Pharmacist 09/26/20. Self Care Activities:  Patient will self administer medications as prescribed Patient will attend all scheduled provider appointments Patient will call pharmacy for medication refills Patient will continue to perform ADL's independently Patient will continue to perform IADL's independently Patient will call provider office for new concerns or questions Patient Goals: In the next 30 days, patient will attend all appointments. In the next 30 days, patient will speak with Pharmacist. In the next 30 days, patient will find new PCP. - Follow Up Plan: The patient has been provided with contact information for the care management team and has been advised to call with any health related questions or concerns.  The care management team will reach out to the patient again over the next 30 days.    Evidence-based guidance:    Review biopsychosocial determinants of health screens.  Review need for preventive screening based on age, sex, family history and health history.  Determine level of modifiable health risk.  Discuss identified risks.  Identify areas where behavior change may lead to improved health.  Promote healthy lifestyle.  Evoke change talk using open-ended questions, pros and cons, as well as looking forward.  Identify and manage conditions or preconditions to reduce health risk.  Implement additional goals and interventions based on identified risk factors.

## 2020-09-23 NOTE — Telephone Encounter (Signed)
Left message for Donald [name] for home visit today, no answer. I will continue to reach out.

## 2020-09-23 NOTE — Patient Outreach (Signed)
Medicaid Managed Care   Nurse Care Manager Note  09/23/2020 Name:  Donald [name] MRN:  [id number] DOB:  November 28, 1960  Donald [name] is an 60 y.o. year old male who is a primary patient of Lucianne Lei, MD.  The [facility] Managed Care Coordination team was consulted for assistance with:    Chronic healthcare management needs.  Donald [name] was given information about Medicaid Managed Care Coordination team services today. Donald [name] Patient agreed to services and verbal consent obtained.  Engaged with patient by telephone for follow up visit in response to provider referral for case management and/or care coordination services.   Assessments/Interventions:  Review of past medical history, allergies, medications, health status, including review of consultants reports, laboratory and other test data, was performed as part of comprehensive evaluation and provision of chronic care management services.  SDOH (Social Determinants of Health) assessments and interventions performed: SDOH Interventions    Flowsheet Row Most Recent Value  SDOH Interventions   Intimate Partner Violence Interventions Intervention Not Indicated  Social Connections Interventions Intervention Not Indicated       Care Plan  No Active Allergies  Medications Reviewed Today     Reviewed by Gayla Medicus, RN (Registered Nurse) on 09/23/20 at 38  Med List Status: <None>   Medication Order Taking? Sig Documenting Provider Last Dose Status Informant  Accu-Chek Softclix Lancets lancets [id number] No Use as directed Alma Friendly, MD Taking Active Self  albuterol (PROVENTIL HFA;VENTOLIN HFA) 108 (90 Base) MCG/ACT inhaler [id number] No Inhale 2 puffs into the lungs every 4 (four) hours as needed for shortness of breath. [provider] Taking Active Self  albuterol (PROVENTIL) (2.5 MG/3ML) 0.083% nebulizer solution [id number] No Take 3 mLs (2.5 mg total) by nebulization every 4 (four) hours as needed for  wheezing or shortness of breath. Malvin Johns, MD Taking Active Self  amiodarone (PACERONE) 200 MG tablet [id number] No Take 1 tablet (200 mg total) by mouth 2 (two) times daily. Elouise Munroe, MD Taking Active   apixaban (ELIQUIS) 5 MG TABS tablet [id number] No Take 1 tablet (5 mg total) by mouth 2 (two) times daily. Clegg, Amy D, NP Taking Active   atorvastatin (LIPITOR) 20 MG tablet [id number] No Take 1 tablet (20 mg total) by mouth daily. Darrick Grinder D, NP Taking Active   Blood Glucose Monitoring Suppl (BLOOD GLUCOSE MONITOR SYSTEM) w/Device KIT [id number] No USE AS DIRECTED Alma Friendly, MD Taking Active Self  budesonide-formoterol [facility]) 80-4.5 MCG/ACT inhaler [id number] No Inhale 2 puffs into the lungs in the morning and at bedtime. Alma Friendly, MD Taking Active Self  digoxin (LANOXIN) 0.125 MG tablet [id number] No Take 1 tablet (0.125 mg total) by mouth daily. Darrick Grinder D, NP Taking Active   glucose blood test strip [id number] No Use as directed Alma Friendly, MD Taking Active Self  mometasone-formoterol (DULERA) 100-5 MCG/ACT AERO [id number] No Inhale 1 puff into the lungs daily. [provider] Taking Active Self  sacubitril-valsartan (ENTRESTO) 24-26 MG [id number] No Take 1 tablet by mouth 2 (two) times daily. Clegg, Amy D, NP Taking Active   spironolactone (ALDACTONE) 25 MG tablet [id number] No Take 1 tablet (25 mg total) by mouth daily. Darrick Grinder D, NP Taking Active   torsemide (DEMADEX) 20 MG tablet [id number] No Take 2 tablets (40 mg total) by mouth 2 (two) times daily. Conrad [place], NP Taking Active             Patient Active Problem  List   Diagnosis Date Noted   Atypical atrial flutter ([place]) 08/29/2020   Secondary hypercoagulable state ([place]) 08/29/2020   CAP (community acquired pneumonia) 08/06/2020   OSA on CPAP 08/06/2020   Leukocytosis 08/06/2020   Acute pulmonary embolism ([place]) 08/06/2020   Atrial fibrillation with RVR ([place])  03/08/2020   Pulmonary embolism ([place]) 03/08/2020   Essential hypertension 03/08/2020   COPD (chronic obstructive pulmonary disease) ([place]) 03/08/2020   Anemia 03/08/2020   COPD exacerbation ([place]) 11/16/2014   Atypical chest pain 11/16/2014   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   Paroxysmal atrial fibrillation ([place]) 11/16/2014   Acute bronchitis 11/16/2014   Abnormal EKG 11/16/2014   Type 2 diabetes mellitus without complication ([place]) 07/26/1599   Perirectal abscess 10/20/2012    Conditions to be addressed/monitored per PCP order:   chronic healthcare management needs, HTN, COPD, Afib, HF, DM2, OSD.  Care Plan : General Plan of Care (Adult)  Updates made by Gayla Medicus, RN since 09/23/2020 12:00 AM     Problem: Health Promotion or Disease Self-Management (General Plan of Care)   Priority: High  Onset Date: 08/23/2020     Long-Range Goal: Self-Management Plan Developed   Start Date: 08/23/2020  Expected End Date: 11/23/2020  Recent Progress: Not on track  Priority: High  Note:   Current Barriers:  Ineffective Self Health Maintenance Currently UNABLE TO independently self manage needs related to chronic health conditions.  Knowledge Deficits related to short term plan for care coordination needs and long term plans for chronic disease management needs related to HTN, COPD, Afib, HF, DM2, OSA Nurse Case Manager Clinical Goal(s):  patient will work with care management team to address care coordination and chronic disease management needs related to Disease Management Educational Needs Care Coordination Medication Management and Education Medication Reconciliation Medication Assistance    Interventions:  Evaluation of current treatment plan and patient's adherence to plan as established by provider. Reviewed medications with patient Collaborated with pharmacy regarding medications Discussed plans with patient for ongoing care management follow up and provided patient  with direct contact information for care management team Advised patient, providing education and rationale, to monitor blood pressure daily and record, calling provider for findings outside established parameters.  Reviewed scheduled/upcoming provider appointments. Advised patient, providing education and rationale, to check cbg  and record, calling provider  for findings outside established parameters.   Advised patient, providing education and rationale, to weigh daily and record, calling provider for weight gain of 3lbs overnight or 5 pounds in a week.  Care Guide referral for new PCP. Update 09/23/20:  Care guide tried to call patient three times, was unsuccessful in reaching patient.  Patient given phone number to call and follow up. Pharmacy referral for medication review. Update 09/23/20:  Patient has an appointment with Pharmacist 09/26/20. Self Care Activities:  Patient will self administer medications as prescribed Patient will attend all scheduled provider appointments Patient will call pharmacy for medication refills Patient will continue to perform ADL's independently Patient will continue to perform IADL's independently Patient will call provider office for new concerns or questions Patient Goals: In the next 30 days, patient will attend all appointments. In the next 30 days, patient will speak with Pharmacist. In the next 30 days, patient will find new PCP. - Follow Up Plan: The patient has been provided with contact information for the care management team and has been advised to call with any health related questions or concerns.  The care management team will reach  out to the patient again over the next 30 days.    Evidence-based guidance:   Review biopsychosocial determinants of health screens.  Review need for preventive screening based on age, sex, family history and health history.  Determine level of modifiable health risk.  Discuss identified risks.  Identify areas  where behavior change may lead to improved health.  Promote healthy lifestyle.  Evoke change talk using open-ended questions, pros and cons, as well as looking forward.  Identify and manage conditions or preconditions to reduce health risk.  Implement additional goals and interventions based on identified risk factors.      Follow Up:  Patient agrees to Care Plan and Follow-up.  Plan: The Managed Medicaid care management team will reach out to the patient again over the next 30 days. and The patient has been provided with contact information for the Managed Medicaid care management team and has been advised to call with any health related questions or concerns.  Date/time of next scheduled RN care management/care coordination outreach:  10/24/20 at 1030.

## 2020-09-24 ENCOUNTER — Encounter: Payer: Self-pay | Admitting: Cardiology

## 2020-09-24 ENCOUNTER — Other Ambulatory Visit: Payer: Self-pay

## 2020-09-24 ENCOUNTER — Other Ambulatory Visit (HOSPITAL_COMMUNITY): Payer: Self-pay

## 2020-09-24 ENCOUNTER — Ambulatory Visit (INDEPENDENT_AMBULATORY_CARE_PROVIDER_SITE_OTHER): Payer: Medicaid Other | Admitting: Cardiology

## 2020-09-24 VITALS — BP 128/82 | HR 77 | Ht 72.0 in | Wt 273.0 lb

## 2020-09-24 DIAGNOSIS — I480 Paroxysmal atrial fibrillation: Secondary | ICD-10-CM

## 2020-09-24 DIAGNOSIS — I5022 Chronic systolic (congestive) heart failure: Secondary | ICD-10-CM | POA: Diagnosis not present

## 2020-09-24 DIAGNOSIS — I2699 Other pulmonary embolism without acute cor pulmonale: Secondary | ICD-10-CM

## 2020-09-24 DIAGNOSIS — I484 Atypical atrial flutter: Secondary | ICD-10-CM

## 2020-09-24 LAB — COMPREHENSIVE METABOLIC PANEL
ALT: 37 IU/L (ref 0–44)
AST: 29 IU/L (ref 0–40)
Albumin/Globulin Ratio: 1.7 (ref 1.2–2.2)
Albumin: 4.3 g/dL (ref 3.8–4.9)
Alkaline Phosphatase: 97 IU/L (ref 44–121)
BUN/Creatinine Ratio: 12 (ref 9–20)
BUN: 15 mg/dL (ref 6–24)
Bilirubin Total: 0.8 mg/dL (ref 0.0–1.2)
CO2: 28 mmol/L (ref 20–29)
Calcium: 9.4 mg/dL (ref 8.7–10.2)
Chloride: 96 mmol/L (ref 96–106)
Creatinine, Ser: 1.3 mg/dL — ABNORMAL HIGH (ref 0.76–1.27)
Globulin, Total: 2.6 g/dL (ref 1.5–4.5)
Glucose: 138 mg/dL — ABNORMAL HIGH (ref 65–99)
Potassium: 3.7 mmol/L (ref 3.5–5.2)
Sodium: 139 mmol/L (ref 134–144)
Total Protein: 6.9 g/dL (ref 6.0–8.5)
eGFR: 64 mL/min/{1.73_m2} (ref >59)

## 2020-09-24 LAB — TSH: TSH: 0.673 u[IU]/mL (ref 0.450–4.500)

## 2020-09-24 LAB — T4, FREE: Free T4: 1.8 ng/dL — ABNORMAL HIGH (ref 0.82–1.77)

## 2020-09-24 MED ORDER — AMIODARONE HCL 200 MG PO TABS
200.0000 mg | ORAL_TABLET | Freq: Every day | ORAL | 3 refills | Status: DC
  Filled 2020-09-24: qty 90, 90d supply, fill #0

## 2020-09-24 NOTE — Progress Notes (Signed)
Electrophysiology Office Note:    Date:  09/24/2020   ID:  Donald [name], DOB 07/12/1960, MRN [id number]  PCP:  Lucianne Lei, MD  [facility] Cardiologist:  None  [facility] Electrophysiologist:  Vickie Epley, MD   Referring MD: Oliver Barre, PA   Chief Complaint: Atrial fibrillation flutter  History of Present Illness:    Donald [name] is a 60 y.o. male who presents for an evaluation of atrial fibrillation and flutter at the request of Adline Peals, PA-C. Their medical history includes chronic combined systolic and diastolic heart failure, submassive pulmonary embolism, COPD, hypertension, stroke, hyperlipidemia.  He was recently admitted to the hospital on July 12 through August 15, 2020.  He presented with chest pain, dyspnea, palpitations.  He was found to have submassive bilateral PE with evidence of pulmonary infarction.  He was anticoagulated.  He has since seen Adline Peals in follow-up.  During that appointment, he was noted to still be out of rhythm with uninterrupted anticoagulation use.  A cardioversion was performed on September 02, 2020.  He is maintained rhythm since that time and is feeling better.  He is exercising at the [facility] at least once per week.  He is taking his medications and brings them all to his appointment today.  He has a history of prior atrial flutter ablation at [facility] .    He is interested in a long-term rhythm control strategy that avoids antiarrhythmic use.  Past Medical History:  Diagnosis Date   Arrhythmia    A fib    Atrial fibrillation (HCC)    CHF (congestive heart failure) (HCC)    COPD (chronic obstructive pulmonary disease) ([place])    Hypertension    Sleep apnea    Stroke [facility])     Past Surgical History:  Procedure Laterality Date   ABLATION     CARDIOVERSION N/A 03/12/2020   Procedure: CARDIOVERSION;  Surgeon: Skeet Latch, MD;  Location: [place];  Service: Cardiovascular;  Laterality: N/A;   CARDIOVERSION N/A  08/13/2020   Procedure: CARDIOVERSION;  Surgeon: Jolaine Artist, MD;  Location: [place];  Service: Cardiovascular;  Laterality: N/A;   CARDIOVERSION N/A 09/02/2020   Procedure: CARDIOVERSION;  Surgeon: Elouise Munroe, MD;  Location[facility] ENDOSCOPY;  Service: Cardiovascular;  Laterality: N/A;   INCISION AND DRAINAGE PERIRECTAL ABSCESS N/A 10/20/2012   Procedure: IRRIGATION AND DEBRIDEMENT PERIRECTAL ABSCESS;  Surgeon: Imogene Burn. Georgette Dover, MD;  Location: [place];  Service: General;  Laterality: N/A;  Lithotomy   RIGHT/LEFT HEART CATH AND CORONARY ANGIOGRAPHY N/A 08/12/2020   Procedure: RIGHT/LEFT HEART CATH AND CORONARY ANGIOGRAPHY;  Surgeon: Jolaine Artist, MD;  Location: [place] CV LAB;  Service: Cardiovascular;  Laterality: N/A;   TEE WITHOUT CARDIOVERSION N/A 03/12/2020   Procedure: TRANSESOPHAGEAL ECHOCARDIOGRAM (TEE);  Surgeon: Skeet Latch, MD;  Location: [place];  Service: Cardiovascular;  Laterality: N/A;   TEE WITHOUT CARDIOVERSION N/A 08/13/2020   Procedure: TRANSESOPHAGEAL ECHOCARDIOGRAM (TEE);  Surgeon: Jolaine Artist, MD;  Location: [facility] ENDOSCOPY;  Service: Cardiovascular;  Laterality: N/A;    Current Medications: Current Meds  Medication Sig   Accu-Chek Softclix Lancets lancets Use as directed   albuterol (PROVENTIL HFA;VENTOLIN HFA) 108 (90 Base) MCG/ACT inhaler Inhale 2 puffs into the lungs every 4 (four) hours as needed for shortness of breath.   albuterol (PROVENTIL) (2.5 MG/3ML) 0.083% nebulizer solution Take 3 mLs (2.5 mg total) by nebulization every 4 (four) hours as needed for wheezing or shortness of breath.  amiodarone (PACERONE) 200 MG tablet Take 1 tablet (200 mg total) by mouth daily.   apixaban (ELIQUIS) 5 MG TABS tablet Take 1 tablet (5 mg total) by mouth 2 (two) times daily.   atorvastatin (LIPITOR) 20 MG tablet Take 1 tablet (20 mg total) by mouth daily.   Blood Glucose Monitoring Suppl (BLOOD GLUCOSE MONITOR SYSTEM) w/Device KIT USE AS  DIRECTED   budesonide-formoterol (SYMBICORT) 80-4.5 MCG/ACT inhaler Inhale 2 puffs into the lungs in the morning and at bedtime.   digoxin (LANOXIN) 0.125 MG tablet Take 1 tablet (0.125 mg total) by mouth daily.   glucose blood test strip Use as directed   mometasone-formoterol (DULERA) 100-5 MCG/ACT AERO Inhale 1 puff into the lungs daily.   sacubitril-valsartan (ENTRESTO) 24-26 MG Take 1 tablet by mouth 2 (two) times daily.   spironolactone (ALDACTONE) 25 MG tablet Take 1 tablet (25 mg total) by mouth daily.   torsemide (DEMADEX) 20 MG tablet Take 2 tablets (40 mg total) by mouth 2 (two) times daily.   [DISCONTINUED] amiodarone (PACERONE) 200 MG tablet Take 1 tablet (200 mg total) by mouth 2 (two) times daily.     Allergies:   Patient has no known allergies.   Social History   Socioeconomic History   Marital status: Single    Spouse name: Not on file   Number of children: Not on file   Years of education: Not on file   Highest education level: Not on file  Occupational History   Not on file  Tobacco Use   Smoking status: Former    Packs/day: 0.50    Types: Cigarettes    Quit date: 2018    Years since quitting: 4.6   Smokeless tobacco: Never  Substance and Sexual Activity   Alcohol use: Not Currently   Drug use: Not Currently    Types: Cocaine   Sexual activity: Not Currently  Other Topics Concern   Not on file  Social History Narrative   Not on file   Social Determinants of Health   Financial Resource Strain: Medium Risk   Difficulty of Paying Living Expenses: Somewhat hard  Food Insecurity: Food Insecurity Present   Worried About Charity fundraiser in the Last Year: Never true   Ran Out of Food in the Last Year: Sometimes true  Transportation Needs: Unmet Transportation Needs   Lack of Transportation (Medical): Yes   Lack of Transportation (Non-Medical): Yes  Physical Activity: Inactive   Days of Exercise per Week: 0 days   Minutes of Exercise per Session: 0  min  Stress: Not on file  Social Connections: Moderately Isolated   Frequency of Communication with Friends and Family: More than three times a week   Frequency of Social Gatherings with Friends and Family: More than three times a week   Attends Religious Services: 1 to 4 times per year   Active Member of [employer] or Organizations: No   Attends Music therapist: Never   Marital Status: Never married     Family History: The patient's family history includes Breast cancer in his mother; Heart attack (age of onset: 24) in his father; Heart attack (age of onset: 14) in his paternal uncle.  ROS:   Please see the history of present illness.    All other systems reviewed and are negative.  EKGs/Labs/Other Studies Reviewed:    The following studies were reviewed today:  August 07, 2020 echo personally reviewed Left ventricular function moderately decreased, 30% Right ventricular function moderately decreased  Moderately elevated PASP Dilated left and right atrium Mild MR Trivial AI  EKG:  The ekg ordered today demonstrates sinus rhythm  Recent Labs: 08/06/2020: TSH 0.536 08/07/2020: B Natriuretic Peptide 650.8 08/11/2020: Magnesium 1.9 08/29/2020: BUN 20; Creatinine, Ser 1.52; Hemoglobin 15.2; Platelets 257; Potassium 3.6; Sodium 134  Recent Lipid Panel    Component Value Date/Time   CHOL 86 03/08/2020 0506   TRIG 40 03/08/2020 0506   HDL 22 (L) 03/08/2020 0506   CHOLHDL 3.9 03/08/2020 0506   VLDL 8 03/08/2020 0506   LDLCALC 56 03/08/2020 0506    Physical Exam:    VS:  BP 128/82   Pulse 77   Ht 6' (1.829 m)   Wt 273 lb (123.8 kg)   BMI 37.03 kg/m     Wt Readings from Last 3 Encounters:  09/24/20 273 lb (123.8 kg)  09/16/20 268 lb (121.6 kg)  09/02/20 263 lb (119.3 kg)     GEN:  Well nourished, well developed in no acute distress.  Obese HEENT: Normal NECK: No JVD; No carotid bruits LYMPHATICS: No lymphadenopathy CARDIAC: RRR, no murmurs, rubs,  gallops RESPIRATORY:  Clear to auscultation without rales, wheezing or rhonchi  ABDOMEN: Soft, non-tender, non-distended MUSCULOSKELETAL:  No edema; No deformity  SKIN: Warm and dry NEUROLOGIC:  Alert and oriented x 3 PSYCHIATRIC:  Normal affect   ASSESSMENT:    1. Paroxysmal atrial fibrillation (HCC)   2. Atypical atrial flutter (HCC)   3. Chronic systolic heart failure (HCC)   4. Pulmonary embolism without acute cor pulmonale, unspecified chronicity, unspecified pulmonary embolism type (HCC)    PLAN:    In order of problems listed above:  1. Paroxysmal atrial fibrillation (HCC) 2. Atypical atrial flutter (HCC)  Symptomatic.  Currently taking amiodarone 200 mg by mouth twice daily.  Given some tingling in his digits, will decrease the dose to 200 mg by mouth once daily.  I will check a CMP, TSH and free T4 today.  Ultimately, I think he would benefit from a rhythm control strategy delineates long-term exposure to amiodarone given his young age.  We discussed repeat ablation during today's visit.  I would plan to repeat with a PVI, posterior wall ablation and CTI.  I would like to wait at least 6 months from today to allow him to completely recover from his pulmonary embolism.  He should continue Eliquis.  I will plan to see him back in 6 months to discuss scheduling the ablation.  In the interim, he should continue to work on weight loss and exercise.  3. Chronic systolic heart failure (HCC) NYHA class II today.  Exercising regularly.  Last ejection fraction measured at 30%.  We will need to repeat an echo prior to the ablation to reassess his left ventricular function.  ICD would need to be considered in the future if no improvement in his ejection fraction.?  MRI.  4. Pulmonary embolism without acute cor pulmonale, unspecified chronicity, unspecified pulmonary embolism type (HCC) Improving on anticoagulation.  We will plan to see him back in about 6 months     Total time spent  with patient today 50 minutes. This includes reviewing records, evaluating the patient and coordinating care.  Medication Adjustments/Labs and Tests Ordered: Current medicines are reviewed at length with the patient today.  Concerns regarding medicines are outlined above.  Orders Placed This Encounter  Procedures   Comp Met (CMET)   TSH   T4, free   EKG 12-Lead   Meds ordered this encounter  Medications   amiodarone (PACERONE) 200 MG tablet    Sig: Take 1 tablet (200 mg total) by mouth daily.    Dispense:  90 tablet    Refill:  3     Signed, Lysbeth Galas T. Quentin Ore, MD, [facility], [facility] 09/24/2020 11:05 AM    Electrophysiology [facility]

## 2020-09-24 NOTE — Patient Instructions (Addendum)
Medication Instructions:  Reduce Amiodarone to 200 mg daily Your physician recommends that you continue on your current medications as directed. Please refer to the Current Medication list given to you today.  Labwork: CMP, TSH, Free T4  Testing/Procedures: None ordered.  Follow-Up: Your physician wants you to follow-up in: 04/01/21 at 9:20 am with  Steffanie Dunn, MD    Any Other Special Instructions Will Be Listed Below (If Applicable).  If you need a refill on your cardiac medications before your next appointment, please call your pharmacy.

## 2020-09-24 NOTE — Progress Notes (Signed)
Paramedicine Encounter    Patient ID: Donald [name], male    DOB: 1960-05-16, 60 y.o.   MRN: [id number]  Medication review completed. Medication pill box filled for 5 days. Bubble packs to [facility] by [facility] and will be delivered by me tomorrow or Thursday.   [name] requested a new PCP. I will set up with [facility].   Clinic visit planned for next Wednesday at 12, ride pick up scheduled with [facility] for a pick up time of 11:20.   Med transfer note sent to HF clinic and [facility] pharmacy.    ACTION: Home visit completed

## 2020-09-25 ENCOUNTER — Other Ambulatory Visit (HOSPITAL_COMMUNITY): Payer: Self-pay | Admitting: *Deleted

## 2020-09-25 ENCOUNTER — Other Ambulatory Visit: Payer: Self-pay

## 2020-09-25 MED ORDER — AMIODARONE HCL 200 MG PO TABS: 200.0000 mg | ORAL_TABLET | Freq: Every day | ORAL | 3 refills | Status: DC

## 2020-09-25 MED ORDER — TORSEMIDE 20 MG PO TABS: 40.0000 mg | ORAL_TABLET | Freq: Two times a day (BID) | ORAL | 6 refills | Status: DC

## 2020-09-25 MED ORDER — APIXABAN 5 MG PO TABS
5.0000 mg | ORAL_TABLET | Freq: Two times a day (BID) | ORAL | 3 refills | Status: DC
Start: 2020-09-25 — End: 2021-01-07

## 2020-09-25 MED ORDER — SPIRONOLACTONE 25 MG PO TABS: 25.0000 mg | ORAL_TABLET | Freq: Every day | ORAL | 3 refills | Status: DC

## 2020-09-25 MED ORDER — DIGOXIN 125 MCG PO TABS: 0.1250 mg | ORAL_TABLET | Freq: Every day | ORAL | 3 refills | Status: DC

## 2020-09-25 MED ORDER — ATORVASTATIN CALCIUM 20 MG PO TABS: 20.0000 mg | ORAL_TABLET | Freq: Every day | ORAL | 3 refills | Status: DC

## 2020-09-26 ENCOUNTER — Other Ambulatory Visit: Payer: Self-pay

## 2020-09-26 ENCOUNTER — Other Ambulatory Visit (HOSPITAL_COMMUNITY): Payer: Self-pay

## 2020-09-26 ENCOUNTER — Telehealth (HOSPITAL_COMMUNITY): Payer: Self-pay

## 2020-09-26 NOTE — Telephone Encounter (Signed)
Attempting to get [name] medications all moved over to [facility] pharmacy for delivery however he has not seen a PCP in over 4 months and his existing PCP Dr. Parke Donald [name] reports she never prescribed the medications in his chart. (Inhalers, blood glucose monitoring supplies). I am setting [name] up with PCP at [facility] to obtain these prescriptions eventually but he needs inhalers and glucose supplies now.   Without RX [facility] cannot fill these and [facility] do not have current prescriptions of same to transfer to [facility] for [name]. Assistance needed for his chronic care medications (inhalers, glucose supplies) until he can be established with PCP.   -Note patient has bubble packs with [facility], of prescriptions pills and is taking same.   -All cardiac prescriptions have been sent over to [facility] by HF clinic.   I will continue to follow.

## 2020-09-26 NOTE — Progress Notes (Signed)
Paramedicine Encounter    Patient ID: Donald [name], male    DOB: 30-Sep-1960, 60 y.o.   MRN: [id number]  Delivered one week of bubble packs to Donald [name]. Medications confirmed and I educated Donald [name] on use of bubble packs. He understands and knows about each medication and when to take same.   He received a call from [place] with [facility] about pill packaging. I discussed with Donald [name] and he elects to continue with our paramedicine services and using [facility] for bubble packs and delivery. Donald [name] made aware and understands plan.   I will see Donald [name] in clinic next week. He has his appointment and transportation set up. I will obtain remainder of bubble packs from [facility] next week.  Visit complete.   ACTION: Home visit completed

## 2020-09-26 NOTE — Patient Outreach (Addendum)
Medicaid Managed Care    Pharmacy Note  09/26/2020 Name: Donald [name] MRN: [id number] DOB: Sep 16, 1960  Donald [name] is a 60 y.o. year old male who is a primary care patient of Donald Lei, MD. The [facility] Managed Care Coordination team was consulted for assistance with disease management and care coordination needs.    Engaged with patient Engaged with patient by telephone for initial visit in response to referral for case management and/or care coordination services.  Donald [name] was given information about Managed Medicaid Care Coordination team services today. Donald [name] agreed to services and verbal consent obtained.   Objective:  Lab Results  Component Value Date   CREATININE 1.30 (H) 09/24/2020   CREATININE 1.52 (H) 08/29/2020   CREATININE 1.40 (H) 08/22/2020    Lab Results  Component Value Date   HGBA1C 7.2 (H) 08/09/2020       Component Value Date/Time   CHOL 86 03/08/2020 0506   TRIG 40 03/08/2020 0506   HDL 22 (L) 03/08/2020 0506   CHOLHDL 3.9 03/08/2020 0506   VLDL 8 03/08/2020 0506   LDLCALC 56 03/08/2020 0506    Other: (TSH, CBC, Vit D, etc.)  Clinical ASCVD: Yes  The ASCVD Risk score Donald [name]., et al., 2013) failed to calculate for the following reasons:   The patient has a prior MI or stroke diagnosis    Other: (CHADS2VASc if Afib, PHQ9 if depression, MMRC or CAT for COPD, ACT, DEXA)  BP Readings from Last 3 Encounters:  09/24/20 128/82  09/16/20 120/82  09/06/20 (!) 144/91    Assessment/Interventions: Review of patient past medical history, allergies, medications, health status, including review of consultants reports, laboratory and other test data, was performed as part of comprehensive evaluation and provision of chronic care management services.   Cardio -CVA 2018 -Echo 07/2020 EF down 30-35% with RV moderately reduced  -ECHO 03/08/2020 EF 40-45% RV mildly reduced, RA/LA severely dilated, mild-mod MV regurgitation Amiodarone 217m  take 2QD Apixaban 531mQD Digoxin 0.12527mntresto 24/26 Spironolactone 60m60mxtensive time spent on counseling patient on blood pressure goal and impact of each antihypertensive medication on their blood pressure and risk reduction for CV disease.  Used analogies to explain the need for multiple antihypertensive medications to achieve BP goals and that it is often a silent disease with no symptoms.  Plan: At goal,  patient stable/ symptoms controlled   Edema -Torsemide 20mg82me 2BID Plan: At goal,  patient stable/ symptoms controlled   Lipids Atorvastatin 20mg 50mr 15 minutes spent counseling patient on role of hyperlipidemia on heart disease and the impact of each lipid subclass with emphasis on LDL and heart disease risk.  Explained role of statins in prevention of CV disease complications and actual risk for adverse effects with statin treatment.  Counseled patient on genetic component placing them at risk for hyperlipidemia. Plan: At goal,  patient stable/ symptoms controlled   COPD Dulera Albuterol Tried/Failed: Symbicort Plan: Was taking both long acting inhalers, told to only take the one prescribed. Explained about preventative therapy with Albuterol  Meds: -First thing patient told me was he wanted his meds packed like he sees on TV. I explained [facility] process and he was very enthused. He asked me to call, Donald [name] [phone] his meds. As I called her, she already got meds in bubble packs and was on her way to deliver. Spoke with Donald Connhe said that it would be easier to maintain meds with Bubble Packs  and that we could re-assess in a few months. Will f/u in December -Verbal consent obtained for [facility] enhanced pharmacy services (medication synchronization, adherence packaging, delivery coordination). A medication sync plan was created to allow patient to get all medications delivered once every 30 to 90 days per patient preference.  Patient understands they have freedom to choose pharmacy and clinical pharmacist will coordinate care between all prescribers and [facility].  Medication Name                        (please note if Rx is PRN) Prescriber                                                                  (list Provider Name & Phone Number)                                  Timing    Refill Timing Last Fill Date & DS       (if last fill/DS unavailable, list pt.'s quantity on hand) Anticipated next due date    BB B L EM BT     TS/Lancets (Accucheck Guide and Softclix) Donald Friendly, MD [phone]   Auto Fill 08/15/2020 10/09/20  Torsemide 52m 2 BID Donald [name] [phone] 2 2   Cycle Fill 09/11/20 for 30 days 10/11/20  Dulera 100-5 EAlma Friendly MD [phone]    Cycle Fill 08/15/20 for 30 days 10/11/20  Spironolactone 252m Donald [name] [phone]    Cycle Fill 09/18/20 for 30 days 10/18/20  Entresto 24/26 Donald [name] [phone]  1  Cycle Fill 09/11/20 for 30 days 10/11/20  Digoxin 0.125 Donald [name] [phone]    Cycle Fill 09/11/20 for 30 days 10/11/20  Eliquis 57m67mid Donald [name] [phone]  1  Cycle Fill 09/11/20 for 30 days 10/11/20  Atorvastatin 29m67my [name] [phone]  Cycle Fill 09/11/20 for 30 days 10/11/20  Amiodarone 200mg66m [name] [phone]  Cycle Fill 09/11/20 for 30 days 10/11/20  Albuterol Puffer EzendAlma [phone]       SDOH (Social Determinants of Health) assessments and interventions performed:    Care Plan  No Known Allergies  Medications Reviewed Today     Reviewed by Donald [name] ([facility]) on 09/26/20 at 1002 35 List Status: <None>   Medication Order Taking? Sig Documenting Provider Last Dose Status Informant  Accu-Chek Softclix Lancets lancets [id number] as directed EzendAlma FriendlyTaking Active Self  albuterol (PROVENTIL HFA;VENTOLIN HFA) 108 (90 Base) MCG/ACT inhaler [id number] Inhale 2 puffs into the lungs every 4 (four) hours as needed for shortness of breath. [provider] Taking Active Self  albuterol (PROVENTIL) (2.5 MG/3ML) 0.083% nebulizer solution [id number] 3 mLs (2.5 mg total) by nebulization every 4 (four) hours as needed for wheezing or shortness of breath. BelfiMalvin JohnsTaking Active Self  amiodarone (PACERONE) 200 MG tablet [id number] 1 tablet (200 mg total) by mouth daily. CleggConrad BurlingtonTaking Active  apixaban (ELIQUIS) 5 MG TABS tablet [id number] Yes Take 1 tablet (5 mg total) by mouth 2 (two) times daily. [name], Donald D, NP Taking Active   atorvastatin (LIPITOR) 20 MG tablet [id number] Yes Take 1 tablet (20 mg total) by mouth daily. Darrick Grinder D, NP Taking Active   Blood Glucose Monitoring Suppl (BLOOD GLUCOSE MONITOR SYSTEM) w/Device KIT [id number] No USE AS DIRECTED  Patient not taking: Reported on 09/26/2020   Donald Friendly, MD Not Taking Consider Medication Status and Discontinue Self  budesonide-formoterol (SYMBICORT) 80-4.5 MCG/ACT inhaler [id number]  Inhale 2 puffs into the lungs in the morning and at bedtime. Donald Friendly, MD  Expired 09/24/20 2359 Self  digoxin (LANOXIN) 0.125 MG tablet [id number] Yes Take 1 tablet (0.125 mg total) by mouth daily. Darrick Grinder D, NP Taking Active   glucose blood test strip [id number] Yes Use as directed Donald Friendly, MD Taking Active Self  mometasone-formoterol [facility]) 100-5 MCG/ACT AERO [id number] Yes Inhale 1 puff into the lungs daily. [provider] Taking Active Self  sacubitril-valsartan (ENTRESTO) 24-26 MG [id number] Yes Take 1 tablet by mouth 2 (two) times daily. Darrick Grinder D, NP Taking Active   spironolactone (ALDACTONE) 25 MG tablet [id number] Yes Take 1 tablet (25 mg total) by mouth daily. Darrick Grinder D, NP Taking Active   torsemide (DEMADEX) 20 MG tablet [id number] Yes Take 2 tablets (40 mg total) by mouth 2 (two) times daily. Darrick Grinder D, NP Taking  Active             Patient Active Problem List   Diagnosis Date Noted   Atypical atrial flutter ([place]) 08/29/2020   Secondary hypercoagulable state ([place]) 08/29/2020   CAP (community acquired pneumonia) 08/06/2020   OSA on CPAP 08/06/2020   Leukocytosis 08/06/2020   Acute pulmonary embolism ([place]) 08/06/2020   Atrial fibrillation with RVR ([place]) 03/08/2020   Pulmonary embolism ([place]) 03/08/2020   Essential hypertension 03/08/2020   COPD (chronic obstructive pulmonary disease) ([place]) 03/08/2020   Anemia 03/08/2020   COPD exacerbation ([place]) 11/16/2014   Atypical chest pain 11/16/2014   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   Paroxysmal atrial fibrillation ([place]) 11/16/2014   Acute bronchitis 11/16/2014   Abnormal EKG 11/16/2014   Type 2 diabetes mellitus without complication ([place]) 57/26/2035   Perirectal abscess 10/20/2012    Conditions to be addressed/monitored: Atrial Fibrillation, COPD, and DM  Care Plan : Medication Management  Updates made by Lane [name], [place] since 09/26/2020 12:00 AM     Problem: Health Promotion or Disease Self-Management (General Plan of Care)      Goal: Medication Management   Note:   Current Barriers:  Does not contact provider office for questions/concerns   Pharmacist Clinical Goal(s):  Over the next 90 days, patient will contact provider office for questions/concerns as evidenced notation of same in electronic health record through collaboration with PharmD and provider.    Interventions: Inter-disciplinary care team collaboration (see longitudinal plan of care) Comprehensive medication review performed; medication list updated in electronic medical record  {PHR,RXCPHEALTHMAINTENANCE]  Patient Goals/Self-Care Activities Over the next 90 days, patient will:  - take medications as prescribed  Follow Up Plan: The patient has been provided with contact information for the care management team and has been advised to call with any  health related questions or concerns.      Task: Mutually Develop and Royce Macadamia Achievement of Patient Goals   Note:   Care Management Activities:    - verbalization of feelings encouraged  Notes:        Medication Assistance: None required. Patient affirms current coverage meets needs.   Follow up: Agree  Plan: The care management team will reach out to the patient again over the next 90 days.   [place] Constable, Pharm.D., Managed Medicaid Pharmacist - [phone]

## 2020-09-26 NOTE — Patient Instructions (Signed)
Visit Information  Donald [name] was given information about Medicaid Managed Care team care coordination services as a part of their [facility] Community Plan Medicaid benefit. Donald [name] verbally consented to engagement with the [facility] Managed Care team.   If you are experiencing a medical emergency, please call 911 or report to your local emergency department or urgent care.   If you have a non-emergency medical problem during routine business hours, please contact your provider's office and ask to speak with a nurse.   For questions related to your [facility], please call: [phone] or visit the homepage here: [email or web address]  If you would like to schedule transportation through your [facility], please call the following number at least 2 days in advance of your appointment: [phone].   Call the Behavioral Health Crisis Line at [phone], at any time, 24 hours a day, 7 days a week. If you are in danger or need immediate medical attention call 911.  If you would like help to quit smoking, call 1-800-QUIT-NOW (310-503-2077) OR Espaol: 1-855-Djelo-Ya (1-062-694-8546) o para ms informacin haga clic aqu or Text READY to 270-350 to register via text  Donald [name] - following are the goals we discussed in your visit today:   Goals Addressed   None     Please see education materials related to HTN provided by MyChart link.  The patient verbalized understanding of instructions provided today and declined a print copy of patient instruction materials.   The  Patient                                              has been provided with contact information for the Managed Medicaid care management team and has been advised to call with any health related questions or concerns.   Donald [name], Pharm.D., Managed Medicaid Pharmacist [phone]   Following is a  copy of your plan of care:  Patient Care Plan: General Plan of Care (Adult)     Problem Identified: Health Promotion or Disease Self-Management (General Plan of Care)   Priority: High  Onset Date: 08/23/2020     Long-Range Goal: Self-Management Plan Developed   Start Date: 08/23/2020  Expected End Date: 11/23/2020  Recent Progress: Not on track  Priority: High  Note:   Current Barriers:  Ineffective Self Health Maintenance Currently UNABLE TO independently self manage needs related to chronic health conditions.  Knowledge Deficits related to short term plan for care coordination needs and long term plans for chronic disease management needs related to HTN, COPD, Afib, HF, DM2, OSA Nurse Case Manager Clinical Goal(s):  patient will work with care management team to address care coordination and chronic disease management needs related to Disease Management Educational Needs Care Coordination Medication Management and Education Medication Reconciliation Medication Assistance    Interventions:  Evaluation of current treatment plan and patient's adherence to plan as established by provider. Reviewed medications with patient Collaborated with pharmacy regarding medications Discussed plans with patient for ongoing care management follow up and provided patient with direct contact information for care management team Advised patient, providing education and rationale, to monitor blood pressure daily and record, calling provider for findings outside established parameters.  Reviewed scheduled/upcoming provider appointments. Advised patient, providing education and rationale, to check cbg  and record, calling provider  for findings outside established parameters.  Advised patient, providing education and rationale, to weigh daily and record, calling provider for weight gain of 3lbs overnight or 5 pounds in a week.  Care Guide referral for new PCP. Update 09/23/20:  Care guide tried to call  patient three times, was unsuccessful in reaching patient.  Patient given phone number to call and follow up. Pharmacy referral for medication review. Update 09/23/20:  Patient has an appointment with Pharmacist 09/26/20. Self Care Activities:  Patient will self administer medications as prescribed Patient will attend all scheduled provider appointments Patient will call pharmacy for medication refills Patient will continue to perform ADL's independently Patient will continue to perform IADL's independently Patient will call provider office for new concerns or questions Patient Goals: In the next 30 days, patient will attend all appointments. In the next 30 days, patient will speak with Pharmacist. In the next 30 days, patient will find new PCP. - Follow Up Plan: The patient has been provided with contact information for the care management team and has been advised to call with any health related questions or concerns.  The care management team will reach out to the patient again over the next 30 days.    Evidence-based guidance:   Review biopsychosocial determinants of health screens.  Review need for preventive screening based on age, sex, family history and health history.  Determine level of modifiable health risk.  Discuss identified risks.  Identify areas where behavior change may lead to improved health.  Promote healthy lifestyle.  Evoke change talk using open-ended questions, pros and cons, as well as looking forward.  Identify and manage conditions or preconditions to reduce health risk.  Implement additional goals and interventions based on identified risk factors.     Task: Mutually Develop and Malen Gauze Achievement of Patient Goals   Note:   Care Management Activities:    - verbalization of feelings encouraged    Notes:     Patient Care Plan: Medication Management     Problem Identified: Health Promotion or Disease Self-Management (General Plan of Care)       Goal: Medication Management   Note:   Current Barriers:  Does not contact provider office for questions/concerns   Pharmacist Clinical Goal(s):  Over the next 90 days, patient will contact provider office for questions/concerns as evidenced notation of same in electronic health record through collaboration with PharmD and provider.    Interventions: Inter-disciplinary care team collaboration (see longitudinal plan of care) Comprehensive medication review performed; medication list updated in electronic medical record  {PHR,RXCPHEALTHMAINTENANCE]  Patient Goals/Self-Care Activities Over the next 90 days, patient will:  - take medications as prescribed  Follow Up Plan: The patient has been provided with contact information for the care management team and has been advised to call with any health related questions or concerns.      Task: Mutually Develop and Malen Gauze Achievement of Patient Goals   Note:   Care Management Activities:    - verbalization of feelings encouraged    Notes:

## 2020-10-01 NOTE — Progress Notes (Signed)
Advanced Heart Failure Clinic Note   Primary Care: Dr Criss Rosales  Primary Cardiologist: Dr Minna Merritts  HF Cardiologist: Dr Haroldine Laws  HPI: [name] is a 60 y.o.with a history of a fib, A fib ablation, CVA 2018, COPD, OSA, HTN, obesity, and chronic systolic heart failure.  Looks like he has been taking his medications intermittently.    Admitted 12/2019 at [facility] with A/C systolic heart failure and A fib RVR. Diuresed and transitioned back to po lasix.  There was concern about medication compliance.    Followed by Dr Minna Merritts for heart failure and was last seen 01/30/20. At that time he was running out of his medications. HF meds refilled. He had also planned to obtain cardioversion.   Admitted 03/07/20 with increased shortness of breath. CTA concerning for acute bilateral PE and A fib RVR. Had successful DCCV on 03/12/20. Discharged on lasix 40 mg daily, carvedilol 12.5 mg bid, Eliquis, and spironolactone 25 mg daily.   Admitted 08/06/20 with chest pain, A fib RVR, acute PE, and A/C systolic heart failure. CT chest showed acute bilateral submassive PE w/ RV strain. He had not been taking Eliquis. Anticoagulated with heparin drip followed by Eliquis.   Lower extremity dopplers negative. Echo showed EF down to 30-35% with RV moderately reduced. Diuresed with IV lasix and placed on amio drip. Once stabilized, had LHC/RHC with nonobstructive CAD, low normal CO, and EF 25%.  He later had TEE with successful cardioversion. He was discharged on Eliquis and given meds prior to discharge. He was referred to HF paramedicine   At post hospital follow up, he was back in atrial flutter, but volume was OK. Amio was increased and he was referred to EP for ablation.  S/p DCCV 8/22 to SB. Seen by EP and plan for ablation in 6 months; amio reduced due to finger tingling.  Today he returns for HF follow up with paramedicine. No significant exertional dyspnea however he is not very active. He still has some  bilateral finger tingling. Denies CP, dizziness, edema, or PND/Orthopnea. Appetite ok. No fever or chills. Weight at home 271 pounds. Taking all medications. He does not have a license due to DUI x 2. Uses food stamps. Lives alone. He is disabled. He uses his scooter or [facility] to get to appointments.  Cardiac Testing  - DCCV 09/02/20-->SB - DC-CV 08/13/20 --200 j x1--> NSR  - DC-CV 03/13/2019--->NSR   - Had A flutter ablation in 2019   - L [facility] 08/12/2020:  . Mid RCA lesion is 60% stenosed.   Prox RCA lesion is 30% stenosed.   RPDA lesion is 30% stenosed.   1st Mrg lesion is 30% stenosed.   Prox LAD to Mid LAD lesion is 20% stenosed.   2nd Diag lesion is 40% stenosed.   The left ventricular ejection fraction is less than 25% by visual estimate.  Findings:   Ao = 109/81 (93)  LV = 102/9  RA = 7  RV = 46/4  PA = 49/17 (32)  PCW = 16 (v = 25)  Fick cardiac output/index = 5.5/2.3  PVR = 2.9  Ao sat = 97%  PA sat = 67%,68%   - TEE 08/13/2020 EF 20% + smoke no clot  - Echo 07/2020 EF down 30-35% with RV moerately reduced  - Echo 03/08/2020 EF 40-45% RV mildly reduced, RA/LA severely dilated, mild-mod MV regurgitatio  - CTA 03/08/2020: Partially occlusive thrombus within segmental and subsegmental arterial branches within the bilateral lungs as  described above. No evidence of right ventricular heart strain. Small right pleural effusion. Bilateral patchy ground-glass opacity seen within both lung bases right greater than left which could be due to atelectasis, or infectious etiology.  - Nuclear Stress Test 2018  1. No reversible ischemia or infarction.  2. Mild generalized hypokinesis. No focal left ventricular wall  motion defect beyond mild generalized hypokinesis.  3. Left ventricular ejection fraction 43%    Past Medical History:  Diagnosis Date   Arrhythmia    A fib    Atrial fibrillation (HCC)    CHF (congestive heart failure) (HCC)    COPD (chronic obstructive  pulmonary disease) (HCC)    Hypertension    Sleep apnea    Stroke [facility])    Current Outpatient Medications  Medication Sig Dispense Refill   Accu-Chek Softclix Lancets lancets Use as directed 100 each 0   albuterol (PROVENTIL HFA;VENTOLIN HFA) 108 (90 Base) MCG/ACT inhaler Inhale 2 puffs into the lungs every 4 (four) hours as needed for shortness of breath.     albuterol (PROVENTIL) (2.5 MG/3ML) 0.083% nebulizer solution Take 3 mLs (2.5 mg total) by nebulization every 4 (four) hours as needed for wheezing or shortness of breath. 30 vial 0   amiodarone (PACERONE) 200 MG tablet Take 1 tablet (200 mg total) by mouth daily. 90 tablet 3   apixaban (ELIQUIS) 5 MG TABS tablet Take 1 tablet (5 mg total) by mouth 2 (two) times daily. 60 tablet 3   atorvastatin (LIPITOR) 20 MG tablet Take 1 tablet (20 mg total) by mouth daily. 30 tablet 3   Blood Glucose Monitoring Suppl (BLOOD GLUCOSE MONITOR SYSTEM) w/Device KIT USE AS DIRECTED 1 kit 0   budesonide-formoterol (SYMBICORT) 80-4.5 MCG/ACT inhaler Inhale 2 puffs into the lungs in the morning and at bedtime. 10.2 g 0   digoxin (LANOXIN) 0.125 MG tablet Take 1 tablet (0.125 mg total) by mouth daily. 30 tablet 3   glucose blood test strip Use as directed 100 each 0   mometasone-formoterol (DULERA) 100-5 MCG/ACT AERO Inhale 1 puff into the lungs daily.     sacubitril-valsartan (ENTRESTO) 24-26 MG Take 1 tablet by mouth 2 (two) times daily. 60 tablet 3   spironolactone (ALDACTONE) 25 MG tablet Take 1 tablet (25 mg total) by mouth daily. 30 tablet 3   torsemide (DEMADEX) 20 MG tablet Take 2 tablets (40 mg total) by mouth 2 (two) times daily. 120 tablet 6   No current facility-administered medications for this encounter.   No Known Allergies  Social History   Socioeconomic History   Marital status: Single    Spouse name: Not on file   Number of children: Not on file   Years of education: Not on file   Highest education level: Not on file  Occupational  History   Not on file  Tobacco Use   Smoking status: Former    Packs/day: 0.50    Types: Cigarettes    Quit date: 2018    Years since quitting: 4.6   Smokeless tobacco: Never  Substance and Sexual Activity   Alcohol use: Not Currently   Drug use: Not Currently    Types: Cocaine   Sexual activity: Not Currently  Other Topics Concern   Not on file  Social History Narrative   Not on file   Social Determinants of Health   Financial Resource Strain: Medium Risk   Difficulty of Paying Living Expenses: Somewhat hard  Food Insecurity: Food Insecurity Present   Worried About Running  Out of Food in the Last Year: Never true   Ran Out of Food in the Last Year: Sometimes true  Transportation Needs: Unmet Transportation Needs   Lack of Transportation (Medical): Yes   Lack of Transportation (Non-Medical): Yes  Physical Activity: Inactive   Days of Exercise per Week: 0 days   Minutes of Exercise per Session: 0 min  Stress: Not on file  Social Connections: Moderately Isolated   Frequency of Communication with Friends and Family: More than three times a week   Frequency of Social Gatherings with Friends and Family: More than three times a week   Attends Religious Services: 1 to 4 times per year   Active Member of [employer] or Organizations: No   Attends Archivist Meetings: Never   Marital Status: Never married  Human resources officer Violence: Not At Risk   Fear of Current or Ex-Partner: No   Emotionally Abused: No   Physically Abused: No   Sexually Abused: No   Family History  Problem Relation Age of Onset   Breast cancer Mother    Heart attack Father 85   Heart attack Paternal Uncle 72   BP (!) 148/87   Pulse 77   Wt 123.2 kg (271 lb 9.6 oz)   SpO2 95%   BMI 36.84 kg/m   Wt Readings from Last 3 Encounters:  10/02/20 123.2 kg (271 lb 9.6 oz)  09/24/20 123.8 kg (273 lb)  09/16/20 121.6 kg (268 lb)   PHYSICAL EXAM: General:  NAD. No resp difficulty HEENT:  Normal Neck: Supple. No JVD. Carotids 2+ bilat; no bruits. No lymphadenopathy or thryomegaly appreciated. Cor: PMI nondisplaced. Regular rate & rhythm. No rubs, gallops or murmurs. Lungs: Clear Abdomen: Obese, nontender, nondistended. No hepatosplenomegaly. No bruits or masses. Good bowel sounds. Extremities: No cyanosis, clubbing, rash, edema Neuro: Alert & oriented x 3, cranial nerves grossly intact. Moves all 4 extremities w/o difficulty. Affect pleasant.  ECG: SR 81 bpm (personally reviewed).  ASSESSMENT & PLAN: 1. Chronic Systolic Heart Failure, NICM.  L[facility] 07/2020 non obs CAD, low normal cardiac output.  - Echo 02/2020 EF 40-45% -->ECHO 07/2020 EF down 30-35% with RV moerately reduced, and RA/LA moderately dilated. RA 15. - NYHA II, volume good today. - Start Farxiga 10 mg daily. - Continue torsemide 40 mg bid.  - Continue Entresto 24-26 mg bid.  - Continue digoxin 0.125 mg daily. - Continue spironolactone 25 mg daily. - BMET & dig level today; repeat BMET in 10 days.  2. PAF - Had ablation in 2019 at [facility].  - DCCV 7/22 with restoration of NSR.  - Back AFL 7/22 at hospital follow up. - s/p DCCV 09/02/20 to SB. SR on ECG today. - EP plans on ablation ~6 months. - Continue Eliquis 5 mg bid. No bleeding issues. - Continue amio 200 mg daily.  3. OSA - Sleep study ordered.   4. Recurrent PE  - H/O PE back in February 2022. He was not taking Eliquis. - CTA 07/2020 showed acute bilateral submassive PE. L/E [place] negative -> No indication for filter placement.  - Continue Eliquis.   5. SDOH - He has been referred to [place] for PCP. - Appreciate paramedicine's assistance.  Follow up in 3-4 weeks with APP for further medication titration. Will consider timing of repeat echo with GDMT optimized.  Allena Katz, FNP-BC 10/02/20

## 2020-10-02 ENCOUNTER — Other Ambulatory Visit (HOSPITAL_COMMUNITY): Payer: Self-pay

## 2020-10-02 ENCOUNTER — Other Ambulatory Visit (HOSPITAL_COMMUNITY): Payer: Self-pay | Admitting: Family Medicine

## 2020-10-02 ENCOUNTER — Other Ambulatory Visit: Payer: Self-pay

## 2020-10-02 ENCOUNTER — Ambulatory Visit (HOSPITAL_COMMUNITY)
Admission: RE | Admit: 2020-10-02 | Discharge: 2020-10-02 | Disposition: A | Discharge status: Home / Self Care | Payer: Medicaid Other | Source: Ambulatory Visit | Attending: Family Medicine | Admitting: Family Medicine

## 2020-10-02 ENCOUNTER — Encounter (HOSPITAL_COMMUNITY): Payer: Self-pay

## 2020-10-02 VITALS — BP 148/87 | HR 77 | Wt 271.6 lb

## 2020-10-02 DIAGNOSIS — Z86711 Personal history of pulmonary embolism: Secondary | ICD-10-CM | POA: Insufficient documentation

## 2020-10-02 DIAGNOSIS — Z139 Encounter for screening, unspecified: Secondary | ICD-10-CM

## 2020-10-02 DIAGNOSIS — I5022 Chronic systolic (congestive) heart failure: Secondary | ICD-10-CM | POA: Diagnosis not present

## 2020-10-02 DIAGNOSIS — Z79899 Other long term (current) drug therapy: Secondary | ICD-10-CM | POA: Insufficient documentation

## 2020-10-02 DIAGNOSIS — Z8249 Family history of ischemic heart disease and other diseases of the circulatory system: Secondary | ICD-10-CM | POA: Insufficient documentation

## 2020-10-02 DIAGNOSIS — I2699 Other pulmonary embolism without acute cor pulmonale: Secondary | ICD-10-CM

## 2020-10-02 DIAGNOSIS — I10 Essential (primary) hypertension: Secondary | ICD-10-CM

## 2020-10-02 DIAGNOSIS — I428 Other cardiomyopathies: Secondary | ICD-10-CM | POA: Insufficient documentation

## 2020-10-02 DIAGNOSIS — Z8673 Personal history of transient ischemic attack (TIA), and cerebral infarction without residual deficits: Secondary | ICD-10-CM | POA: Insufficient documentation

## 2020-10-02 DIAGNOSIS — I2510 Atherosclerotic heart disease of native coronary artery without angina pectoris: Secondary | ICD-10-CM | POA: Diagnosis not present

## 2020-10-02 DIAGNOSIS — Z7901 Long term (current) use of anticoagulants: Secondary | ICD-10-CM | POA: Insufficient documentation

## 2020-10-02 DIAGNOSIS — I480 Paroxysmal atrial fibrillation: Secondary | ICD-10-CM

## 2020-10-02 DIAGNOSIS — Z87891 Personal history of nicotine dependence: Secondary | ICD-10-CM | POA: Insufficient documentation

## 2020-10-02 DIAGNOSIS — G4733 Obstructive sleep apnea (adult) (pediatric): Secondary | ICD-10-CM | POA: Insufficient documentation

## 2020-10-02 DIAGNOSIS — Z7951 Long term (current) use of inhaled steroids: Secondary | ICD-10-CM | POA: Insufficient documentation

## 2020-10-02 DIAGNOSIS — I110 Hypertensive heart disease with heart failure: Secondary | ICD-10-CM | POA: Insufficient documentation

## 2020-10-02 LAB — BASIC METABOLIC PANEL
Anion gap: 10 (ref 5–15)
BUN: 13 mg/dL (ref 6–20)
CO2: 32 mmol/L (ref 22–32)
Calcium: 9.4 mg/dL (ref 8.9–10.3)
Chloride: 94 mmol/L — ABNORMAL LOW (ref 98–111)
Creatinine, Ser: 1.16 mg/dL (ref 0.61–1.24)
GFR, Estimated: >60 mL/min (ref >60)
Glucose, Bld: 155 mg/dL — ABNORMAL HIGH (ref 70–99)
Potassium: 3.5 mmol/L (ref 3.5–5.1)
Sodium: 136 mmol/L (ref 135–145)

## 2020-10-02 LAB — BRAIN NATRIURETIC PEPTIDE: B Natriuretic Peptide: 118.2 pg/mL — ABNORMAL HIGH (ref 0.0–100.0)

## 2020-10-02 LAB — DIGOXIN LEVEL: Digoxin Level: 1.6 ng/mL (ref 0.8–2.0)

## 2020-10-02 MED ORDER — DAPAGLIFLOZIN PROPANEDIOL 10 MG PO TABS: 10.0000 mg | ORAL_TABLET | Freq: Every day | ORAL | 4 refills | Status: DC

## 2020-10-02 MED ORDER — DIGOXIN 125 MCG PO TABS: 0.0625 mg | ORAL_TABLET | Freq: Every day | ORAL | 3 refills | Status: DC

## 2020-10-02 MED ORDER — SACUBITRIL-VALSARTAN 24-26 MG PO TABS: 1.0000 | ORAL_TABLET | Freq: Two times a day (BID) | ORAL | 4 refills | Status: DC

## 2020-10-02 NOTE — Patient Instructions (Signed)
Labs were done today, if any labs are abnormal the clinic will call you  Your physician recommends that you return for lab work in: 2 weeks appointment has been made  Your physician recommends that you schedule a follow-up appointment in: 4 weeks appointment has been made  Your physician recommends that you schedule a follow-up appointment in: 2-3 months with echocardiogram   START Farxiga 10 mg daily  At the Advanced Heart Failure Clinic, you and your health needs are our priority. As part of our continuing mission to provide you with exceptional heart care, we have created designated Provider Care Teams. These Care Teams include your primary Cardiologist (physician) and Advanced Practice Providers (APPs- Physician Assistants and Nurse Practitioners) who all work together to provide you with the care you need, when you need it.   You may see any of the following providers on your designated Care Team at your next follow up: Dr Arvilla Meres Dr Marca Ancona Dr Brandon Melnick, NP Robbie Lis, [place] Mikki Santee Karle Plumber, PharmD   Please be sure to bring in all your medications bottles to every appointment.    If you have any questions or concerns before your next appointment please send [place] a message through [place] or call our office at [phone].    TO LEAVE A MESSAGE FOR THE NURSE SELECT OPTION 2, PLEASE LEAVE A MESSAGE INCLUDING: YOUR NAME DATE OF BIRTH CALL BACK NUMBER REASON FOR CALL**this is important as we prioritize the call backs  YOU WILL RECEIVE A CALL BACK THE SAME DAY AS LONG AS YOU CALL BEFORE 4:00 PM

## 2020-10-02 NOTE — Progress Notes (Signed)
Paramedicine Encounter    Patient ID: Donald [name], male    DOB: 1960-02-22, 60 y.o.   MRN: [id number]    Saw Donald [name] in clinic today where he was seen by Prince Rome, NP.   Donald [name] had no new complaints.   EKG NSR, no afib noted.    Labs drawn today and will repeat in two weeks.   Med Changes- ADD FARXIGA DAILY  Follow up in 3-4 weeks with NP.   Dr. Gala Romney visit in a few months.   Monthly bubble packs will be completed on Friday and I will pick up next week for delivery and home visit.   Weight today- 271lbs   I will follow up in one week.   NOTE LABS- HOLD DIGOXIN FOR 3 DAYS. REDUCE TO LOWER DOSE.   ACTION: Home visit completed

## 2020-10-03 ENCOUNTER — Telehealth (HOSPITAL_COMMUNITY): Payer: Self-pay | Admitting: Cardiology

## 2020-10-03 DIAGNOSIS — I5022 Chronic systolic (congestive) heart failure: Secondary | ICD-10-CM

## 2020-10-03 NOTE — Telephone Encounter (Signed)
Patient called.  Patient aware.patient reports he will swing bu hf clinic to assist with medication changes Repeat labs 9/21

## 2020-10-03 NOTE — Telephone Encounter (Signed)
-----   Message from Jacklynn Ganong, [place] sent at 10/02/2020  2:06 PM EDT ----- Kidney function stable. Digoxin elevated. Hold dig for 3 days, resume at 0.0625 mg daily. Will need repeat dig level in 10 days

## 2020-10-09 ENCOUNTER — Telehealth (HOSPITAL_COMMUNITY): Payer: Self-pay

## 2020-10-09 NOTE — Telephone Encounter (Signed)
Spoke to Donald [name] who confirmed he has medications until Sunday, next months bubble packs will be delivered Friday. Confirmed with [facility] and patient. Call complete.

## 2020-10-10 ENCOUNTER — Other Ambulatory Visit (HOSPITAL_COMMUNITY): Payer: Self-pay | Admitting: *Deleted

## 2020-10-10 MED ORDER — SACUBITRIL-VALSARTAN 24-26 MG PO TABS: 1.0000 | ORAL_TABLET | Freq: Two times a day (BID) | ORAL | 4 refills | Status: AC

## 2020-10-11 ENCOUNTER — Other Ambulatory Visit (HOSPITAL_COMMUNITY): Payer: Self-pay

## 2020-10-14 ENCOUNTER — Encounter: Payer: Medicaid Other | Attending: Critical Care Medicine | Admitting: Dietician

## 2020-10-14 ENCOUNTER — Encounter: Payer: Self-pay | Admitting: Dietician

## 2020-10-14 ENCOUNTER — Telehealth (HOSPITAL_COMMUNITY): Payer: Self-pay

## 2020-10-14 ENCOUNTER — Other Ambulatory Visit: Payer: Self-pay

## 2020-10-14 DIAGNOSIS — E119 Type 2 diabetes mellitus without complications: Secondary | ICD-10-CM | POA: Insufficient documentation

## 2020-10-14 NOTE — Progress Notes (Signed)
Diabetes Self-Management Education  Visit Type: First/Initial  Appt. Start Time: 1400 Appt. End Time: 1500  10/14/2020  [name], identified by name and date of birth, is a 60 y.o. male with a diagnosis of Diabetes: Type 2.   ASSESSMENT  Pt is taking Farxiga for diabetes, no side effects. Pt reports checking FBG daily, today was 112. Pt reports history of stroke in 2018, states it changed their life dramatically. Pt reports systolic heart failure, pt is adherent to all cardiovascular medications.  Pt meets with cardiologist regularly. Pt is on food stamps, states they are having trouble affording food and nutrition supplements (Ensure).  RD provided samples of Ensure MAX protein, Glucerna Max, and discount coupons for both ONS. Pt states they spend most of their time with their aunt, and they provide plenty of food for pt.  Pt states their stove is currently broken, they will be looking for a replacement this week. [facility], does resistance exercises for their arms and legs. Pt states they lost muscle mass from hospitalization and wants to gain it back. Pt states they try not to eat after 5 PM, but reports getting hungry late at night and snacking. Pt drinks water to curb their appetite, drinks ~128 oz a day. Pt states that they feel "loopy" if they don't drink a lot of water due to frequent urination from medication.  Height 6' (1.829 m), weight 277 lb 11.2 oz (126 kg). Body mass index is 37.66 kg/m.   Diabetes Self-Management Education - 10/14/20 1428       Visit Information   Visit Type First/Initial      Initial Visit   Diabetes Type Type 2    Are you currently following a meal plan? Yes    What type of meal plan do you follow? fruit, vegetables, protein    Are you taking your medications as prescribed? Yes    Date Diagnosed 2014      Health Coping   How would you rate your overall health? Excellent      Psychosocial Assessment    Patient Belief/Attitude about Diabetes Motivated to manage diabetes    Self-care barriers Lack of transportation;Lack of material resources    Self-management support Doctor's office;Family    Other persons present Patient    Patient Concerns Nutrition/Meal planning    Special Needs Simplified materials    Preferred Learning Style No preference indicated    Learning Readiness Ready    How often do you need to have someone help you when you read instructions, pamphlets, or other written materials from your doctor or pharmacy? 1 - Never    What is the last grade level you completed in school? Associates      Pre-Education Assessment   Patient understands the diabetes disease and treatment process. Needs Instruction    Patient understands incorporating nutritional management into lifestyle. Needs Instruction    Patient undertands incorporating physical activity into lifestyle. Needs Instruction    Patient understands using medications safely. Needs Instruction    Patient understands monitoring blood glucose, interpreting and using results Needs Instruction    Patient understands prevention, detection, and treatment of acute complications. Needs Instruction    Patient understands prevention, detection, and treatment of chronic complications. Needs Instruction    Patient understands how to develop strategies to address psychosocial issues. Needs Instruction    Patient understands how to develop strategies to promote health/change behavior. Needs Instruction  Complications   Last HgB A1C per patient/outside source 7.2 %   08/09/2020   How often do you check your blood sugar? 1-2 times/day    Fasting Blood glucose range (mg/dL) 16-109    Have you had a dilated eye exam in the past 12 months? No    Have you had a dental exam in the past 12 months? No    Are you checking your feet? Yes    How many days per week are you checking your feet? 1      Dietary Intake   Breakfast 2 pieces of bacon,  blueberry pancake, 1 egg, whole milk    Snack (morning) none    Lunch 2 cheeseburgers, fruit punch    Snack (afternoon) none    Dinner none    Snack (evening) 2 donut sticks    Beverage(s) water      Exercise   Exercise Type ADL's;Moderate (swimming / aerobic walking)    How many days per week to you exercise? 2    How many minutes per day do you exercise? 40    Total minutes per week of exercise 80      Patient Education   Previous Diabetes Education No    Disease state  Definition of diabetes, type 1 and 2, and the diagnosis of diabetes;Factors that contribute to the development of diabetes    Nutrition management  Role of diet in the treatment of diabetes and the relationship between the three main macronutrients and blood glucose level;Food label reading, portion sizes and measuring food.    Physical activity and exercise  Role of exercise on diabetes management, blood pressure control and cardiac health.;Identified with patient nutritional and/or medication changes necessary with exercise.;Helped patient identify appropriate exercises in relation to his/her diabetes, diabetes complications and other health issue.    Medications Reviewed patients medication for diabetes, action, purpose, timing of dose and side effects.    Monitoring Purpose and frequency of SMBG.;Identified appropriate SMBG and/or A1C goals.    Chronic complications Relationship between chronic complications and blood glucose control;Lipid levels, blood glucose control and heart disease;Reviewed with patient heart disease, higher risk of, and prevention      Individualized Goals (developed by patient)   Nutrition Follow meal plan discussed;General guidelines for healthy choices and portions discussed    Physical Activity Exercise 1-2 times per week    Monitoring  test my blood glucose as discussed      Post-Education Assessment   Patient understands the diabetes disease and treatment process. Needs Review     Patient understands incorporating nutritional management into lifestyle. Needs Review    Patient undertands incorporating physical activity into lifestyle. Needs Review    Patient understands using medications safely. Needs Review    Patient understands monitoring blood glucose, interpreting and using results Needs Review    Patient understands prevention, detection, and treatment of acute complications. Needs Review    Patient understands prevention, detection, and treatment of chronic complications. Needs Review    Patient understands how to develop strategies to address psychosocial issues. Needs Review    Patient understands how to develop strategies to promote health/change behavior. Needs Review      Outcomes   Expected Outcomes Demonstrated interest in learning. Expect positive outcomes    Future DMSE 2 months    Program Status Not Completed             Individualized Plan for Diabetes Self-Management Training:   Learning Objective:  Patient will have  a greater understanding of diabetes self-management. Patient education plan is to attend individual and/or group sessions per assessed needs and concerns.   Plan:   Patient Instructions  Visit the [facility] on [address] for produce. They accept EBT, and will match up to $15 that you spend. Call the "Out of the Garden" project @ [phone]) [phone] to inquire about food assistance and produce. Visit [employer] for low cost, healthy food items.  Check your blood sugar every morning before you eat. Look for numbers between 70-100.  Discuss appropriate activity levels with your cardiologist before increasing your activity level. Increase your physical activity very gradually as your cardiologist allows.   When eating canned beans, be sure to drain and thoroughly rinse them to remove excess sodium.  Choose 1% or 2% milk instead of whole milk  Work towards eating three meals a day, about 5-6 hours  apart! Breakfast: 8:00 am Lunch: 1:00 pm Dinner: 6:00 pm  Begin to recognize carbohydrates in your food choices!  Begin to build your meals using the proportions of the Balanced Plate. First, select your carb choice(s) for the meal Next, select your source of protein to pair with your carb choice(s). Finally, complete the remaining half of your meal with a variety of non-starchy vegetables.   Expected Outcomes:  Demonstrated interest in learning. Expect positive outcomes  Education material provided: ADA - How to Thrive: A Guide for Your Journey with Diabetes, My Plate, Snack sheet, and Diabetes Resources  If problems or questions, patient to contact team via:  Phone and Email  Future DSME appointment: 2 months

## 2020-10-14 NOTE — Patient Instructions (Addendum)
Visit the [facility] [address] for produce. They accept EBT, and will match up to $15 that you spend. Call the "Out of the Garden" project @ [phone] to inquire about food assistance and produce. Visit [employer] for low cost, healthy food items.  Check your blood sugar every morning before you eat. Look for numbers between 70-100.  Discuss appropriate activity levels with your cardiologist before increasing your activity level. Increase your physical activity very gradually as your cardiologist allows.   When eating canned beans, be sure to drain and thoroughly rinse them to remove excess sodium.  Choose 1% or 2% milk instead of whole milk  Work towards eating three meals a day, about 5-6 hours apart! Breakfast: 8:00 am Lunch: 1:00 pm Dinner: 6:00 pm  Begin to recognize carbohydrates in your food choices!  Begin to build your meals using the proportions of the Balanced Plate. First, select your carb choice(s) for the meal Next, select your source of protein to pair with your carb choice(s). Finally, complete the remaining half of your meal with a variety of non-starchy vegetables.

## 2020-10-14 NOTE — Telephone Encounter (Signed)
Spoke to [name] who I reminded about his appointments for Wednesday, confirmed ride with [facility] for 1:19 pick up time. I will meet [name] at his lab appointment to coordinate his transport to PCP at [facility] after.   [name] also requested a list of eye doctors who accept medicaid. I will have list for him on [place].   Call complete.

## 2020-10-16 ENCOUNTER — Ambulatory Visit (HOSPITAL_COMMUNITY)
Admission: RE | Admit: 2020-10-16 | Discharge: 2020-10-16 | Disposition: A | Discharge status: Home / Self Care | Payer: Medicaid Other | Source: Ambulatory Visit | Attending: Cardiology | Admitting: Cardiology

## 2020-10-16 ENCOUNTER — Ambulatory Visit: Payer: Medicaid Other | Attending: Nurse Practitioner | Admitting: Nurse Practitioner

## 2020-10-16 ENCOUNTER — Other Ambulatory Visit: Payer: Self-pay

## 2020-10-16 ENCOUNTER — Encounter: Payer: Self-pay | Admitting: Nurse Practitioner

## 2020-10-16 VITALS — BP 121/85 | HR 90 | Ht 72.0 in | Wt 280.5 lb

## 2020-10-16 DIAGNOSIS — I5022 Chronic systolic (congestive) heart failure: Secondary | ICD-10-CM | POA: Diagnosis not present

## 2020-10-16 DIAGNOSIS — Z1211 Encounter for screening for malignant neoplasm of colon: Secondary | ICD-10-CM

## 2020-10-16 DIAGNOSIS — I110 Hypertensive heart disease with heart failure: Secondary | ICD-10-CM | POA: Diagnosis present

## 2020-10-16 DIAGNOSIS — I10 Essential (primary) hypertension: Secondary | ICD-10-CM | POA: Diagnosis not present

## 2020-10-16 DIAGNOSIS — E1165 Type 2 diabetes mellitus with hyperglycemia: Secondary | ICD-10-CM | POA: Diagnosis not present

## 2020-10-16 DIAGNOSIS — Z1159 Encounter for screening for other viral diseases: Secondary | ICD-10-CM | POA: Diagnosis not present

## 2020-10-16 DIAGNOSIS — Z7689 Persons encountering health services in other specified circumstances: Secondary | ICD-10-CM

## 2020-10-16 LAB — CBC
HCT: 45.5 % (ref 39.0–52.0)
Hemoglobin: 14.3 g/dL (ref 13.0–17.0)
MCH: 25.8 pg — ABNORMAL LOW (ref 26.0–34.0)
MCHC: 31.4 g/dL (ref 30.0–36.0)
MCV: 82 fL (ref 80.0–100.0)
Platelets: 226 10*3/uL (ref 150–400)
RBC: 5.55 MIL/uL (ref 4.22–5.81)
RDW: 17.3 % — ABNORMAL HIGH (ref 11.5–15.5)
WBC: 7.6 10*3/uL (ref 4.0–10.5)
nRBC: 0 % (ref 0.0–0.2)

## 2020-10-16 LAB — DIGOXIN LEVEL: Digoxin Level: 0.3 ng/mL — ABNORMAL LOW (ref 0.8–2.0)

## 2020-10-16 LAB — BASIC METABOLIC PANEL
Anion gap: 9 (ref 5–15)
BUN: 13 mg/dL (ref 6–20)
CO2: 31 mmol/L (ref 22–32)
Calcium: 9.2 mg/dL (ref 8.9–10.3)
Chloride: 96 mmol/L — ABNORMAL LOW (ref 98–111)
Creatinine, Ser: 1.24 mg/dL (ref 0.61–1.24)
GFR, Estimated: >60 mL/min (ref >60)
Glucose, Bld: 218 mg/dL — ABNORMAL HIGH (ref 70–99)
Potassium: 4.2 mmol/L (ref 3.5–5.1)
Sodium: 136 mmol/L (ref 135–145)

## 2020-10-16 NOTE — Progress Notes (Signed)
Assessment & Plan:  Zylon was seen today for establish care.  Diagnoses and all orders for this visit:  Encounter to establish care  Type 2 diabetes mellitus with hyperglycemia, without long-term current use of insulin ([place]) -     Hemoglobin A1c; Future -     Ambulatory referral to Ophthalmology Continue blood sugar control as discussed in office today, low carbohydrate diet, and regular physical exercise as tolerated, 150 minutes per week (30 min each day, 5 days per week, or 50 min 3 days per week). Keep blood sugar logs with fasting goal of 90-130 mg/dl, post prandial (after you eat) less than 180.  For Hypoglycemia: BS <60 and Hyperglycemia BS >400; contact the clinic ASAP. Annual eye exams and foot exams are recommended.   Essential hypertension Continue all antihypertensives as prescribed.  Remember to bring in your blood pressure log with you for your follow up appointment.  DASH/Mediterranean Diets are healthier choices for HTN.    Need for hepatitis C screening test -     HCV Ab w Reflex to Quant PCR; Future  Colon cancer screening -     Ambulatory referral to Gastroenterology   Patient has been counseled on age-appropriate routine health concerns for screening and prevention. These are reviewed and up-to-date. Referrals have been placed accordingly. Immunizations are up-to-date or declined.    Subjective:   Chief Complaint  Patient presents with   Establish Care   HPI MELVYN HOMMES 60 y.o. male presents to office today to establish care. PMH: Afib, aflutter, chronic combined systolic and diastolic heart failure, submassive B/L pulmonary embolism, COPD, hypertension, stroke, hyperlipidemia  He is currently being followed by  Cardiology for afib/flutter. S/p cardioversion 09-02-2020. Currently remains in NSR. Continues on eliquis 5 mg BID.    DM 2 Well controlled with farxiga 10 mg daily. He denies any symptoms of hypo or hyperglycemia. LDL at goal with  atorvastatin 20 mg daily.  Lab Results  Component Value Date   HGBA1C 7.2 (H) 08/09/2020   Lab Results  Component Value Date   LDLCALC 56 03/08/2020     HTN Blood pressure is well controlled. HR well controlled today. He is taking the following medications: amiodarone 200 mg daily, digoxin 0.0625 mg daily, entresto 24-26 mg daily, torsemide 40 mg BID and spironolactone 25 mg daily  BP Readings from Last 3 Encounters:  10/16/20 121/85  10/02/20 (!) 148/87  09/24/20 128/82    Review of Systems  Constitutional:  Negative for fever, malaise/fatigue and weight loss.  HENT: Negative.  Negative for nosebleeds.   Eyes: Negative.  Negative for blurred vision, double vision and photophobia.  Respiratory: Negative.  Negative for cough and shortness of breath.   Cardiovascular: Negative.  Negative for chest pain, palpitations and leg swelling.  Gastrointestinal: Negative.  Negative for heartburn, nausea and vomiting.  Musculoskeletal: Negative.  Negative for myalgias.  Neurological: Negative.  Negative for dizziness, focal weakness, seizures and headaches.  Psychiatric/Behavioral: Negative.  Negative for suicidal ideas.    Past Medical History:  Diagnosis Date   Arrhythmia    A fib    Atrial fibrillation (HCC)    CHF (congestive heart failure) (HCC)    COPD (chronic obstructive pulmonary disease) ([place])    Diabetes mellitus without complication ([place])    Hypertension    Sleep apnea    Stroke [facility])     Past Surgical History:  Procedure Laterality Date   ABLATION     CARDIOVERSION N/A 03/12/2020   Procedure:  CARDIOVERSION;  Surgeon: Chilton Si, MD;  Location: [facility] ENDOSCOPY;  Service: Cardiovascular;  Laterality: N/A;   CARDIOVERSION N/A 08/13/2020   Procedure: CARDIOVERSION;  Surgeon: Dolores Patty, MD;  Location: [facility] ENDOSCOPY;  Service: Cardiovascular;  Laterality: N/A;   CARDIOVERSION N/A 09/02/2020   Procedure: CARDIOVERSION;  Surgeon: Parke Poisson, MD;  Location: [facility]  ENDOSCOPY;  Service: Cardiovascular;  Laterality: N/A;   INCISION AND DRAINAGE PERIRECTAL ABSCESS N/A 10/20/2012   Procedure: IRRIGATION AND DEBRIDEMENT PERIRECTAL ABSCESS;  Surgeon: Wilmon Arms. Corliss Skains, MD;  Location: [facility] OR;  Service: General;  Laterality: N/A;  Lithotomy   RIGHT/LEFT HEART CATH AND CORONARY ANGIOGRAPHY N/A 08/12/2020   Procedure: RIGHT/LEFT HEART CATH AND CORONARY ANGIOGRAPHY;  Surgeon: Dolores Patty, MD;  Location: [facility] INVASIVE CV LAB;  Service: Cardiovascular;  Laterality: N/A;   TEE WITHOUT CARDIOVERSION N/A 03/12/2020   Procedure: TRANSESOPHAGEAL ECHOCARDIOGRAM (TEE);  Surgeon: Chilton Si, MD;  Location: [facility] ENDOSCOPY;  Service: Cardiovascular;  Laterality: N/A;   TEE WITHOUT CARDIOVERSION N/A 08/13/2020   Procedure: TRANSESOPHAGEAL ECHOCARDIOGRAM (TEE);  Surgeon: Dolores Patty, MD;  Location: [facility] ENDOSCOPY;  Service: Cardiovascular;  Laterality: N/A;    Family History  Problem Relation Age of Onset   Breast cancer Mother    Heart attack Father 28   Heart attack Paternal Uncle 4    Social History Reviewed with no changes to be made today.   Outpatient Medications Prior to Visit  Medication Sig Dispense Refill   Accu-Chek Softclix Lancets lancets Use as directed 100 each 0   albuterol (PROVENTIL HFA;VENTOLIN HFA) 108 (90 Base) MCG/ACT inhaler Inhale 2 puffs into the lungs every 4 (four) hours as needed for shortness of breath.     albuterol (PROVENTIL) (2.5 MG/3ML) 0.083% nebulizer solution Take 3 mLs (2.5 mg total) by nebulization every 4 (four) hours as needed for wheezing or shortness of breath. 30 vial 0   amiodarone (PACERONE) 200 MG tablet Take 1 tablet (200 mg total) by mouth daily. 90 tablet 3   apixaban (ELIQUIS) 5 MG TABS tablet Take 1 tablet (5 mg total) by mouth 2 (two) times daily. 60 tablet 3   atorvastatin (LIPITOR) 20 MG tablet Take 1 tablet (20 mg total) by mouth daily. 30 tablet 3   Blood Glucose Monitoring Suppl (BLOOD GLUCOSE MONITOR  SYSTEM) w/Device KIT USE AS DIRECTED 1 kit 0   budesonide-formoterol (SYMBICORT) 80-4.5 MCG/ACT inhaler Inhale 2 puffs into the lungs in the morning and at bedtime. 10.2 g 0   dapagliflozin propanediol (FARXIGA) 10 MG TABS tablet Take 1 tablet (10 mg total) by mouth daily before breakfast. 30 tablet 4   digoxin (LANOXIN) 0.125 MG tablet Take 0.5 tablets (0.0625 mg total) by mouth daily. 30 tablet 3   glucose blood test strip Use as directed 100 each 0   mometasone-formoterol (DULERA) 100-5 MCG/ACT AERO Inhale 1 puff into the lungs daily.     sacubitril-valsartan (ENTRESTO) 24-26 MG Take 1 tablet by mouth 2 (two) times daily. 60 tablet 4   spironolactone (ALDACTONE) 25 MG tablet Take 1 tablet (25 mg total) by mouth daily. 30 tablet 3   torsemide (DEMADEX) 20 MG tablet Take 2 tablets (40 mg total) by mouth 2 (two) times daily. 120 tablet 6   No facility-administered medications prior to visit.    No Known Allergies     Objective:    BP 121/85   Pulse 90   Ht 6' (1.829 m)   Wt 280 lb 8 oz (127.2 kg)  SpO2 95%   BMI 38.04 kg/m  Wt Readings from Last 3 Encounters:  10/16/20 280 lb 8 oz (127.2 kg)  10/14/20 277 lb 11.2 oz (126 kg)  10/02/20 271 lb 9.6 oz (123.2 kg)    Physical Exam Vitals and nursing note reviewed.  Constitutional:      Appearance: He is well-developed.  HENT:     Head: Normocephalic and atraumatic.  Cardiovascular:     Rate and Rhythm: Normal rate and regular rhythm.     Heart sounds: Normal heart sounds. No murmur heard.   No friction rub. No gallop.  Pulmonary:     Effort: Pulmonary effort is normal. No tachypnea or respiratory distress.     Breath sounds: Normal breath sounds. No decreased breath sounds, wheezing, rhonchi or rales.  Chest:     Chest wall: No tenderness.  Abdominal:     General: Bowel sounds are normal.     Palpations: Abdomen is soft.  Musculoskeletal:        General: Normal range of motion.     Cervical back: Normal range of motion.   Skin:    General: Skin is warm and dry.  Neurological:     Mental Status: He is alert and oriented to person, place, and time.     Coordination: Coordination normal.  Psychiatric:        Behavior: Behavior normal. Behavior is cooperative.        Thought Content: Thought content normal.        Judgment: Judgment normal.         Patient has been counseled extensively about nutrition and exercise as well as the importance of adherence with medications and regular follow-up. The patient was given clear instructions to go to ER or return to medical center if symptoms don't improve, worsen or new problems develop. The patient verbalized understanding.   Follow-up: Return for labs for october 15th. See me in 3 months .   Gildardo Pounds, FNP-BC [facility] [facility] [employer], [place]   10/20/2020, 1:26 PM

## 2020-10-18 ENCOUNTER — Ambulatory Visit: Payer: Medicaid Other | Attending: Nurse Practitioner | Admitting: *Deleted

## 2020-10-18 ENCOUNTER — Other Ambulatory Visit: Payer: Self-pay

## 2020-10-18 DIAGNOSIS — Z23 Encounter for immunization: Secondary | ICD-10-CM

## 2020-10-20 ENCOUNTER — Encounter: Payer: Self-pay | Admitting: Nurse Practitioner

## 2020-10-24 ENCOUNTER — Other Ambulatory Visit: Payer: Self-pay | Admitting: Obstetrics and Gynecology

## 2020-10-24 NOTE — Patient Instructions (Signed)
Hi Donald [name], I am sorry I missed you today, hope you are doing well. - as a part of your Medicaid benefit, you are eligible for care management and care coordination services at no cost or copay. I was unable to reach you by phone today but would be happy to help you with your health related needs. Please feel free to call me at [phone].  A member of the Managed Medicaid care management team will reach out to you again over the next 7-14 days.   Kathi Der RN, BSN [place]  [employer], production - Managed Medicaid High Risk [phone]

## 2020-10-24 NOTE — Patient Outreach (Signed)
Care Coordination  10/24/2020  Donald [name] 1960/07/16 [id number]   Medicaid Managed Care   Unsuccessful Outreach Note  10/24/2020 Name: Donald [name] MRN: [id number] DOB: 1960-09-30  Referred by: Claiborne Rigg, NP Reason for referral : High Risk Managed Medicaid (Unsuccessful telephone outreach)   An unsuccessful telephone outreach was attempted today. The patient was referred to the case management team for assistance with care management and care coordination.   Follow Up Plan: The care management team will reach out to the patient again over the next 7-14 days.   Kathi Der RN, BSN [place]  [employer], [employer] - Managed Medicaid High Risk [phone].

## 2020-10-28 NOTE — Progress Notes (Signed)
Advanced Heart Failure Clinic Note   Primary Care: Dr Criss Rosales  Primary Cardiologist: Dr Minna Merritts  HF Cardiologist: Dr Haroldine Laws  HPI: [name] is a 60 y.o.with a history of a fib, A fib ablation, CVA 2018, COPD, OSA, HTN, obesity, and chronic systolic heart failure.  Looks like he has been taking his medications intermittently.    Admitted 12/2019 at [facility] with A/C systolic heart failure and A fib RVR. Diuresed and transitioned back to po lasix.  There was concern about medication compliance.    Followed by Dr Minna Merritts for heart failure and was last seen 01/30/20. At that time he was running out of his medications. HF meds refilled. He had also planned to obtain cardioversion.   Admitted 03/07/20 with increased shortness of breath. CTA concerning for acute bilateral PE and A fib RVR. Had successful DCCV on 03/12/20. Discharged on lasix 40 mg daily, carvedilol 12.5 mg bid, Eliquis, and spironolactone 25 mg daily.   Admitted 08/06/20 with chest pain, A fib RVR, acute PE, and A/C systolic heart failure. CT chest showed acute bilateral submassive PE w/ RV strain. He had not been taking Eliquis. Anticoagulated with heparin drip followed by Eliquis.   Lower extremity dopplers negative. Echo showed EF down to 30-35% with RV moderately reduced. Diuresed with IV lasix and placed on amio drip. Once stabilized, had LHC/RHC with nonobstructive CAD, low normal CO, and EF 25%.  He later had TEE with successful cardioversion. He was discharged on Eliquis and given meds prior to discharge. He was referred to HF paramedicine   At post hospital follow up, he was back in atrial flutter, but volume was OK. Amio was increased and he was referred to EP for ablation.  S/p DCCV 8/22 to SB. Seen by EP and plan for ablation in 6 months; amio reduced due to finger tingling.  Today he returns for HF follow up. Feeling OK, going to the gym 2x/week and walking some around the block without much SOB. Denies CP,  dizziness, edema, or PND/Orthopnea. Appetite ok. No fever or chills. Weight at home 279 pounds. Taking all medications. He does not have a license due to DUI x 2. Uses food stamps. Lives alone. He is disabled. He uses his scooter or [facility] to get to appointments.  Cardiac Testing  - DCCV 09/02/20-->SB - DC-CV 08/13/20 --200 j x1--> NSR  - DC-CV 03/13/2019--->NSR   - Had A flutter ablation in 2019   - L [facility] 08/12/2020:  . Mid RCA lesion is 60% stenosed.   Prox RCA lesion is 30% stenosed.   RPDA lesion is 30% stenosed.   1st Mrg lesion is 30% stenosed.   Prox LAD to Mid LAD lesion is 20% stenosed.   2nd Diag lesion is 40% stenosed.   The left ventricular ejection fraction is less than 25% by visual estimate.  Findings:   Ao = 109/81 (93)  LV = 102/9  RA = 7  RV = 46/4  PA = 49/17 (32)  PCW = 16 (v = 25)  Fick cardiac output/index = 5.5/2.3  PVR = 2.9  Ao sat = 97%  PA sat = 67%,68%   - TEE 08/13/2020 EF 20% + smoke no clot  - Echo 07/2020 EF down 30-35% with RV moerately reduced  - Echo 03/08/2020 EF 40-45% RV mildly reduced, RA/LA severely dilated, mild-mod MV regurgitatio  - CTA 03/08/2020: Partially occlusive thrombus within segmental and subsegmental arterial branches within the bilateral lungs as described above. No  evidence of right ventricular heart strain. Small right pleural effusion. Bilateral patchy ground-glass opacity seen within both lung bases right greater than left which could be due to atelectasis, or infectious etiology.  - Nuclear Stress Test 2018  1. No reversible ischemia or infarction.  2. Mild generalized hypokinesis. No focal left ventricular wall  motion defect beyond mild generalized hypokinesis.  3. Left ventricular ejection fraction 43%    Past Medical History:  Diagnosis Date   Arrhythmia    A fib    Atrial fibrillation (HCC)    CHF (congestive heart failure) (HCC)    COPD (chronic obstructive pulmonary disease) (HCC)    Diabetes  mellitus without complication (HCC)    Hypertension    Sleep apnea    Stroke [facility])    Current Outpatient Medications  Medication Sig Dispense Refill   Accu-Chek Softclix Lancets lancets Use as directed 100 each 0   albuterol (PROVENTIL HFA;VENTOLIN HFA) 108 (90 Base) MCG/ACT inhaler Inhale 2 puffs into the lungs every 4 (four) hours as needed for shortness of breath.     albuterol (PROVENTIL) (2.5 MG/3ML) 0.083% nebulizer solution Take 3 mLs (2.5 mg total) by nebulization every 4 (four) hours as needed for wheezing or shortness of breath. 30 vial 0   amiodarone (PACERONE) 200 MG tablet Take 1 tablet (200 mg total) by mouth daily. 90 tablet 3   apixaban (ELIQUIS) 5 MG TABS tablet Take 1 tablet (5 mg total) by mouth 2 (two) times daily. 60 tablet 3   atorvastatin (LIPITOR) 20 MG tablet Take 1 tablet (20 mg total) by mouth daily. 30 tablet 3   Blood Glucose Monitoring Suppl (BLOOD GLUCOSE MONITOR SYSTEM) w/Device KIT USE AS DIRECTED 1 kit 0   budesonide-formoterol (SYMBICORT) 80-4.5 MCG/ACT inhaler Inhale 2 puffs into the lungs in the morning and at bedtime. 10.2 g 0   dapagliflozin propanediol (FARXIGA) 10 MG TABS tablet Take 1 tablet (10 mg total) by mouth daily before breakfast. 30 tablet 4   digoxin (LANOXIN) 0.125 MG tablet Take 0.5 tablets (0.0625 mg total) by mouth daily. 30 tablet 3   glucose blood test strip Use as directed 100 each 0   mometasone-formoterol (DULERA) 100-5 MCG/ACT AERO Inhale 1 puff into the lungs daily.     sacubitril-valsartan (ENTRESTO) 24-26 MG Take 1 tablet by mouth 2 (two) times daily. 60 tablet 4   spironolactone (ALDACTONE) 25 MG tablet Take 1 tablet (25 mg total) by mouth daily. 30 tablet 3   torsemide (DEMADEX) 20 MG tablet Take 2 tablets (40 mg total) by mouth 2 (two) times daily. 120 tablet 6   No current facility-administered medications for this encounter.   No Known Allergies  Social History   Socioeconomic History   Marital status: Single     Spouse name: Not on file   Number of children: Not on file   Years of education: Not on file   Highest education level: Not on file  Occupational History   Not on file  Tobacco Use   Smoking status: Former    Packs/day: 0.50    Types: Cigarettes    Quit date: 2018    Years since quitting: 4.7   Smokeless tobacco: Never  Substance and Sexual Activity   Alcohol use: Not Currently   Drug use: Not Currently    Types: Cocaine   Sexual activity: Not Currently  Other Topics Concern   Not on file  Social History Narrative   Not on file   Social Determinants of  Health   Financial Resource Strain: Medium Risk   Difficulty of Paying Living Expenses: Somewhat hard  Food Insecurity: Food Insecurity Present   Worried About Running Out of Food in the Last Year: Never true   Ran Out of Food in the Last Year: Sometimes true  Transportation Needs: Unmet Transportation Needs   Lack of Transportation (Medical): Yes   Lack of Transportation (Non-Medical): Yes  Physical Activity: Inactive   Days of Exercise per Week: 0 days   Minutes of Exercise per Session: 0 min  Stress: Not on file  Social Connections: Moderately Isolated   Frequency of Communication with Friends and Family: More than three times a week   Frequency of Social Gatherings with Friends and Family: More than three times a week   Attends Religious Services: 1 to 4 times per year   Active Member of [employer] or Organizations: No   Attends Archivist Meetings: Never   Marital Status: Never married  Human resources officer Violence: Not At Risk   Fear of Current or Ex-Partner: No   Emotionally Abused: No   Physically Abused: No   Sexually Abused: No   Family History  Problem Relation Age of Onset   Breast cancer Mother    Heart attack Father 55   Heart attack Paternal Uncle 72   BP (!) 132/100   Pulse 87   Wt 127.4 kg (280 lb 12.8 oz)   SpO2 94%   BMI 38.08 kg/m   Wt Readings from Last 3 Encounters:  10/30/20 127.4  kg (280 lb 12.8 oz)  10/16/20 127.2 kg (280 lb 8 oz)  10/14/20 126 kg (277 lb 11.2 oz)   PHYSICAL EXAM: General:  NAD. No resp difficulty HEENT: Normal Neck: Supple. No JVD. Carotids 2+ bilat; no bruits. No lymphadenopathy or thryomegaly appreciated. Cor: PMI nondisplaced. Regular rate & rhythm. No rubs, gallops or murmurs. Lungs: Clear Abdomen: Soft, nontender, nondistended. No hepatosplenomegaly. No bruits or masses. Good bowel sounds. Extremities: No cyanosis, clubbing, rash, edema Neuro: Alert & oriented x 3, cranial nerves grossly intact. Moves all 4 extremities w/o difficulty. Affect pleasant.  ASSESSMENT & PLAN: 1. Chronic Systolic Heart Failure, NICM.  - LHC [facility] 07/2020 non obs CAD, low normal cardiac output.  - Echo 02/2020 EF 40-45% -->ECHO 07/2020 EF down 30-35% with RV moderately reduced, and RA/LA moderately dilated. RA 15. - NYHA II, volume good today. - Start carvedilol 3.125 mg bid. - Increase Entresto to 49/51 mg bid. (Given samples of 24/26 to add to bubble packs until he gets new bubble packs with increased dose.) - Continue Farxiga 10 mg daily. - Continue torsemide 40 mg bid.  - Continue digoxin 0.125 mg daily. - Continue spironolactone 25 mg daily. - BMET & dig level today; repeat BMET in 10 days.  2. PAF - Had ablation in 2019 at [facility].  - DCCV 7/22 with restoration of NSR.  - Back AFL 7/22 at hospital follow up. - s/p DCCV 09/02/20 to SB. Regular on exam today. - EP plans on ablation ~6 months. - Continue Eliquis 5 mg bid. No bleeding issues. - Continue amio 200 mg daily. Hopefully can stop after ablation.  3. OSA - Sleep study ordered.   4. Recurrent PE  - H/O PE back in February 2022. He was not taking Eliquis. - CTA 07/2020 showed acute bilateral submassive PE. L/E [place] negative -> No indication for filter placement.  - Continue Eliquis.   5. SDOH - He has been referred to [facility] for PCP. - Appreciate paramedicine's  assistance.  Follow up with Dr. Haroldine Laws + echo in 3 months.  Allena Katz, FNP-BC 10/30/20

## 2020-10-30 ENCOUNTER — Other Ambulatory Visit: Payer: Self-pay

## 2020-10-30 ENCOUNTER — Other Ambulatory Visit (HOSPITAL_COMMUNITY): Payer: Self-pay | Admitting: *Deleted

## 2020-10-30 ENCOUNTER — Ambulatory Visit (HOSPITAL_COMMUNITY)
Admission: RE | Admit: 2020-10-30 | Discharge: 2020-10-30 | Disposition: A | Discharge status: Home / Self Care | Payer: Medicaid Other | Source: Ambulatory Visit | Attending: Family Medicine | Admitting: Family Medicine

## 2020-10-30 ENCOUNTER — Encounter (HOSPITAL_COMMUNITY): Payer: Self-pay

## 2020-10-30 ENCOUNTER — Other Ambulatory Visit (HOSPITAL_COMMUNITY): Payer: Self-pay

## 2020-10-30 VITALS — BP 132/100 | HR 87 | Wt 280.8 lb

## 2020-10-30 DIAGNOSIS — Z86711 Personal history of pulmonary embolism: Secondary | ICD-10-CM | POA: Diagnosis not present

## 2020-10-30 DIAGNOSIS — I2510 Atherosclerotic heart disease of native coronary artery without angina pectoris: Secondary | ICD-10-CM | POA: Insufficient documentation

## 2020-10-30 DIAGNOSIS — Z8673 Personal history of transient ischemic attack (TIA), and cerebral infarction without residual deficits: Secondary | ICD-10-CM | POA: Diagnosis not present

## 2020-10-30 DIAGNOSIS — G4733 Obstructive sleep apnea (adult) (pediatric): Secondary | ICD-10-CM | POA: Insufficient documentation

## 2020-10-30 DIAGNOSIS — I2699 Other pulmonary embolism without acute cor pulmonale: Secondary | ICD-10-CM

## 2020-10-30 DIAGNOSIS — Z7901 Long term (current) use of anticoagulants: Secondary | ICD-10-CM | POA: Insufficient documentation

## 2020-10-30 DIAGNOSIS — Z7984 Long term (current) use of oral hypoglycemic drugs: Secondary | ICD-10-CM | POA: Diagnosis not present

## 2020-10-30 DIAGNOSIS — Z79899 Other long term (current) drug therapy: Secondary | ICD-10-CM | POA: Insufficient documentation

## 2020-10-30 DIAGNOSIS — I480 Paroxysmal atrial fibrillation: Secondary | ICD-10-CM | POA: Diagnosis not present

## 2020-10-30 DIAGNOSIS — I5022 Chronic systolic (congestive) heart failure: Secondary | ICD-10-CM | POA: Insufficient documentation

## 2020-10-30 DIAGNOSIS — Z8249 Family history of ischemic heart disease and other diseases of the circulatory system: Secondary | ICD-10-CM | POA: Diagnosis not present

## 2020-10-30 DIAGNOSIS — I4891 Unspecified atrial fibrillation: Secondary | ICD-10-CM

## 2020-10-30 DIAGNOSIS — Z7951 Long term (current) use of inhaled steroids: Secondary | ICD-10-CM | POA: Diagnosis not present

## 2020-10-30 DIAGNOSIS — I5043 Acute on chronic combined systolic (congestive) and diastolic (congestive) heart failure: Secondary | ICD-10-CM

## 2020-10-30 DIAGNOSIS — I4892 Unspecified atrial flutter: Secondary | ICD-10-CM | POA: Insufficient documentation

## 2020-10-30 DIAGNOSIS — Z87891 Personal history of nicotine dependence: Secondary | ICD-10-CM | POA: Insufficient documentation

## 2020-10-30 DIAGNOSIS — I428 Other cardiomyopathies: Secondary | ICD-10-CM | POA: Diagnosis not present

## 2020-10-30 DIAGNOSIS — Z139 Encounter for screening, unspecified: Secondary | ICD-10-CM

## 2020-10-30 DIAGNOSIS — I484 Atypical atrial flutter: Secondary | ICD-10-CM

## 2020-10-30 DIAGNOSIS — I110 Hypertensive heart disease with heart failure: Secondary | ICD-10-CM | POA: Insufficient documentation

## 2020-10-30 LAB — BASIC METABOLIC PANEL
Anion gap: 12 (ref 5–15)
BUN: 19 mg/dL (ref 6–20)
CO2: 28 mmol/L (ref 22–32)
Calcium: 9.1 mg/dL (ref 8.9–10.3)
Chloride: 96 mmol/L — ABNORMAL LOW (ref 98–111)
Creatinine, Ser: 1.19 mg/dL (ref 0.61–1.24)
GFR, Estimated: >60 mL/min (ref >60)
Glucose, Bld: 126 mg/dL — ABNORMAL HIGH (ref 70–99)
Potassium: 3.4 mmol/L — ABNORMAL LOW (ref 3.5–5.1)
Sodium: 136 mmol/L (ref 135–145)

## 2020-10-30 LAB — DIGOXIN LEVEL: Digoxin Level: <0.2 ng/mL — ABNORMAL LOW (ref 0.8–2.0)

## 2020-10-30 MED ORDER — CARVEDILOL 3.125 MG PO TABS: 3.1250 mg | ORAL_TABLET | Freq: Two times a day (BID) | ORAL | 3 refills | Status: DC

## 2020-10-30 MED ORDER — ENTRESTO 49-51 MG PO TABS: 1.0000 | ORAL_TABLET | Freq: Two times a day (BID) | ORAL | 3 refills | Status: DC

## 2020-10-30 MED ORDER — CARVEDILOL 3.125 MG PO TABS
3.1250 mg | ORAL_TABLET | Freq: Two times a day (BID) | ORAL | 0 refills | Status: DC
  Filled 2020-10-30 (×2): qty 60, 30d supply, fill #0

## 2020-10-30 NOTE — Patient Instructions (Signed)
1.Start Coreg 3.125 mg twice daily. One month supply Rx sent to [facility]. The remaining Rx sent to [facility] Herbert Seta will help with this one). 2. Increase Entresto to 49/51 twice daily. - Rx sent to [facility]. Samples given today of 24/26 mg dose. Will take two of these twice daily until Herbert Seta is back to help.  3. Return to Heart Failure Lab on 11/12/20 at 9:30 for repeat lab.  4. Return in 3 months to see Dr. Gala Romney with echo. We will call you and schedule this appointment.

## 2020-10-31 ENCOUNTER — Other Ambulatory Visit: Payer: Self-pay

## 2020-10-31 ENCOUNTER — Telehealth (HOSPITAL_COMMUNITY): Payer: Self-pay

## 2020-10-31 ENCOUNTER — Ambulatory Visit: Payer: Medicaid Other

## 2020-10-31 MED ORDER — POTASSIUM CHLORIDE CRYS ER 20 MEQ PO TBCR: 20.0000 meq | EXTENDED_RELEASE_TABLET | Freq: Every day | ORAL | 3 refills | Status: DC

## 2020-10-31 NOTE — Telephone Encounter (Addendum)
Patient advised and verbalized understanding.  New Rx sent into patients pharmacy  Meds ordered this encounter  Medications   potassium chloride SA (KLOR-CON M20) 20 MEQ tablet    Sig: Take 1 tablet (20 mEq total) by mouth daily.    Dispense:  90 tablet    Refill:  3

## 2020-10-31 NOTE — Telephone Encounter (Signed)
-----   Message from Jacklynn Ganong, [place] sent at 10/30/2020  4:22 PM EDT ----- K is low. Please have him start 20 mEq daily. Repeat bmet scheduled.

## 2020-11-08 ENCOUNTER — Ambulatory Visit: Payer: Medicaid Other | Attending: Nurse Practitioner

## 2020-11-08 ENCOUNTER — Other Ambulatory Visit: Payer: Self-pay

## 2020-11-08 DIAGNOSIS — Z1159 Encounter for screening for other viral diseases: Secondary | ICD-10-CM

## 2020-11-08 DIAGNOSIS — E1165 Type 2 diabetes mellitus with hyperglycemia: Secondary | ICD-10-CM

## 2020-11-09 LAB — HCV INTERPRETATION

## 2020-11-09 LAB — HCV AB W REFLEX TO QUANT PCR: HCV Ab: <0.1 s/co ratio (ref 0.0–0.9)

## 2020-11-09 LAB — HEMOGLOBIN A1C
Est. average glucose Bld gHb Est-mCnc: 192 mg/dL
Hgb A1c MFr Bld: 8.3 % — ABNORMAL HIGH (ref 4.8–5.6)

## 2020-11-11 ENCOUNTER — Other Ambulatory Visit: Payer: Self-pay

## 2020-11-11 ENCOUNTER — Ambulatory Visit: Payer: Medicaid Other | Attending: Internal Medicine

## 2020-11-11 ENCOUNTER — Other Ambulatory Visit (HOSPITAL_BASED_OUTPATIENT_CLINIC_OR_DEPARTMENT_OTHER): Payer: Self-pay

## 2020-11-11 DIAGNOSIS — Z23 Encounter for immunization: Secondary | ICD-10-CM

## 2020-11-11 MED ORDER — PFIZER COVID-19 VAC BIVALENT 30 MCG/0.3ML IM SUSP
INTRAMUSCULAR | 0 refills | Status: DC
  Filled 2020-11-11: qty 0.3, 1d supply, fill #0

## 2020-11-11 NOTE — Progress Notes (Signed)
   Covid-19 Vaccination Clinic  Name:  IZZY COURVILLE    MRN: [id number] DOB: Jun 01, 1960  11/11/2020  Mr. Bonadonna was observed post Covid-19 immunization for 15 minutes without incident. He was provided with Vaccine Information Sheet and instruction to access the V-Safe system.   Mr. Colter was instructed to call 911 with any severe reactions post vaccine: Difficulty breathing  Swelling of face and throat  A fast heartbeat  A bad rash all over body  Dizziness and weakness

## 2020-11-12 ENCOUNTER — Ambulatory Visit (HOSPITAL_COMMUNITY)
Admission: RE | Admit: 2020-11-12 | Discharge: 2020-11-12 | Disposition: A | Discharge status: Home / Self Care | Payer: Medicaid Other | Source: Ambulatory Visit | Attending: Internal Medicine | Admitting: Internal Medicine

## 2020-11-12 ENCOUNTER — Other Ambulatory Visit: Payer: Self-pay

## 2020-11-12 ENCOUNTER — Other Ambulatory Visit: Payer: Self-pay | Admitting: Obstetrics and Gynecology

## 2020-11-12 DIAGNOSIS — I484 Atypical atrial flutter: Secondary | ICD-10-CM | POA: Insufficient documentation

## 2020-11-12 DIAGNOSIS — I5022 Chronic systolic (congestive) heart failure: Secondary | ICD-10-CM | POA: Diagnosis not present

## 2020-11-12 DIAGNOSIS — I4891 Unspecified atrial fibrillation: Secondary | ICD-10-CM | POA: Insufficient documentation

## 2020-11-12 LAB — BASIC METABOLIC PANEL
Anion gap: 9 (ref 5–15)
BUN: 17 mg/dL (ref 6–20)
CO2: 30 mmol/L (ref 22–32)
Calcium: 9.1 mg/dL (ref 8.9–10.3)
Chloride: 95 mmol/L — ABNORMAL LOW (ref 98–111)
Creatinine, Ser: 1.28 mg/dL — ABNORMAL HIGH (ref 0.61–1.24)
GFR, Estimated: >60 mL/min (ref >60)
Glucose, Bld: 155 mg/dL — ABNORMAL HIGH (ref 70–99)
Potassium: 3.9 mmol/L (ref 3.5–5.1)
Sodium: 134 mmol/L — ABNORMAL LOW (ref 135–145)

## 2020-11-12 NOTE — Patient Instructions (Signed)
Hi Donald [name], thank you for speaking with me today-have a great day!!  Donald [name] was given information about Medicaid Managed Care team care coordination services as a part of their [facility] Medicaid benefit. Donald [name] verbally consented to engagement with the [facility] Managed Care team.   If you are experiencing a medical emergency, please call 911 or report to your local emergency department or urgent care.   If you have a non-emergency medical problem during routine business hours, please contact your provider's office and ask to speak with a nurse.   For questions related to your [facility], please call: [phone] or visit the homepage here: [email or web address]  If you would like to schedule transportation through your [facility], please call the following number at least 2 days in advance of your appointment: [phone].   Call the Behavioral Health Crisis Line at [phone], at any time, 24 hours a day, 7 days a week. If you are in danger or need immediate medical attention call 911.  If you would like help to quit smoking, call 1-800-QUIT-NOW (780-485-7744) OR Espaol: 1-855-Djelo-Ya (8-341-962-2297) o para ms informacin haga clic aqu or Text READY to 989-211 to register via text  Donald [name] - following are the goals we discussed in your visit today:   Goals Addressed             This Visit's Progress    Protect My Health       Timeframe:  Long-Range Goal Priority:  High Start Date:       08/23/20                      Expected End Date:  ongoing                     Follow Up Date: 12/13/20   - schedule appointment for flu shot - schedule appointment for vaccines needed due to my age or health - schedule recommended health tests  - schedule and keep appointment for annual check-up    Why is this important?   Screening tests  can find diseases early when they are easier to treat.  Your doctor or nurse will talk with you about which tests are important for you.  Getting shots for common diseases like the flu and shingles will help prevent them.     Notes: Update 09/23/20:  Patient keeping all appointments. Update 11/12/20:  Patient seen and evaluated by PCP 10/16/20, Dietician 10/14/20.  Needs appointment with Ophthalmologist, GI, sleep study.    The patient verbalized understanding of instructions provided today and declined a print copy of patient instruction materials.   The Managed Medicaid care management team will reach out to the patient again over the next 30 days.  The  Patient has been provided with contact information for the Managed Medicaid care management team and has been advised to call with any health related questions or concerns.   Kathi Der RN, BSN [place]  [employer], production - Managed Medicaid High Risk [phone].    Following is a copy of your plan of care:  Patient Care Plan: General Plan of Care (Adult)     Problem Identified: Health Promotion or Disease Self-Management (General Plan of Care)   Priority: High  Onset Date: 08/23/2020     Long-Range Goal: Self-Management Plan Developed   Start Date: 08/23/2020  Expected End Date: 02/12/2021  This Visit's Progress: On track  Recent Progress: Not on track  Priority: High  Note:   Current Barriers:  Ineffective Self Health Maintenance Currently UNABLE TO independently self manage needs related to chronic health conditions.  Knowledge Deficits related to short term plan for care coordination needs and long term plans for chronic disease management needs related to HTN, COPD, Afib, HF, DM2, OSA.  Needs appointment with Ophthalmologist, GI, sleep study, needs BP cuff. Nurse Case Manager Clinical Goal(s):  patient will work with care management team to address care coordination and chronic disease  management needs related to Disease Management Educational Needs Care Coordination Medication Management and Education Medication Reconciliation Medication Assistance    Interventions:  Evaluation of current treatment plan and patient's adherence to plan as established by provider. Reviewed medications with patient Collaborated with pharmacy regarding medications Discussed plans with patient for ongoing care management follow up and provided patient with direct contact information for care management team Advised patient, providing education and rationale, to monitor blood pressure daily and record, calling provider for findings outside established parameters.  Reviewed scheduled/upcoming provider appointments. Advised patient, providing education and rationale, to check cbg  and record, calling provider  for findings outside established parameters.   Advised patient, providing education and rationale, to weigh daily and record, calling provider for weight gain of 3lbs overnight or 5 pounds in a week.  Care Guide referral for new PCP. Update 09/23/20:  Care guide tried to call patient three times, was unsuccessful in reaching patient.  Patient given phone number to call and follow up. Pharmacy referral for medication review. Update 09/23/20:  Patient has an appointment with Pharmacist 09/26/20.  Pharmacy following. Collaboration with PCP regarding sleep study, GI and Ophthalmology appointments, BP cuff. Self Care Activities:  Patient will self administer medications as prescribed Patient will attend all scheduled provider appointments Patient will call pharmacy for medication refills Patient will continue to perform ADL's independently Patient will continue to perform IADL's independently Patient will call provider office for new concerns or questions Patient Goals: In the next 30 days, patient will attend all appointments. In the next 30 days, patient will speak with Pharmacist. In the  next 30 days, patient will find new PCP. - Follow Up Plan: The patient has been provided with contact information for the care management team and has been advised to call with any health related questions or concerns.  The care management team will reach out to the patient again over the next 30 days.    Evidence-based guidance:   Review biopsychosocial determinants of health screens.  Review need for preventive screening based on age, sex, family history and health history.  Determine level of modifiable health risk.  Discuss identified risks.  Identify areas where behavior change may lead to improved health.  Promote healthy lifestyle.  Evoke change talk using open-ended questions, pros and cons, as well as looking forward.  Identify and manage conditions or preconditions to reduce health risk.  Implement additional goals and interventions based on identified risk factors.

## 2020-11-12 NOTE — Patient Outreach (Signed)
Medicaid Managed Care   Nurse Care Manager Note  11/12/2020 Name:  Donald [name] MRN:  [id number] DOB:  April 17, 1960  Donald [name] is an 60 y.o. year old male who is a primary patient of Donald Pounds, NP.  The [facility] Managed Care Coordination team was consulted for assistance with:    Chronic healthcare management needs.  Donald [name] was given information about Medicaid Managed Care Coordination team services today. Donald [name] Patient agreed to services and verbal consent obtained.  Engaged with patient by telephone for follow up visit in response to provider referral for case management and/or care coordination services.   Assessments/Interventions:  Review of past medical history, allergies, medications, health status, including review of consultants reports, laboratory and other test data, was performed as part of comprehensive evaluation and provision of chronic care management services.  SDOH (Social Determinants of Health) assessments and interventions performed: SDOH Interventions    Flowsheet Row Most Recent Value  SDOH Interventions   Physical Activity Interventions Intervention Not Indicated, [facility] [employer]  Stress Interventions Intervention Not Indicated       Care Plan  No Known Allergies  Medications Reviewed Today     Reviewed by Gayla Medicus, RN (Registered Nurse) on 11/12/20 at 1047  Med List Status: <None>   Medication Order Taking? Sig Documenting Provider Last Dose Status Informant  Accu-Chek Softclix Lancets lancets [id number] No Use as directed Alma Friendly, MD Taking Active Self  albuterol (PROVENTIL HFA;VENTOLIN HFA) 108 (90 Base) MCG/ACT inhaler [id number] No Inhale 2 puffs into the lungs every 4 (four) hours as needed for shortness of breath. [provider] Taking Active Self  albuterol (PROVENTIL) (2.5 MG/3ML) 0.083% nebulizer solution [id number] No Take 3 mLs (2.5 mg total) by nebulization every 4 (four) hours as needed for  wheezing or shortness of breath. Malvin Johns, MD Taking Active Self  amiodarone (PACERONE) 200 MG tablet [id number] No Take 1 tablet (200 mg total) by mouth daily. Darrick Grinder D, NP Taking Active   apixaban (ELIQUIS) 5 MG TABS tablet [id number] No Take 1 tablet (5 mg total) by mouth 2 (two) times daily. Clegg, Amy D, NP Taking Active   atorvastatin (LIPITOR) 20 MG tablet [id number] No Take 1 tablet (20 mg total) by mouth daily. Darrick Grinder D, NP Taking Active   Blood Glucose Monitoring Suppl (BLOOD GLUCOSE MONITOR SYSTEM) w/Device KIT [id number] No USE AS DIRECTED Alma Friendly, MD Taking Active   budesonide-formoterol [facility]) 80-4.5 MCG/ACT inhaler [id number] No Inhale 2 puffs into the lungs in the morning and at bedtime. Alma Friendly, MD Taking Active Self  carvedilol (COREG) 3.125 MG tablet [id number]  Take 1 tablet (3.125 mg total) by mouth 2 (two) times daily. [place], [place], [place]  Active   carvedilol (COREG) 3.125 MG tablet [id number]  Take 1 tablet (3.125 mg total) by mouth 2 (two) times daily. [place], Maricela Bo, FNP  Active   COVID-19 mRNA bivalent vaccine, Pfizer, (PFIZER COVID-19 [facility] BIVALENT) injection [id number]  Inject into the muscle. Carlyle Basques, MD  Active   dapagliflozin propanediol (FARXIGA) 10 MG TABS tablet [id number] No Take 1 tablet (10 mg total) by mouth daily before breakfast. Rafael Bihari, FNP Taking Active   digoxin (LANOXIN) 0.125 MG tablet [id number] No Take 0.5 tablets (0.0625 mg total) by mouth daily. Rafael Bihari, FNP Taking Active   glucose blood test strip [id number] No Use as directed Alma Friendly, MD Taking Active Self  mometasone-formoterol (DULERA) 100-5 MCG/ACT AERO  [id number] No Inhale 1 puff into the lungs daily. [provider] Taking Active Self  potassium chloride SA (KLOR-CON M20) 20 MEQ tablet [id number]  Take 1 tablet (20 mEq total) by mouth daily. [address], [place], FNP  Active   sacubitril-valsartan (ENTRESTO)  49-51 MG [id number]  Take 1 tablet by mouth 2 (two) times daily. [employer], [place], [place]  Active   spironolactone (ALDACTONE) 25 MG tablet [id number] No Take 1 tablet (25 mg total) by mouth daily. Darrick Grinder D, NP Taking Active   torsemide (DEMADEX) 20 MG tablet [id number] No Take 2 tablets (40 mg total) by mouth 2 (two) times daily. Darrick Grinder D, NP Taking Active             Patient Active Problem List   Diagnosis Date Noted   Atypical atrial flutter ([place]) 08/29/2020   Secondary hypercoagulable state ([place]) 08/29/2020   CAP (community acquired pneumonia) 08/06/2020   OSA on CPAP 08/06/2020   Leukocytosis 08/06/2020   Acute pulmonary embolism ([place]) 08/06/2020   Atrial fibrillation with RVR ([place]) 03/08/2020   Pulmonary embolism ([place]) 03/08/2020   Essential hypertension 03/08/2020   COPD (chronic obstructive pulmonary disease) ([place]) 03/08/2020   Anemia 03/08/2020   COPD exacerbation ([place]) 11/16/2014   Atypical chest pain 11/16/2014   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   Paroxysmal atrial fibrillation ([place]) 11/16/2014   Acute bronchitis 11/16/2014   Abnormal EKG 11/16/2014   Type 2 diabetes mellitus without complication ([place]) 00/34/9179   Perirectal abscess 10/20/2012    Conditions to be addressed/monitored per PCP order:   chronic healthcare management needs,  hypertension, COPD, atrial fibrillation, systolic heart failure, pulmonary embolism, CVA, dyslipidemia DM2,  OSA, obesity  Care Plan : General Plan of Care (Adult)  Updates made by Gayla Medicus, RN since 11/12/2020 12:00 AM     Problem: Health Promotion or Disease Self-Management (General Plan of Care)   Priority: High  Onset Date: 08/23/2020     Long-Range Goal: Self-Management Plan Developed   Start Date: 08/23/2020  Expected End Date: 02/12/2021  This Visit's Progress: On track  Recent Progress: Not on track  Priority: High  Note:   Current Barriers:  Ineffective Self Health Maintenance Currently UNABLE  TO independently self manage needs related to chronic health conditions.  Knowledge Deficits related to short term plan for care coordination needs and long term plans for chronic disease management needs related to HTN, COPD, Afib, HF, DM2, OSA.  Needs appointment with Ophthalmologist, GI, sleep study, needs BP cuff. Nurse Case Manager Clinical Goal(s):  patient will work with care management team to address care coordination and chronic disease management needs related to Disease Management Educational Needs Care Coordination Medication Management and Education Medication Reconciliation Medication Assistance    Interventions:  Evaluation of current treatment plan and patient's adherence to plan as established by provider. Reviewed medications with patient Collaborated with pharmacy regarding medications Discussed plans with patient for ongoing care management follow up and provided patient with direct contact information for care management team Advised patient, providing education and rationale, to monitor blood pressure daily and record, calling provider for findings outside established parameters.  Reviewed scheduled/upcoming provider appointments. Advised patient, providing education and rationale, to check cbg  and record, calling provider  for findings outside established parameters.   Advised patient, providing education and rationale, to weigh daily and record, calling provider for weight gain of 3lbs overnight or 5 [name] in a week.  Care Guide referral for new PCP. Update 09/23/20:  Care guide tried to call patient three times, was unsuccessful in reaching patient.  Patient given phone number to call and follow up. Pharmacy referral for medication review. Update 09/23/20:  Patient has an appointment with Pharmacist 09/26/20.  Pharmacy following. Collaboration with PCP regarding sleep study, GI and Ophthalmology appointments, BP cuff. Self Care Activities:  Patient will self administer  medications as prescribed Patient will attend all scheduled provider appointments Patient will call pharmacy for medication refills Patient will continue to perform ADL's independently Patient will continue to perform IADL's independently Patient will call provider office for new concerns or questions Patient Goals: In the next 30 days, patient will attend all appointments. In the next 30 days, patient will speak with Pharmacist. In the next 30 days, patient will find new PCP. - Follow Up Plan: The patient has been provided with contact information for the care management team and has been advised to call with any health related questions or concerns.  The care management team will reach out to the patient again over the next 30 days.    Evidence-based guidance:   Review biopsychosocial determinants of health screens.  Review need for preventive screening based on age, sex, family history and health history.  Determine level of modifiable health risk.  Discuss identified risks.  Identify areas where behavior change may lead to improved health.  Promote healthy lifestyle.  Evoke change talk using open-ended questions, pros and cons, as well as looking forward.  Identify and manage conditions or preconditions to reduce health risk.  Implement additional goals and interventions based on identified risk factors.      Follow Up:  Patient agrees to Care Plan and Follow-up.  Plan: The Managed Medicaid care management team will reach out to the patient again over the next 30 days. and The  Patient has been provided with contact information for the Managed Medicaid care management team and has been advised to call with any health related questions or concerns.  Date/time of next scheduled RN care management/care coordination outreach:  12/13/20 at 0900.

## 2020-11-13 ENCOUNTER — Telehealth (HOSPITAL_COMMUNITY): Payer: Self-pay

## 2020-11-13 NOTE — Telephone Encounter (Signed)
Spoke to Donald [name] who reports doing well and that he has his medications and has no needs this week. I will see him next week for follow up. Call complete.

## 2020-11-15 ENCOUNTER — Telehealth (HOSPITAL_COMMUNITY): Payer: Self-pay

## 2020-11-15 ENCOUNTER — Other Ambulatory Visit (HOSPITAL_COMMUNITY): Payer: Self-pay

## 2020-11-15 DIAGNOSIS — I4891 Unspecified atrial fibrillation: Secondary | ICD-10-CM

## 2020-11-15 DIAGNOSIS — I5022 Chronic systolic (congestive) heart failure: Secondary | ICD-10-CM

## 2020-11-15 DIAGNOSIS — I5043 Acute on chronic combined systolic (congestive) and diastolic (congestive) heart failure: Secondary | ICD-10-CM

## 2020-11-15 MED ORDER — ENTRESTO 49-51 MG PO TABS
1.0000 | ORAL_TABLET | Freq: Two times a day (BID) | ORAL | 3 refills | Status: DC
Start: 2020-11-15 — End: 2021-10-28

## 2020-11-15 NOTE — Telephone Encounter (Signed)
-----   Message from Jacklynn Ganong, [place] sent at 11/13/2020  7:57 AM EDT ----- Labs stable, please repeat BMET in 4 weeks to follow kidney function on increased Entresto dose

## 2020-11-15 NOTE — Telephone Encounter (Signed)
Patient advised and verbalized understanding. Lab appt scheduled, lab order entered  Meds ordered this encounter  Medications   sacubitril-valsartan (ENTRESTO) 49-51 MG    Sig: Take 1 tablet by mouth 2 (two) times daily.    Dispense:  180 tablet    Refill:  3    Please cancel all previous orders for current medication. Change in dosage or pill size.   Orders Placed This Encounter  Procedures   Basic metabolic panel    Standing Status:   Future    Standing Expiration Date:   11/15/2021    Order Specific Question:   Release to patient    Answer:   Immediate

## 2020-11-18 ENCOUNTER — Encounter: Payer: Self-pay | Admitting: Nurse Practitioner

## 2020-11-18 ENCOUNTER — Other Ambulatory Visit: Payer: Self-pay | Admitting: Nurse Practitioner

## 2020-11-18 MED ORDER — BLOOD PRESSURE MONITOR DEVI: 0 refills | Status: DC

## 2020-11-19 ENCOUNTER — Telehealth (HOSPITAL_COMMUNITY): Payer: Self-pay

## 2020-11-19 NOTE — Telephone Encounter (Signed)
Spoke to Mr. Panetta who agreed to home visit Monday 10/31. Call complete.

## 2020-11-21 ENCOUNTER — Ambulatory Visit: Payer: Medicaid Other | Attending: Nurse Practitioner

## 2020-11-21 ENCOUNTER — Other Ambulatory Visit: Payer: Self-pay

## 2020-11-21 DIAGNOSIS — Z23 Encounter for immunization: Secondary | ICD-10-CM | POA: Diagnosis not present

## 2020-11-21 NOTE — Progress Notes (Signed)
Pt received 1st shingles vaccine Pt informed to follow up in 2 months for nd dose.

## 2020-11-26 ENCOUNTER — Telehealth (HOSPITAL_COMMUNITY): Payer: Self-pay

## 2020-11-26 NOTE — Telephone Encounter (Signed)
Attempted to reach Donald [name] for home visit set up. No success. I will continue to reach out.

## 2020-12-04 ENCOUNTER — Telehealth (HOSPITAL_COMMUNITY): Payer: Self-pay | Admitting: Licensed Clinical Social Worker

## 2020-12-04 NOTE — Telephone Encounter (Signed)
HF Paramedicine Team Based Care Meeting  HF MD- NA  HF NP - Amy Clegg NP-C   [facility] HF Paramedicine  Katie Vicente Males  [facility] admit within the last 30 days for heart failure? no  Medications concerns? On bubble packs  Transportation issues ? no  Education needs? no  SDOH - no  Eligible for discharge? Planning for DC next week  Burna Sis, LCSW Clinical Social Worker [facility] Desk#: [phone] Cell#: [phone]

## 2020-12-12 ENCOUNTER — Other Ambulatory Visit (HOSPITAL_COMMUNITY): Payer: Self-pay

## 2020-12-12 ENCOUNTER — Telehealth (HOSPITAL_COMMUNITY): Payer: Self-pay | Admitting: Pharmacist

## 2020-12-12 ENCOUNTER — Other Ambulatory Visit: Payer: Self-pay | Admitting: Obstetrics and Gynecology

## 2020-12-12 MED ORDER — DAPAGLIFLOZIN PROPANEDIOL 10 MG PO TABS: 10.0000 mg | ORAL_TABLET | Freq: Every day | ORAL | 11 refills | Status: DC

## 2020-12-12 NOTE — Patient Instructions (Signed)
Hi Mr. Corbitt, I am sorry I missed you today-I hope you are doing well!! - as a part of your Medicaid benefit, you are eligible for care management and care coordination services at no cost or copay. I was unable to reach you by phone today but would be happy to help you with your health related needs. Please feel free to call me at [phone].  A member of the Managed Medicaid care management team will reach out to you again over the next 7-14 days.   Kathi Der RN, BSN [place]  [employer], production - Managed Medicaid High Risk [phone].

## 2020-12-12 NOTE — Progress Notes (Signed)
Paramedicine Encounter    Patient ID: Donald [name], male    DOB: [birth year], 60 y.o.   MRN: [id number]   Patient Care Team: Gildardo Pounds, NP as PCP - General (Nurse Practitioner) Vickie Epley, MD as PCP - Electrophysiology (Cardiology) Craft, Lorel Monaco, RN as Case Manager Lane Hacker, [facility] as Pharmacist (Pharmacist)  Patient Active Problem List   Diagnosis Date Noted   Atypical atrial flutter ([place]) 08/29/2020   Secondary hypercoagulable state ([place]) 08/29/2020   CAP (community acquired pneumonia) 08/06/2020   OSA on CPAP 08/06/2020   Leukocytosis 08/06/2020   Acute pulmonary embolism ([place]) 08/06/2020   Atrial fibrillation with RVR ([place]) 03/08/2020   Pulmonary embolism ([place]) 03/08/2020   Essential hypertension 03/08/2020   COPD (chronic obstructive pulmonary disease) ([place]) 03/08/2020   Anemia 03/08/2020   COPD exacerbation ([place]) 11/16/2014   Atypical chest pain 11/16/2014   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   Paroxysmal atrial fibrillation ([place]) 11/16/2014   Acute bronchitis 11/16/2014   Abnormal EKG 11/16/2014   Type 2 diabetes mellitus without complication ([place]) 81/44/8185   Perirectal abscess 10/20/2012    Current Outpatient Medications:    Accu-Chek Softclix Lancets lancets, Use as directed, Disp: 100 each, Rfl: 0   albuterol (PROVENTIL HFA;VENTOLIN HFA) 108 (90 Base) MCG/ACT inhaler, Inhale 2 puffs into the lungs every 4 (four) hours as needed for shortness of breath., Disp: , Rfl:    albuterol (PROVENTIL) (2.5 MG/3ML) 0.083% nebulizer solution, Take 3 mLs (2.5 mg total) by nebulization every 4 (four) hours as needed for wheezing or shortness of breath., Disp: 30 vial, Rfl: 0   amiodarone (PACERONE) 200 MG tablet, Take 1 tablet (200 mg total) by mouth daily., Disp: 90 tablet, Rfl: 3   apixaban (ELIQUIS) 5 MG TABS tablet, Take 1 tablet (5 mg total) by mouth 2 (two) times daily., Disp: 60 tablet, Rfl: 3   atorvastatin (LIPITOR) 20 MG tablet, Take 1  tablet (20 mg total) by mouth daily., Disp: 30 tablet, Rfl: 3   Blood Glucose Monitoring Suppl (BLOOD GLUCOSE MONITOR SYSTEM) w/Device KIT, USE AS DIRECTED, Disp: 1 kit, Rfl: 0   Blood Pressure Monitor DEVI, Please provide patient with insurance approved blood pressure monitor ICD 10  I10, Disp: 1 each, Rfl: 0   budesonide-formoterol (SYMBICORT) 80-4.5 MCG/ACT inhaler, Inhale 2 puffs into the lungs in the morning and at bedtime., Disp: 10.2 g, Rfl: 0   carvedilol (COREG) 3.125 MG tablet, Take 1 tablet (3.125 mg total) by mouth 2 (two) times daily., Disp: 60 tablet, Rfl: 0   carvedilol (COREG) 3.125 MG tablet, Take 1 tablet (3.125 mg total) by mouth 2 (two) times daily., Disp: 180 tablet, Rfl: 3   COVID-19 mRNA bivalent vaccine, Pfizer, (PFIZER COVID-19 VAC BIVALENT) injection, Inject into the muscle., Disp: 0.3 mL, Rfl: 0   dapagliflozin propanediol (FARXIGA) 10 MG TABS tablet, Take 1 tablet (10 mg total) by mouth daily before breakfast., Disp: 30 tablet, Rfl: 4   digoxin (LANOXIN) 0.125 MG tablet, Take 0.5 tablets (0.0625 mg total) by mouth daily., Disp: 30 tablet, Rfl: 3   glucose blood test strip, Use as directed, Disp: 100 each, Rfl: 0   mometasone-formoterol (DULERA) 100-5 MCG/ACT AERO, Inhale 1 puff into the lungs daily., Disp: , Rfl:    potassium chloride SA (KLOR-CON M20) 20 MEQ tablet, Take 1 tablet (20 mEq total) by mouth daily., Disp: 90 tablet, Rfl: 3   sacubitril-valsartan (ENTRESTO) 49-51 MG, Take 1 tablet by mouth 2 (two) times daily.,  Disp: 180 tablet, Rfl: 3   spironolactone (ALDACTONE) 25 MG tablet, Take 1 tablet (25 mg total) by mouth daily., Disp: 30 tablet, Rfl: 3   torsemide (DEMADEX) 20 MG tablet, Take 2 tablets (40 mg total) by mouth 2 (two) times daily., Disp: 120 tablet, Rfl: 6 No Known Allergies   Social History   Socioeconomic History   Marital status: Single    Spouse name: Not on file   Number of children: Not on file   Years of education: Not on file   Highest  education level: Not on file  Occupational History   Not on file  Tobacco Use   Smoking status: Former    Packs/day: 0.50    Types: Cigarettes    Quit date: 2018    Years since quitting: 4.8   Smokeless tobacco: Never  Substance and Sexual Activity   Alcohol use: Not Currently   Drug use: Not Currently    Types: Cocaine   Sexual activity: Not Currently  Other Topics Concern   Not on file  Social History Narrative   Not on file   Social Determinants of Health   Financial Resource Strain: Medium Risk   Difficulty of Paying Living Expenses: Somewhat hard  Food Insecurity: Food Insecurity Present   Worried About Charity fundraiser in the Last Year: Never true   Ran Out of Food in the Last Year: Sometimes true  Transportation Needs: Unmet Transportation Needs   Lack of Transportation (Medical): Yes   Lack of Transportation (Non-Medical): Yes  Physical Activity: Insufficiently Active   Days of Exercise per Week: 3 days   Minutes of Exercise per Session: 20 min  Stress: No Stress Concern Present   Feeling of Stress : Only a little  Social Connections: Moderately Isolated   Frequency of Communication with Friends and Family: More than three times a week   Frequency of Social Gatherings with Friends and Family: More than three times a week   Attends Religious Services: 1 to 4 times per year   Active Member of [employer] or Organizations: No   Attends Archivist Meetings: Never   Marital Status: Never married  Human resources officer Violence: Not At Risk   Fear of Current or Ex-Partner: No   Emotionally Abused: No   Physically Abused: No   Sexually Abused: No    Physical Exam Vitals reviewed.  Constitutional:      Appearance: Normal appearance. He is normal weight.  HENT:     Head: Normocephalic.     Nose: Nose normal.     Mouth/Throat:     Mouth: Mucous membranes are moist.     Pharynx: Oropharynx is clear.  Eyes:     Conjunctiva/sclera: Conjunctivae normal.      Pupils: Pupils are equal, round, and reactive to light.  Cardiovascular:     Rate and Rhythm: Normal rate and regular rhythm.     Pulses: Normal pulses.     Heart sounds: Normal heart sounds.  Pulmonary:     Effort: Pulmonary effort is normal. No respiratory distress.     Breath sounds: Normal breath sounds. No wheezing, rhonchi or rales.  Abdominal:     Palpations: Abdomen is soft.  Musculoskeletal:        General: No swelling or tenderness. Normal range of motion.     Cervical back: Normal range of motion.     Right lower leg: No edema.     Left lower leg: No edema.  Skin:  General: Skin is warm and dry.     Capillary Refill: Capillary refill takes less than 2 seconds.  Neurological:     General: No focal deficit present.     Mental Status: He is alert. Mental status is at baseline.  Psychiatric:        Mood and Affect: Mood normal.    Arrived for home visit for Da who was staying at his aunts home in [place]. He has bubble packs and reports he has been taking his medications daily. Dontay reports his blood sugars have been around 150-250 range. He has been drinking a lot of juice he reports. We discussed this and he understands to limit his daily sugar/carb intake. I obtained vitals and assessment. No swelling noted, 5lb weight gain since last office visit on 10/30/20.   I reviwed medication. Wilder Glade was missing from bubble packs, Petar states he is unsure if he ever started that medication in his bubble packs. I called and spoke to pharmacist who reports he does not have RX for [place] and never has. I called HF clinic pharmacist who reports the RX was never faxed to [facility]. Patient has not ever started [place] but RX was sent today to [facility] and will be placed in updated bubble packs starting next week.   We reviewed appointments in which he has a lab appointment in HF clinic on Monday. Ride pick up is 1:00-1:15 for a 1:45 appointment.   Home visit complete. I plan to see Danyl  in two weeks to follow up. I will ensure bubble packs get corrected and patient receives them. I will make clinic staff aware of start of medication Wilder Glade being delayed.   We discussed social barriers and he reports having trouble with paying for his gas bill. I will see If he qualifies for LIEAP and being this process for him.       Future Appointments  Date Time Provider [place]  12/12/2020 12:30 PM [facility] CCC-MM CARE MANAGER 2 [facility] None  12/16/2020  1:45 PM [facility] LAB [facility] None  12/16/2020  2:45 PM Fredia Sorrow, RD [place] NDM  12/26/2020  9:30 AM [facility] CCC-MM PHARMACIST [facility] None  01/15/2021  3:10 PM Gildardo Pounds, NP [facility] None  04/01/2021  9:20 AM Vickie Epley, MD [facility]     ACTION: Home visit completed

## 2020-12-12 NOTE — Telephone Encounter (Signed)
Received message from [facility] (paramedicine) that patient does not have Farxiga in his bubble pack and wants to know if he needs to be taking it. Per chart notes, he should be taking the [place]. When prescription was sent, it was made "no print" and never sent to his pharmacy. Marcelline Deist sent to [facility] per patient request.    Karle Plumber, PharmD, BCPS, BCCP, CPP Heart Failure Clinic Pharmacist [phone]

## 2020-12-12 NOTE — Patient Outreach (Signed)
Care Coordination  12/12/2020  BRYCIN KILLE Jun 27, 1960 [id number]   Medicaid Managed Care   Unsuccessful Outreach Note  12/12/2020 Name: BREVYN [name] MRN: [id number] DOB: October 05, 1960  Referred by: Claiborne Rigg, NP Reason for referral : High Risk Managed Medicaid (Unsuccessful telephone outreach)   An unsuccessful telephone outreach was attempted today. The patient was referred to the case management team for assistance with care management and care coordination.   Follow Up Plan: The care management team will reach out to the patient again over the next 7-14 days.   Kathi Der RN, BSN [place]  [employer], production - Managed Medicaid High Risk [phone].

## 2020-12-13 ENCOUNTER — Ambulatory Visit: Payer: Medicaid Other

## 2020-12-16 ENCOUNTER — Encounter: Payer: Medicaid Other | Attending: Critical Care Medicine | Admitting: Dietician

## 2020-12-16 ENCOUNTER — Encounter: Payer: Self-pay | Admitting: Dietician

## 2020-12-16 ENCOUNTER — Other Ambulatory Visit: Payer: Self-pay

## 2020-12-16 ENCOUNTER — Ambulatory Visit (HOSPITAL_COMMUNITY)
Admission: RE | Admit: 2020-12-16 | Discharge: 2020-12-16 | Disposition: A | Discharge status: Home / Self Care | Payer: Medicaid Other | Source: Ambulatory Visit | Attending: Family Medicine | Admitting: Family Medicine

## 2020-12-16 DIAGNOSIS — I5022 Chronic systolic (congestive) heart failure: Secondary | ICD-10-CM

## 2020-12-16 DIAGNOSIS — E119 Type 2 diabetes mellitus without complications: Secondary | ICD-10-CM

## 2020-12-16 LAB — BASIC METABOLIC PANEL
Anion gap: 9 (ref 5–15)
BUN: 13 mg/dL (ref 6–20)
CO2: 33 mmol/L — ABNORMAL HIGH (ref 22–32)
Calcium: 9.4 mg/dL (ref 8.9–10.3)
Chloride: 96 mmol/L — ABNORMAL LOW (ref 98–111)
Creatinine, Ser: 1.22 mg/dL (ref 0.61–1.24)
GFR, Estimated: >60 mL/min (ref >60)
Glucose, Bld: 136 mg/dL — ABNORMAL HIGH (ref 70–99)
Potassium: 4.3 mmol/L (ref 3.5–5.1)
Sodium: 138 mmol/L (ref 135–145)

## 2020-12-16 NOTE — Patient Instructions (Addendum)
[facility], Wednesday, and Friday. Start by exercising 15-20 minutes, and increase the duration as tolerated.  Talk to your doctor about getting a prescription for the POGO blood sugar monitoring device.   Check your blood sugar each morning before eating or drinking (fasting). Look for numbers between 70-100 mg/dL

## 2020-12-16 NOTE — Progress Notes (Signed)
Diabetes Self-Management Education  Visit Type: Follow-up  Appt. Start Time: 1405 Appt. End Time: 1440  12/16/2020  [name], identified by name and date of birth, is a 60 y.o. male with a diagnosis of Diabetes:  .   ASSESSMENT Pt will be starting their [place] today. Pt has not been checking their blood sugar recently, might check once a week. Most recent FBG was 109. Pt has plenty of test strips and lancets. Pt is interested in an easier way to check their blood sugar. Pt reports needing assistance with food occasionally, but is not worried that they will run out. Pt has an in home aid that visits them once a month, pt states their aid is helping them get a [facility] card for fresh produce. Pt is now eating 3 meals a day, drinking plenty of water. Pt reports no further "loopy" feelings since our last visit.  Height 6' (1.829 m), weight 296 lb 1.6 oz (134.3 kg). Body mass index is 40.16 kg/m.   Diabetes Self-Management Education - 12/16/20 1425       Visit Information   Visit Type Follow-up      Complications   Fasting Blood glucose range (mg/dL) 74-944      Dietary Intake   Breakfast eggs, toast, milk    Lunch Steak, broccoli and cheese, potatoes    Dinner Salad w/ thousand island dressing      Exercise   Exercise Type ADL's      Individualized Goals (developed by patient)   Nutrition Follow meal plan discussed;General guidelines for healthy choices and portions discussed    Physical Activity Exercise 3-5 times per week    Medications take my medication as prescribed    Monitoring  test my blood glucose as discussed      Patient Self-Evaluation of Goals - Patient rates self as meeting previously set goals (% of time)   Nutrition 50 - 75 %    Physical Activity < 25%    Medications < 25%    Monitoring 25 - 50%    Problem Solving 25 - 50%    Reducing Risk < 25%    Health Coping 25 - 50%      Post-Education Assessment   Patient understands the diabetes disease  and treatment process. Needs Review    Patient understands incorporating nutritional management into lifestyle. Needs Review    Patient undertands incorporating physical activity into lifestyle. Needs Review    Patient understands using medications safely. Needs Review    Patient understands monitoring blood glucose, interpreting and using results Needs Review    Patient understands prevention, detection, and treatment of acute complications. Needs Review    Patient understands prevention, detection, and treatment of chronic complications. Needs Review    Patient understands how to develop strategies to address psychosocial issues. Needs Review    Patient understands how to develop strategies to promote health/change behavior. Needs Review      Outcomes   Expected Outcomes Demonstrated limited interest in learning.  Expect minimal changes    Future DMSE 3-4 months    Program Status Not Completed      Subsequent Visit   Since your last visit have you continued or begun to take your medications as prescribed? No    Since your last visit have you had your blood pressure checked? Yes    Is your most recent blood pressure lower, unchanged, or higher since your last visit? Lower    Since your last visit have you  experienced any weight changes? Gain    Weight Gain (lbs) 15    Since your last visit, are you checking your blood glucose at least once a day? No             Individualized Plan for Diabetes Self-Management Training:   Learning Objective:  Patient will have a greater understanding of diabetes self-management. Patient education plan is to attend individual and/or group sessions per assessed needs and concerns.   Plan:   Patient Instructions  [facility] to [facility] on Monday, Wednesday, and Friday. Start by exercising 15-20 minutes, and increase the duration as tolerated.  Talk to your doctor about getting a prescription for the POGO blood sugar monitoring  device.   Check your blood sugar each morning before eating or drinking (fasting). Look for numbers between 70-100 mg/dL         Expected Outcomes:  Demonstrated limited interest in learning.  Expect minimal changes  If problems or questions, patient to contact team via:  Phone and Email  Future DSME appointment: 3-4 months

## 2020-12-26 ENCOUNTER — Ambulatory Visit: Payer: Self-pay

## 2020-12-27 ENCOUNTER — Other Ambulatory Visit: Payer: Self-pay | Admitting: Obstetrics and Gynecology

## 2020-12-27 NOTE — Patient Outreach (Signed)
Care Coordination  12/27/2020  Donald [name] 1960/04/07 [id number]   Medicaid Managed Care   Unsuccessful Outreach Note  12/27/2020 Name: Donald [name] MRN: [id number] DOB: 1960/07/06  Referred by: Claiborne Rigg, NP Reason for referral : High Risk Managed Medicaid (Unsuccessful telephone outreach)   A second unsuccessful telephone outreach was attempted today. The patient was referred to the case management team for assistance with care management and care coordination.   Follow Up Plan: The care management team will reach out to the patient again over the next 7 days.   Kathi Der RN, BSN [place]  [employer]ad Engineer, production - Managed Medicaid High Risk [phone]

## 2020-12-27 NOTE — Patient Instructions (Signed)
Hey Mr. Goyer, I am so sorry I missed you this afternoon-I hope you are doing well! - as a part of your Medicaid benefit, you are eligible for care management and care coordination services at no cost or copay. I was unable to reach you by phone today but would be happy to help you with your health related needs. Please feel free to call me at [phone].  A member of the Managed Medicaid care management team will reach out to you again over the next 7 days.   Kathi Der RN, BSN [place]  Triad Engineer, production - Managed Medicaid High Risk [phone].

## 2020-12-30 ENCOUNTER — Telehealth: Payer: Self-pay | Admitting: Nurse Practitioner

## 2020-12-30 ENCOUNTER — Telehealth (HOSPITAL_COMMUNITY): Payer: Self-pay

## 2020-12-30 NOTE — Telephone Encounter (Signed)
..   Medicaid Managed Care   Unsuccessful Outreach Note  12/30/2020 Name: Donald [name] MRN: [id number] DOB: 08-26-1960  Referred by: Claiborne Rigg, NP Reason for referral : High Risk Managed Medicaid (I attempted to reach patient today to get him rescheduled for phone visit with the [facility] and the Pharmacist. His number was not in order when I called.)   An unsuccessful telephone outreach was attempted today. The patient was referred to the case management team for assistance with care management and care coordination.   Follow Up Plan: The care management team will reach out to the patient again over the next 7 days.   Weston Settle Care Guide, High Risk Medicaid Managed Care Embedded Care Coordination [facility]  [facility]

## 2020-12-30 NOTE — Telephone Encounter (Signed)
Spoke to Donald [name] on his aunts phone, he reports his phone is cut off and does not have the funds to get it turned back on at present. I reached out to [employer] in reference to same. Donald [name] reports he is doing okay but needs a automated glucometer (dexcom, freestyle) which was recommended by his diabetic nutritionist. I will let his PCP know about same. He reports he has been taking [place] which was added into his bubble packs recently with no problems. I plan to see Donald [name] at his aunts home tomorrow at 1500. Call complete.

## 2020-12-30 NOTE — Telephone Encounter (Signed)
Patients must be on insulin and monitoring blood glucose levels at least 4 times a day to qualify for libre or dexcom.

## 2020-12-31 ENCOUNTER — Telehealth (HOSPITAL_COMMUNITY): Payer: Self-pay

## 2020-12-31 NOTE — Telephone Encounter (Signed)
Spoke to Donald [name] to reschedule our visit today for Monday 12/12. He agreed with plan. Call complete.

## 2021-01-02 ENCOUNTER — Telehealth (HOSPITAL_COMMUNITY): Payer: Self-pay | Admitting: Licensed Clinical Social Worker

## 2021-01-02 NOTE — Telephone Encounter (Signed)
CSW consulted by [employer] to help with pt concerns with his phone service being cut off.  Pt has plan through [employer] paid for by the government- states he is on plan that gives him 1000 minutes a month but he often runs out then can't call people which is what has happened this time.  Pt states he is trying to get on unlimited plan option and thinks he applied online but still can't use his phone.  CSW spoke with representative and was directed to apply online- when CSW tried to apply online it said that pt account already had pending application and they would text him within 7 days to inform of decision- CSW passed this information along to pt.  Will continue to follow and assist as needed  Burna Sis, LCSW Clinical Social Worker [facility] Desk#: [phone] Cell#: [phone]

## 2021-01-06 ENCOUNTER — Other Ambulatory Visit (HOSPITAL_COMMUNITY): Payer: Self-pay

## 2021-01-06 ENCOUNTER — Telehealth: Payer: Self-pay

## 2021-01-06 NOTE — Progress Notes (Signed)
Paramedicine Encounter    Patient ID: Donald [name], male    DOB: [birth year], 60 y.o.   MRN: [id number]  Arrived for home visit for Donald [name] who reports he is feeling good. He is ambulating around outside with no shortness of breath, chest pain or dizziness. Donald [name] states he has been doing well and having no side affects with the [place]. He reports his weight being down 5lbs since last visit. No swelling edema noted. Lungs clear. Vitals noted.   BP- 110/68 HR- 64 regular   Bubble packs reviewed and confirmed. He will need new 4 week packs in two days. I texted same to [employer].   We reviewed appointments and confirmed transportation to use medicaid transport. He agreed and was provided with number.   Home visit complete. I will plan to d/c after meds are successfully delivered.       Patient Care Team: Gildardo Pounds, NP as PCP - General (Nurse Practitioner) Vickie Epley, MD as PCP - Electrophysiology (Cardiology) Craft, Lorel Monaco, RN as Case Manager Lane [facility]acker, [facility] as Pharmacist (Pharmacist)  Patient Active Problem List   Diagnosis Date Noted   Atypical atrial flutter ([place]) 08/29/2020   Secondary hypercoagulable state ([place]) 08/29/2020   CAP (community acquired pneumonia) 08/06/2020   OSA on CPAP 08/06/2020   Leukocytosis 08/06/2020   Acute pulmonary embolism ([place]) 08/06/2020   Atrial fibrillation with RVR ([place]) 03/08/2020   Pulmonary embolism ([place]) 03/08/2020   Essential hypertension 03/08/2020   COPD (chronic obstructive pulmonary disease) ([place]) 03/08/2020   Anemia 03/08/2020   COPD exacerbation ([place]) 11/16/2014   Atypical chest pain 11/16/2014   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   Paroxysmal atrial fibrillation ([place]) 11/16/2014   Acute bronchitis 11/16/2014   Abnormal EKG 11/16/2014   Type 2 diabetes mellitus without complication ([place]) 82/42/3536   Perirectal abscess 10/20/2012    Current Outpatient Medications:    Accu-Chek Softclix  Lancets lancets, Use as directed, Disp: 100 each, Rfl: 0   albuterol (PROVENTIL HFA;VENTOLIN HFA) 108 (90 Base) MCG/ACT inhaler, Inhale 2 puffs into the lungs every 4 (four) hours as needed for shortness of breath., Disp: , Rfl:    albuterol (PROVENTIL) (2.5 MG/3ML) 0.083% nebulizer solution, Take 3 mLs (2.5 mg total) by nebulization every 4 (four) hours as needed for wheezing or shortness of breath., Disp: 30 vial, Rfl: 0   amiodarone (PACERONE) 200 MG tablet, Take 1 tablet (200 mg total) by mouth daily., Disp: 90 tablet, Rfl: 3   apixaban (ELIQUIS) 5 MG TABS tablet, Take 1 tablet (5 mg total) by mouth 2 (two) times daily., Disp: 60 tablet, Rfl: 3   atorvastatin (LIPITOR) 20 MG tablet, Take 1 tablet (20 mg total) by mouth daily., Disp: 30 tablet, Rfl: 3   Blood Glucose Monitoring Suppl (BLOOD GLUCOSE MONITOR SYSTEM) w/Device KIT, USE AS DIRECTED, Disp: 1 kit, Rfl: 0   Blood Pressure Monitor DEVI, Please provide patient with insurance approved blood pressure monitor ICD 10  I10, Disp: 1 each, Rfl: 0   budesonide-formoterol (SYMBICORT) 80-4.5 MCG/ACT inhaler, Inhale 2 puffs into the lungs in the morning and at bedtime., Disp: 10.2 g, Rfl: 0   carvedilol (COREG) 3.125 MG tablet, Take 1 tablet (3.125 mg total) by mouth 2 (two) times daily., Disp: 60 tablet, Rfl: 0   carvedilol (COREG) 3.125 MG tablet, Take 1 tablet (3.125 mg total) by mouth 2 (two) times daily., Disp: 180 tablet, Rfl: 3   COVID-19 mRNA bivalent vaccine, [place], (PFIZER COVID-19 VAC  BIVALENT) injection, Inject into the muscle., Disp: 0.3 mL, Rfl: 0   dapagliflozin propanediol (FARXIGA) 10 MG TABS tablet, Take 1 tablet (10 mg total) by mouth daily before breakfast., Disp: 30 tablet, Rfl: 11   digoxin (LANOXIN) 0.125 MG tablet, Take 0.5 tablets (0.0625 mg total) by mouth daily., Disp: 30 tablet, Rfl: 3   glucose blood test strip, Use as directed, Disp: 100 each, Rfl: 0   mometasone-formoterol (DULERA) 100-5 MCG/ACT AERO, Inhale 1 puff into  the lungs daily., Disp: , Rfl:    potassium chloride SA (KLOR-CON M20) 20 MEQ tablet, Take 1 tablet (20 mEq total) by mouth daily., Disp: 90 tablet, Rfl: 3   sacubitril-valsartan (ENTRESTO) 49-51 MG, Take 1 tablet by mouth 2 (two) times daily., Disp: 180 tablet, Rfl: 3   spironolactone (ALDACTONE) 25 MG tablet, Take 1 tablet (25 mg total) by mouth daily., Disp: 30 tablet, Rfl: 3   torsemide (DEMADEX) 20 MG tablet, Take 2 tablets (40 mg total) by mouth 2 (two) times daily., Disp: 120 tablet, Rfl: 6 No Known Allergies   Social History   Socioeconomic History   Marital status: Single    Spouse name: Not on file   Number of children: Not on file   Years of education: Not on file   Highest education level: Not on file  Occupational History   Not on file  Tobacco Use   Smoking status: Former    Packs/day: 0.50    Types: Cigarettes    Quit date: 2018    Years since quitting: 4.9   Smokeless tobacco: Never  Substance and Sexual Activity   Alcohol use: Not Currently   Drug use: Not Currently    Types: Cocaine   Sexual activity: Not Currently  Other Topics Concern   Not on file  Social History Narrative   Not on file   Social Determinants of Health   Financial Resource Strain: Medium Risk   Difficulty of Paying Living Expenses: Somewhat hard  Food Insecurity: Food Insecurity Present   Worried About Charity fundraiser in the Last Year: Never true   Ran Out of Food in the Last Year: Sometimes true  Transportation Needs: Unmet Transportation Needs   Lack of Transportation (Medical): Yes   Lack of Transportation (Non-Medical): Yes  Physical Activity: Insufficiently Active   Days of Exercise per Week: 3 days   Minutes of Exercise per Session: 20 min  Stress: No Stress Concern Present   Feeling of Stress : Only a little  Social Connections: Moderately Isolated   Frequency of Communication with Friends and Family: More than three times a week   Frequency of Social Gatherings with  Friends and Family: More than three times a week   Attends Religious Services: 1 to 4 times per year   Active Member of [employer] or Organizations: No   Attends Archivist Meetings: Never   Marital Status: Never married  Human resources officer Violence: Not At Risk   Fear of Current or Ex-Partner: No   Emotionally Abused: No   Physically Abused: No   Sexually Abused: No    Physical Exam Vitals reviewed.  Constitutional:      Appearance: Normal appearance. He is normal weight.  HENT:     Head: Normocephalic.     Nose: Nose normal.     Mouth/Throat:     Mouth: Mucous membranes are moist.     Pharynx: Oropharynx is clear.  Eyes:     Conjunctiva/sclera: Conjunctivae normal.  Pupils: Pupils are equal, round, and reactive to light.  Cardiovascular:     Rate and Rhythm: Normal rate and regular rhythm.     Pulses: Normal pulses.     Heart sounds: Normal heart sounds.  Pulmonary:     Effort: Pulmonary effort is normal.     Breath sounds: Normal breath sounds.  Abdominal:     General: Abdomen is flat.     Palpations: Abdomen is soft.  Musculoskeletal:        General: No swelling. Normal range of motion.     Cervical back: Normal range of motion.     Right lower leg: No edema.     Left lower leg: No edema.  Skin:    General: Skin is warm and dry.     Capillary Refill: Capillary refill takes less than 2 seconds.  Neurological:     General: No focal deficit present.     Mental Status: He is alert. Mental status is at baseline.  Psychiatric:        Mood and Affect: Mood normal.        Behavior: Behavior normal.        Thought Content: Thought content normal.        Judgment: Judgment normal.        Future Appointments  Date Time Provider [place]  01/07/2021  1:30 PM [facility] CCC-MM CARE MANAGER 2 THN-CCC None  01/15/2021  3:10 PM Gildardo Pounds, NP CHW-CHWW None  03/17/2021  4:45 PM Fredia Sorrow, RD NDM-NMCH NDM  04/01/2021  9:20 AM Vickie Epley, MD  [facility]     ACTION: Home visit completed

## 2021-01-06 NOTE — Telephone Encounter (Signed)
Call made to conduct screening for transportation needs. Was able to reach patient and provide phone number for him to coordinate non-emergency transportation services through insurance benefits.

## 2021-01-07 ENCOUNTER — Other Ambulatory Visit: Payer: Self-pay | Admitting: Obstetrics and Gynecology

## 2021-01-07 ENCOUNTER — Other Ambulatory Visit (HOSPITAL_COMMUNITY): Payer: Self-pay | Admitting: Adult Health

## 2021-01-07 NOTE — Patient Outreach (Signed)
Care Coordination  01/07/2021  DUTCH ING 02-23-1960 [id number]   Medicaid Managed Care   Unsuccessful Outreach Note  01/07/2021 Name: Donald [name] MRN: [id number] DOB: [birth year]  Referred by: Claiborne Rigg, NP Reason for referral : High Risk Managed Medicaid (Unsuccessful telephone outreach)   Third unsuccessful telephone outreach was attempted today. The patient was referred to the case management team for assistance with care management and care coordination. The patient's primary care provider has been notified of our unsuccessful attempts to make or maintain contact with the patient. The care management team is pleased to engage with this patient at any time in the future should he/she be interested in assistance from the care management team.   Follow Up Plan: We have been unable to make contact with the patient for follow up. The care management team is available to follow up with the patient after provider conversation with the patient regarding recommendation for care management engagement and subsequent re-referral to the care management team.   Kathi Der RN, BSN [place]   [facility] Care Management Coordinator - Managed [facility] High Risk [phone].

## 2021-01-07 NOTE — Patient Instructions (Signed)
Hey Mr. Umar am sorry I missed you today-I hope you are doing well - as a part of your Medicaid benefit, you are eligible for care management and care coordination services at no cost or copay. I was unable to reach you by phone today but would be happy to help you with your health related needs. Please feel free to call me at [phone]  Kathi Der RN, BSN [place]   [facility] Care Management Coordinator - Managed [place] High Risk [phone].

## 2021-01-08 ENCOUNTER — Telehealth: Payer: Self-pay | Admitting: Nurse Practitioner

## 2021-01-08 NOTE — Telephone Encounter (Signed)
Donald [name] will be on vacation 12/21. Left vm for patient to call [phone] to rescheduled

## 2021-01-15 ENCOUNTER — Ambulatory Visit: Payer: Medicaid Other | Admitting: Nurse Practitioner

## 2021-01-29 ENCOUNTER — Telehealth (HOSPITAL_COMMUNITY): Payer: Self-pay

## 2021-01-29 NOTE — Telephone Encounter (Signed)
Spoke with Mr. Heupel in regards to graduating from paramedicine program. Mr. Zurn has transportation, medications, is aware of his providers and who to contact if needed. Mr. Streight is appreciative of paramedicine services. Discharge complete.    Patient is now discharged from [facility].  Patient has/has not met the following goals:  Yes :Patient expresses basic understanding of medications and what they are for Yes :Patient able to verbalize heart failure specific dietary/fluid restrictions Yes :Patient is aware of who to call if they have medical concerns or if they need to schedule or change appts Yes :Patient has a scale for daily weights and weighs regularly Yes :Patient able to verbalize concerning symptoms when they should call the HF clinic (weight gain ranges, etc) Yes :Patient has a PCP and has seen within the past year or has upcoming appt Yes :Patient has reliable access to getting their medications Yes :Patient has shown they are able to reorder medications reliably No :Patient has had admission in past 30 days- if yes how many? No :Patient has had admission in past 90 days- if yes how many?  Discharge Comments:

## 2021-03-17 ENCOUNTER — Ambulatory Visit: Payer: Medicaid Other | Attending: Nurse Practitioner | Admitting: Nurse Practitioner

## 2021-03-17 ENCOUNTER — Other Ambulatory Visit: Payer: Self-pay

## 2021-03-17 ENCOUNTER — Encounter: Payer: Medicaid Other | Attending: Nurse Practitioner | Admitting: Dietician

## 2021-03-17 ENCOUNTER — Encounter: Payer: Self-pay | Admitting: Nurse Practitioner

## 2021-03-17 ENCOUNTER — Encounter: Payer: Self-pay | Admitting: Dietician

## 2021-03-17 VITALS — BP 116/83 | HR 73 | Ht 72.0 in | Wt 360.2 lb

## 2021-03-17 DIAGNOSIS — Z23 Encounter for immunization: Secondary | ICD-10-CM

## 2021-03-17 DIAGNOSIS — E119 Type 2 diabetes mellitus without complications: Secondary | ICD-10-CM | POA: Diagnosis present

## 2021-03-17 DIAGNOSIS — D649 Anemia, unspecified: Secondary | ICD-10-CM | POA: Diagnosis not present

## 2021-03-17 DIAGNOSIS — Z9989 Dependence on other enabling machines and devices: Secondary | ICD-10-CM | POA: Diagnosis not present

## 2021-03-17 DIAGNOSIS — Z1211 Encounter for screening for malignant neoplasm of colon: Secondary | ICD-10-CM

## 2021-03-17 DIAGNOSIS — E1165 Type 2 diabetes mellitus with hyperglycemia: Secondary | ICD-10-CM | POA: Diagnosis not present

## 2021-03-17 DIAGNOSIS — G4733 Obstructive sleep apnea (adult) (pediatric): Secondary | ICD-10-CM | POA: Diagnosis not present

## 2021-03-17 LAB — POCT GLYCOSYLATED HEMOGLOBIN (HGB A1C): Hemoglobin A1C: 7.3 % — AB (ref 4.0–5.6)

## 2021-03-17 LAB — GLUCOSE, POCT (MANUAL RESULT ENTRY): POC Glucose: 149 mg/dL — AB (ref 70–99)

## 2021-03-17 MED ORDER — TRAZODONE HCL 100 MG PO TABS: 50.0000 mg | ORAL_TABLET | Freq: Every day | ORAL | 0 refills | Status: DC

## 2021-03-17 NOTE — Patient Instructions (Addendum)
Keep your blood sugar meter together with your medication, so that when you go to your aunt's house you can check your blood sugar.  As the weather gets warmer, begin to try to walk to [facility].  Cut back on your fried and fatty foods.  Look for weight loss of no more than 1-2 pounds a week.  Check your blood sugar each morning before eating or drinking (fasting). Look for numbers between 70-100 mg/dL Your goal A1c is below 7.0%

## 2021-03-17 NOTE — Progress Notes (Signed)
Assessment & Plan:  [name] was seen today for diabetes.  Diagnoses and all orders for this visit:  Type 2 diabetes mellitus with hyperglycemia, without long-term current use of insulin (HCC) -     POCT glycosylated hemoglobin (Hb A1C) -     POCT glucose (manual entry) -     CMP14+EGFR -     Microalbumin / creatinine urine ratio  Need for shingles vaccine -     Varicella-zoster vaccine IM (Shingrix)  Colon cancer screening -     Ambulatory referral to Gastroenterology  Anemia, unspecified type -     CBC -     Iron, TIBC and Ferritin Panel  OSA on CPAP -     PSG Sleep Study; Future  Other orders -     traZODone (DESYREL) 100 MG tablet; Take 0.5-1 tablets (50-100 mg total) by mouth at bedtime.    Patient has been counseled on age-appropriate routine health concerns for screening and prevention. These are reviewed and up-to-date. Referrals have been placed accordingly. Immunizations are up-to-date or declined.    Subjective:   Chief Complaint  Patient presents with   Diabetes   HPI Donald [name] 61 y.o. male presents to office today for follow up to DM and HTN.   PMH: Afib, aflutter, chronic combined systolic and diastolic heart failure, submassive B/L pulmonary embolism, COPD, hypertension, stroke, hyperlipidemia, OSA on CPAP   He is currently being followed by  Cardiology for afib/flutter. S/p cardioversion 09-02-2020. Currently remains in NSR. Continues on eliquis 5 mg BID.   Gastroenterology He had rectal surgery 7-9 years ago. States he had an abscess in the rectal area that required stiches and the stitches are still present. He would like to have them removed.    OSA on CPAP He only gets 2-3 hours of sleep. Other symptoms of OSA include daytime fatigue, morning fatigue, morning headache, and hypertension. Patient generally gets a few hours of sleep per night, along with difficulty falling asleep, nightime awakenings, and difficulty falling back asleep if  awakened. Snoring of severe severity is present. Apneic episodes are present.   His current CPAP does not work. CPAP has not been working for 4 years however when he was using his CPAP it provided restful sleep, decreased apneic episodes, snoring and sleeping 6-7 hours at a time.    HTN Blood pressure is well controlled. He is currently taking amiodarone 200 mg daily, carvedilol 3.125 mg BID, entresto 49-51 mg BID, spironolactone 25 mg daily and torsemide 40 mg BID.  BP Readings from Last 3 Encounters:  03/17/21 116/83  01/06/21 110/68  12/12/20 108/60    DM 2 Improved and down from 8.3 to 7.3. Currently taking farxiga 10 mg daily. Denies any symptoms of hypo or hyperglycemia. LDL at goal with atorvastatin 20 mg daily.  Lab Results  Component Value Date   HGBA1C 7.3 (A) 03/17/2021    Lab Results  Component Value Date   LDLCALC 56 03/08/2020     Review of Systems  Constitutional:  Negative for fever, malaise/fatigue and weight loss.  HENT: Negative.  Negative for nosebleeds.   Eyes: Negative.  Negative for blurred vision, double vision and photophobia.  Respiratory: Negative.  Negative for cough and shortness of breath.   Cardiovascular: Negative.  Negative for chest pain, palpitations and leg swelling.  Gastrointestinal: Negative.  Negative for heartburn, nausea and vomiting.  Musculoskeletal: Negative.  Negative for myalgias.  Skin:        SEE HPI  Neurological:  Negative.  Negative for dizziness, focal weakness, seizures and headaches.  Psychiatric/Behavioral: Negative.  Negative for suicidal ideas.    Past Medical History:  Diagnosis Date   Arrhythmia    A fib    Atrial fibrillation (HCC)    CHF (congestive heart failure) (HCC)    COPD (chronic obstructive pulmonary disease) ([place])    Diabetes mellitus without complication ([place])    Hypertension    Sleep apnea    Stroke [facility])     Past Surgical History:  Procedure Laterality Date   ABLATION     CARDIOVERSION N/A  03/12/2020   Procedure: CARDIOVERSION;  Surgeon: Skeet Latch, MD;  Location: [place];  Service: Cardiovascular;  Laterality: N/A;   CARDIOVERSION N/A 08/13/2020   Procedure: CARDIOVERSION;  Surgeon: Jolaine Artist, MD;  Location: [place];  Service: Cardiovascular;  Laterality: N/A;   CARDIOVERSION N/A 09/02/2020   Procedure: CARDIOVERSION;  Surgeon: Elouise Munroe, MD;  Location: [place];  Service: Cardiovascular;  Laterality: N/A;   INCISION AND DRAINAGE PERIRECTAL ABSCESS N/A 10/20/2012   Procedure: IRRIGATION AND DEBRIDEMENT PERIRECTAL ABSCESS;  Surgeon: Imogene Burn. Georgette Dover, MD;  Location: [place];  Service: General;  Laterality: N/A;  Lithotomy   RIGHT/LEFT HEART CATH AND CORONARY ANGIOGRAPHY N/A 08/12/2020   Procedure: RIGHT/LEFT HEART CATH AND CORONARY ANGIOGRAPHY;  Surgeon: Jolaine Artist, MD;  Location: [place] CV LAB;  Service: Cardiovascular;  Laterality: N/A;   TEE WITHOUT CARDIOVERSION N/A 03/12/2020   Procedure: TRANSESOPHAGEAL ECHOCARDIOGRAM (TEE);  Surgeon: Skeet Latch, MD;  Location: [place];  Service: Cardiovascular;  Laterality: N/A;   TEE WITHOUT CARDIOVERSION N/A 08/13/2020   Procedure: TRANSESOPHAGEAL ECHOCARDIOGRAM (TEE);  Surgeon: Jolaine Artist, MD;  Location[facility] ENDOSCOPY;  Service: Cardiovascular;  Laterality: N/A;    Family History  Problem Relation Age of Onset   Breast cancer Mother    Heart attack Father 42   Heart attack Paternal Uncle 21    Social History Reviewed with no changes to be made today.   Outpatient Medications Prior to Visit  Medication Sig Dispense Refill   Accu-Chek Softclix Lancets lancets Use as directed 100 each 0   albuterol (PROVENTIL HFA;VENTOLIN HFA) 108 (90 Base) MCG/ACT inhaler Inhale 2 puffs into the lungs every 4 (four) hours as needed for shortness of breath.     albuterol (PROVENTIL) (2.5 MG/3ML) 0.083% nebulizer solution Take 3 mLs (2.5 mg total) by nebulization every 4 (four) hours as  needed for wheezing or shortness of breath. 30 vial 0   amiodarone (PACERONE) 200 MG tablet Take 1 tablet (200 mg total) by mouth daily. 90 tablet 3   atorvastatin (LIPITOR) 20 MG tablet TAKE 1 TABLET (20 MG TOTAL) BY MOUTH DAILY (MORNING) 30 tablet 3   Blood Glucose Monitoring Suppl (BLOOD GLUCOSE MONITOR SYSTEM) w/Device KIT USE AS DIRECTED 1 kit 0   Blood Pressure Monitor DEVI Please provide patient with insurance approved blood pressure monitor ICD 10  I10 1 each 0   budesonide-formoterol (SYMBICORT) 80-4.5 MCG/ACT inhaler Inhale 2 puffs into the lungs in the morning and at bedtime. 10.2 g 0   carvedilol (COREG) 3.125 MG tablet Take 1 tablet (3.125 mg total) by mouth 2 (two) times daily. 180 tablet 3   COVID-19 mRNA bivalent vaccine, Pfizer, (PFIZER COVID-19 VAC BIVALENT) injection Inject into the muscle. 0.3 mL 0   dapagliflozin propanediol (FARXIGA) 10 MG TABS tablet Take 1 tablet (10 mg total) by mouth daily before breakfast. 30 tablet 11   digoxin (LANOXIN) 0.125 MG  tablet Take 0.5 tablets (0.0625 mg total) by mouth daily. 30 tablet 3   ELIQUIS 5 MG TABS tablet TAKE 1 TABLET (5 MG TOTAL) BY MOUTH 2 (TWO) TIMES DAILY. (AM+EVENING) 60 tablet 3   glucose blood test strip Use as directed 100 each 0   mometasone-formoterol (DULERA) 100-5 MCG/ACT AERO Inhale 1 puff into the lungs daily.     sacubitril-valsartan (ENTRESTO) 49-51 MG Take 1 tablet by mouth 2 (two) times daily. 180 tablet 3   spironolactone (ALDACTONE) 25 MG tablet TAKE 1 TABLET (25 MG TOTAL) BY MOUTH DAILY (AM) 30 tablet 3   torsemide (DEMADEX) 20 MG tablet Take 2 tablets (40 mg total) by mouth 2 (two) times daily. 120 tablet 6   carvedilol (COREG) 3.125 MG tablet Take 1 tablet (3.125 mg total) by mouth 2 (two) times daily. 60 tablet 0   potassium chloride SA (KLOR-CON M20) 20 MEQ tablet Take 1 tablet (20 mEq total) by mouth daily. 90 tablet 3   No facility-administered medications prior to visit.    No Known Allergies      Objective:    BP 116/83    Pulse 73    Ht 6' (1.829 m)    Wt (!) 360 lb 4 oz (163.4 kg)    SpO2 95%    BMI 48.86 kg/m  Wt Readings from Last 3 Encounters:  03/17/21 (!) 300 lb 6.4 oz (136.3 kg)  03/17/21 (!) 360 lb 4 oz (163.4 kg)  01/06/21 292 lb (132.5 kg)    Physical Exam Vitals and nursing note reviewed.  Constitutional:      Appearance: He is well-developed.  HENT:     Head: Normocephalic and atraumatic.  Cardiovascular:     Rate and Rhythm: Normal rate and regular rhythm.     Heart sounds: Normal heart sounds. No murmur heard.   No friction rub. No gallop.  Pulmonary:     Effort: Pulmonary effort is normal. No tachypnea or respiratory distress.     Breath sounds: Normal breath sounds. No decreased breath sounds, wheezing, rhonchi or rales.  Chest:     Chest wall: No tenderness.  Abdominal:     General: Bowel sounds are normal.     Palpations: Abdomen is soft.  Musculoskeletal:        General: Normal range of motion.     Cervical back: Normal range of motion.  Skin:    General: Skin is warm and dry.  Neurological:     Mental Status: He is alert and oriented to person, place, and time.     Coordination: Coordination normal.  Psychiatric:        Behavior: Behavior normal. Behavior is cooperative.        Thought Content: Thought content normal.        Judgment: Judgment normal.         Patient has been counseled extensively about nutrition and exercise as well as the importance of adherence with medications and regular follow-up. The patient was given clear instructions to go to ER or return to medical center if symptoms don't improve, worsen or new problems develop. The patient verbalized understanding.   Follow-up: Return in about 3 months (around 06/14/2021).   Gildardo Pounds, FNP-BC [facility], [place]   03/18/2021, 8:48 PM

## 2021-03-17 NOTE — Progress Notes (Signed)
Diabetes Self-Management Education  Visit Type: Follow-up  Appt. Start Time: 1630 Appt. End Time: 1700  03/17/2021  [name], identified by name and date of birth, is a 61 y.o. male with a diagnosis of Diabetes:  .   ASSESSMENT Pt reports getting their A1c checked today, results aren't in yet. CBG during visit was 121. Pt reports good adherence to medications. Pt has not been checking BG consistently, states that they haven't been at home and left their meter there.  Pt reports going to the gym once a week, pt does stationary bike and swims. Pt reports being able to walk a little bit further recently.  Pt reports difficulty sleeping, may only sleep for 2-3 hours. Pt needs a new CPAP, and will be trying sleeping medication. Pt reports trying meatless burgers and hot dogs now, pt enjoys them. Pt reports working on cutting back on fried foods. Pt is drinking ~6 bottles of water daily, and stopped drinking SSBs.   Height 6' (1.829 m), weight (!) 300 lb 6.4 oz (136.3 kg). Body mass index is 40.74 kg/m.   Diabetes Self-Management Education - 03/17/21 1653       Visit Information   Visit Type Follow-up      Complications   How often do you check your blood sugar? 3-4 times / week      Dietary Intake   Breakfast None    Lunch Meatless hot dogs, cauliflower    Dinner Steak, salad, baked potato    Beverage(s) water, propel water      Exercise   Exercise Type ADL's;Light (walking / raking leaves)    How many days per week to you exercise? 1    How many minutes per day do you exercise? 30    Total minutes per week of exercise 30      Individualized Goals (developed by patient)   Physical Activity Exercise 3-5 times per week    Monitoring  test my blood glucose as discussed      Patient Self-Evaluation of Goals - Patient rates self as meeting previously set goals (% of time)   Nutrition 50 - 75 %    Physical Activity 25 - 50%    Medications >75%    Monitoring < 25%     Problem Solving < 25%    Reducing Risk 25 - 50%    Health Coping < 25%      Post-Education Assessment   Patient understands the diabetes disease and treatment process. Needs Review    Patient understands incorporating nutritional management into lifestyle. Needs Review    Patient undertands incorporating physical activity into lifestyle. Needs Review    Patient understands using medications safely. Needs Review    Patient understands monitoring blood glucose, interpreting and using results Needs Review    Patient understands prevention, detection, and treatment of acute complications. Needs Review    Patient understands prevention, detection, and treatment of chronic complications. Needs Review    Patient understands how to develop strategies to address psychosocial issues. Needs Review    Patient understands how to develop strategies to promote health/change behavior. Needs Review      Outcomes   Expected Outcomes Demonstrated interest in learning. Expect positive outcomes    Future DMSE 3-4 months    Program Status Not Completed      Subsequent Visit   Since your last visit have you continued or begun to take your medications as prescribed? Yes    Since your last visit have  you had your blood pressure checked? Yes    Is your most recent blood pressure lower, unchanged, or higher since your last visit? Higher    Since your last visit have you experienced any weight changes? Gain    Weight Gain (lbs) 8    Since your last visit, are you checking your blood glucose at least once a day? No             Individualized Plan for Diabetes Self-Management Training:   Learning Objective:  Patient will have a greater understanding of diabetes self-management. Patient education plan is to attend individual and/or group sessions per assessed needs and concerns.   Plan:   Patient Instructions  Keep your blood sugar meter together with your medication, so that when you go to your aunt's  house you can check your blood sugar.  As the weather gets warmer, begin to try to walk to [facility].  Cut back on your fried and fatty foods.  Look for weight loss of no more than 1-2 pounds a week.  Check your blood sugar each morning before eating or drinking (fasting). Look for numbers between 70-100 mg/dL Your goal G6Y is below 7.0%    Expected Outcomes:  Demonstrated interest in learning. Expect positive outcomes  If problems or questions, patient to contact team via:  Phone and Email  Future DSME appointment: 3-4 months

## 2021-03-18 LAB — CMP14+EGFR
ALT: 18 IU/L (ref 0–44)
AST: 18 IU/L (ref 0–40)
Albumin/Globulin Ratio: 1.5 (ref 1.2–2.2)
Albumin: 4.6 g/dL (ref 3.8–4.9)
Alkaline Phosphatase: 86 IU/L (ref 44–121)
BUN/Creatinine Ratio: 9 (ref 9–20)
BUN: 11 mg/dL (ref 6–24)
Bilirubin Total: 0.5 mg/dL (ref 0.0–1.2)
CO2: 26 mmol/L (ref 20–29)
Calcium: 9.7 mg/dL (ref 8.7–10.2)
Chloride: 98 mmol/L (ref 96–106)
Creatinine, Ser: 1.29 mg/dL — ABNORMAL HIGH (ref 0.76–1.27)
Globulin, Total: 3.1 g/dL (ref 1.5–4.5)
Glucose: 120 mg/dL — ABNORMAL HIGH (ref 70–99)
Potassium: 4.4 mmol/L (ref 3.5–5.2)
Sodium: 141 mmol/L (ref 134–144)
Total Protein: 7.7 g/dL (ref 6.0–8.5)
eGFR: 64 mL/min/{1.73_m2} (ref >59)

## 2021-03-18 LAB — CBC
Hematocrit: 51.2 % — ABNORMAL HIGH (ref 37.5–51.0)
Hemoglobin: 17.2 g/dL (ref 13.0–17.7)
MCH: 28 pg (ref 26.6–33.0)
MCHC: 33.6 g/dL (ref 31.5–35.7)
MCV: 83 fL (ref 79–97)
Platelets: 229 10*3/uL (ref 150–450)
RBC: 6.15 x10E6/uL — ABNORMAL HIGH (ref 4.14–5.80)
RDW: 14.3 % (ref 11.6–15.4)
WBC: 6 10*3/uL (ref 3.4–10.8)

## 2021-03-18 LAB — IRON,TIBC AND FERRITIN PANEL
Ferritin: 102 ng/mL (ref 30–400)
Iron Saturation: 40 % (ref 15–55)
Iron: 138 ug/dL (ref 38–169)
Total Iron Binding Capacity: 343 ug/dL (ref 250–450)
UIBC: 205 ug/dL (ref 111–343)

## 2021-03-18 LAB — MICROALBUMIN / CREATININE URINE RATIO
Creatinine, Urine: 54.4 mg/dL
Microalb/Creat Ratio: <6 mg/g creat (ref 0–29)
Microalbumin, Urine: <3 ug/mL

## 2021-03-19 ENCOUNTER — Encounter: Payer: Self-pay | Admitting: Nurse Practitioner

## 2021-03-20 ENCOUNTER — Encounter: Payer: Self-pay | Admitting: Physician Assistant

## 2021-03-28 NOTE — Progress Notes (Signed)
? ? ? ?03/31/2021 ?[name] ?[id number] ?[birth year] ? ? ?ASSESSMENT AND PLAN:  ? ?Screen for colon cancer ?We had a long discussion about the purpose of colon cancer screening and the different methods, including FIT test, Cologuard, virtual colonoscopy, and colonoscopy, including the benefits and their inherent limitations. With shared decision making we have decided to proceed FIT test. If this is positive can consider virtual diagnostic colonoscopy versus colonoscopy in the hospital.  ?May be getting ablation and off eliquis which would decrease risk. ? ?Paroxysmal atrial fibrillation ([place]) ?On eliquis, being evaluated for ablation.  ? ?Chronic obstructive pulmonary disease, unspecified COPD type ([place]) ?With sleep apnea, not oxygen dependent ? ?Chronic systolic congestive heart failure ([place]) ?Last EF 20%- would have to be done in the hospital ? ?Aphasia as late effect of cerebrovascular accident (CVA) ?On eliquis, high risk with EF 20% and stroke history to come off eliquis. Long discussion with patient, will do FIT test per his preference and if positive consider virtual colonoscopy versus colon in hospital.  ? ? ? ?Patient Care Team: ?Gildardo Pounds, NP as PCP - General (Nurse Practitioner) ?Vickie Epley, MD as PCP - Electrophysiology (Cardiology) ? ?HISTORY OF PRESENT ILLNESS: ?61 y.o. male referred by Gildardo Pounds, NP, with a past medical history of hypertension, hyperlipidemia, OSA on CPAP, COPD, CVA, atrial fibrillation and atrial flutter with history of submassive bilateral pulmonary embolism continued on Eliquis 5 mg twice daily, chronic combined systolic and diastolic heart failure(TTE Echo 08/13/2020 ejection fraction 20% mild aortic regurgitation, no pulmonary hypertension, no aortic stenosis) and others listed below presents for evaluation of possible colonoscopy.  ? ?No family history of colon cancer, does not know his father.  ?Denies changes in his BM's, can vary with what he eats.   ?If he eats a lot of salads will have BM 2-3 a day.  ?No melena, no hematochezia, no diarrhea.  ?Has GERD if he eats spicy foods, no dysphagia, no nausea, vomiting.  ? ?They are planning on doing an ablation, states needs some weight loss.  ?Has not had any SOB or chest pain with current medications.  ? ?External labs and notes reviewed this visit: ?CBC  03/17/2021  ?HGB 17.2 MCV 83 without evidence of anemia ?WBC 6.0 Platelets 229 ?Anemia panel 03/17/2021  ?Iron 138 Ferritin 102  ?Kidney function 03/17/2021  ?BUN 11 Cr 1.29  ?GFR >60  ?Potassium 4.4   ?LFTs 03/17/2021  ?AST 18 ALT 18 ?Alkphos 86 TBili 0.5 ?09/24/2020 TSH 0.673 ? ? ?Current Medications:  ? ?Current Outpatient Medications (Endocrine & Metabolic):  ?  dapagliflozin propanediol (FARXIGA) 10 MG TABS tablet, Take 1 tablet (10 mg total) by mouth daily before breakfast. ? ?Current Outpatient Medications (Cardiovascular):  ?  amiodarone (PACERONE) 200 MG tablet, Take 1 tablet (200 mg total) by mouth daily. ?  atorvastatin (LIPITOR) 20 MG tablet, TAKE 1 TABLET (20 MG TOTAL) BY MOUTH DAILY (MORNING) ?  carvedilol (COREG) 3.125 MG tablet, Take 1 tablet (3.125 mg total) by mouth 2 (two) times daily. ?  digoxin (LANOXIN) 0.125 MG tablet, Take 0.5 tablets (0.0625 mg total) by mouth daily. ?  sacubitril-valsartan (ENTRESTO) 49-51 MG, Take 1 tablet by mouth 2 (two) times daily. ?  spironolactone (ALDACTONE) 25 MG tablet, TAKE 1 TABLET (25 MG TOTAL) BY MOUTH DAILY (AM) ?  torsemide (DEMADEX) 20 MG tablet, Take 2 tablets (40 mg total) by mouth 2 (two) times daily. ? ?Current Outpatient Medications (Respiratory):  ?  albuterol (PROVENTIL HFA;VENTOLIN HFA)  108 (90 Base) MCG/ACT inhaler, Inhale 2 puffs into the lungs every 4 (four) hours as needed for shortness of breath. ?  albuterol (PROVENTIL) (2.5 MG/3ML) 0.083% nebulizer solution, Take 3 mLs (2.5 mg total) by nebulization every 4 (four) hours as needed for wheezing or shortness of breath. ?  budesonide-formoterol  (SYMBICORT) 80-4.5 MCG/ACT inhaler, Inhale 2 puffs into the lungs in the morning and at bedtime. ?  mometasone-formoterol (DULERA) 100-5 MCG/ACT AERO, Inhale 1 puff into the lungs daily. ? ? ?Current Outpatient Medications (Hematological):  ?  ELIQUIS 5 MG TABS tablet, TAKE 1 TABLET (5 MG TOTAL) BY MOUTH 2 (TWO) TIMES DAILY. (AM+EVENING) ? ?Current Outpatient Medications (Other):  ?  Accu-Chek Softclix Lancets lancets, Use as directed ?  Blood Glucose Monitoring Suppl (BLOOD GLUCOSE MONITOR SYSTEM) w/Device KIT, USE AS DIRECTED ?  traZODone (DESYREL) 100 MG tablet, Take 0.5-1 tablets (50-100 mg total) by mouth at bedtime. ?  Blood Pressure Monitor DEVI, Please provide patient with insurance approved blood pressure monitor ICD 10  I10 ? ?Medical History:  ?Past Medical History:  ?Diagnosis Date  ? Arrhythmia   ? A fib   ? Atrial fibrillation ([place])   ? CHF (congestive heart failure) ([place])   ? COPD (chronic obstructive pulmonary disease) ([place])   ? Diabetes mellitus without complication ([place])   ? Hypertension   ? Sleep apnea   ? Stroke [facility])   ? ?Allergies: No Known Allergies  ? ?Surgical History:  ?He  has a past surgical history that includes Incision and drainage perirectal abscess (N/A, 10/20/2012); Ablation; TEE without cardioversion (N/A, 03/12/2020); Cardioversion (N/A, 03/12/2020); RIGHT/LEFT HEART CATH AND CORONARY ANGIOGRAPHY (N/A, 08/12/2020); TEE without cardioversion (N/A, 08/13/2020); Cardioversion (N/A, 08/13/2020); and Cardioversion (N/A, 09/02/2020). ?Family History:  ?His family history includes Breast cancer in his mother; Heart attack (age of onset: 28) in his father; Heart attack (age of onset: 8) in his paternal uncle. ?Social History:  ? reports that he quit smoking about 5 years ago. His smoking use included cigarettes. He smoked an average of .5 packs per day. He has never used smokeless tobacco. He reports that he does not currently use alcohol. He reports that he does not currently use drugs after  having used the following drugs: Cocaine. ? ?REVIEW OF SYSTEMS  : All other systems reviewed and negative except where noted in the History of Present Illness. ? ? ?PHYSICAL EXAM: ?BP 128/70   Pulse 75   Ht 6' (1.829 m)   Wt (!) 306 lb (138.8 kg)   BMI 41.50 kg/m?  ?General:   Pleasant, obese AA male in no acute distress ?Head:  Normocephalic and atraumatic. ?Eyes: sclerae anicteric,conjunctive pink  ?Heart:  reg reg with systolic murmur, distant heart sounds ?Pulm: Clear anteriorly; no wheezing ?Abdomen:  Soft, Obese AB, skin exam normal, Normal bowel sounds. mild tenderness in the LLQ. Without guarding and Without rebound, without hepatomegaly. ?Extremities:  Without edema. ?Msk:  Symmetrical without gross deformities. Peripheral pulses intact.  ?Neurologic:  Alert and  oriented x4; Aphasia, no focal deficiets ?Skin:   Dry and intact without significant lesions or rashes. ?Psychiatric: Demonstrates good judgement and reason without abnormal affect or behaviors. ? ? ?Vladimir Crofts, PA-C ?9:48 AM ? ? ?

## 2021-03-31 ENCOUNTER — Encounter: Payer: Self-pay | Admitting: Physician Assistant

## 2021-03-31 ENCOUNTER — Ambulatory Visit: Payer: Medicaid Other | Admitting: Physician Assistant

## 2021-03-31 ENCOUNTER — Other Ambulatory Visit: Payer: Medicaid Other

## 2021-03-31 VITALS — BP 128/70 | HR 75 | Ht 72.0 in | Wt 306.0 lb

## 2021-03-31 DIAGNOSIS — I69320 Aphasia following cerebral infarction: Secondary | ICD-10-CM

## 2021-03-31 DIAGNOSIS — J449 Chronic obstructive pulmonary disease, unspecified: Secondary | ICD-10-CM

## 2021-03-31 DIAGNOSIS — I5022 Chronic systolic (congestive) heart failure: Secondary | ICD-10-CM

## 2021-03-31 DIAGNOSIS — I480 Paroxysmal atrial fibrillation: Secondary | ICD-10-CM

## 2021-03-31 DIAGNOSIS — Z1211 Encounter for screening for malignant neoplasm of colon: Secondary | ICD-10-CM

## 2021-03-31 NOTE — Progress Notes (Signed)
____________________________________________________________ ? ?Attending physician addendum: ? ?Thank you for sending this case to me. ? ?Colorectal cancer screening is not appropriate on this patient due to the severity of his medical comorbidities and the fact that he most likely cannot be safely off oral anticoagulation for elective procedures with his history of submassive PE while off anticoagulation last year. ?I recommend not doing the FIT testing due to lack of sensitivity and specificity, and a positive result and the patient on anticoagulation will be of uncertain significance and then require high risk procedure of uncertain benefit. ? ?Wilfrid Lund, MD ? ?____________________________________________________________ ? ?

## 2021-03-31 NOTE — Patient Instructions (Addendum)
Stool DNA Testing for Colon Cancer ?Why am I having this test? ?Colon cancer is one of the most common types of cancer and a leading cause of cancer-related death. Testing for colon cancer before symptoms develop (screening) can help to find the cancer early, which leads to a better chance for effective treatment. Stool DNA testing, also called the fecal immunochemical DNA test (FIT-DNA test), is one method of screening for colon cancer. ?All adults should have colon cancer screening starting at age 28 and continuing until age 21. After age 64, you may no longer need screening based on your health. Your health care provider may also recommend screening before age 35. You will have tests every 1-10 years, depending on your results and the type of screening test. Screening may include having stool DNA testing every 3 years if: ?You have no symptoms of colon cancer. Symptoms include rectal bleeding, unexplained weight loss, and changes in bowel habits. ?You have an average risk of colon cancer. Average risk means: ?You do not have precancerous polyps. These are abnormal growths that can become cancerous (malignant). ?You do not have family or personal history of either colon cancer or a colon disease that increases your risk for colon cancer. ?You do not have a personal history of other types of cancer. ?You do not have diabetes. ?This test may be done more often for people at high risk. ?What is being tested? ?For the test, a sample of the stool is checked for blood and changes in DNA that could lead to cancer. Growths in the colon bleed and shed cells if they are cancerous or precancerous. Blood and cells can be picked up by stool as it passes through the colon. This test checks for blood cells as well as nine types of DNA (biomarkers) in three genes that have been linked to colon cancer and precancerous polyps. ?What kind of sample is taken? ?This test uses a stool sample that is collected when you have a bowel  movement. ?How do I collect samples at home? ?You will be sent a stool sample collection kit in the mail. The kit will include instructions and everything you need to get the sample. ?When collecting a stool (feces) sample at home, make sure you: ?Use supplies and instructions that you received from the lab. ?Have a bowel movement directly into a clean, dry container. Do not collect stool from the water in the toilet. ?Transfer the sample into the germ-free (sterile) cup that you received from the lab. ?Do not let any toilet paper or urine get into the cup. ?Write your information on the label of the cup. Use ink that will not smear. To do this: ?Write your full legal name. Do not write a nickname. ?Write your date of birth. ?Write the date and time that you collected the sample. ?Return the sample to the lab as told. ?The sample will be checked within about 3 days of when the lab received it. The results will be sent to your health care provider. ?How do I prepare for this test? ?There is no preparation needed for this test. However, do not collect a stool sample if: ?You have bleeding hemorrhoids. These are swollen veins in and around the rectum or the opening between the buttocks (anus). ?You have any rectal bleeding. ?You have a cut on your hand or finger. ?You have your menstrual period. ?You have diarrhea. ?How are the results reported? ?Your test results will be reported as either positive or negative. ?Sometimes, the  test results may report that a condition is present when it is not present (false-positive result). ?Sometimes, the test results may report that a condition is not present when it is present (false-negative result). ?What do the results mean? ?A positive result means that the test found abnormal DNA or blood cells, or both. If you have a positive result, you will need to have a follow-up exam of your colon done with a scope (colonoscopy). ?A negative result means that no blood or changes in DNA  were found. This does not guarantee that you do not have colon cancer. Your health care provider may recommend that you have other screening tests. ?Talk with your health care provider about what your results mean. Ask if further testing needs to be done and ask about the timing of those tests. ?Questions to ask your health care provider ?Ask your health care provider, or the department that is doing the test: ?When will my results be ready? ?How will I get my results? ?What are my treatment options? ?What other tests do I need? ?What are my next steps? ?Summary ?Stool DNA testing, also called the FIT-DNA test, is one method of screening for colon cancer. ?For the test, a sample of the stool (feces) is checked for blood and changes in DNA that could lead to cancer. ?Do not collect astool sample if you have bleeding hemorrhoids, rectal bleeding, a cut on your hand or finger, your menstrual period, or diarrhea. ?Your test results will be reported as either positive or negative. ?This information is not intended to replace advice given to you by your health care provider. Make sure you discuss any questions you have with your health care provider. ?Document Revised: 10/12/2019 Document Reviewed: 05/03/2019 ?Elsevier Patient Education ? [place]. ? ? ?FIBER SUPPLEMENT ? ?Benefiber or Citracel is good for constipation/diarrhea/irritable bowel syndrome, it helps with weight loss and can help lower your bad cholesterol. Please do 1 TBSP in the morning in water, coffee, or tea. It can take up to a month before you can see a difference with your bowel movements. It is cheapest from [employer], [employer], [employer].  ? ? ?Your provider has requested that you go to the basement level for lab work before leaving today. Press "B" on the elevator. The lab is located at the first door on the left as you exit the elevator.  ? ?Due to recent changes in healthcare laws, you may see the results of your imaging and laboratory studies on  MyChart before your provider has had a chance to review them.  We understand that in some cases there may be results that are confusing or concerning to you. Not all laboratory results come back in the same time frame and the provider may be waiting for multiple results in order to interpret others.  Please give [place] 48 hours in order for your provider to thoroughly review all the results before contacting the office for clarification of your results.   ? ?If you are age 16 or older, your body mass index should be between 23-30. Your Body mass index is 41.5 kg/m?Marland Kitchen If this is out of the aforementioned range listed, please consider follow up with your Primary Care Provider. ? ?If you are age 51 or younger, your body mass index should be between 19-25. Your Body mass index is 41.5 kg/m?Marland Kitchen If this is out of the aformentioned range listed, please consider follow up with your Primary Care Provider.  ? ?________________________________________________________ ? ?The [facility]  providers would like to encourage you to use [facility] to communicate with providers for non-urgent requests or questions.  Due to long hold times on the telephone, sending your provider a message by [facility] may be a faster and more efficient way to get a response.  Please allow 48 business hours for a response.  Please remember that this is for non-urgent requests.  ?_______________________________________________________  ? ?I appreciate the  opportunity to care for you ? ?Thank You  ? ?Amanda Collier,PA-C  ?

## 2021-04-01 ENCOUNTER — Ambulatory Visit: Payer: Medicaid Other | Admitting: Cardiology

## 2021-04-01 NOTE — Patient Instructions (Incomplete)

## 2021-04-06 NOTE — Addendum Note (Signed)
Addended by: Quentin Mulling on: 04/06/2021 01:19 PM ? ? Modules accepted: Orders ? ?

## 2021-04-07 ENCOUNTER — Telehealth: Payer: Self-pay | Admitting: Physician Assistant

## 2021-04-07 NOTE — Telephone Encounter (Signed)
Left message for patient to reach me at the office.  ?Discussed further with Dr. Myrtie Neither and canceled FIT test, will discuss with patient.  ?

## 2021-04-25 ENCOUNTER — Other Ambulatory Visit (HOSPITAL_COMMUNITY): Payer: Self-pay | Admitting: Adult Health

## 2021-05-19 ENCOUNTER — Telehealth: Payer: Self-pay | Admitting: Physician Assistant

## 2021-05-19 NOTE — Telephone Encounter (Signed)
Pt came into the office today inquiring about a stool test that he has to have done. I saw Amanda's message from 04/07/21 were she indicated that she needs to discuss further with pt about cancelling stool test. Pls call pt again. ?

## 2021-05-20 ENCOUNTER — Ambulatory Visit: Payer: Medicaid Other | Attending: Nurse Practitioner | Admitting: Nurse Practitioner

## 2021-05-20 ENCOUNTER — Encounter: Payer: Self-pay | Admitting: Nurse Practitioner

## 2021-05-20 ENCOUNTER — Telehealth: Payer: Self-pay | Admitting: Nurse Practitioner

## 2021-05-20 DIAGNOSIS — Z0289 Encounter for other administrative examinations: Secondary | ICD-10-CM | POA: Diagnosis not present

## 2021-05-20 DIAGNOSIS — E1165 Type 2 diabetes mellitus with hyperglycemia: Secondary | ICD-10-CM | POA: Diagnosis not present

## 2021-05-20 MED ORDER — OZEMPIC (0.25 OR 0.5 MG/DOSE) 2 MG/1.5ML ~~LOC~~ SOPN: 0.5000 mg | PEN_INJECTOR | SUBCUTANEOUS | 1 refills | Status: DC

## 2021-05-20 NOTE — Telephone Encounter (Signed)
NO answer 9:05am LVM ? ?No answer 9:16 am LVM ?

## 2021-05-20 NOTE — Progress Notes (Signed)
Virtual Visit via Telephone Note ? I discussed the limitations, risks, security and privacy concerns of performing an evaluation and management service by telephone and the availability of in person appointments. I also discussed with the patient that there may be a patient responsible charge related to this service. The patient expressed understanding and agreed to proceed.  ? ? ?I connected with Donald [name] on 05/20/21  at   8:50 AM EDT  EDT by telephone and verified that I am speaking with the correct person using two identifiers. ? ?Location of Patient: ?Private Residence ?  ?Location of Provider: ?[employer] and [employer]  ?  ?Persons participating in Telemedicine visit: ?[name] FNP-BC ?Donald [name]  ?  ?History of Present Illness: ?Telemedicine visit for: DM 2 and completion of form ? ?PMH: Afib, aflutter, chronic combined systolic and diastolic heart failure, submassive B/L pulmonary embolism, COPD, hypertension, stroke, hyperlipidemia, OSA on CPAP ?  ?He is currently being followed by  Cardiology for afib/flutter. S/p cardioversion 09-02-2020. Currently remains in NSR. Continues on eliquis 5 mg BID.  ?  ?He is requesting a letter to be submitted to his [employer] stating he is medically cleared to attend school.  ? ?DM2 ?Not quite at goal. He is interested in starting ozempic. BMI 48.86 ?Currently taking farxiga 10 mg daily. Denies any symptoms of hypo or hyperglycemia. LDL at goal with atorvastatin 20 mg daily.  ?Lab Results  ?Component Value Date  ? HGBA1C 7.3 (A) 03/17/2021  ?  ? ? ? ? ? ?Past Medical History:  ?Diagnosis Date  ? Arrhythmia   ? A fib   ? Atrial fibrillation (HCC)   ? CHF (congestive heart failure) (HCC)   ? COPD (chronic obstructive pulmonary disease) (HCC)   ? Diabetes mellitus without complication (HCC)   ? Hypertension   ? Sleep apnea   ? Stroke [facility])   ?  ?Past Surgical History:  ?Procedure Laterality Date  ? ABLATION    ? CARDIOVERSION N/A 03/12/2020  ?  Procedure: CARDIOVERSION;  Surgeon: Chilton Si, MD;  Location: [facility] ENDOSCOPY;  Service: Cardiovascular;  Laterality: N/A;  ? CARDIOVERSION N/A 08/13/2020  ? Procedure: CARDIOVERSION;  Surgeon: Dolores Patty, MD;  Location: [facility] ENDOSCOPY;  Service: Cardiovascular;  Laterality: N/A;  ? CARDIOVERSION N/A 09/02/2020  ? Procedure: CARDIOVERSION;  Surgeon: Parke Poisson, MD;  Location: [facility] ENDOSCOPY;  Service: Cardiovascular;  Laterality: N/A;  ? INCISION AND DRAINAGE PERIRECTAL ABSCESS N/A 10/20/2012  ? Procedure: IRRIGATION AND DEBRIDEMENT PERIRECTAL ABSCESS;  Surgeon: Wilmon Arms. Corliss Skains, MD;  Location: [facility] OR;  Service: General;  Laterality: N/A;  Lithotomy  ? RIGHT/LEFT HEART CATH AND CORONARY ANGIOGRAPHY N/A 08/12/2020  ? Procedure: RIGHT/LEFT HEART CATH AND CORONARY ANGIOGRAPHY;  Surgeon: Dolores Patty, MD;  Location: [facility] INVASIVE CV LAB;  Service: Cardiovascular;  Laterality: N/A;  ? TEE WITHOUT CARDIOVERSION N/A 03/12/2020  ? Procedure: TRANSESOPHAGEAL ECHOCARDIOGRAM (TEE);  Surgeon: Chilton Si, MD;  Location: [facility] ENDOSCOPY;  Service: Cardiovascular;  Laterality: N/A;  ? TEE WITHOUT CARDIOVERSION N/A 08/13/2020  ? Procedure: TRANSESOPHAGEAL ECHOCARDIOGRAM (TEE);  Surgeon: Dolores Patty, MD;  Location: [facility] ENDOSCOPY;  Service: Cardiovascular;  Laterality: N/A;  ?  ?Family History  ?Problem Relation Age of Onset  ? Breast cancer Mother   ? Heart attack Father 16  ? Heart attack Paternal Uncle 24  ?  ?Social History  ? ?Socioeconomic History  ? Marital status: Single  ?  Spouse name: Not on file  ? Number of children:  Not on file  ? Years of education: Not on file  ? Highest education level: Not on file  ?Occupational History  ? Not on file  ?Tobacco Use  ? Smoking status: Former  ?  Packs/day: 0.50  ?  Types: Cigarettes  ?  Quit date: 2018  ?  Years since quitting: 5.3  ? Smokeless tobacco: Never  ?Substance and Sexual Activity  ? Alcohol use: Not Currently  ? Drug use: Not Currently  ?  Types:  Cocaine  ? Sexual activity: Not Currently  ?Other Topics Concern  ? Not on file  ?Social History Narrative  ? Not on file  ? ?Social Determinants of Health  ? ?Financial Resource Strain: Medium Risk  ? Difficulty of Paying Living Expenses: Somewhat hard  ?Food Insecurity: Food Insecurity Present  ? Worried About Charity fundraiser in the Last Year: Never true  ? Ran Out of Food in the Last Year: Sometimes true  ?Transportation Needs: Unmet Transportation Needs  ? Lack of Transportation (Medical): Yes  ? Lack of Transportation (Non-Medical): Yes  ?Physical Activity: Insufficiently Active  ? Days of Exercise per Week: 3 days  ? Minutes of Exercise per Session: 20 min  ?Stress: No Stress Concern Present  ? Feeling of Stress : Only a little  ?Social Connections: Moderately Isolated  ? Frequency of Communication with Friends and Family: More than three times a week  ? Frequency of Social Gatherings with Friends and Family: More than three times a week  ? Attends Religious Services: 1 to 4 times per year  ? Active Member of Clubs or Organizations: No  ? Attends Archivist Meetings: Never  ? Marital Status: Never married  ?  ? ?Observations/Objective: ?Awake, alert and oriented x 3 ? ? ?Review of Systems  ?Constitutional:  Negative for fever, malaise/fatigue and weight loss.  ?HENT: Negative.  Negative for nosebleeds.   ?Eyes: Negative.  Negative for blurred vision, double vision and photophobia.  ?Respiratory: Negative.  Negative for cough and shortness of breath.   ?Cardiovascular: Negative.  Negative for chest pain, palpitations and leg swelling.  ?Gastrointestinal: Negative.  Negative for heartburn, nausea and vomiting.  ?Musculoskeletal: Negative.  Negative for myalgias.  ?Neurological: Negative.  Negative for dizziness, focal weakness, seizures and headaches.  ?Psychiatric/Behavioral: Negative.  Negative for suicidal ideas.    ?Assessment and Plan: ?Diagnoses and all orders for this visit: ? ?Type 2  diabetes mellitus with hyperglycemia, without long-term current use of insulin ([place]) ?-     Semaglutide,0.25 or 0.5MG /DOS, (OZEMPIC, 0.25 OR 0.5 MG/DOSE,) 2 MG/1.5ML SOPN; Inject 0.5 mg into the skin once a week. ? ?Encounter for completion of form with patient ?Letter to be faxed to Financial Aid department ?  ? ?Follow Up Instructions ?Return in about 3 months (around 08/19/2021) for DM.  ? ?  ?I discussed the assessment and treatment plan with the patient. The patient was provided an opportunity to ask questions and all were answered. The patient agreed with the plan and demonstrated an understanding of the instructions. ?  ?The patient was advised to call back or seek an in-person evaluation if the symptoms worsen or if the condition fails to improve as anticipated. ? ?I provided 12 minutes of non-face-to-face time during this encounter including median intraservice time, reviewing previous notes, labs, imaging, medications and explaining diagnosis and management. ? ?Gildardo Pounds, FNP-BC  ?

## 2021-05-21 ENCOUNTER — Ambulatory Visit: Payer: Medicaid Other | Admitting: Dietician

## 2021-05-21 NOTE — Telephone Encounter (Signed)
Left message for patient to call back  

## 2021-05-22 ENCOUNTER — Ambulatory Visit: Payer: Self-pay | Admitting: Family

## 2021-05-22 NOTE — Telephone Encounter (Signed)
Left message for patient to call back  

## 2021-05-23 ENCOUNTER — Other Ambulatory Visit: Payer: Self-pay | Admitting: Nurse Practitioner

## 2021-05-23 ENCOUNTER — Other Ambulatory Visit (HOSPITAL_COMMUNITY): Payer: Self-pay | Admitting: Adult Health

## 2021-05-23 ENCOUNTER — Other Ambulatory Visit (HOSPITAL_COMMUNITY): Payer: Self-pay | Admitting: Family Medicine

## 2021-05-23 NOTE — Telephone Encounter (Signed)
Spoke with patient regarding stool test cancellation & both Marchelle Folks, [place] and Dr. Irving Burton recommendations. Patient verbalized understanding, no further questions.  ?

## 2021-06-03 ENCOUNTER — Telehealth: Payer: Self-pay | Admitting: Nurse Practitioner

## 2021-06-03 ENCOUNTER — Encounter: Payer: Self-pay | Admitting: Nurse Practitioner

## 2021-06-03 ENCOUNTER — Other Ambulatory Visit: Payer: Self-pay | Admitting: Nurse Practitioner

## 2021-06-03 DIAGNOSIS — E1165 Type 2 diabetes mellitus with hyperglycemia: Secondary | ICD-10-CM

## 2021-06-03 NOTE — Telephone Encounter (Signed)
Patient called asking for a refill on their Ozempic. ?

## 2021-06-05 NOTE — Telephone Encounter (Signed)
Refilled on 06/04/2021 ?

## 2021-06-16 ENCOUNTER — Ambulatory Visit: Payer: Medicaid Other | Admitting: Dietician

## 2021-07-14 ENCOUNTER — Other Ambulatory Visit: Payer: Self-pay | Admitting: Nurse Practitioner

## 2021-07-14 ENCOUNTER — Other Ambulatory Visit (HOSPITAL_COMMUNITY): Payer: Self-pay

## 2021-07-14 MED ORDER — BLOOD PRESSURE MONITOR DEVI: 0 refills | Status: DC

## 2021-07-14 MED ORDER — TRAZODONE HCL 100 MG PO TABS: 50.0000 mg | ORAL_TABLET | Freq: Every day | ORAL | 0 refills | Status: DC

## 2021-07-14 MED ORDER — TRAZODONE HCL 100 MG PO TABS: 50.0000 mg | ORAL_TABLET | Freq: Every day | ORAL | 1 refills | Status: DC

## 2021-07-14 MED ORDER — ALBUTEROL SULFATE HFA 108 (90 BASE) MCG/ACT IN AERS: 2.0000 | INHALATION_SPRAY | RESPIRATORY_TRACT | 1 refills | Status: AC | PRN

## 2021-07-15 ENCOUNTER — Other Ambulatory Visit (HOSPITAL_COMMUNITY): Payer: Self-pay | Admitting: Internal Medicine

## 2021-07-17 ENCOUNTER — Telehealth: Payer: Self-pay

## 2021-07-17 NOTE — Progress Notes (Signed)
PCP:  Claiborne Rigg, NP Primary Cardiologist: None Electrophysiologist: Lanier Prude, MD   Donald [name] is a 61 y.o. male seen today for Lanier Prude, MD for routine electrophysiology followup.  Since last being seen in our clinic the patient reports doing very well. He has not felt any recurrent arrhythmias. He is working as a Scientist, water quality at [employer] Does OK with the "short walk" but has to take a break if he has to walk to or from the further lot. 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 he denies chest pain, palpitations, PND, orthopnea, nausea, vomiting, dizziness, syncope, edema, weight gain, or early satiety.  Past Medical History:  Diagnosis Date   Arrhythmia    A fib    Atrial fibrillation (HCC)    CHF (congestive heart failure) (HCC)    COPD (chronic obstructive pulmonary disease) (HCC)    Diabetes mellitus without complication (HCC)    Hypertension    Sleep apnea    Stroke [facility])    Past Surgical History:  Procedure Laterality Date   ABLATION     CARDIOVERSION N/A 03/12/2020   Procedure: CARDIOVERSION;  Surgeon: Chilton Si, MD;  Location: [facility] ENDOSCOPY;  Service: Cardiovascular;  Laterality: N/A;   CARDIOVERSION N/A 08/13/2020   Procedure: CARDIOVERSION;  Surgeon: Dolores Patty, MD;  Location: [facility] ENDOSCOPY;  Service: Cardiovascular;  Laterality: N/A;   CARDIOVERSION N/A 09/02/2020   Procedure: CARDIOVERSION;  Surgeon: Parke Poisson, MD;  Location: [facility] ENDOSCOPY;  Service: Cardiovascular;  Laterality: N/A;   INCISION AND DRAINAGE PERIRECTAL ABSCESS N/A 10/20/2012   Procedure: IRRIGATION AND DEBRIDEMENT PERIRECTAL ABSCESS;  Surgeon: Wilmon Arms. Corliss Skains, MD;  Location: [facility] OR;  Service: General;  Laterality: N/A;  Lithotomy   RIGHT/LEFT HEART CATH AND CORONARY ANGIOGRAPHY N/A 08/12/2020   Procedure: RIGHT/LEFT HEART CATH AND CORONARY ANGIOGRAPHY;  Surgeon: Dolores Patty, MD;  [facility] INVASIVE CV LAB;  Service:  Cardiovascular;  Laterality: N/A;   TEE WITHOUT CARDIOVERSION N/A 03/12/2020   Procedure: TRANSESOPHAGEAL ECHOCARDIOGRAM (TEE);  Surgeon: Chilton Si, MD;  Location: [facility] ENDOSCOPY;  Service: Cardiovascular;  Laterality: N/A;   TEE WITHOUT CARDIOVERSION N/A 08/13/2020   Procedure: TRANSESOPHAGEAL ECHOCARDIOGRAM (TEE);  Surgeon: Dolores Patty, MD;  Location: [facility] ENDOSCOPY;  Service: Cardiovascular;  Laterality: N/A;    Current Outpatient Medications  Medication Sig Dispense Refill   Accu-Chek Softclix Lancets lancets Use as directed 100 each 0   albuterol (PROVENTIL) (2.5 MG/3ML) 0.083% nebulizer solution Take 3 mLs (2.5 mg total) by nebulization every 4 (four) hours as needed for wheezing or shortness of breath. 30 vial 0   albuterol (VENTOLIN HFA) 108 (90 Base) MCG/ACT inhaler Inhale 2 puffs into the lungs every 4 (four) hours as needed for shortness of breath. 18 g 1   amiodarone (PACERONE) 200 MG tablet Take 1 tablet (200 mg total) by mouth daily. 90 tablet 3   apixaban (ELIQUIS) 5 MG TABS tablet Take 1 tablet (5 mg total) by mouth 2 (two) times daily. NEEDS FOLLOW UP APPOINTMENT FOR ANYMORE REFILLS 60 tablet 1   atorvastatin (LIPITOR) 20 MG tablet Take 1 tablet (20 mg total) by mouth daily. NEEDS FOLLOW UP APPOINTMENT FOR ANYMORE REFILLS 30 tablet 1   Blood Glucose Monitoring Suppl (BLOOD GLUCOSE MONITOR SYSTEM) w/Device KIT USE AS DIRECTED 1 kit 0   Blood Pressure Monitor DEVI Please provide patient with insurance approved blood pressure monitor ICD 10  I10 1 each 0   carvedilol (COREG) 3.125  MG tablet Take 1 tablet (3.125 mg total) by mouth 2 (two) times daily. 180 tablet 3   dapagliflozin propanediol (FARXIGA) 10 MG TABS tablet Take 1 tablet (10 mg total) by mouth daily before breakfast. 30 tablet 11   digoxin (LANOXIN) 0.125 MG tablet Take 0.5 tablets (0.0625 mg total) by mouth daily. NEEDS FOLLOW UP APPOINTMENT FOR ANYMORE REFILLS 30 tablet 1   mometasone-formoterol (DULERA) 100-5  MCG/ACT AERO Inhale 1 puff into the lungs daily.     sacubitril-valsartan (ENTRESTO) 49-51 MG Take 1 tablet by mouth 2 (two) times daily. 180 tablet 3   Semaglutide,0.25 or 0.5MG /DOS, (OZEMPIC, 0.25 OR 0.5 MG/DOSE,) 2 MG/3ML SOPN INJECT 0.5 MG INTO THE SKIN ONCE A WEEK. 3 mL 1   spironolactone (ALDACTONE) 25 MG tablet Take 1 tablet (25 mg total) by mouth daily. NEEDS FOLLOW UP APPOINTMENT FOR ANYMORE REFILLS 30 tablet 4   torsemide (DEMADEX) 20 MG tablet Take 1 tablet (20 mg total) by mouth 2 (two) times daily. NEEDS FOLLOW UP APPOINTMENT FOR ANYMORE REFILLS 180 tablet 0   traZODone (DESYREL) 100 MG tablet Take 0.5-1 tablets (50-100 mg total) by mouth at bedtime. 90 tablet 0   traZODone (DESYREL) 100 MG tablet Take 0.5-1 tablets (50-100 mg total) by mouth at bedtime. 90 tablet 1   budesonide-formoterol (SYMBICORT) 80-4.5 MCG/ACT inhaler Inhale 2 puffs into the lungs in the morning and at bedtime. 10.2 g 0   No current facility-administered medications for this visit.    No Known Allergies  Social History   Socioeconomic History   Marital status: Single    Spouse name: Not on file   Number of children: Not on file   Years of education: Not on file   Highest education level: Not on file  Occupational History   Not on file  Tobacco Use   Smoking status: Former    Packs/day: 0.50    Types: Cigarettes    Quit date: 2018    Years since quitting: 5.4   Smokeless tobacco: Never  Substance and Sexual Activity   Alcohol use: Not Currently   Drug use: Not Currently    Types: Cocaine   Sexual activity: Not Currently  Other Topics Concern   Not on file  Social History Narrative   Not on file   Social Determinants of Health   Financial Resource Strain: Medium Risk (08/14/2020)   Overall Financial Resource Strain (CARDIA)    Difficulty of Paying Living Expenses: Somewhat hard  Food Insecurity: Food Insecurity Present (08/14/2020)   Hunger Vital Sign    Worried About Running Out of Food  in the Last Year: Never true    Ran Out of Food in the Last Year: Sometimes true  Transportation Needs: Unmet Transportation Needs (08/14/2020)   PRAPARE - Transportation    Lack of Transportation (Medical): Yes    Lack of Transportation (Non-Medical): Yes  Physical Activity: Insufficiently Active (11/12/2020)   Exercise Vital Sign    Days of Exercise per Week: 3 days    Minutes of Exercise per Session: 20 min  Stress: No Stress Concern Present (11/12/2020)   [employer] of Occupational Health - Occupational Stress Questionnaire    Feeling of Stress : Only a little  Social Connections: Moderately Isolated (09/23/2020)   Social Connection and Isolation Panel [NHANES]    Frequency of Communication with Friends and Family: More than three times a week    Frequency of Social Gatherings with Friends and Family: More than three times a week  Attends Religious Services: 1 to 4 times per year    Active Member of Clubs or Organizations: No    Attends Banker Meetings: Never    Marital Status: Never married  Intimate Partner Violence: Not At Risk (09/23/2020)   Humiliation, Afraid, Rape, and Kick questionnaire    Fear of Current or Ex-Partner: No    Emotionally Abused: No    Physically Abused: No    Sexually Abused: No    Review of Systems: All other systems reviewed and are otherwise negative except as noted above.  Physical Exam: Vitals:   07/18/21 0810  BP: 100/60  Pulse: 76  SpO2: 92%  Weight: 294 lb 9.6 oz (133.6 kg)  Height: 6' (1.829 m)    GEN- The patient is well appearing, alert and oriented x 3 today.   HEENT: normocephalic, atraumatic; sclera clear, conjunctiva pink; hearing intact; oropharynx clear; neck supple, no JVP Lymph- no cervical lymphadenopathy Lungs- Clear to ausculation bilaterally, normal work of breathing.  No wheezes, rales, rhonchi Heart- Regular rate and rhythm, no murmurs, rubs or gallops, PMI not laterally displaced GI- soft,  non-tender, non-distended, bowel sounds present, no hepatosplenomegaly Extremities- no clubbing, cyanosis, or edema; DP/PT/radial pulses 2+ bilaterally MS- no significant deformity or atrophy Skin- warm and dry, no rash or lesion Psych- euthymic mood, full affect Neuro- strength and sensation are intact  EKG is ordered. Personal review of EKG from today shows NSR at 76 bpm  Additional studies reviewed include: Previous EP office notes.   Assessment and Plan:  1. Paroxysmal atrial fibrillation (HCC) 2. Atypical atrial flutter (HCC)  He has not felt any recurrence He is still tentatively interested in repeat ablation; which per Dr. Geannie Risen notes would include a PVI, posterior wall ablation and CTI.   Continue amiodarone 200 mg daily for now. Surveillance labs today.  Update echo as below.    3. Chronic systolic heart failure (HCC) NYHA class II-III symptoms.  Exercising regularly.   Continue coreg 3.125 mg BID Continue Entresto 49/51 mg BID Continue spironolactone 25 mg daily We will plan to update his Echo and revisit ablation vs ICD with Dr. Lalla Brothers.    4. Pulmonary embolism without acute cor pulmonale, unspecified chronicity, unspecified pulmonary embolism type (HCC) Continue OAC  Follow up with Dr. Lalla Brothers in 3 months   Graciella Freer, PA-C  07/18/21 8:13 AM

## 2021-07-18 ENCOUNTER — Encounter: Payer: Self-pay | Admitting: Student

## 2021-07-18 ENCOUNTER — Ambulatory Visit (INDEPENDENT_AMBULATORY_CARE_PROVIDER_SITE_OTHER): Payer: Medicaid Other | Admitting: Student

## 2021-07-18 VITALS — BP 100/60 | HR 76 | Ht 72.0 in | Wt 294.6 lb

## 2021-07-18 DIAGNOSIS — G4733 Obstructive sleep apnea (adult) (pediatric): Secondary | ICD-10-CM

## 2021-07-18 DIAGNOSIS — I4891 Unspecified atrial fibrillation: Secondary | ICD-10-CM

## 2021-07-18 DIAGNOSIS — I2699 Other pulmonary embolism without acute cor pulmonale: Secondary | ICD-10-CM

## 2021-07-18 DIAGNOSIS — I5022 Chronic systolic (congestive) heart failure: Secondary | ICD-10-CM

## 2021-07-18 DIAGNOSIS — I484 Atypical atrial flutter: Secondary | ICD-10-CM

## 2021-07-19 LAB — CBC
Hematocrit: 45.7 % (ref 37.5–51.0)
Hemoglobin: 15.2 g/dL (ref 13.0–17.7)
MCH: 28 pg (ref 26.6–33.0)
MCHC: 33.3 g/dL (ref 31.5–35.7)
MCV: 84 fL (ref 79–97)
Platelets: 250 10*3/uL (ref 150–450)
RBC: 5.43 x10E6/uL (ref 4.14–5.80)
RDW: 13.5 % (ref 11.6–15.4)
WBC: 7.3 10*3/uL (ref 3.4–10.8)

## 2021-07-19 LAB — TSH: TSH: 0.517 u[IU]/mL (ref 0.450–4.500)

## 2021-07-19 LAB — COMPREHENSIVE METABOLIC PANEL
ALT: 17 IU/L (ref 0–44)
AST: 20 IU/L (ref 0–40)
Albumin/Globulin Ratio: 1.6 (ref 1.2–2.2)
Albumin: 4.4 g/dL (ref 3.8–4.9)
Alkaline Phosphatase: 94 IU/L (ref 44–121)
BUN/Creatinine Ratio: 16 (ref 9–20)
BUN: 18 mg/dL (ref 6–24)
Bilirubin Total: 0.3 mg/dL (ref 0.0–1.2)
CO2: 31 mmol/L — ABNORMAL HIGH (ref 20–29)
Calcium: 9.5 mg/dL (ref 8.7–10.2)
Chloride: 98 mmol/L (ref 96–106)
Creatinine, Ser: 1.11 mg/dL (ref 0.76–1.27)
Globulin, Total: 2.7 g/dL (ref 1.5–4.5)
Glucose: 108 mg/dL — ABNORMAL HIGH (ref 70–99)
Potassium: 3.8 mmol/L (ref 3.5–5.2)
Sodium: 142 mmol/L (ref 134–144)
Total Protein: 7.1 g/dL (ref 6.0–8.5)
eGFR: 76 mL/min/{1.73_m2} (ref >59)

## 2021-07-19 LAB — T4, FREE: Free T4: 1.97 ng/dL — ABNORMAL HIGH (ref 0.82–1.77)

## 2021-08-02 ENCOUNTER — Other Ambulatory Visit: Payer: Self-pay

## 2021-08-06 ENCOUNTER — Ambulatory Visit (HOSPITAL_COMMUNITY): Payer: Medicaid Other | Attending: Cardiology

## 2021-08-06 DIAGNOSIS — I5022 Chronic systolic (congestive) heart failure: Secondary | ICD-10-CM | POA: Diagnosis not present

## 2021-08-06 LAB — ECHOCARDIOGRAM COMPLETE
Area-P 1/2: 2.9 cm2
P 1/2 time: 796 msec
S' Lateral: 3.5 cm

## 2021-08-18 ENCOUNTER — Ambulatory Visit: Payer: Medicaid Other | Admitting: Nurse Practitioner

## 2021-09-02 ENCOUNTER — Other Ambulatory Visit (HOSPITAL_COMMUNITY): Payer: Self-pay | Admitting: Internal Medicine

## 2021-09-02 ENCOUNTER — Other Ambulatory Visit: Payer: Self-pay | Admitting: Family Medicine

## 2021-09-02 ENCOUNTER — Other Ambulatory Visit (HOSPITAL_COMMUNITY): Payer: Self-pay | Admitting: Adult Health

## 2021-09-02 DIAGNOSIS — E1165 Type 2 diabetes mellitus with hyperglycemia: Secondary | ICD-10-CM

## 2021-09-02 NOTE — Telephone Encounter (Signed)
Requested Prescriptions  Pending Prescriptions Disp Refills  . OZEMPIC, 0.25 OR 0.5 MG/DOSE, 2 MG/3ML SOPN [Pharmacy Med Name: OZEMPIC (0.25 OR 0.5 MG/DOSE) 2 MG/3ML SUBCUTANEOUS SOLUTION PEN-INJECTOR] 3 mL 1    Sig: INJECT 0.5 MG INTO THE SKIN ONCE A WEEK.     Endocrinology:  Diabetes - GLP-1 Receptor Agonists - semaglutide Failed - 09/02/2021  1:56 PM      Failed - HBA1C in normal range and within 180 days    Hemoglobin A1C  Date Value Ref Range Status  03/17/2021 7.3 (A) 4.0 - 5.6 % Final   Hgb A1c MFr Bld  Date Value Ref Range Status  11/08/2020 8.3 (H) 4.8 - 5.6 % Final    Comment:             Prediabetes: 5.7 - 6.4          Diabetes: >6.4          Glycemic control for adults with diabetes: <7.0          Passed - Cr in normal range and within 360 days    Creatinine, Ser  Date Value Ref Range Status  07/18/2021 1.11 0.76 - 1.27 mg/dL Final         Passed - Valid encounter within last 6 months    Recent Outpatient Visits          3 months ago Type 2 diabetes mellitus with hyperglycemia, without long-term current use of insulin [facility])   [place] [facility] [place], [place] W, NP   5 months ago Type 2 diabetes mellitus with hyperglycemia, without long-term current use of insulin [facility])   [place] [facility] [place], Shea Stakes, NP   10 months ago Encounter to establish care   [facility] [place], Shea Stakes, NP      Future Appointments            In 2 weeks Lanier Prude, MD [facility] [address] Office, [id number]

## 2021-09-19 ENCOUNTER — Ambulatory Visit: Payer: Medicaid Other | Admitting: Cardiology

## 2021-10-28 ENCOUNTER — Other Ambulatory Visit (HOSPITAL_COMMUNITY): Payer: Self-pay | Admitting: Family Medicine

## 2021-10-28 DIAGNOSIS — I5043 Acute on chronic combined systolic (congestive) and diastolic (congestive) heart failure: Secondary | ICD-10-CM

## 2021-10-28 DIAGNOSIS — I4891 Unspecified atrial fibrillation: Secondary | ICD-10-CM

## 2021-11-12 ENCOUNTER — Telehealth: Payer: Medicaid Other | Admitting: Nurse Practitioner

## 2021-11-12 ENCOUNTER — Ambulatory Visit: Payer: Self-pay | Admitting: *Deleted

## 2021-11-12 DIAGNOSIS — J069 Acute upper respiratory infection, unspecified: Secondary | ICD-10-CM | POA: Diagnosis not present

## 2021-11-12 NOTE — Progress Notes (Signed)
Unable to reach patient.

## 2021-11-12 NOTE — Progress Notes (Signed)
Virtual Visit Consent   Donald [name], you are scheduled for a virtual visit with Donald [name], Donald [name], a [facility] provider, [name].     Just as with appointments in the office, your consent must be obtained to participate.  Your consent will be active for this visit and any virtual visit you may have with one of our providers in the next 365 days.     If you have a MyChart account, a copy of this consent can be sent to you electronically.  All virtual visits are billed to your insurance company just like a traditional visit in the office.    As this is a virtual visit, video technology does not allow for your provider to perform a traditional examination.  This may limit your provider's ability to fully assess your condition.  If your provider identifies any concerns that need to be evaluated in person or the need to arrange testing (such as labs, EKG, etc.), we will make arrangements to do so.     Although advances in technology are sophisticated, we cannot ensure that it will always work on either your end or our end.  If the connection with a video visit is poor, the visit may have to be switched to a telephone visit.  With either a video or telephone visit, we are not always able to ensure that we have a secure connection.     I need to obtain your verbal consent now.   Are you willing to proceed with your visit [name]? YES   Donald [name] has provided verbal consent on 11/12/2021 for a virtual visit (video or telephone).   Donald Hassell Done, FNP   Date: 11/12/2021 11:13 AM   Virtual Visit via Video Note   I, Donald [name], connected with Donald [name] ([id number], 09/26/1960) on 11/12/21 at 11:15 AM EDT by a video-enabled telemedicine application and verified that I am speaking with the correct person using two identifiers.  Location: Patient: Virtual Visit Location Patient: Home Provider: Virtual Visit Location Provider: Mobile   I discussed the limitations  of evaluation and management by telemedicine and the availability of in person appointments. The patient expressed understanding and agreed to proceed.    History of Present Illness: Donald [name] is a 61 y.o. who identifies as a male who was assigned male at birth, and is being seen [name] chills  .  HPI: Patient says he rode a scooter to work in the rain on Saturday and since then has been getting CHills and fever. He had to leave work on Saturday and has been out since. He took a covid test and was negative. He says he is still feeling weak and having chills.     Review of Systems  Constitutional:  Positive for chills and malaise/fatigue. Negative for fever.  HENT:  Positive for congestion (better).   Respiratory:  Positive for cough (better).   Musculoskeletal:  Positive for back pain.    Problems:  Patient Active Problem List   Diagnosis Date Noted   Chronic systolic congestive heart failure ([place]) 03/31/2021   Aphasia as late effect of cerebrovascular accident (CVA) 03/31/2021   Atypical atrial flutter ([place]) 08/29/2020   Secondary hypercoagulable state ([place]) 08/29/2020   CAP (community acquired pneumonia) 08/06/2020   OSA on CPAP 08/06/2020   Leukocytosis 08/06/2020   Acute pulmonary embolism ([place]) 08/06/2020   Atrial fibrillation with RVR ([place]) 03/08/2020   Pulmonary embolism ([place]) 03/08/2020   Essential hypertension 03/08/2020  COPD (chronic obstructive pulmonary disease) ([place]) 03/08/2020   Anemia 03/08/2020   COPD exacerbation ([place]) 11/16/2014   Atypical chest pain 11/16/2014   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   Paroxysmal atrial fibrillation ([place]) 11/16/2014   Acute bronchitis 11/16/2014   Abnormal EKG 11/16/2014   Type 2 diabetes mellitus without complication ([place]) 80/99/8338   Perirectal abscess 10/20/2012    Allergies: No Known Allergies Medications:  Current Outpatient Medications:    Accu-Chek Softclix Lancets lancets, Use as directed, Disp: 100  each, Rfl: 0   albuterol (PROVENTIL) (2.5 MG/3ML) 0.083% nebulizer solution, Take 3 mLs (2.5 mg total) by nebulization every 4 (four) hours as needed for wheezing or shortness of breath., Disp: 30 vial, Rfl: 0   albuterol (VENTOLIN HFA) 108 (90 Base) MCG/ACT inhaler, Inhale 2 puffs into the lungs every 4 (four) hours as needed for shortness of breath., Disp: 18 g, Rfl: 1   amiodarone (PACERONE) 200 MG tablet, TAKE 1 TABLET (200 MG TOTAL) BY MOUTH DAILY (AM), Disp: 90 tablet, Rfl: 3   atorvastatin (LIPITOR) 20 MG tablet, TAKE 1 TABLET (20 MG TOTAL) BY MOUTH DAILY (AM), Disp: 30 tablet, Rfl: 1   Blood Glucose Monitoring Suppl (BLOOD GLUCOSE MONITOR SYSTEM) w/Device KIT, USE AS DIRECTED, Disp: 1 kit, Rfl: 0   Blood Pressure Monitor DEVI, Please provide patient with insurance approved blood pressure monitor ICD 10  I10, Disp: 1 each, Rfl: 0   budesonide-formoterol (SYMBICORT) 80-4.5 MCG/ACT inhaler, Inhale 2 puffs into the lungs in the morning and at bedtime., Disp: 10.2 g, Rfl: 0   carvedilol (COREG) 3.125 MG tablet, TAKE 1 TABLET (3.125 MG TOTAL) BY MOUTH 2 (TWO) TIMES DAILY (AM+EVENING), Disp: 60 tablet, Rfl: 0   dapagliflozin propanediol (FARXIGA) 10 MG TABS tablet, Take 1 tablet (10 mg total) by mouth daily before breakfast., Disp: 30 tablet, Rfl: 11   digoxin (LANOXIN) 0.125 MG tablet, Take 0.5 tablets (0.0625 mg total) by mouth daily. NEEDS FOLLOW UP APPOINTMENT FOR ANYMORE REFILLS, Disp: 30 tablet, Rfl: 1   ELIQUIS 5 MG TABS tablet, TAKE 1 TABLET (5 MG TOTAL) BY MOUTH 2 (TWO) TIMES DAILY., Disp: 60 tablet, Rfl: 1   ENTRESTO 49-51 MG, TAKE 1 TABLET BY MOUTH 2 (TWO) TIMES DAILY. (AM+EVENING), Disp: 60 tablet, Rfl: 0   mometasone-formoterol (DULERA) 100-5 MCG/ACT AERO, Inhale 1 puff into the lungs daily., Disp: , Rfl:    OZEMPIC, 0.25 OR 0.5 MG/DOSE, 2 MG/3ML SOPN, INJECT 0.5 MG INTO THE SKIN ONCE A WEEK., Disp: 3 mL, Rfl: 1   spironolactone (ALDACTONE) 25 MG tablet, Take 1 tablet (25 mg total) by  mouth daily. NEEDS FOLLOW UP APPOINTMENT FOR ANYMORE REFILLS, Disp: 30 tablet, Rfl: 4   torsemide (DEMADEX) 20 MG tablet, Take 1 tablet (20 mg total) by mouth 2 (two) times daily. Please call for office visit [phone], Disp: 60 tablet, Rfl: 0   traZODone (DESYREL) 100 MG tablet, Take 0.5-1 tablets (50-100 mg total) by mouth at bedtime., Disp: 90 tablet, Rfl: 0   traZODone (DESYREL) 100 MG tablet, Take 0.5-1 tablets (50-100 mg total) by mouth at bedtime., Disp: 90 tablet, Rfl: 1  Observations/Objective: Patient is well-developed, well-nourished in no acute distress.  Resting comfortably  at home.  Head is normocephalic, atraumatic.  No labored breathing.  Speech is clear and coherent with logical content.  Patient is alert and oriented at baseline.  Raspy voice  Assessment and Plan:  MANCEL LARDIZABAL in [name] with chief complaint of No chief complaint on file.  1. Viral URI with cough 1. Take meds as prescribed 2. Use a cool mist humidifier especially during the winter months and when heat has been humid. 3. Use saline nose sprays frequently 4. Saline irrigations of the nose can be very helpful if [name] frequently.  * 4X daily for 1 week*  * Use of a nettie pot can be helpful with this. Follow directions with this* 5. Drink plenty of fluids 6. Keep thermostat turn down low 7.For any cough or congestion- OTC meds as needed 8. For fever or aces or pains- take tylenol or ibuprofen appropriate for age and weight.  * for fevers greater than 101 orally you may alternate ibuprofen and tylenol every  3 hours.     Follow Up Instructions: I discussed the assessment and treatment plan with the patient. The patient was provided an opportunity to ask questions and all were answered. The patient agreed with the plan and demonstrated an understanding of the instructions.  A copy of instructions were sent to the patient via MyChart.  The patient was advised to call back or seek an in-person  evaluation if the symptoms worsen or if the condition fails to improve as anticipated.  Time:  I spent 6 minutes with the patient via telehealth technology discussing the above problems/concerns.    Donald Hassell Done, FNP

## 2021-11-12 NOTE — Patient Instructions (Signed)
1. Take meds as prescribed 2. Use a cool mist humidifier especially during the winter months and when heat has been humid. 3. Use saline nose sprays frequently 4. Saline irrigations of the nose can be very helpful if done frequently.  * 4X daily for 1 week*  * Use of a nettie pot can be helpful with this. Follow directions with this* 5. Drink plenty of fluids 6. Keep thermostat turn down low 7.For any cough or congestion- OTC as needed 8. For fever or aces or pains- take tylenol or ibuprofen appropriate for age and weight.  * for fevers greater than 101 orally you may alternate ibuprofen and tylenol every  3 hours.     

## 2021-11-12 NOTE — Telephone Encounter (Signed)
  Chief Complaint: aches and pain Symptoms: was in cold rain 3 days ago has felt bad since Frequency: since Saturday Pertinent Negatives: Patient denies fever today Disposition: [] ED /[] Urgent Care (no appt availability in office) / [] Appointment(In office/virtual)/ [x]  [place] [facility]/ [] Home Care/ [] Refused Recommended Disposition /[] [place] [facility]/ []  Follow-up with PCP Additional Notes: [place] [facility] visit scheduled. Home care discussed.  Reason for Disposition  [1] MODERATE pain (e.g., interferes with normal activities) AND [2] present > 3 days  Answer Assessment - Initial Assessment Questions 1. ONSET: "When did the muscle aches or body pains start?"      Saturday 2. LOCATION: "What part of your body is hurting?" (e.g., entire body, arms, legs)      All body 3. SEVERITY: "How bad is the pain?" (Scale 1-10; or mild, moderate, severe)   - MILD (1-3): doesn't interfere with normal activities    - MODERATE (4-7): interferes with normal activities or awakens from sleep    - SEVERE (8-10):  excruciating pain, unable to do any normal activities      Today 3 was 10  4. CAUSE: "What do you think is causing the pains?"     From being in the rain 5. FEVER: "Have you been having fever?"     Been very cold, but not a fever 6. OTHER SYMPTOMS: "Do you have any other symptoms?" (e.g., chest pain, weakness, rash, cold or flu symptoms, weight loss)     Cold and flu symptoms, fatigue 7. PREGNANCY: "Is there any chance you are pregnant?" "When was your last menstrual period?"     na 8. TRAVEL: "Have you traveled out of the country in the last month?" (e.g., travel history, exposures)     na  Protocols used: Muscle Aches and Body Pain-A-AH

## 2021-11-17 ENCOUNTER — Ambulatory Visit (INDEPENDENT_AMBULATORY_CARE_PROVIDER_SITE_OTHER): Payer: Medicaid Other

## 2021-11-17 ENCOUNTER — Ambulatory Visit (HOSPITAL_COMMUNITY)
Admission: RE | Admit: 2021-11-17 | Discharge: 2021-11-17 | Disposition: A | Discharge status: Home / Self Care | Payer: Medicaid Other | Source: Ambulatory Visit | Attending: Family Medicine | Admitting: Family Medicine

## 2021-11-17 ENCOUNTER — Encounter (HOSPITAL_COMMUNITY): Payer: Self-pay

## 2021-11-17 VITALS — BP 112/74 | HR 76 | Temp 97.9°F | Resp 16

## 2021-11-17 DIAGNOSIS — R5383 Other fatigue: Secondary | ICD-10-CM | POA: Diagnosis not present

## 2021-11-17 DIAGNOSIS — R059 Cough, unspecified: Secondary | ICD-10-CM | POA: Diagnosis not present

## 2021-11-17 NOTE — Discharge Instructions (Addendum)
You were seen today for increased fatigue from your baseline.  Your chest xray was negative for pneumonia or abnormality.  I do not see anything emergent at this time.  I recommend you get plenty of rest and fluids.  I also recommend you follow up with your primary care provider if you continue to feel poorly for further discussion and work up.

## 2021-11-17 NOTE — ED Provider Notes (Signed)
[facility]    CSN: [id number] Arrival date & time: 11/17/21  0947      History   Chief Complaint Chief Complaint  Patient presents with   URI   Fatigue    HPI Donald [name] is a 61 y.o. male.   Patient is here for not feeling well.  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  Went home early and he slept for 2 days after that.  He continues to not feel himself.   He continues to feel very fatigued.  He is having slight cough.  He is particularly worried about pneumonia as he has had this in the past.        Past Medical History:  Diagnosis Date   Arrhythmia    A fib    Atrial fibrillation ([place])    CHF (congestive heart failure) ([place])    COPD (chronic obstructive pulmonary disease) ([place])    Diabetes mellitus without complication ([place])    Hypertension    Sleep apnea    Stroke [facility])     Patient Active Problem List   Diagnosis Date Noted   Chronic systolic congestive heart failure ([place]) 03/31/2021   Aphasia as late effect of cerebrovascular accident (CVA) 03/31/2021   Atypical atrial flutter ([place]) 08/29/2020   Secondary hypercoagulable state ([place]) 08/29/2020   CAP (community acquired pneumonia) 08/06/2020   OSA on CPAP 08/06/2020   Leukocytosis 08/06/2020   Acute pulmonary embolism ([place]) 08/06/2020   Atrial fibrillation with RVR ([place]) 03/08/2020   Pulmonary embolism ([place]) 03/08/2020   Essential hypertension 03/08/2020   COPD (chronic obstructive pulmonary disease) ([place]) 03/08/2020   Anemia 03/08/2020   COPD exacerbation ([place]) 11/16/2014   Atypical chest pain 11/16/2014   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   Paroxysmal atrial fibrillation ([place]) 11/16/2014   Acute bronchitis 11/16/2014   Abnormal EKG 11/16/2014   Type 2 diabetes mellitus without complication ([place]) 54/65/0354   Perirectal abscess 10/20/2012    Past Surgical History:  Procedure Laterality Date   ABLATION     CARDIOVERSION N/A 03/12/2020    Procedure: CARDIOVERSION;  Surgeon: Skeet Latch, MD;  Location: [facility];  Service: Cardiovascular;  Laterality: N/A;   CARDIOVERSION N/A 08/13/2020   Procedure: CARDIOVERSION;  Surgeon: Jolaine Artist, MD;  Location: [facility] ENDOSCOPY;  Service: Cardiovascular;  Laterality: N/A;   CARDIOVERSION N/A 09/02/2020   Procedure: CARDIOVERSION;  Surgeon: Elouise Munroe, MD;  Location: [facility] ENDOSCOPY;  Service: Cardiovascular;  Laterality: N/A;   INCISION AND DRAINAGE PERIRECTAL ABSCESS N/A 10/20/2012   Procedure: IRRIGATION AND DEBRIDEMENT PERIRECTAL ABSCESS;  Surgeon: Imogene Burn. Georgette Dover, MD;  Location: [place];  Service: General;  Laterality: N/A;  Lithotomy   RIGHT/LEFT HEART CATH AND CORONARY ANGIOGRAPHY N/A 08/12/2020   Procedure: RIGHT/LEFT HEART CATH AND CORONARY ANGIOGRAPHY;  Surgeon: Jolaine Artist, MD;  Location: [facility] CV LAB;  Service: Cardiovascular;  Laterality: N/A;   TEE WITHOUT CARDIOVERSION N/A 03/12/2020   Procedure: TRANSESOPHAGEAL ECHOCARDIOGRAM (TEE);  Surgeon: Skeet Latch, MD;  Location: [place];  Service: Cardiovascular;  Laterality: N/A;   TEE WITHOUT CARDIOVERSION N/A 08/13/2020   Procedure: TRANSESOPHAGEAL ECHOCARDIOGRAM (TEE);  Surgeon: Jolaine Artist, MD;  Location: [facility] ENDOSCOPY;  Service: Cardiovascular;  Laterality: N/A;       Home Medications    Prior to Admission medications   Medication Sig Start Date End Date Taking? Authorizing Provider  Accu-Chek Softclix Lancets lancets Use as directed 08/15/20  Yes [place],  Adline Peals, MD  albuterol (PROVENTIL) (2.5 MG/3ML) 0.083% nebulizer solution Take 3 mLs (2.5 mg total) by nebulization every 4 (four) hours as needed for wheezing or shortness of breath. 10/07/15  Yes Malvin Johns, MD  albuterol (VENTOLIN HFA) 108 (90 Base) MCG/ACT inhaler Inhale 2 puffs into the lungs every 4 (four) hours as needed for shortness of breath. 07/14/21 01/30/29 Yes Gildardo Pounds, NP  amiodarone (PACERONE) 200 MG  tablet TAKE 1 TABLET (200 MG TOTAL) BY MOUTH DAILY (AM) 09/03/21  Yes Bensimhon, Shaune Pascal, MD  atorvastatin (LIPITOR) 20 MG tablet TAKE 1 TABLET (20 MG TOTAL) BY MOUTH DAILY (AM) 09/03/21  Yes Bensimhon, Shaune Pascal, MD  Blood Glucose Monitoring Suppl (BLOOD GLUCOSE MONITOR SYSTEM) w/Device KIT USE AS DIRECTED 08/15/20  Yes Alma Friendly, MD  Blood Pressure Monitor DEVI Please provide patient with insurance approved blood pressure monitor ICD 10  I10 07/14/21  Yes Gildardo Pounds, NP  carvedilol (COREG) 3.125 MG tablet TAKE 1 TABLET (3.125 MG TOTAL) BY MOUTH 2 (TWO) TIMES DAILY (AM+EVENING) 10/28/21  Yes [place], Maricela Bo, FNP  dapagliflozin propanediol (FARXIGA) 10 MG TABS tablet Take 1 tablet (10 mg total) by mouth daily before breakfast. 12/12/20  Yes Bensimhon, Shaune Pascal, MD  digoxin (LANOXIN) 0.125 MG tablet Take 0.5 tablets (0.0625 mg total) by mouth daily. NEEDS FOLLOW UP APPOINTMENT FOR ANYMORE REFILLS 05/23/21  Yes [place], Jessica M, FNP  ELIQUIS 5 MG TABS tablet TAKE 1 TABLET (5 MG TOTAL) BY MOUTH 2 (TWO) TIMES DAILY. 09/03/21  Yes Bensimhon, Shaune Pascal, MD  ENTRESTO 49-51 MG TAKE 1 TABLET BY MOUTH 2 (TWO) TIMES DAILY. (AM+EVENING) 10/28/21  Yes [place], [place], FNP  mometasone-formoterol (DULERA) 100-5 MCG/ACT AERO Inhale 1 puff into the lungs daily.   Yes [provider]  OZEMPIC, 0.25 OR 0.5 MG/DOSE, 2 MG/3ML SOPN INJECT 0.5 MG INTO THE SKIN ONCE A WEEK. 09/02/21  Yes Gildardo Pounds, NP  spironolactone (ALDACTONE) 25 MG tablet Take 1 tablet (25 mg total) by mouth daily. NEEDS FOLLOW UP APPOINTMENT FOR ANYMORE REFILLS 05/23/21  Yes [place], Amy D, NP  torsemide (DEMADEX) 20 MG tablet Take 1 tablet (20 mg total) by mouth 2 (two) times daily. Please call for office visit [phone] 09/03/21  Yes Bensimhon, Shaune Pascal, MD  traZODone (DESYREL) 100 MG tablet Take 0.5-1 tablets (50-100 mg total) by mouth at bedtime. 07/14/21  Yes Gildardo Pounds, NP  traZODone (DESYREL) 100 MG tablet Take 0.5-1  tablets (50-100 mg total) by mouth at bedtime. 07/14/21  Yes Gildardo Pounds, NP  budesonide-formoterol (SYMBICORT) 80-4.5 MCG/ACT inhaler Inhale 2 puffs into the lungs in the morning and at bedtime. 08/15/20 09/14/20  Alma Friendly, MD    Family History Family History  Problem Relation Age of Onset   Breast cancer Mother    Heart attack Father 82   Heart attack Paternal Uncle 43    Social History Social History   Tobacco Use   Smoking status: Former    Packs/day: 0.50    Types: Cigarettes    Quit date: 2018    Years since quitting: 5.8   Smokeless tobacco: Never  Substance Use Topics   Alcohol use: Not Currently   Drug use: Not Currently    Types: Cocaine     Allergies   Patient has no known allergies.   Review of Systems Review of Systems  Constitutional:  Positive for fatigue. Negative for chills and fever.  HENT:  Positive for congestion.  Respiratory:  Positive for cough.   Cardiovascular: Negative.   Gastrointestinal: Negative.   Genitourinary: Negative.   Musculoskeletal: Negative.   Hematological: Negative.   Psychiatric/Behavioral: Negative.       Physical Exam Triage Vital Signs ED Triage Vitals  Enc Vitals Group     BP 11/17/21 1004 112/74     Pulse Rate 11/17/21 1004 76     Resp 11/17/21 1004 16     Temp 11/17/21 1004 97.9 F (36.6 C)     Temp Source 11/17/21 1004 Oral     SpO2 11/17/21 1004 94 %     Weight --      Height --      Head Circumference --      Peak Flow --      Pain Score 11/17/21 1003 0     Pain Loc --      Pain Edu? --      Excl. in [place]? --    No data found.  Updated Vital Signs BP 112/74 (BP Location: Right Arm)   Pulse 76   Temp 97.9 F (36.6 C) (Oral)   Resp 16   SpO2 94%   Visual Acuity Right Eye Distance:   Left Eye Distance:   Bilateral Distance:    Right Eye Near:   Left Eye Near:    Bilateral Near:     Physical Exam Constitutional:      Appearance: Normal appearance.  HENT:     Nose: Nose  normal.     Mouth/Throat:     Mouth: Mucous membranes are moist.  Cardiovascular:     Rate and Rhythm: Normal rate and regular rhythm.  Pulmonary:     Effort: Pulmonary effort is normal.     Breath sounds: Normal breath sounds.  Musculoskeletal:        General: Normal range of motion.     Cervical back: Normal range of motion and neck supple.  Skin:    General: Skin is warm.  Neurological:     General: No focal deficit present.     Mental Status: He is alert.  Psychiatric:        Mood and Affect: Mood normal.      UC Treatments / Results  Labs (all labs ordered are listed, but only abnormal results are displayed) Labs Reviewed - No data to display  EKG   Radiology DG Chest 2 View  Result Date: 11/17/2021 CLINICAL DATA:  Fatigue and cold like symptoms.  Cough. EXAM: CHEST - 2 VIEW COMPARISON:  Chest radiograph August 06, 2020 FINDINGS: Stable cardiac and mediastinal contours. No large area pulmonary consolidation. No pleural effusion or pneumothorax. Thoracic spine degenerative changes. IMPRESSION: No active cardiopulmonary disease. Electronically Signed   By: Lovey Newcomer M.D.   On: 11/17/2021 11:02    Procedures Procedures (including critical care time)  Medications Ordered in UC Medications - No data to display  Initial Impression / Assessment and Plan / UC Course  I have reviewed the triage vital signs and the nursing notes.  Pertinent labs & imaging results that were available during my care of the patient were reviewed by me and considered in my medical decision making (see chart for details). Final Clinical Impressions(s) / UC Diagnoses   Final diagnoses:  Other fatigue     Discharge Instructions      You were seen today for increased fatigue from your baseline.  Your chest xray was negative for pneumonia or abnormality.  I do not see anything  emergent at this time.  I recommend you get plenty of rest and fluids.  I also recommend you follow up with your  primary care provider if you continue to feel poorly for further discussion and work up.     ED Prescriptions   None    PDMP not reviewed this encounter.   Rondel Oh, MD 11/17/21 [phone]

## 2021-11-17 NOTE — ED Triage Notes (Signed)
Patient having symptoms onset last week. States this was after being outside in the rain on his scooter, was soaked through and cold.  Patient having fatigue and cold symptoms.  Patient states he is prone to pneumonia and this was how he felt at the beginning.

## 2021-12-25 ENCOUNTER — Emergency Department (HOSPITAL_COMMUNITY): Payer: Medicaid Other

## 2021-12-25 ENCOUNTER — Observation Stay (HOSPITAL_COMMUNITY)
Admission: EM | Admit: 2021-12-25 | Discharge: 2021-12-26 | Disposition: A | Discharge status: Home / Self Care | Payer: Medicaid Other | Attending: Family Medicine | Admitting: Family Medicine

## 2021-12-25 ENCOUNTER — Other Ambulatory Visit (HOSPITAL_COMMUNITY): Payer: Self-pay | Admitting: Internal Medicine

## 2021-12-25 ENCOUNTER — Other Ambulatory Visit: Payer: Self-pay

## 2021-12-25 ENCOUNTER — Other Ambulatory Visit (HOSPITAL_COMMUNITY): Payer: Self-pay | Admitting: Family Medicine

## 2021-12-25 ENCOUNTER — Encounter (HOSPITAL_COMMUNITY): Payer: Self-pay

## 2021-12-25 DIAGNOSIS — Z86711 Personal history of pulmonary embolism: Secondary | ICD-10-CM | POA: Diagnosis not present

## 2021-12-25 DIAGNOSIS — R4781 Slurred speech: Secondary | ICD-10-CM | POA: Diagnosis present

## 2021-12-25 DIAGNOSIS — I959 Hypotension, unspecified: Secondary | ICD-10-CM | POA: Diagnosis not present

## 2021-12-25 DIAGNOSIS — Z79899 Other long term (current) drug therapy: Secondary | ICD-10-CM | POA: Insufficient documentation

## 2021-12-25 DIAGNOSIS — I480 Paroxysmal atrial fibrillation: Secondary | ICD-10-CM

## 2021-12-25 DIAGNOSIS — Z7985 Long-term (current) use of injectable non-insulin antidiabetic drugs: Secondary | ICD-10-CM | POA: Diagnosis not present

## 2021-12-25 DIAGNOSIS — R55 Syncope and collapse: Secondary | ICD-10-CM

## 2021-12-25 DIAGNOSIS — I4891 Unspecified atrial fibrillation: Secondary | ICD-10-CM | POA: Diagnosis not present

## 2021-12-25 DIAGNOSIS — E861 Hypovolemia: Secondary | ICD-10-CM | POA: Diagnosis not present

## 2021-12-25 DIAGNOSIS — Z87891 Personal history of nicotine dependence: Secondary | ICD-10-CM | POA: Insufficient documentation

## 2021-12-25 DIAGNOSIS — Z8673 Personal history of transient ischemic attack (TIA), and cerebral infarction without residual deficits: Secondary | ICD-10-CM | POA: Insufficient documentation

## 2021-12-25 DIAGNOSIS — E119 Type 2 diabetes mellitus without complications: Secondary | ICD-10-CM | POA: Insufficient documentation

## 2021-12-25 DIAGNOSIS — N179 Acute kidney failure, unspecified: Secondary | ICD-10-CM

## 2021-12-25 DIAGNOSIS — Z7901 Long term (current) use of anticoagulants: Secondary | ICD-10-CM | POA: Diagnosis not present

## 2021-12-25 DIAGNOSIS — I110 Hypertensive heart disease with heart failure: Secondary | ICD-10-CM | POA: Diagnosis not present

## 2021-12-25 DIAGNOSIS — I69320 Aphasia following cerebral infarction: Secondary | ICD-10-CM

## 2021-12-25 DIAGNOSIS — R9431 Abnormal electrocardiogram [ECG] [EKG]: Secondary | ICD-10-CM | POA: Insufficient documentation

## 2021-12-25 DIAGNOSIS — J449 Chronic obstructive pulmonary disease, unspecified: Secondary | ICD-10-CM | POA: Diagnosis not present

## 2021-12-25 DIAGNOSIS — I5022 Chronic systolic (congestive) heart failure: Secondary | ICD-10-CM | POA: Diagnosis present

## 2021-12-25 DIAGNOSIS — I9589 Other hypotension: Secondary | ICD-10-CM

## 2021-12-25 DIAGNOSIS — G4733 Obstructive sleep apnea (adult) (pediatric): Secondary | ICD-10-CM

## 2021-12-25 DIAGNOSIS — D6869 Other thrombophilia: Secondary | ICD-10-CM | POA: Diagnosis present

## 2021-12-25 DIAGNOSIS — Z7984 Long term (current) use of oral hypoglycemic drugs: Secondary | ICD-10-CM | POA: Insufficient documentation

## 2021-12-25 DIAGNOSIS — I10 Essential (primary) hypertension: Secondary | ICD-10-CM | POA: Diagnosis present

## 2021-12-25 DIAGNOSIS — I952 Hypotension due to drugs: Secondary | ICD-10-CM | POA: Diagnosis not present

## 2021-12-25 DIAGNOSIS — I2699 Other pulmonary embolism without acute cor pulmonale: Secondary | ICD-10-CM | POA: Diagnosis present

## 2021-12-25 DIAGNOSIS — D751 Secondary polycythemia: Secondary | ICD-10-CM | POA: Insufficient documentation

## 2021-12-25 DIAGNOSIS — I5043 Acute on chronic combined systolic (congestive) and diastolic (congestive) heart failure: Secondary | ICD-10-CM

## 2021-12-25 LAB — URINALYSIS, ROUTINE W REFLEX MICROSCOPIC
Bacteria, UA: NONE SEEN
Bilirubin Urine: NEGATIVE
Glucose, UA: >=500 mg/dL — AB
Hgb urine dipstick: NEGATIVE
Ketones, ur: NEGATIVE mg/dL
Leukocytes,Ua: NEGATIVE
Nitrite: NEGATIVE
Protein, ur: NEGATIVE mg/dL
Specific Gravity, Urine: 1.035 — ABNORMAL HIGH (ref 1.005–1.030)
pH: 5 (ref 5.0–8.0)

## 2021-12-25 LAB — TROPONIN I (HIGH SENSITIVITY)
Troponin I (High Sensitivity): 15 ng/L (ref <18)
Troponin I (High Sensitivity): 13 ng/L (ref <18)

## 2021-12-25 LAB — DIFFERENTIAL
Abs Immature Granulocytes: 0.01 10*3/uL (ref 0.00–0.07)
Basophils Absolute: 0.1 10*3/uL (ref 0.0–0.1)
Basophils Relative: 1 %
Eosinophils Absolute: 0.3 10*3/uL (ref 0.0–0.5)
Eosinophils Relative: 4 %
Immature Granulocytes: 0 %
Lymphocytes Relative: 18 %
Lymphs Abs: 1.3 10*3/uL (ref 0.7–4.0)
Monocytes Absolute: 0.5 10*3/uL (ref 0.1–1.0)
Monocytes Relative: 7 %
Neutro Abs: 5.4 10*3/uL (ref 1.7–7.7)
Neutrophils Relative %: 70 %

## 2021-12-25 LAB — I-STAT CHEM 8, ED
BUN: 15 mg/dL (ref 6–20)
Calcium, Ion: 1.07 mmol/L — ABNORMAL LOW (ref 1.15–1.40)
Chloride: 99 mmol/L (ref 98–111)
Creatinine, Ser: 1.5 mg/dL — ABNORMAL HIGH (ref 0.61–1.24)
Glucose, Bld: 186 mg/dL — ABNORMAL HIGH (ref 70–99)
HCT: 57 % — ABNORMAL HIGH (ref 39.0–52.0)
Hemoglobin: 19.4 g/dL — ABNORMAL HIGH (ref 13.0–17.0)
Potassium: 3.8 mmol/L (ref 3.5–5.1)
Sodium: 140 mmol/L (ref 135–145)
TCO2: 26 mmol/L (ref 22–32)

## 2021-12-25 LAB — PROTIME-INR
INR: 1.2 (ref 0.8–1.2)
Prothrombin Time: 14.7 seconds (ref 11.4–15.2)

## 2021-12-25 LAB — COMPREHENSIVE METABOLIC PANEL
ALT: 16 U/L (ref 0–44)
AST: 21 U/L (ref 15–41)
Albumin: 4.1 g/dL (ref 3.5–5.0)
Alkaline Phosphatase: 77 U/L (ref 38–126)
Anion gap: 12 (ref 5–15)
BUN: 13 mg/dL (ref 6–20)
CO2: 28 mmol/L (ref 22–32)
Calcium: 9.3 mg/dL (ref 8.9–10.3)
Chloride: 99 mmol/L (ref 98–111)
Creatinine, Ser: 1.52 mg/dL — ABNORMAL HIGH (ref 0.61–1.24)
GFR, Estimated: 52 mL/min — ABNORMAL LOW (ref >60)
Glucose, Bld: 181 mg/dL — ABNORMAL HIGH (ref 70–99)
Potassium: 3.9 mmol/L (ref 3.5–5.1)
Sodium: 139 mmol/L (ref 135–145)
Total Bilirubin: 0.9 mg/dL (ref 0.3–1.2)
Total Protein: 7.6 g/dL (ref 6.5–8.1)

## 2021-12-25 LAB — CBC
HCT: 54.1 % — ABNORMAL HIGH (ref 39.0–52.0)
Hemoglobin: 17.8 g/dL — ABNORMAL HIGH (ref 13.0–17.0)
MCH: 28.2 pg (ref 26.0–34.0)
MCHC: 32.9 g/dL (ref 30.0–36.0)
MCV: 85.7 fL (ref 80.0–100.0)
Platelets: 252 10*3/uL (ref 150–400)
RBC: 6.31 MIL/uL — ABNORMAL HIGH (ref 4.22–5.81)
RDW: 14.7 % (ref 11.5–15.5)
WBC: 7.6 10*3/uL (ref 4.0–10.5)
nRBC: 0 % (ref 0.0–0.2)

## 2021-12-25 LAB — RAPID URINE DRUG SCREEN, HOSP PERFORMED
Amphetamines: NOT DETECTED
Barbiturates: NOT DETECTED
Benzodiazepines: NOT DETECTED
Cocaine: NOT DETECTED
Opiates: NOT DETECTED
Tetrahydrocannabinol: NOT DETECTED

## 2021-12-25 LAB — LACTIC ACID, PLASMA
Lactic Acid, Venous: 1.8 mmol/L (ref 0.5–1.9)
Lactic Acid, Venous: 1.7 mmol/L (ref 0.5–1.9)

## 2021-12-25 LAB — APTT: aPTT: 38 seconds — ABNORMAL HIGH (ref 24–36)

## 2021-12-25 LAB — DIGOXIN LEVEL: Digoxin Level: <0.2 ng/mL — ABNORMAL LOW (ref 0.8–2.0)

## 2021-12-25 LAB — MAGNESIUM: Magnesium: 1.9 mg/dL (ref 1.7–2.4)

## 2021-12-25 LAB — ETHANOL: Alcohol, Ethyl (B): <10 mg/dL (ref <10)

## 2021-12-25 LAB — BRAIN NATRIURETIC PEPTIDE: B Natriuretic Peptide: 588.7 pg/mL — ABNORMAL HIGH (ref 0.0–100.0)

## 2021-12-25 LAB — CBG MONITORING, ED: Glucose-Capillary: 199 mg/dL — ABNORMAL HIGH (ref 70–99)

## 2021-12-25 MED ORDER — SODIUM CHLORIDE 0.9 % IV BOLUS: 1000.0000 mL | Freq: Once | INTRAVENOUS | Status: DC

## 2021-12-25 MED ORDER — ATORVASTATIN CALCIUM 10 MG PO TABS
20.0000 mg | ORAL_TABLET | Freq: Every day | ORAL | Status: DC
  Administered 2021-12-26: 20 mg via ORAL
  Filled 2021-12-25 (×2): qty 2

## 2021-12-25 MED ORDER — CARVEDILOL 3.125 MG PO TABS
3.1250 mg | ORAL_TABLET | Freq: Two times a day (BID) | ORAL | Status: DC
  Administered 2021-12-25 – 2021-12-26 (×2): 3.125 mg via ORAL
  Filled 2021-12-25 (×3): qty 1

## 2021-12-25 MED ORDER — SODIUM CHLORIDE 0.9 % IV SOLN: Freq: Once | INTRAVENOUS | Status: AC

## 2021-12-25 MED ORDER — SODIUM CHLORIDE 0.9% FLUSH: 3.0000 mL | INTRAVENOUS | Status: DC | PRN

## 2021-12-25 MED ORDER — APIXABAN 5 MG PO TABS
5.0000 mg | ORAL_TABLET | Freq: Two times a day (BID) | ORAL | Status: DC
  Administered 2021-12-26: 5 mg via ORAL
  Filled 2021-12-25 (×2): qty 1

## 2021-12-25 MED ORDER — IOHEXOL 350 MG/ML SOLN
65.0000 mL | Freq: Once | INTRAVENOUS | Status: AC | PRN
  Administered 2021-12-25: 65 mL via INTRAVENOUS

## 2021-12-25 MED ORDER — AMIODARONE HCL IN DEXTROSE 360-4.14 MG/200ML-% IV SOLN: 60.0000 mg/h | INTRAVENOUS | Status: DC

## 2021-12-25 MED ORDER — AMIODARONE LOAD VIA INFUSION: 150.0000 mg | Freq: Once | INTRAVENOUS | Status: DC

## 2021-12-25 MED ORDER — SODIUM CHLORIDE 0.9 % IV BOLUS
1500.0000 mL | Freq: Once | INTRAVENOUS | Status: AC
  Administered 2021-12-25: 1500 mL via INTRAVENOUS

## 2021-12-25 MED ORDER — INSULIN ASPART 100 UNIT/ML IJ SOLN: 0.0000 [IU] | Freq: Three times a day (TID) | INTRAMUSCULAR | Status: DC

## 2021-12-25 MED ORDER — SODIUM CHLORIDE 0.9 % IV SOLN: 250.0000 mL | INTRAVENOUS | Status: DC | PRN

## 2021-12-25 MED ORDER — ACETAMINOPHEN 325 MG PO TABS: 650.0000 mg | ORAL_TABLET | Freq: Four times a day (QID) | ORAL | Status: DC | PRN

## 2021-12-25 MED ORDER — AMIODARONE HCL IN DEXTROSE 360-4.14 MG/200ML-% IV SOLN: 30.0000 mg/h | INTRAVENOUS | Status: DC

## 2021-12-25 MED ORDER — AMIODARONE HCL 200 MG PO TABS
200.0000 mg | ORAL_TABLET | Freq: Every day | ORAL | Status: DC
  Administered 2021-12-26: 200 mg via ORAL
  Filled 2021-12-25 (×2): qty 1

## 2021-12-25 MED ORDER — ACETAMINOPHEN 650 MG RE SUPP: 650.0000 mg | Freq: Four times a day (QID) | RECTAL | Status: DC | PRN

## 2021-12-25 MED ORDER — SODIUM CHLORIDE 0.9 % IV BOLUS
500.0000 mL | Freq: Once | INTRAVENOUS | Status: AC
  Administered 2021-12-25: 500 mL via INTRAVENOUS

## 2021-12-25 MED ORDER — APIXABAN 5 MG PO TABS
5.0000 mg | ORAL_TABLET | Freq: Two times a day (BID) | ORAL | Status: DC
  Administered 2021-12-25: 5 mg via ORAL
  Filled 2021-12-25: qty 1

## 2021-12-25 MED ORDER — DIGOXIN 125 MCG PO TABS
0.0625 mg | ORAL_TABLET | Freq: Every day | ORAL | Status: DC
  Administered 2021-12-26: 0.0625 mg via ORAL
  Filled 2021-12-25 (×2): qty 1

## 2021-12-25 MED ORDER — SODIUM CHLORIDE 0.9 % IV SOLN: INTRAVENOUS | Status: DC

## 2021-12-25 MED ORDER — SODIUM CHLORIDE 0.9% FLUSH
3.0000 mL | Freq: Two times a day (BID) | INTRAVENOUS | Status: DC
  Administered 2021-12-26: 3 mL via INTRAVENOUS

## 2021-12-25 MED ORDER — SODIUM CHLORIDE 0.9 % IV BOLUS: 500.0000 mL | Freq: Once | INTRAVENOUS | Status: DC

## 2021-12-25 MED ORDER — FLUTICASONE FUROATE-VILANTEROL 100-25 MCG/ACT IN AEPB
1.0000 | INHALATION_SPRAY | Freq: Every day | RESPIRATORY_TRACT | Status: DC
  Administered 2021-12-26: 1 via RESPIRATORY_TRACT
  Filled 2021-12-25 (×2): qty 28

## 2021-12-25 NOTE — Code Documentation (Addendum)
Donald [name] is a 61 yr old male presenting to [facility] on 12/25/2021 with a past medical history of AF, CHF and previous stroke. He is on Eliquis.  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 Now he complains of dizziness and dysarthria. Code stroke alert activated in triage. Pt met by stroke team in [place] scanner. NIHSS 1 for dysarthria. Pt is diaphoretic.    CT non contrast performed, and was negative for acute hemorrhage per Dr. Erlinda Hong. BP 92/79, pulse rapid and irreg. Pt returned to room 21 where his workup will continue. HR 130-140s, and irreg (AF). 500 cc fluid bolus in process. Pt thinks he may have either taken his medications incorrectly, or been given the incorrect dose at his pharmacy.     Pt is not a candidate for thrombolytics as he is on Eliquis. He is not eligible for NIR as his exam is LVO negative. Bedside handoff with ED RN complete. VS q 2 hr (or more frequently as ordered by EDP),  NIHSS q 2 hr.

## 2021-12-25 NOTE — ED Triage Notes (Signed)
Pt came to writer's office door wanting to get labs drawn.  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  Pt's speech is slurred.  No weakness/numbness.  No arm drift.  States he has a history of stroke. Pt reports sudden onset of slurred speech and dizziness 1 hour ago (1420 LKW).  Pt immediately registered and Code Stroke activated.  Seen by Adelina Mings, PA.  Pt to CT with Chestine Spore, RN.

## 2021-12-25 NOTE — ED Notes (Signed)
Pt given food per MD order. Tolerating PO with no difficulty

## 2021-12-25 NOTE — Consult Note (Signed)
Stroke Neurology Consultation Note  Consult Requested by: Dr. Ronnald Nian  Reason for Consult: code stroke  Consult Date: 12/25/21   The history was obtained from the pt.  During history and examination, all items were able to obtain unless otherwise noted.  History of Present Illness:  Donald [name] is a 61 y.o. African American male with PMH of PAF on eliquis, CHF on GDMT, PE, HTN, OSA, stroke presented to ED for dizziness, wooziness, sweating while driving to work. He stated that he went to pharmacy in [facility] to pick up meds today, he received his meds in a pill pack form. He took the pack of medication around 11:30 am. He was driving to work around 1:45pm, he felt brain wooziness, lightheadedness, and sweating. He came to ER, in triage, he was found to have dysarthria and code stroke activated. 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 His BP was low in ED and also in afib RVR. Receiving NS bolus now.   He can not tell what exactly meds in the pill pack he took today but per the chart, he has home meds of amiodarone, lipitor, coreg, digoxin, eliquis, entresto, spironolactone, torsemide. Pt stated that he felt the pack of medication he took had more pills than others.   LSN: 1:45PM tPA Given: No: on eliquis, no focal deficit, likely not stroke  Past Medical History:  Diagnosis Date   Arrhythmia    A fib    Atrial fibrillation (HCC)    CHF (congestive heart failure) (HCC)    COPD (chronic obstructive pulmonary disease) ([place])    Diabetes mellitus without complication ([place])    Hypertension    Sleep apnea    Stroke [facility])     Past Surgical History:  Procedure Laterality Date   ABLATION     CARDIOVERSION N/A 03/12/2020   Procedure: CARDIOVERSION;  Surgeon: Skeet Latch, MD;  Location: [place];  Service: Cardiovascular;  Laterality: N/A;   CARDIOVERSION N/A 08/13/2020   Procedure: CARDIOVERSION;  Surgeon: Jolaine Artist, MD;  Location: [place];  Service:  Cardiovascular;  Laterality: N/A;   CARDIOVERSION N/A 09/02/2020   Procedure: CARDIOVERSION;  Surgeon: Elouise Munroe, MD;  Location: [place];  Service: Cardiovascular;  Laterality: N/A;   INCISION AND DRAINAGE PERIRECTAL ABSCESS N/A 10/20/2012   Procedure: IRRIGATION AND DEBRIDEMENT PERIRECTAL ABSCESS;  Surgeon: Imogene Burn. Georgette Dover, MD;  Location: [place];  Service: General;  Laterality: N/A;  Lithotomy   RIGHT/LEFT HEART CATH AND CORONARY ANGIOGRAPHY N/A 08/12/2020   Procedure: RIGHT/LEFT HEART CATH AND CORONARY ANGIOGRAPHY;  Surgeon: Jolaine Artist, MD;  Location: [place] [facility];  Service: Cardiovascular;  Laterality: N/A;   TEE WITHOUT CARDIOVERSION N/A 03/12/2020   Procedure: TRANSESOPHAGEAL ECHOCARDIOGRAM (TEE);  Surgeon: Skeet Latch, MD;  Location: [place];  Service: Cardiovascular;  Laterality: N/A;   TEE WITHOUT CARDIOVERSION N/A 08/13/2020   Procedure: TRANSESOPHAGEAL ECHOCARDIOGRAM (TEE);  Surgeon: Jolaine Artist, MD;  Location: [facility] ENDOSCOPY;  Service: Cardiovascular;  Laterality: N/A;    Family History  Problem Relation Age of Onset   Breast cancer Mother    Heart attack Father 64   Heart attack Paternal Uncle 56    Social History:  reports that he quit smoking about 5 years ago. His smoking use included cigarettes. He smoked an average of .5 packs per day. He has never used smokeless tobacco. He reports that he does not currently use alcohol. He reports that he does not currently use drugs after having used  the following drugs: Cocaine.  Allergies: No Known Allergies  No current facility-administered medications on file prior to encounter.   Current Outpatient Medications on File Prior to Encounter  Medication Sig Dispense Refill   Accu-Chek Softclix Lancets lancets Use as directed 100 each 0   albuterol (PROVENTIL) (2.5 MG/3ML) 0.083% nebulizer solution Take 3 mLs (2.5 mg total) by nebulization every 4 (four) hours as needed for wheezing or shortness  of breath. 30 vial 0   albuterol (VENTOLIN HFA) 108 (90 Base) MCG/ACT inhaler Inhale 2 puffs into the lungs every 4 (four) hours as needed for shortness of breath. 18 g 1   amiodarone (PACERONE) 200 MG tablet TAKE 1 TABLET (200 MG TOTAL) BY MOUTH DAILY (AM) 90 tablet 3   atorvastatin (LIPITOR) 20 MG tablet TAKE 1 TABLET (20 MG TOTAL) BY MOUTH DAILY (AM) 30 tablet 1   Blood Glucose Monitoring Suppl (BLOOD GLUCOSE MONITOR SYSTEM) w/Device KIT USE AS DIRECTED 1 kit 0   Blood Pressure Monitor DEVI Please provide patient with insurance approved blood pressure monitor ICD 10  I10 1 each 0   budesonide-formoterol (SYMBICORT) 80-4.5 MCG/ACT inhaler Inhale 2 puffs into the lungs in the morning and at bedtime. 10.2 g 0   carvedilol (COREG) 3.125 MG tablet TAKE 1 TABLET (3.125 MG TOTAL) BY MOUTH 2 (TWO) TIMES DAILY (AM+EVENING) 60 tablet 0   dapagliflozin propanediol (FARXIGA) 10 MG TABS tablet Take 1 tablet (10 mg total) by mouth daily before breakfast. 30 tablet 11   digoxin (LANOXIN) 0.125 MG tablet Take 0.5 tablets (0.0625 mg total) by mouth daily. NEEDS FOLLOW UP APPOINTMENT FOR ANYMORE REFILLS 30 tablet 1   ELIQUIS 5 MG TABS tablet TAKE 1 TABLET (5 MG TOTAL) BY MOUTH 2 (TWO) TIMES DAILY. 60 tablet 1   ENTRESTO 49-51 MG TAKE 1 TABLET BY MOUTH 2 (TWO) TIMES DAILY. (AM+EVENING) 60 tablet 0   mometasone-formoterol (DULERA) 100-5 MCG/ACT AERO Inhale 1 puff into the lungs daily.     OZEMPIC, 0.25 OR 0.5 MG/DOSE, 2 MG/3ML SOPN INJECT 0.5 MG INTO THE SKIN ONCE A WEEK. 3 mL 1   spironolactone (ALDACTONE) 25 MG tablet Take 1 tablet (25 mg total) by mouth daily. NEEDS FOLLOW UP APPOINTMENT FOR ANYMORE REFILLS 30 tablet 4   torsemide (DEMADEX) 20 MG tablet Take 1 tablet (20 mg total) by mouth 2 (two) times daily. Please call for office visit [phone] 60 tablet 0   traZODone (DESYREL) 100 MG tablet Take 0.5-1 tablets (50-100 mg total) by mouth at bedtime. 90 tablet 0   traZODone (DESYREL) 100 MG tablet Take 0.5-1  tablets (50-100 mg total) by mouth at bedtime. 90 tablet 1    Review of Systems: A full ROS was attempted today and was able to be performed.  Systems assessed include - Constitutional, Eyes, HENT, Respiratory, Cardiovascular, Gastrointestinal, Genitourinary, Integument/breast, Hematologic/lymphatic, Musculoskeletal, Neurological, Behavioral/Psych, Endocrine, Allergic/Immunologic - with pertinent responses as per HPI.  Physical Examination: Weight:  [130.6 kg] 130.6 kg (11/30 1521)  General - well nourished, well developed, in no apparent distress.    Ophthalmologic - fundi not visualized due to noncooperation.    Cardiovascular - irregularly irregular heart rate and rhythm.  Mental Status -  Level of arousal and orientation to time, place, and person were intact. Language including expression, naming, repetition, comprehension was assessed and found intact. Slow talking and mild dysarthria Fund of Knowledge was assessed and was intact.  Cranial Nerves II - XII - II - Vision intact OU. III, IV, VI -  Extraocular movements intact. V - Facial sensation intact bilaterally. VII - Facial movement intact bilaterally. VIII - Hearing & vestibular intact bilaterally. X - Palate elevates symmetrically. XI - Chin turning & shoulder shrug intact bilaterally. XII - Tongue protrusion intact.  Motor Strength - The patient's strength was normal in all extremities and pronator drift was absent.   Motor Tone & Bulk - Muscle tone was assessed at the neck and appendages and was normal.  Bulk was normal and fasciculations were absent.   Reflexes - The patient's reflexes were normal in all extremities and he had no pathological reflexes.  Sensory - Light touch, temperature/pinprick were assessed and were normal.    Coordination - The patient had normal movements in the hands with no ataxia or dysmetria.  Tremor was absent.  Gait and Station - deferred  NIHSS = 1 for dysarthria    Data  Reviewed: CT HEAD CODE STROKE WO CONTRAST  Result Date: 12/25/2021 CLINICAL DATA:  Code stroke.  Acute neuro deficit EXAM: CT HEAD WITHOUT CONTRAST TECHNIQUE: Contiguous axial images were obtained from the base of the skull through the vertex without intravenous contrast. RADIATION DOSE REDUCTION: This exam was performed according to the departmental dose-optimization program which includes automated exposure control, adjustment of the mA and/or kV according to patient size and/or use of iterative reconstruction technique. COMPARISON:  CT head 05/24/2019 FINDINGS: Brain: Chronic infarct left operculum and middle frontal lobe unchanged from the prior study. Mild hypodensity right frontal white matter also unchanged. Negative for acute infarct, hemorrhage, mass, hydrocephalus Vascular: Negative for hyperdense vessel Skull: Negative Sinuses/Orbits: Paranasal sinuses clear.  Negative orbit Other: None ASPECTS ([place] Stroke Program Early CT Score) - Ganglionic level infarction (caudate, lentiform nuclei, internal capsule, insula, M1-M3 cortex): 7 - Supraganglionic infarction (M4-M6 cortex): 3 Total score (0-10 with 10 being normal): 10 IMPRESSION: 1. No acute abnormality. 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 2. Aspects is 10. 3. Code stroke imaging results were communicated on 12/25/2021 at 3:39 pm to provider Erlinda Hong via [facility] text page Electronically Signed   By: Franchot Gallo M.D.   On: 12/25/2021 15:41    Assessment: 61 y.o. male PMH of PAF on eliquis, CHF on GDMT, PE, HTN, OSA, stroke presented to ED for dizziness, wooziness, sweating while driving to work after taking the meds today. 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 His BP in ER was in 90s and afib RVR with HR 130s. Received NS bolus. LSW 1:45pm. NIHSS = 1 for dysarthria. Pt not TNK candidate due to on eliquis and likely not stroke. Etiology for the symptoms likely medication related. Her dysarthria likely recrudescence from  his old left MCA infarct. Will close monitoring BP and HR, NS bolus and IVF. Treat afib RVR per EDP. If dysarthria not getting better over time, will consider MRI.   Plan: neuro checks Telemetry monitoring Continue NS bolus and IVF Afib RVR management per EDP Discussed with Dr. Angelene Giovanni ED physician We will sign off for now. Please call with further questions.   Thank you for this consultation and allowing [place] to participate in the care of this patient.  Rosalin Hawking, MD PhD Stroke Neurology 12/25/2021 5:08 PM

## 2021-12-25 NOTE — ED Notes (Signed)
Repeat trop collected and sent to lab.

## 2021-12-25 NOTE — ED Provider Notes (Signed)
Supervised resident visit.  Patient activated as a code stroke in triage due to dizziness, dysarthria.  However has a history of stroke with speech difficulty from that.  He upon my evaluation states that he thinks that he took too much of his medicine today.  He takes pill packets that are preset up for him.  Sounds like he is got confusion about his medications.  Not quite sure if he took too much of a medicine or has been missing some medications.  He does not have great insight with this process.  He is on amiodarone and blood thinners for A-fib.  Has a history of congestive heart failure but my review of his last echocardiogram several months ago showed normal EF.  He has a history of COPD, hypertension, diabetes.  Neurology at the bedside.  He had a negative CT of his head.  Ultimately he has no focal symptoms to suggest stroke.  Neurology team is signed off.  When patient got a full set of vitals he has borderline hypotension with blood pressure in the upper 80s and low 90s.  His heart rate is between 110 and 140  Otherwise vitals are unremarkable.  EKG per my review and interpretation shows atrial fibrillation with RVR.  Overall somewhat confusing presentation as he thinks that he is taking too much of his medicine which kind of goes against him having A-fib with RVR because most of his medications are rate control meds.  He is on some diuretics as well.  Differential is wide.  He denies any melena or hematochezia.  This does not seem to be GI bleed.  He is in normal temperature and doubt sepsis.  Not sure if this is all A-fib with RVR driving hypotension but do not think that he would be a great cardioversion candidate given I am not quite confident about medications that he has been taking or not taking.  This could be dehydration or if he is over taking some medications this could be lying down/medication side effect.  Will cast broad workup with CBC, CMP, troponin, BNP, chest x-ray.  Will focus on IV  fluid hydration at this time and will give 2 L of fluid.  Does not seem to be volume overloaded.  Chest x-ray per my review and interpretation shows no pneumonia.  No pulmonary edema.  Creatinine mildly elevated from baseline at 1.5.  No significant anemia or leukocytosis.  Blood work otherwise unremarkable.  Troponin, BNP, digoxin level still pending.  Does have a history of massive PE and therefore we will get a CT scan of his chest to further evaluate.  Will hold off on cardioversion or amiodarone bolus and infusion at this time as as he is getting IV fluids heart rate appears to be improving and blood pressure seems to be stabilizing.  CT of the chest shows no acute findings.  BNP is mildly elevated at 588.  Troponin is normal.  Lactic acid is normal.  Blood pressure appears to be stabilizing with IV fluids.  Heart rate is improving.  My suspicion is that this is an accidental overdose on maybe his home medications.  I think there is a lot of confusion about his medication packet and.  There seems to be some days that are missed and that should not be.  There does appear to be 1 day that has less pills and other days.  I think safest thing for patient is to have an observation stay, get a good medication review, continue  to get some gentle fluid hydration and stabilization.  Will admit to medicine.  This chart was dictated using voice recognition software.  Despite best efforts to proofread,  errors can occur which can change the documentation meaning.      Lennice Sites, DO 12/25/21 1940

## 2021-12-25 NOTE — H&P (Signed)
History and Physical    Donald [name]  MRN:[id number]  DOB: 09/29/1960  DOA: 12/25/2021 PCP: Fleming, Zelda W, NP   Patient coming from: home  Chief Complaint: lightheaded  HPI: Donald [name] is a 61 y.o. male with medical history of CVA, A-fib, CHF, PE, OSA and HTN. He presented to the ED because he felt "woozy" today. This started after he took his medications and while he was at work. He did not have any other symptoms such as shortness of breath, palpitations or chest pain. He has overall been feeling relatively well lately.  He was found to have A-fib with RVR in the ED alone with hypotension. He states he takes his medications as ordered. He has a h/o of an ablation in the past and there are plans for another ablation in January.   ED Course: in the ED, found to have a BP at low as 81/65 and HR in the 120s.  CT head was negative.  Code stroke called and neuro did not feel that he met criteria for TNK as he takes Eliquis and the suspicion that his symptoms are related to a CVA are low.   Review of Systems:  All other systems reviewed and apart from HPI, are negative.  Past Medical History:  Diagnosis Date   Arrhythmia    A fib    Atrial fibrillation (HCC)    CHF (congestive heart failure) (HCC)    COPD (chronic obstructive pulmonary disease) (HCC)    Diabetes mellitus without complication (HCC)    Hypertension    Sleep apnea    Stroke (HCC)     Past Surgical History:  Procedure Laterality Date   ABLATION     CARDIOVERSION N/A 03/12/2020   Procedure: CARDIOVERSION;  Surgeon: Bethel, Tiffany, MD;  Location: [facility];  Service: Cardiovascular;  Laterality: N/A;   CARDIOVERSION N/A 08/13/2020   Procedure: CARDIOVERSION;  Surgeon: Bensimhon, Daniel R, MD;  Location: [facility];  Service: Cardiovascular;  Laterality: N/A;   CARDIOVERSION N/A 09/02/2020   Procedure: CARDIOVERSION;  Surgeon: Acharya, Gayatri A, MD;  Location: [facility];  Service: Cardiovascular;   Laterality: N/A;   INCISION AND DRAINAGE PERIRECTAL ABSCESS N/A 10/20/2012   Procedure: IRRIGATION AND DEBRIDEMENT PERIRECTAL ABSCESS;  Surgeon: Matthew K. Tsuei, MD;  Location: [facility] OR;  Service: General;  Laterality: N/A;  Lithotomy   RIGHT/LEFT HEART CATH AND CORONARY ANGIOGRAPHY N/A 08/12/2020   Procedure: RIGHT/LEFT HEART CATH AND CORONARY ANGIOGRAPHY;  Surgeon: Bensimhon, Daniel R, MD;  Location: [facility];  Service: Cardiovascular;  Laterality: N/A;   TEE WITHOUT CARDIOVERSION N/A 03/12/2020   Procedure: TRANSESOPHAGEAL ECHOCARDIOGRAM (TEE);  Surgeon: Violet, Tiffany, MD;  Location: [facility];  Service: Cardiovascular;  Laterality: N/A;   TEE WITHOUT CARDIOVERSION N/A 08/13/2020   Procedure: TRANSESOPHAGEAL ECHOCARDIOGRAM (TEE);  Surgeon: Bensimhon, Daniel R, MD;  Location: [facility];  Service: Cardiovascular;  Laterality: N/A;    Social History:   reports that he quit smoking about 5 years ago. His smoking use included cigarettes. He smoked an average of .5 packs per day. He has never used smokeless tobacco. He reports that he does not currently use alcohol. He reports that he does not currently use drugs after having used the following drugs: Cocaine.  No Known Allergies  Family History  Problem Relation Age of Onset   Breast cancer Mother    Heart attack Father 65   Heart attack Paternal Uncle 72     Prior to Admission medications   Medication   Sig Start Date End Date Taking? Authorizing Provider  Accu-Chek Softclix Lancets lancets Use as directed 08/15/20   Ezenduka, Nkeiruka J, MD  albuterol (PROVENTIL) (2.5 MG/3ML) 0.083% nebulizer solution Take 3 mLs (2.5 mg total) by nebulization every 4 (four) hours as needed for wheezing or shortness of breath. 10/07/15   Belfi, Melanie, MD  albuterol (VENTOLIN HFA) 108 (90 Base) MCG/ACT inhaler Inhale 2 puffs into the lungs every 4 (four) hours as needed for shortness of breath. 07/14/21 01/30/29  Fleming, Zelda W, NP  amiodarone  (PACERONE) 200 MG tablet TAKE 1 TABLET (200 MG TOTAL) BY MOUTH DAILY (AM) 09/03/21   Bensimhon, Daniel R, MD  atorvastatin (LIPITOR) 20 MG tablet TAKE 1 TABLET (20 MG TOTAL) BY MOUTH DAILY (AM) 09/03/21   Bensimhon, Daniel R, MD  Blood Glucose Monitoring Suppl (BLOOD GLUCOSE MONITOR SYSTEM) w/Device KIT USE AS DIRECTED 08/15/20   Ezenduka, Nkeiruka J, MD  Blood Pressure Monitor DEVI Please provide patient with insurance approved blood pressure monitor ICD 10  I10 07/14/21   Fleming, Zelda W, NP  budesonide-formoterol (SYMBICORT) 80-4.5 MCG/ACT inhaler Inhale 2 puffs into the lungs in the morning and at bedtime. 08/15/20 09/14/20  Ezenduka, Nkeiruka J, MD  carvedilol (COREG) 3.125 MG tablet TAKE 1 TABLET (3.125 MG TOTAL) BY MOUTH 2 (TWO) TIMES DAILY (AM+EVENING) 10/28/21   Milford, Jessica M, FNP  dapagliflozin propanediol (FARXIGA) 10 MG TABS tablet Take 1 tablet (10 mg total) by mouth daily before breakfast. 12/12/20   Bensimhon, Daniel R, MD  digoxin (LANOXIN) 0.125 MG tablet Take 0.5 tablets (0.0625 mg total) by mouth daily. NEEDS FOLLOW UP APPOINTMENT FOR ANYMORE REFILLS 05/23/21   Milford, Jessica M, FNP  ELIQUIS 5 MG TABS tablet TAKE 1 TABLET (5 MG TOTAL) BY MOUTH 2 (TWO) TIMES DAILY. 09/03/21   Bensimhon, Daniel R, MD  ENTRESTO 49-51 MG TAKE 1 TABLET BY MOUTH 2 (TWO) TIMES DAILY. (AM+EVENING) 10/28/21   Milford, Jessica M, FNP  mometasone-formoterol (DULERA) 100-5 MCG/ACT AERO Inhale 1 puff into the lungs daily.    [provider]  OZEMPIC, 0.25 OR 0.5 MG/DOSE, 2 MG/3ML SOPN INJECT 0.5 MG INTO THE SKIN ONCE A WEEK. 09/02/21   Fleming, Zelda W, NP  spironolactone (ALDACTONE) 25 MG tablet Take 1 tablet (25 mg total) by mouth daily. NEEDS FOLLOW UP APPOINTMENT FOR ANYMORE REFILLS 05/23/21   Clegg, Amy D, NP  torsemide (DEMADEX) 20 MG tablet Take 1 tablet (20 mg total) by mouth 2 (two) times daily. Please call for office visit [phone] 09/03/21   Bensimhon, Daniel R, MD  traZODone (DESYREL) 100 MG tablet  Take 0.5-1 tablets (50-100 mg total) by mouth at bedtime. 07/14/21   Fleming, Zelda W, NP  traZODone (DESYREL) 100 MG tablet Take 0.5-1 tablets (50-100 mg total) by mouth at bedtime. 07/14/21   Fleming, Zelda W, NP    Physical Exam: Wt Readings from Last 3 Encounters:  12/25/21 130.6 kg  07/18/21 133.6 kg  03/31/21 (!) 138.8 kg   Vitals:   12/25/21 1800 12/25/21 1850 12/25/21 1900 12/25/21 1930  BP: 102/73 104/82 101/82 101/62  Pulse: (!) 126 (!) 27 (!) 106 (!) 107  Resp: 20 (!) 22 (!) 23 20  SpO2: 98% 97% 98% 95%  Weight:      Height:          Constitutional:  Calm & comfortable Eyes: PERRLA, lids and conjunctivae normal ENT:  Mucous membranes are moist.  Pharynx clear of exudate   Normal dentition.  Respiratory:  Clear to   auscultation bilaterally  Normal respiratory effort.  Cardiovascular:  S1 & S2 heard, regular rate and rhythm No Murmurs Abdomen:  Non distended No tenderness, No masses Bowel sounds normal Extremities:  No clubbing / cyanosis No pedal edema  Skin:  No rashes, lesions or ulcers Neurologic:  AAO x 3 CN 2-12 grossly intact Sensation intact Strength 5/5 in all 4 extremities Psychiatric:  Normal Mood and affect    Labs on Admission: I have personally reviewed following labs and imaging studies  CBC: Recent Labs  Lab 12/25/21 1519 12/25/21 1527  WBC 7.6  --   NEUTROABS 5.4  --   HGB 17.8* 19.4*  HCT 54.1* 57.0*  MCV 85.7  --   PLT 252  --    Basic Metabolic Panel: Recent Labs  Lab 12/25/21 1519 12/25/21 1527 12/25/21 1537  NA 139 140  --   K 3.9 3.8  --   CL 99 99  --   CO2 28  --   --   GLUCOSE 181* 186*  --   BUN 13 15  --   CREATININE 1.52* 1.50*  --   CALCIUM 9.3  --   --   MG  --   --  1.9   GFR: Estimated Creatinine Clearance: 74.1 mL/min (A) (by C-G formula based on SCr of 1.5 mg/dL (H)). Liver Function Tests: Recent Labs  Lab 12/25/21 1519  AST 21  ALT 16  ALKPHOS 77  BILITOT 0.9  PROT 7.6  ALBUMIN 4.1    No results for input(s): "LIPASE", "AMYLASE" in the last 168 hours. No results for input(s): "AMMONIA" in the last 168 hours. Coagulation Profile: Recent Labs  Lab 12/25/21 1519  INR 1.2   Cardiac Enzymes: No results for input(s): "CKTOTAL", "CKMB", "CKMBINDEX", "TROPONINI" in the last 168 hours. BNP (last 3 results) No results for input(s): "PROBNP" in the last 8760 hours. HbA1C: No results for input(s): "HGBA1C" in the last 72 hours. CBG: Recent Labs  Lab 12/25/21 1520  GLUCAP 199*   Lipid Profile: No results for input(s): "CHOL", "HDL", "LDLCALC", "TRIG", "CHOLHDL", "LDLDIRECT" in the last 72 hours. Thyroid Function Tests: No results for input(s): "TSH", "T4TOTAL", "FREET4", "T3FREE", "THYROIDAB" in the last 72 hours. Anemia Panel: No results for input(s): "VITAMINB12", "FOLATE", "FERRITIN", "TIBC", "IRON", "RETICCTPCT" in the last 72 hours. Urine analysis:    Component Value Date/Time   COLORURINE AMBER (A) 03/08/2020 0106   APPEARANCEUR CLEAR 03/08/2020 0106   LABSPEC 1.019 03/08/2020 0106   PHURINE 6.0 03/08/2020 0106   GLUCOSEU NEGATIVE 03/08/2020 0106   HGBUR NEGATIVE 03/08/2020 0106   BILIRUBINUR SMALL (A) 03/08/2020 0106   KETONESUR NEGATIVE 03/08/2020 0106   PROTEINUR 30 (A) 03/08/2020 0106   UROBILINOGEN 1.0 10/20/2012 1340   NITRITE NEGATIVE 03/08/2020 0106   LEUKOCYTESUR NEGATIVE 03/08/2020 0106   Sepsis Labs: _0 (procalcitonin:4,lacticidven:4) )No results found for this or any previous visit (from the past 240 hour(s)).   Radiological Exams on Admission: CT Angio Chest PE W and/or Wo Contrast  Result Date: 12/25/2021 CLINICAL DATA:  Sudden onset slurred speech and dizziness 1 hour ago, short of breath EXAM: CT ANGIOGRAPHY CHEST WITH CONTRAST TECHNIQUE: Multidetector CT imaging of the chest was performed using the standard protocol during bolus administration of intravenous contrast. Multiplanar CT image reconstructions and MIPs were obtained  to evaluate the vascular anatomy. RADIATION DOSE REDUCTION: This exam was performed according to the departmental dose-optimization program which includes automated exposure control, adjustment of the mA and/or kV according to patient size and/or  use of iterative reconstruction technique. CONTRAST:  65mL OMNIPAQUE IOHEXOL 350 MG/ML SOLN COMPARISON:  12/25/2021, 08/06/2020 FINDINGS: Cardiovascular: This is a technically suboptimal evaluation of the pulmonary vasculature due to poor timing of the contrast bolus. There is sufficient enhancement of the central and proximal segmental pulmonary vasculature to exclude pulmonary emboli within that distribution. The distal segmental and subsegmental branches are incompletely opacified and cannot be evaluated. The heart is unremarkable without pericardial effusion. There is progressive atherosclerosis of the coronary vasculature greatest at the LAD distribution. Normal caliber of the thoracic aorta. Stable atherosclerosis of the aortic arch. Mediastinum/Nodes: No enlarged mediastinal, hilar, or axillary lymph nodes. Thyroid gland, trachea, and esophagus demonstrate no significant findings. Small hiatal hernia. Lungs/Pleura: No acute airspace disease, effusion, or pneumothorax. The central airways are patent. Upper Abdomen: No acute abnormality. Musculoskeletal: No acute or destructive bony lesions. Severe degenerative changes of the bilateral shoulders. Reconstructed images demonstrate no additional findings. Review of the MIP images confirms the above findings. IMPRESSION: 1. Suboptimal opacification of the pulmonary vasculature. There are no central or proximal segmental pulmonary emboli. Distal segmental and subsegmental branches of the pulmonary vasculature cannot be fully evaluated due to timing of contrast bolus. 2. No acute intrathoracic process. 3. Small hiatal hernia. 4. Aortic Atherosclerosis (ICD10-I70.0). Progressive coronary artery atherosclerosis. Electronically  Signed   By: Michael  Brown M.D.   On: 12/25/2021 18:42   DG Chest Portable 1 View  Result Date: 12/25/2021 CLINICAL DATA:  Shortness of breath and dizziness. EXAM: PORTABLE CHEST 1 VIEW COMPARISON:  11/17/2021 FINDINGS: Single-view of the chest was obtained. Both lungs are clear. Heart and mediastinum are within normal limits. Trachea is midline. Negative for a pneumothorax. No acute bone abnormality. IMPRESSION: No active disease. Electronically Signed   By: Adam  Henn M.D.   On: 12/25/2021 16:09   CT HEAD CODE STROKE WO CONTRAST  Result Date: 12/25/2021 CLINICAL DATA:  Code stroke.  Acute neuro deficit EXAM: CT HEAD WITHOUT CONTRAST TECHNIQUE: Contiguous axial images were obtained from the base of the skull through the vertex without intravenous contrast. RADIATION DOSE REDUCTION: This exam was performed according to the departmental dose-optimization program which includes automated exposure control, adjustment of the mA and/or kV according to patient size and/or use of iterative reconstruction technique. COMPARISON:  CT head 05/24/2019 FINDINGS: Brain: Chronic infarct left operculum and middle frontal lobe unchanged from the prior study. Mild hypodensity right frontal white matter also unchanged. Negative for acute infarct, hemorrhage, mass, hydrocephalus Vascular: Negative for hyperdense vessel Skull: Negative Sinuses/Orbits: Paranasal sinuses clear.  Negative orbit Other: None ASPECTS ([place] Stroke Program Early CT Score) - Ganglionic level infarction (caudate, lentiform nuclei, internal capsule, insula, M1-M3 cortex): 7 - Supraganglionic infarction (M4-M6 cortex): 3 Total score (0-10 with 10 being normal): 10 IMPRESSION: 1. No acute abnormality. 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 2. Aspects is 10. 3. Code stroke imaging results were communicated on 12/25/2021 at 3:39 pm to provider Xu via amion text page Electronically Signed   By: Charles  Clark M.D.   On: 12/25/2021 15:41     EKG: Independently reviewed. A-fib with HR 136, QTc 530  Assessment/Plan Principal Problem:   Hypotension- likely dehydration with AKI (acute kidney injury)    Atrial fibrillation with RVR   HFpEF with improved EF - has had cardioversion and ablation in the past - given a total of 2 L IVF in ED for dehydration - ECHO 08/06/21> EF 55-60%, no diastolic dysfunction- previously EF ~ 20-25% -  SBP   improved to low 100s  - hold diuretics- recheck Cr in AM - HR still > 100- cont Amiodarone & Digoxin which are home medications - have called on call [facility] fellow who will see patient and further assist with A-fib  Active Problems:    Prolonged QTc on EKG - follow    COPD (chronic obstructive pulmonary disease) (HCC) - no exacerbation currently - resume Symbicort  Polycythemia - Hgb 17.8 and 19.4 today - recheck Hgb in AM as he may be hemo concentrated - Hgb this past year has ranged from 15-17 - not hypoxic in ED  DM 2 - hold Farxiga and Ozempic for now - Novolog SSI ordered - check A1c- last A1c was checked in 2/23 and was 7.3     OSA   - will order CPAP  Obesity Body mass index is 39.05 kg/m.   Aphasia as late effect of cerebrovascular accident (CVA)     DVT prophylaxis: Eliquis  Code Status: DNR  Consults called: [facility] cardiology fellow  Admission status:  Level of care: Telemetry Medical  Saima Rizwan MD [facility]    12/25/2021, 7:53 PM    

## 2021-12-25 NOTE — Consult Note (Signed)
Cardiology Consultation   Patient ID: Donald [name] MRN: [id number]; DOB: [birth year]  Admit date: 12/25/2021 Date of Consult: 12/25/2021  PCP:  Gildardo Pounds, NP   [facility] Providers Cardiologist:  None  Electrophysiologist:  Vickie Epley, MD       Patient Profile:   Donald [name] is a 61 y.o. male with a history of a fib, A fib ablation, CVA 2018, COPD, OSA, HTN, obesity, and chronic systolic heart failure who is being seen 12/25/2021 for the evaluation of A.Fib at the request of Dr. Wynelle Cleveland.  History of Present Illness:   Donald [name] is a 60 year old male with a history of A-fib/flutter status post a flutter ablation in 2019, multiple cardioversions last in 2022, CVA, COPD, chronic systolic heart failure, nonobstructive CAD who presented with lightheadedness found to be in A-fib with rapid ventricular rate.  Briefly, patient states that he has been getting medication from the pharmacy that he thinks is incorrectly dosing his medications.  He states that he has been consistently taking wets in his prepackaged med carts.  Today he noticed himself becoming "woozy" and so he came to the emergency department.  He denies any chest pain, palpitations, syncope, fever, chills, changes in oral intake.  He denies any shortness of breath or lower extremity edema.  He cannot symptomatically tell when he is in or out of atrial fibrillation.  His recent EKG was 07/17/2021 for which she was in sinus rhythm.  Unclear how long he has been in A-fib since that time.  He had an updated transthoracic echocardiogram in July with improved EF to 50 to 60%.  Left atrial diameter 5.4 cm.  On presentation he was noted to be somewhat hypotensive with A-fib and rates in the 130s.  Both his blood pressure and rate control improved with IV fluid resuscitation.  In conjunction he was noted to have a AKI with creatinine 1.5 as well as polycythemia.  Troponin ruled out.  BNP 588.  On exam, he  currently has no complaints.   Past Medical History:  Diagnosis Date   Arrhythmia    A fib    Atrial fibrillation (HCC)    CHF (congestive heart failure) (HCC)    COPD (chronic obstructive pulmonary disease) ([place])    Diabetes mellitus without complication ([place])    Hypertension    Sleep apnea    Stroke [facility])     Past Surgical History:  Procedure Laterality Date   ABLATION     CARDIOVERSION N/A 03/12/2020   Procedure: CARDIOVERSION;  Surgeon: Skeet Latch, MD;  Location: [place];  Service: Cardiovascular;  Laterality: N/A;   CARDIOVERSION N/A 08/13/2020   Procedure: CARDIOVERSION;  Surgeon: Jolaine Artist, MD;  Location: [place];  Service: Cardiovascular;  Laterality: N/A;   CARDIOVERSION N/A 09/02/2020   Procedure: CARDIOVERSION;  Surgeon: Elouise Munroe, MD;  Location: [place];  Service: Cardiovascular;  Laterality: N/A;   INCISION AND DRAINAGE PERIRECTAL ABSCESS N/A 10/20/2012   Procedure: IRRIGATION AND DEBRIDEMENT PERIRECTAL ABSCESS;  Surgeon: Imogene Burn. Georgette Dover, MD;  Location: [place];  Service: General;  Laterality: N/A;  Lithotomy   RIGHT/LEFT HEART CATH AND CORONARY ANGIOGRAPHY N/A 08/12/2020   Procedure: RIGHT/LEFT HEART CATH AND CORONARY ANGIOGRAPHY;  Surgeon: Jolaine Artist, MD;  Location: [place] CV LAB;  Service: Cardiovascular;  Laterality: N/A;   TEE WITHOUT CARDIOVERSION N/A 03/12/2020   Procedure: TRANSESOPHAGEAL ECHOCARDIOGRAM (TEE);  Surgeon: Skeet Latch, MD;  Location: [place];  Service: Cardiovascular;  Laterality:  N/A;   TEE WITHOUT CARDIOVERSION N/A 08/13/2020   Procedure: TRANSESOPHAGEAL ECHOCARDIOGRAM (TEE);  Surgeon: Jolaine Artist, MD;  Location[facility] ENDOSCOPY;  Service: Cardiovascular;  Laterality: N/A;     Home Medications:  Prior to Admission medications   Medication Sig Start Date End Date Taking? Authorizing Provider  albuterol (PROVENTIL) (2.5 MG/3ML) 0.083% nebulizer solution Take 3 mLs (2.5 mg total) by  nebulization every 4 (four) hours as needed for wheezing or shortness of breath. 10/07/15  Yes Malvin Johns, MD  albuterol (VENTOLIN HFA) 108 (90 Base) MCG/ACT inhaler Inhale 2 puffs into the lungs every 4 (four) hours as needed for shortness of breath. 07/14/21 01/30/29 Yes Gildardo Pounds, NP  amiodarone (PACERONE) 200 MG tablet TAKE 1 TABLET (200 MG TOTAL) BY MOUTH DAILY (AM) Patient taking differently: Take 200 mg by mouth daily. 09/03/21  Yes Bensimhon, Shaune Pascal, MD  atorvastatin (LIPITOR) 20 MG tablet TAKE 1 TABLET (20 MG TOTAL) BY MOUTH DAILY (AM) Patient taking differently: Take 20 mg by mouth daily. 09/03/21  Yes Bensimhon, Shaune Pascal, MD  carvedilol (COREG) 3.125 MG tablet TAKE 1 TABLET (3.125 MG TOTAL) BY MOUTH 2 (TWO) TIMES DAILY (AM+EVENING) Patient taking differently: Take 3.125 mg by mouth 2 (two) times daily with a meal. 10/28/21  Yes Milford, Maricela Bo, FNP  dapagliflozin propanediol (FARXIGA) 10 MG TABS tablet Take 1 tablet (10 mg total) by mouth daily before breakfast. 12/12/20  Yes Bensimhon, Shaune Pascal, MD  digoxin (LANOXIN) 0.125 MG tablet Take 0.5 tablets (0.0625 mg total) by mouth daily. NEEDS FOLLOW UP APPOINTMENT FOR ANYMORE REFILLS 05/23/21  Yes Milford, Jessica M, FNP  ELIQUIS 5 MG TABS tablet TAKE 1 TABLET (5 MG TOTAL) BY MOUTH 2 (TWO) TIMES DAILY. 09/03/21  Yes Bensimhon, Shaune Pascal, MD  ENTRESTO 49-51 MG TAKE 1 TABLET BY MOUTH 2 (TWO) TIMES DAILY. (AM+EVENING) Patient taking differently: Take 1 tablet by mouth 2 (two) times daily. 10/28/21  Yes Milford, Maricela Bo, FNP  spironolactone (ALDACTONE) 25 MG tablet Take 1 tablet (25 mg total) by mouth daily. NEEDS FOLLOW UP APPOINTMENT FOR ANYMORE REFILLS 05/23/21  Yes Clegg, Amy D, NP  torsemide (DEMADEX) 20 MG tablet Take 1 tablet (20 mg total) by mouth 2 (two) times daily. Please call for office visit [phone] 09/03/21  Yes Bensimhon, Shaune Pascal, MD  traZODone (DESYREL) 100 MG tablet Take 0.5-1 tablets (50-100 mg total) by mouth at bedtime.  07/14/21  Yes Gildardo Pounds, NP  Accu-Chek Softclix Lancets lancets Use as directed 08/15/20   Alma Friendly, MD  Blood Glucose Monitoring Suppl (BLOOD GLUCOSE MONITOR SYSTEM) w/Device KIT USE AS DIRECTED 08/15/20   Alma Friendly, MD  Blood Pressure Monitor DEVI Please provide patient with insurance approved blood pressure monitor ICD 10  I10 07/14/21   Gildardo Pounds, NP  budesonide-formoterol (SYMBICORT) 80-4.5 MCG/ACT inhaler Inhale 2 puffs into the lungs in the morning and at bedtime. 08/15/20 09/14/20  Alma Friendly, MD  mometasone-formoterol (DULERA) 100-5 MCG/ACT AERO Inhale 1 puff into the lungs daily.    [provider]  OZEMPIC, 0.25 OR 0.5 MG/DOSE, 2 MG/3ML SOPN INJECT 0.5 MG INTO THE SKIN ONCE A WEEK. Patient taking differently: Inject 0.5 mg into the skin once a week. 09/02/21   Gildardo Pounds, NP    Inpatient Medications: Scheduled Meds:  [START ON 12/26/2021] amiodarone  200 mg Oral Daily   apixaban  5 mg Oral BID   [START ON 12/26/2021] atorvastatin  20 mg Oral Daily  carvedilol  3.125 mg Oral BID WC   [START ON 12/26/2021] digoxin  0.0625 mg Oral Daily   [START ON 12/26/2021] fluticasone furoate-vilanterol  1 puff Inhalation Daily   [START ON 12/26/2021] insulin aspart  0-15 Units Subcutaneous TID WC   sodium chloride flush  3 mL Intravenous Q12H   Continuous Infusions:  sodium chloride     PRN Meds: sodium chloride, acetaminophen **OR** acetaminophen, sodium chloride flush  Allergies:   No Known Allergies  Social History:   Social History   Socioeconomic History   Marital status: Single    Spouse name: Not on file   Number of children: Not on file   Years of education: Not on file   Highest education level: Not on file  Occupational History   Not on file  Tobacco Use   Smoking status: Former    Packs/day: 0.50    Types: Cigarettes    Quit date: 2018    Years since quitting: 5.9   Smokeless tobacco: Never  Substance and Sexual  Activity   Alcohol use: Not Currently   Drug use: Not Currently    Types: Cocaine   Sexual activity: Not Currently  Other Topics Concern   Not on file  Social History Narrative   Not on file   Social Determinants of Health   Financial Resource Strain: Medium Risk (08/14/2020)   Overall Financial Resource Strain (CARDIA)    Difficulty of Paying Living Expenses: Somewhat hard  Food Insecurity: Food Insecurity Present (08/14/2020)   Hunger Vital Sign    Worried About Running Out of Food in the Last Year: Never true    Ran Out of Food in the Last Year: Sometimes true  Transportation Needs: Unmet Transportation Needs (08/14/2020)   PRAPARE - Transportation    Lack of Transportation (Medical): Yes    Lack of Transportation (Non-Medical): Yes  Physical Activity: Insufficiently Active (11/12/2020)   Exercise Vital Sign    Days of Exercise per Week: 3 days    Minutes of Exercise per Session: 20 min  Stress: No Stress Concern Present (11/12/2020)   [place]    Feeling of Stress : Only a little  Social Connections: Moderately Isolated (09/23/2020)   Social Connection and Isolation Panel [NHANES]    Frequency of Communication with Friends and Family: More than three times a week    Frequency of Social Gatherings with Friends and Family: More than three times a week    Attends Religious Services: 1 to 4 times per year    Active Member of [employer] or Organizations: No    Attends Archivist Meetings: Never    Marital Status: Never married  Intimate Partner Violence: Not At Risk (09/23/2020)   Humiliation, Afraid, Rape, and Kick questionnaire    Fear of Current or Ex-Partner: No    Emotionally Abused: No    Physically Abused: No    Sexually Abused: No    Family History:    Family History  Problem Relation Age of Onset   Breast cancer Mother    Heart attack Father 30   Heart attack Paternal Uncle 25     ROS:   Please see the history of present illness.   All other ROS reviewed and negative.     Physical Exam/Data:   Vitals:   12/25/21 1930 12/25/21 2100 12/25/21 2107 12/25/21 2115  BP: 101/62 (!) 110/98  106/77  Pulse: (!) 107   93  Resp: 20 19  18  Temp:   97.8 F (36.6 C)   SpO2: 95% 97%  91%  Weight:      Height:        Intake/Output Summary (Last 24 hours) at 12/25/2021 2337 Last data filed at 12/25/2021 1839 Gross per 24 hour  Intake 2000 ml  Output --  Net 2000 ml      12/25/2021    3:21 PM 07/18/2021    8:10 AM 03/31/2021    9:22 AM  Last 3 Weights  Weight (lbs) 287 lb 14.7 oz 294 lb 9.6 oz 306 lb  Weight (kg) 130.6 kg 133.63 kg 138.801 kg     Body mass index is 39.05 kg/m.  General:  Well nourished, well developed, in no acute distress\ HEENT: normal Neck: no JVD Vascular: No carotid bruits; Distal pulses 2+ bilaterally Cardiac:  normal S1, S2; irregularly irregular with rates in 100s; no murmur  Lungs:  clear to auscultation bilaterally, no wheezing, rhonchi or rales  Abd: soft, nontender, no hepatomegaly  Ext: no edema Musculoskeletal:  No deformities, BUE and BLE strength normal and equal Skin: warm and dry  Neuro:  CNs 2-12 intact, no focal abnormalities noted Psych:  Normal affect   EKG:  The EKG was personally reviewed and demonstrates:  A.Fib with RVR with rates in 130s Telemetry:  Telemetry was personally reviewed and demonstrates:  A.fib with rates < 110s most recently  Relevant CV Studies: Cardiac Testing  - DCCV 19-Sep-2020-->SB - DC-CV 08/13/20 --200 j x1--> NSR  - DC-CV 03/13/2019--->NSR    - Had A flutter ablation in 2019    - [facility] 08/12/2020:  . Mid RCA lesion is 60% stenosed.   Prox RCA lesion is 30% stenosed.   RPDA lesion is 30% stenosed.   1st Mrg lesion is 30% stenosed.   Prox LAD to Mid LAD lesion is 20% stenosed.   2nd Diag lesion is 40% stenosed.   The left ventricular ejection fraction is less than 25% by visual estimate.   Findings:    Ao = 109/81 (93)  LV = 102/9  RA = 7  RV = 46/4  PA = 49/17 (32)  PCW = 16 (v = 25)  Fick cardiac output/index = 5.5/2.3  PVR = 2.9  Ao sat = 97%  PA sat = 67%,68%    - TEE 08/13/2020 EF 20% + smoke no clot   - Echo 07/2020 EF down 30-35% with RV moerately reduced   - Echo 03/08/2020 EF 40-45% RV mildly reduced, RA/LA severely dilated, mild-mod MV regurgitatio   - CTA 03/08/2020: Partially occlusive thrombus within segmental and subsegmental arterial branches within the bilateral lungs as described above. No evidence of right ventricular heart strain. Small right pleural effusion. Bilateral patchy ground-glass opacity seen within both lung bases right greater than left which could be due to atelectasis, or infectious etiology.   - Nuclear Stress Test 2018  1. No reversible ischemia or infarction.  2. Mild generalized hypokinesis. No focal left ventricular wall  motion defect beyond mild generalized hypokinesis.  3. Left ventricular ejection fraction 43%    Laboratory Data:  High Sensitivity Troponin:   Recent Labs  Lab 12/25/21 1537 12/25/21 1849  TROPONINIHS 15 13     Chemistry Recent Labs  Lab 12/25/21 1519 12/25/21 1527 12/25/21 1537  NA 139 140  --   K 3.9 3.8  --   CL 99 99  --   CO2 28  --   --   GLUCOSE  181* 186*  --   BUN 13 15  --   CREATININE 1.52* 1.50*  --   CALCIUM 9.3  --   --   MG  --   --  1.9  GFRNONAA 52*  --   --   ANIONGAP 12  --   --     Recent Labs  Lab 12/25/21 1519  PROT 7.6  ALBUMIN 4.1  AST 21  ALT 16  ALKPHOS 77  BILITOT 0.9   Lipids No results for input(s): "CHOL", "TRIG", "HDL", "LABVLDL", "LDLCALC", "CHOLHDL" in the last 168 hours.  Hematology Recent Labs  Lab 12/25/21 1519 12/25/21 1527  WBC 7.6  --   RBC 6.31*  --   HGB 17.8* 19.4*  HCT 54.1* 57.0*  MCV 85.7  --   MCH 28.2  --   MCHC 32.9  --   RDW 14.7  --   PLT 252  --    Thyroid No results for input(s): "TSH", "FREET4" in the last 168 hours.   BNP Recent Labs  Lab 12/25/21 1537  BNP 588.7*    DDimer No results for input(s): "DDIMER" in the last 168 hours.   Radiology/Studies:  CT Angio Chest PE W and/or Wo Contrast  Result Date: 12/25/2021 CLINICAL DATA:  Sudden onset slurred speech and dizziness 1 hour ago, short of breath EXAM: CT ANGIOGRAPHY CHEST WITH CONTRAST TECHNIQUE: Multidetector CT imaging of the chest was performed using the standard protocol during bolus administration of intravenous contrast. Multiplanar CT image reconstructions and MIPs were obtained to evaluate the vascular anatomy. RADIATION DOSE REDUCTION: This exam was performed according to the departmental dose-optimization program which includes automated exposure control, adjustment of the mA and/or kV according to patient size and/or use of iterative reconstruction technique. CONTRAST:  76m OMNIPAQUE IOHEXOL 350 MG/ML SOLN COMPARISON:  12/25/2021, 08/06/2020 FINDINGS: Cardiovascular: This is a technically suboptimal evaluation of the pulmonary vasculature due to poor timing of the contrast bolus. There is sufficient enhancement of the central and proximal segmental pulmonary vasculature to exclude pulmonary emboli within that distribution. The distal segmental and subsegmental branches are incompletely opacified and cannot be evaluated. The heart is unremarkable without pericardial effusion. There is progressive atherosclerosis of the coronary vasculature greatest at the LAD distribution. Normal caliber of the thoracic aorta. Stable atherosclerosis of the aortic arch. Mediastinum/Nodes: No enlarged mediastinal, hilar, or axillary lymph nodes. Thyroid gland, trachea, and esophagus demonstrate no significant findings. Small hiatal hernia. Lungs/Pleura: No acute airspace disease, effusion, or pneumothorax. The central airways are patent. Upper Abdomen: No acute abnormality. Musculoskeletal: No acute or destructive bony lesions. Severe degenerative changes of the  bilateral shoulders. Reconstructed images demonstrate no additional findings. Review of the MIP images confirms the above findings. IMPRESSION: 1. Suboptimal opacification of the pulmonary vasculature. There are no central or proximal segmental pulmonary emboli. Distal segmental and subsegmental branches of the pulmonary vasculature cannot be fully evaluated due to timing of contrast bolus. 2. No acute intrathoracic process. 3. Small hiatal hernia. 4. Aortic Atherosclerosis (ICD10-I70.0). Progressive coronary artery atherosclerosis. Electronically Signed   By: MRanda NgoM.D.   On: 12/25/2021 18:42   DG Chest Portable 1 View  Result Date: 12/25/2021 CLINICAL DATA:  Shortness of breath and dizziness. EXAM: PORTABLE CHEST 1 VIEW COMPARISON:  11/17/2021 FINDINGS: Single-view of the chest was obtained. Both lungs are clear. Heart and mediastinum are within normal limits. Trachea is midline. Negative for a pneumothorax. No acute bone abnormality. IMPRESSION: No active disease. Electronically Signed   By:  Markus Daft M.D.   On: 12/25/2021 16:09   CT HEAD CODE STROKE WO CONTRAST  Result Date: 12/25/2021 CLINICAL DATA:  Code stroke.  Acute neuro deficit EXAM: CT HEAD WITHOUT CONTRAST TECHNIQUE: Contiguous axial images were obtained from the base of the skull through the vertex without intravenous contrast. RADIATION DOSE REDUCTION: This exam was performed according to the departmental dose-optimization program which includes automated exposure control, adjustment of the mA and/or kV according to patient size and/or use of iterative reconstruction technique. COMPARISON:  CT head 05/24/2019 FINDINGS: Brain: Chronic infarct left operculum and middle frontal lobe unchanged from the prior study. Mild hypodensity right frontal white matter also unchanged. Negative for acute infarct, hemorrhage, mass, hydrocephalus Vascular: Negative for hyperdense vessel Skull: Negative Sinuses/Orbits: Paranasal sinuses clear.   Negative orbit Other: None ASPECTS ([place] Stroke Program Early CT Score) - Ganglionic level infarction (caudate, lentiform nuclei, internal capsule, insula, M1-M3 cortex): 7 - Supraganglionic infarction (M4-M6 cortex): 3 Total score (0-10 with 10 being normal): 10 IMPRESSION: 1. No acute abnormality. 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 2. Aspects is 10. 3. Code stroke imaging results were communicated on 12/25/2021 at 3:39 pm to provider Erlinda Hong via [facility] text page Electronically Signed   By: Franchot Gallo M.D.   On: 12/25/2021 15:41     Assessment and Plan:   Paroxysmal atrial fibrillation with rapid ventricular rate Patient presents with A-fib and RVR with relative hypotension now improved with IV fluid resuscitation.  Unclear exactly what is caused his volume depletion.  He does seem to take his medications relatively consistently, as noted by his improved ejection fraction on guideline directed medical therapy on his most recent echo.  No evidence of infection, bleeding, heart failure, ACS as inciting factors to A-fib.  Is also unclear since July how long he has been in A-fib but given his history of heart failure he warrants consideration of restoration of rhythm control.  He may spontaneously convert with correction of his fluid status and at this time I would continue his home antiarrhythmic medications.  He maintains that he has not missed any Eliquis doses so cardioversion could be considered. -Continue home amiodarone 200 mg daily -Continue home carvedilol 3.125 twice daily as blood pressure allows -Continue home digoxin and Eliquis -Would hold his Entresto, torsemide, spironolactone, until resolution of the AKI -Would make n.p.o. tonight in case of consideration of cardioversion tomorrow   Risk Assessment/Risk Scores:          CHA2DS2-VASc Score =     This indicates a  % annual risk of stroke. The patient's score is based upon:          For questions or  updates, please contact [place] Please consult [email or web address] for contact info under    Signed, Silas Flood, MD  12/25/2021 11:37 PM

## 2021-12-25 NOTE — ED Provider Triage Note (Signed)
Emergency Medicine Provider Triage Evaluation Note  Donald [name] , a 61 y.o. male  was evaluated in triage.  Pt arrives complaining of dysarthria and dizziness that started suddenly 1 hour ago.  Patient works here in [employer] and reports history of prior stroke while working today developed sudden slurred speech and dizziness, no numbness or weakness, denies headache or visual changes   Review of Systems  Positive: Dysarthria, dizziness Negative: Headache, numbness, weakness  Physical Exam  Ht 6' (1.829 m)   Wt 130.6 kg   BMI 39.05 kg/m  Gen:   Awake, no distress   Resp:  Normal effort  MSK:   Moves extremities without difficulty  Other:  Patient with slurred speech, cranial nerves III through XII grossly intact, 5/5 strength in bilateral upper and lower extremities and sensation intact and equal in bilateral upper and lower extremities  Medical Decision Making  Medically screening exam initiated at 3:19 PM.  Appropriate orders placed.  Donald [name] was informed that the remainder of the evaluation will be completed by another provider, this initial triage assessment does not replace that evaluation, and the importance of remaining in the ED until their evaluation is complete.  Code stroke activated from triage and patient taken back to CT scan immediately   Dartha Lodge, PA-C 12/25/21 1531

## 2021-12-25 NOTE — ED Provider Notes (Signed)
[facility] EMERGENCY DEPARTMENT Provider Note   CSN: [id number] Arrival date & time: 12/25/21  1517     History  Chief Complaint  Patient presents with   Code Stroke    Donald [name] is a 61 y.o. male.  HPI The patient is a 61 year old male with past medical history of A-fib, CHF, COPD, DM, HTN, CVA presenting as a code stroke for slurred speech.  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  He felt that his speech became acutely slurred.  He was brought back to the scanner and a code stroke was activated.  The patient denies chest pain, recent fevers, vomiting, shortness of breath, abdominal pain, recent changes to medication.    Home Medications Prior to Admission medications   Medication Sig Start Date End Date Taking? Authorizing Provider  albuterol (PROVENTIL) (2.5 MG/3ML) 0.083% nebulizer solution Take 3 mLs (2.5 mg total) by nebulization every 4 (four) hours as needed for wheezing or shortness of breath. 10/07/15  Yes Malvin Johns, MD  albuterol (VENTOLIN HFA) 108 (90 Base) MCG/ACT inhaler Inhale 2 puffs into the lungs every 4 (four) hours as needed for shortness of breath. 07/14/21 01/30/29 Yes Gildardo Pounds, NP  amiodarone (PACERONE) 200 MG tablet TAKE 1 TABLET (200 MG TOTAL) BY MOUTH DAILY (AM) Patient taking differently: Take 200 mg by mouth daily. 09/03/21  Yes Bensimhon, Shaune Pascal, MD  atorvastatin (LIPITOR) 20 MG tablet TAKE 1 TABLET (20 MG TOTAL) BY MOUTH DAILY (AM) Patient taking differently: Take 20 mg by mouth daily. 09/03/21  Yes Bensimhon, Shaune Pascal, MD  carvedilol (COREG) 3.125 MG tablet TAKE 1 TABLET (3.125 MG TOTAL) BY MOUTH 2 (TWO) TIMES DAILY (AM+EVENING) Patient taking differently: Take 3.125 mg by mouth 2 (two) times daily with a meal. 10/28/21  Yes Milford, Maricela Bo, FNP   dapagliflozin propanediol (FARXIGA) 10 MG TABS tablet Take 1 tablet (10 mg total) by mouth daily before breakfast. 12/12/20  Yes Bensimhon, Shaune Pascal, MD  digoxin (LANOXIN) 0.125 MG tablet Take 0.5 tablets (0.0625 mg total) by mouth daily. NEEDS FOLLOW UP APPOINTMENT FOR ANYMORE REFILLS 05/23/21  Yes Milford, Jessica M, FNP  ELIQUIS 5 MG TABS tablet TAKE 1 TABLET (5 MG TOTAL) BY MOUTH 2 (TWO) TIMES DAILY. 09/03/21  Yes Bensimhon, Shaune Pascal, MD  ENTRESTO 49-51 MG TAKE 1 TABLET BY MOUTH 2 (TWO) TIMES DAILY. (AM+EVENING) Patient taking differently: Take 1 tablet by mouth 2 (two) times daily. 10/28/21  Yes Milford, Maricela Bo, FNP  spironolactone (ALDACTONE) 25 MG tablet Take 1 tablet (25 mg total) by mouth daily. NEEDS FOLLOW UP APPOINTMENT FOR ANYMORE REFILLS 05/23/21  Yes Clegg, Amy D, NP  torsemide (DEMADEX) 20 MG tablet Take 1 tablet (20 mg total) by mouth 2 (two) times daily. Please call for office visit [phone] 09/03/21  Yes Bensimhon, Shaune Pascal, MD  traZODone (DESYREL) 100 MG tablet Take 0.5-1 tablets (50-100 mg total) by mouth at bedtime. 07/14/21  Yes Gildardo Pounds, NP  Accu-Chek Softclix Lancets lancets Use as directed 08/15/20   Alma Friendly, MD  Blood Glucose Monitoring Suppl (BLOOD GLUCOSE MONITOR SYSTEM) w/Device KIT USE AS DIRECTED 08/15/20   Alma Friendly, MD  Blood Pressure Monitor DEVI Please provide patient with insurance approved blood pressure monitor ICD 10  I10 07/14/21   Geryl Rankins  W, NP  budesonide-formoterol (SYMBICORT) 80-4.5 MCG/ACT inhaler Inhale 2 puffs into the lungs in the morning and at bedtime. 08/15/20 09/14/20  Alma Friendly, MD  mometasone-formoterol (DULERA) 100-5 MCG/ACT AERO Inhale 1 puff into the lungs daily.    [provider]  OZEMPIC, 0.25 OR 0.5 MG/DOSE, 2 MG/3ML SOPN INJECT 0.5 MG INTO THE SKIN ONCE A WEEK. Patient taking differently: Inject 0.5 mg into the skin once a week. 09/02/21   Gildardo Pounds, NP      Allergies     Patient has no known allergies.    Review of Systems    See HPI  Physical Exam Updated Vital Signs BP (!) 110/98   Pulse (!) 107   Temp 97.8 F (36.6 C)   Resp 19   Ht 6' (1.829 m)   Wt 130.6 kg   SpO2 97%   BMI 39.05 kg/m  Physical Exam Vitals and nursing note reviewed.  Constitutional:      General: He is not in acute distress.    Appearance: He is well-developed. He is obese.  HENT:     Head: Normocephalic and atraumatic.  Eyes:     Conjunctiva/sclera: Conjunctivae normal.  Cardiovascular:     Rate and Rhythm: Normal rate and regular rhythm.     Heart sounds: No murmur heard. Pulmonary:     Effort: Pulmonary effort is normal. No respiratory distress.     Breath sounds: Normal breath sounds.  Abdominal:     Palpations: Abdomen is soft.     Tenderness: There is no abdominal tenderness.  Musculoskeletal:        General: No swelling.     Cervical back: Neck supple.  Skin:    General: Skin is warm and dry.     Capillary Refill: Capillary refill takes less than 2 seconds.  Neurological:     General: No focal deficit present.     Mental Status: He is alert and oriented to person, place, and time.     Cranial Nerves: No cranial nerve deficit.     Sensory: No sensory deficit.     Motor: No weakness.     Coordination: Coordination normal.  Psychiatric:        Mood and Affect: Mood normal.     ED Results / Procedures / Treatments   Labs (all labs ordered are listed, but only abnormal results are displayed) Labs Reviewed  APTT - Abnormal; Notable for the following components:      Result Value   aPTT 38 (*)    All other components within normal limits  CBC - Abnormal; Notable for the following components:   RBC 6.31 (*)    Hemoglobin 17.8 (*)    HCT 54.1 (*)    All other components within normal limits  COMPREHENSIVE METABOLIC PANEL - Abnormal; Notable for the following components:   Glucose, Bld 181 (*)    Creatinine, Ser 1.52 (*)    GFR, Estimated 52  (*)    All other components within normal limits  DIGOXIN LEVEL - Abnormal; Notable for the following components:   Digoxin Level <0.2 (*)    All other components within normal limits  BRAIN NATRIURETIC PEPTIDE - Abnormal; Notable for the following components:   B Natriuretic Peptide 588.7 (*)    All other components within normal limits  I-STAT CHEM 8, ED - Abnormal; Notable for the following components:   Creatinine, Ser 1.50 (*)    Glucose, Bld 186 (*)    Calcium,  Ion 1.07 (*)    Hemoglobin 19.4 (*)    HCT 57.0 (*)    All other components within normal limits  CBG MONITORING, ED - Abnormal; Notable for the following components:   Glucose-Capillary 199 (*)    All other components within normal limits  ETHANOL  PROTIME-INR  DIFFERENTIAL  MAGNESIUM  LACTIC ACID, PLASMA  RAPID URINE DRUG SCREEN, HOSP PERFORMED  URINALYSIS, ROUTINE W REFLEX MICROSCOPIC  LACTIC ACID, PLASMA  HIV ANTIBODY (ROUTINE TESTING W REFLEX)  COMPREHENSIVE METABOLIC PANEL  TROPONIN I (HIGH SENSITIVITY)  TROPONIN I (HIGH SENSITIVITY)    EKG EKG Interpretation  Date/Time:  Thursday December 25 2021 16:10:54 EST Ventricular Rate:  136 PR Interval:    QRS Duration: 110 QT Interval:  352 QTC Calculation: 530 R Axis:   88 Text Interpretation: Atrial fibrillation Borderline repolarization abnormality Prolonged QT interval Confirmed by Lennice Sites ([id number]) on 12/25/2021 4:14:59 PM  Radiology CT Angio Chest PE W and/or Wo Contrast  Result Date: 12/25/2021 CLINICAL DATA:  Sudden onset slurred speech and dizziness 1 hour ago, short of breath EXAM: CT ANGIOGRAPHY CHEST WITH CONTRAST TECHNIQUE: Multidetector CT imaging of the chest was performed using the standard protocol during bolus administration of intravenous contrast. Multiplanar CT image reconstructions and MIPs were obtained to evaluate the vascular anatomy. RADIATION DOSE REDUCTION: This exam was performed according to the departmental  dose-optimization program which includes automated exposure control, adjustment of the mA and/or kV according to patient size and/or use of iterative reconstruction technique. CONTRAST:  15m OMNIPAQUE IOHEXOL 350 MG/ML SOLN COMPARISON:  12/25/2021, 08/06/2020 FINDINGS: Cardiovascular: This is a technically suboptimal evaluation of the pulmonary vasculature due to poor timing of the contrast bolus. There is sufficient enhancement of the central and proximal segmental pulmonary vasculature to exclude pulmonary emboli within that distribution. The distal segmental and subsegmental branches are incompletely opacified and cannot be evaluated. The heart is unremarkable without pericardial effusion. There is progressive atherosclerosis of the coronary vasculature greatest at the LAD distribution. Normal caliber of the thoracic aorta. Stable atherosclerosis of the aortic arch. Mediastinum/Nodes: No enlarged mediastinal, hilar, or axillary lymph nodes. Thyroid gland, trachea, and esophagus demonstrate no significant findings. Small hiatal hernia. Lungs/Pleura: No acute airspace disease, effusion, or pneumothorax. The central airways are patent. Upper Abdomen: No acute abnormality. Musculoskeletal: No acute or destructive bony lesions. Severe degenerative changes of the bilateral shoulders. Reconstructed images demonstrate no additional findings. Review of the MIP images confirms the above findings. IMPRESSION: 1. Suboptimal opacification of the pulmonary vasculature. There are no central or proximal segmental pulmonary emboli. Distal segmental and subsegmental branches of the pulmonary vasculature cannot be fully evaluated due to timing of contrast bolus. 2. No acute intrathoracic process. 3. Small hiatal hernia. 4. Aortic Atherosclerosis (ICD10-I70.0). Progressive coronary artery atherosclerosis. Electronically Signed   By: MRanda NgoM.D.   On: 12/25/2021 18:42   DG Chest Portable 1 View  Result Date:  12/25/2021 CLINICAL DATA:  Shortness of breath and dizziness. EXAM: PORTABLE CHEST 1 VIEW COMPARISON:  11/17/2021 FINDINGS: Single-view of the chest was obtained. Both lungs are clear. Heart and mediastinum are within normal limits. Trachea is midline. Negative for a pneumothorax. No acute bone abnormality. IMPRESSION: No active disease. Electronically Signed   By: AMarkus DaftM.D.   On: 12/25/2021 16:09   CT HEAD CODE STROKE WO CONTRAST  Result Date: 12/25/2021 CLINICAL DATA:  Code stroke.  Acute neuro deficit EXAM: CT HEAD WITHOUT CONTRAST TECHNIQUE: Contiguous axial images were obtained from the base  of the skull through the vertex without intravenous contrast. RADIATION DOSE REDUCTION: This exam was performed according to the departmental dose-optimization program which includes automated exposure control, adjustment of the mA and/or kV according to patient size and/or use of iterative reconstruction technique. COMPARISON:  CT head 05/24/2019 FINDINGS: Brain: Chronic infarct left operculum and middle frontal lobe unchanged from the prior study. Mild hypodensity right frontal white matter also unchanged. Negative for acute infarct, hemorrhage, mass, hydrocephalus Vascular: Negative for hyperdense vessel Skull: Negative Sinuses/Orbits: Paranasal sinuses clear.  Negative orbit Other: None ASPECTS ([place] Stroke Program Early CT Score) - Ganglionic level infarction (caudate, lentiform nuclei, internal capsule, insula, M1-M3 cortex): 7 - Supraganglionic infarction (M4-M6 cortex): 3 Total score (0-10 with 10 being normal): 10 IMPRESSION: 1. No acute abnormality. 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 2. Aspects is 10. 3. Code stroke imaging results were communicated on 12/25/2021 at 3:39 pm to provider Erlinda Hong via [facility] text page Electronically Signed   By: Franchot Gallo M.D.   On: 12/25/2021 15:41    Procedures Procedures    Medications Ordered in ED Medications  sodium chloride flush (NS)  0.9 % injection 3 mL (has no administration in time range)  sodium chloride flush (NS) 0.9 % injection 3 mL (has no administration in time range)  0.9 %  sodium chloride infusion (has no administration in time range)  acetaminophen (TYLENOL) tablet 650 mg (has no administration in time range)    Or  acetaminophen (TYLENOL) suppository 650 mg (has no administration in time range)  apixaban (ELIQUIS) tablet 5 mg (5 mg Oral Given 12/25/21 2122)  carvedilol (COREG) tablet 3.125 mg (3.125 mg Oral Given 12/25/21 2122)  digoxin (LANOXIN) tablet 0.0625 mg (has no administration in time range)  sodium chloride 0.9 % bolus 500 mL (0 mLs Intravenous Stopped 12/25/21 1630)  sodium chloride 0.9 % bolus 1,500 mL (0 mLs Intravenous Stopped 12/25/21 1839)  iohexol (OMNIPAQUE) 350 MG/ML injection 65 mL (65 mLs Intravenous Contrast Given 12/25/21 1829)  0.9 %  sodium chloride infusion ( Intravenous New Bag/Given 12/25/21 2123)    ED Course/ Medical Decision Making/ A&P                           Medical Decision Making  The patient is a 61 year old male with past medical history of A-fib, CHF, COPD, DM, HTN, CVA presenting as a code stroke for slurred speech.  The differential diagnosis considered includes: CVA, TIA, medication side effect, hypoglycemia, metabolic derangement, electrolyte abnormality, infection, arrhythmia, ACS.   The patient was met in the CT scanner by neurology the patient had a normal neurological work-up.  He received a CT head code stroke protocol which showed no acute abnormality.  Based on his neurological exam and negative head CT and acute stroke was determined to be less likely.    On initial evaluation, the patient was alert and oriented.  He was noted to have a blood pressure of 81/65 an EKG was ordered and he was found to be in A-fib with RVR.  Chest x-ray was ordered which did not show any signs of focal consolidation, pneumothorax, or trauma.  The patient was noted to have a  prior history of PEs so a CTA PE study was added which did not reveal any signs of acute thromboembolic disease.   The patient's diagnostic work-up included a i-STAT Chem-8 which revealed a creatinine of 1.50 and glucose of 186; CBC with white blood  cell count of 7.6 and hemoglobin of 17.8; CMP with glucose of 180 and creatinine of 1.52; digoxin level of less than 0.2; INR 1.2, PT 14.7, APTT 38; ethanol less than 10; urinalysis which was pending; UDS which is pending; magnesium 1.9; lactic acid 1.8; HIV which is pending; serial troponins 15 and 13 respectively.  While in the emergency department, the patient received a bolus of IV fluids, with improvement in his heart rate to around 107.  The patient had his medication pill pack by the bedside and it was noticed that his pills were missing from both today and tomorrow.  There is concern that he may have taken an overdose of his medication.   Based on the patient's negative imaging, laboratory work-up, and physical exam I doubt CVA, ACS, PE, or serious infection at this time.  Concern for accidental over ingestion of his medication remains high on the differential.  The patient the patient was admitted to the hospital medicine service for further observation.  Amount and/or Complexity of Data Reviewed Labs: ordered. Decision-making details documented in ED Course. Radiology: ordered and independent interpretation performed. Decision-making details documented in ED Course. ECG/medicine tests: ordered and independent interpretation performed. Decision-making details documented in ED Course. Discussion of management or test interpretation with external provider(s): Neurology, hospital medicine  Risk Prescription drug management. Decision regarding hospitalization.   Patient's presentation is most consistent with acute presentation with potential threat to life or bodily function.         Final Clinical Impression(s) / ED Diagnoses Final  diagnoses:  Hypotension, unspecified hypotension type  Near syncope    Rx / DC Orders ED Discharge Orders          Ordered    Amb referral to AFIB Clinic        12/25/21 2050              Dani Gobble, MD 12/25/21 [place], [place], DO 12/25/21 2340

## 2021-12-26 ENCOUNTER — Ambulatory Visit: Payer: Medicaid Other | Admitting: Podiatry

## 2021-12-26 DIAGNOSIS — I4891 Unspecified atrial fibrillation: Secondary | ICD-10-CM | POA: Diagnosis not present

## 2021-12-26 DIAGNOSIS — D751 Secondary polycythemia: Secondary | ICD-10-CM | POA: Insufficient documentation

## 2021-12-26 LAB — CBG MONITORING, ED: Glucose-Capillary: 103 mg/dL — ABNORMAL HIGH (ref 70–99)

## 2021-12-26 LAB — HIV ANTIBODY (ROUTINE TESTING W REFLEX): HIV Screen 4th Generation wRfx: NONREACTIVE

## 2021-12-26 NOTE — Progress Notes (Signed)
Heart Failure Navigator Progress Note  Assessed for Heart & Vascular TOC clinic readiness.  Patient does not meet criteria due to Advanced Heart Failure patient of Dr. Bensimhon.   Navigator will sign off at this time.    Debar Plate, BSN, RN Heart Failure Nurse Navigator Secure Chat Only   

## 2021-12-26 NOTE — Hospital Course (Addendum)
Mr. No is a 61 y.o. M with pAF on ELiquis, DM, sCHF, hx PE, hx CVA, OSA, polycythemia, HTN and MO who presented with feeling lightheaded and "woozy".    In the ER, found to have Afib with rate 130s, BP 80/60.  Given fluids and Cardiology consulted.

## 2021-12-26 NOTE — ED Notes (Signed)
Pt ambulated in room with no complaints at this time. Then ambulated to bathroom with no symptoms.

## 2021-12-26 NOTE — Discharge Summary (Signed)
Physician Discharge Summary   Patient: Donald [name] MRN: [id number] DOB: 22-Jan-1961  Admit date:     12/25/2021  Discharge date: 12/26/21  Discharge Physician: Edwin Dada   PCP: Gildardo Pounds, NP     Recommendations at discharge:  Follow up with EP Dr. 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 M with pAF on ELiquis, DM, sCHF, hx PE, hx CVA, OSA, polycythemia, HTN and MO who presented with feeling lightheaded and "woozy".    In the ER, found to have Afib with rate 130s, BP 80/60.  Given fluids and Cardiology consulted.    Atrial fibrillation with RVR and hypotension Patient was admitted and given fluids with improvement of BP and HR.  Home meds were resumed.    Overnight, he returned to sinus rhythm and in the morning, was able to ambulate wtihout symptoms, oral intake and mentation were normal and he was starble for discharge home.  Cardiology were consulted who recommended outpatient EP follow up.  Patient related that he had had no preceding illness but that he had noticed his blister pack from [facility] didn't contain the right combination of medicines.  He showed me this blister pack from this week, and it was apparent that of the remaining blisters, some days were missing the [facility] and others had it.    I suspect the patient's blister packs contained the wrong  medication and this precipitated his hypotension and dehydration and rapid Afib.  We have arranged for his medication blister packs to be transferred to another pharmacy, [facility].    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   Consultants: Cardiology Procedures performed: None  Disposition: Home Diet recommendation: Cardiac   DISCHARGE MEDICATION: Allergies as of 12/26/2021   No Known Allergies      Medication List     TAKE these medications    Accu-Chek Guide w/Device Kit USE AS DIRECTED   Accu-Chek Softclix Lancets lancets Use as directed   albuterol (2.5 MG/3ML) 0.083% nebulizer solution Commonly known as: PROVENTIL Take 3 mLs (2.5 mg total) by nebulization every 4 (four) hours as needed for wheezing or shortness of breath.   albuterol 108 (90 Base) MCG/ACT inhaler Commonly known as: VENTOLIN HFA Inhale 2 puffs into the lungs every 4 (four) hours as needed for shortness of breath.   amiodarone 200 MG tablet Commonly known as: PACERONE TAKE 1 TABLET (200 MG TOTAL) BY MOUTH DAILY (AM) What changed: See the new instructions.   atorvastatin 20 MG tablet Commonly known as: LIPITOR TAKE 1 TABLET (20 MG TOTAL) BY MOUTH DAILY (AM) What changed: See the  new instructions.   Blood Pressure Monitor Devi Please provide patient with insurance approved blood pressure monitor ICD 10  I10   carvedilol 3.125 MG tablet Commonly known as: COREG TAKE 1 TABLET (3.125 MG TOTAL) BY MOUTH 2 (TWO) TIMES DAILY (AM+EVENING) What changed: See the new instructions.   digoxin 0.125 MG tablet Commonly known as: LANOXIN Take 0.5 tablets (0.0625 mg total) by mouth daily. NEEDS FOLLOW UP APPOINTMENT FOR ANYMORE REFILLS   Dulera 100-5  MCG/ACT Aero Generic drug: mometasone-formoterol Inhale 1 puff into the lungs daily.   Eliquis 5 MG Tabs tablet Generic drug: apixaban TAKE 1 TABLET (5 MG TOTAL) BY MOUTH 2 (TWO) TIMES DAILY.   Entresto 49-51 MG Generic drug: sacubitril-valsartan TAKE 1 TABLET BY MOUTH 2 (TWO) TIMES DAILY. (AM+EVENING) What changed: See the new instructions.   Farxiga 10 MG Tabs tablet Generic drug: dapagliflozin propanediol TAKE 1 TABLET (10 MG TOTAL) BY MOUTH DAILY BEFORE BREAKFAST (AM) What changed: See the new instructions.   Ozempic (0.25 or 0.5 MG/DOSE) 2 MG/3ML Sopn Generic drug: Semaglutide(0.25 or 0.5MG/DOS) INJECT 0.5 MG INTO THE SKIN ONCE A WEEK.   spironolactone 25 MG tablet Commonly known as: ALDACTONE Take 1 tablet (25 mg total) by mouth daily. NEEDS FOLLOW UP APPOINTMENT FOR ANYMORE REFILLS   Symbicort 80-4.5 MCG/ACT inhaler Generic drug: budesonide-formoterol Inhale 2 puffs into the lungs in the morning and at bedtime.   torsemide 20 MG tablet Commonly known as: DEMADEX Take 1 tablet (20 mg total) by mouth 2 (two) times daily. Please call for office visit [phone]   traZODone 100 MG tablet Commonly known as: DESYREL Take 0.5-1 tablets (50-100 mg total) by mouth at bedtime.        Follow-up Information     Vickie Epley, MD Follow up.   Specialties: Cardiology, Radiology Why: [facility] office will call you to arrange your follow-up appointment. Contact information: [address] [place] [address] [phone]                 Discharge Instructions     Amb referral to AFIB Clinic   Complete by: As directed    Diet - low sodium heart healthy   Complete by: As directed    Discharge instructions   Complete by: As directed    **IMPORTANT DISCHARGE INSTRUCTIONS**   You were admitted for A fib and low blood pressure. You got better here with fluids  I suspect like you said, that this was a mixup in your medicines  Resume  your home medicines as before, to the best of your abilities  Use that Pill finder to try to verify which pills you are taking  Go see Dr. Quentin Ore when they are able to schedule you  The phone number for [facility] is [phone] Call them to confirm the transfer of your prescriptions   Increase activity slowly   Complete by: As directed        Discharge Exam: Filed Weights   12/25/21 1521  Weight: 130.6 kg    General: Pt is alert, awake, not in acute distress Cardiovascular: RRR, nl S1-S2, no murmurs appreciated.   No LE edema.   Respiratory: Normal respiratory rate and rhythm.  CTAB without rales or wheezes. Abdominal: Abdomen soft and non-tender.  No distension or HSM.   Neuro/Psych: Strength symmetric in upper and lower extremities.  Judgment and insight appear normal .   Condition at discharge: good  The results of significant diagnostics from this hospitalization (  including imaging, microbiology, ancillary and laboratory) are listed below for reference.   Imaging Studies: CT Angio Chest PE W and/or Wo Contrast  Result Date: 12/25/2021 CLINICAL DATA:  Sudden onset slurred speech and dizziness 1 hour ago, short of breath EXAM: CT ANGIOGRAPHY CHEST WITH CONTRAST TECHNIQUE: Multidetector CT imaging of the chest was performed using the standard protocol during bolus administration of intravenous contrast. Multiplanar CT image reconstructions and MIPs were obtained to evaluate the vascular anatomy. RADIATION DOSE REDUCTION: This exam was performed according to the departmental dose-optimization program which includes automated exposure control, adjustment of the mA and/or kV according to patient size and/or use of iterative reconstruction technique. CONTRAST:  73m OMNIPAQUE IOHEXOL 350 MG/ML SOLN COMPARISON:  12/25/2021, 08/06/2020 FINDINGS: Cardiovascular: This is a technically suboptimal evaluation of the pulmonary vasculature due to poor timing of the contrast bolus.  There is sufficient enhancement of the central and proximal segmental pulmonary vasculature to exclude pulmonary emboli within that distribution. The distal segmental and subsegmental branches are incompletely opacified and cannot be evaluated. The heart is unremarkable without pericardial effusion. There is progressive atherosclerosis of the coronary vasculature greatest at the LAD distribution. Normal caliber of the thoracic aorta. Stable atherosclerosis of the aortic arch. Mediastinum/Nodes: No enlarged mediastinal, hilar, or axillary lymph nodes. Thyroid gland, trachea, and esophagus demonstrate no significant findings. Small hiatal hernia. Lungs/Pleura: No acute airspace disease, effusion, or pneumothorax. The central airways are patent. Upper Abdomen: No acute abnormality. Musculoskeletal: No acute or destructive bony lesions. Severe degenerative changes of the bilateral shoulders. Reconstructed images demonstrate no additional findings. Review of the MIP images confirms the above findings. IMPRESSION: 1. Suboptimal opacification of the pulmonary vasculature. There are no central or proximal segmental pulmonary emboli. Distal segmental and subsegmental branches of the pulmonary vasculature cannot be fully evaluated due to timing of contrast bolus. 2. No acute intrathoracic process. 3. Small hiatal hernia. 4. Aortic Atherosclerosis (ICD10-I70.0). Progressive coronary artery atherosclerosis. Electronically Signed   By: MRanda NgoM.D.   On: 12/25/2021 18:42   DG Chest Portable 1 View  Result Date: 12/25/2021 CLINICAL DATA:  Shortness of breath and dizziness. EXAM: PORTABLE CHEST 1 VIEW COMPARISON:  11/17/2021 FINDINGS: Single-view of the chest was obtained. Both lungs are clear. Heart and mediastinum are within normal limits. Trachea is midline. Negative for a pneumothorax. No acute bone abnormality. IMPRESSION: No active disease. Electronically Signed   By: AMarkus DaftM.D.   On: 12/25/2021 16:09   CT  HEAD CODE STROKE WO CONTRAST  Result Date: 12/25/2021 CLINICAL DATA:  Code stroke.  Acute neuro deficit EXAM: CT HEAD WITHOUT CONTRAST TECHNIQUE: Contiguous axial images were obtained from the base of the skull through the vertex without intravenous contrast. RADIATION DOSE REDUCTION: This exam was performed according to the departmental dose-optimization program which includes automated exposure control, adjustment of the mA and/or kV according to patient size and/or use of iterative reconstruction technique. COMPARISON:  CT head 05/24/2019 FINDINGS: Brain: Chronic infarct left operculum and middle frontal lobe unchanged from the prior study. Mild hypodensity right frontal white matter also unchanged. Negative for acute infarct, hemorrhage, mass, hydrocephalus Vascular: Negative for hyperdense vessel Skull: Negative Sinuses/Orbits: Paranasal sinuses clear.  Negative orbit Other: None ASPECTS ([place]Stroke Program Early CT Score) - Ganglionic level infarction (caudate, lentiform nuclei, internal capsule, insula, M1-M3 cortex): 7 - Supraganglionic infarction (M4-M6 cortex): 3 Total score (0-10 with 10 being normal): 10 IMPRESSION: 1. No acute abnormality. 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 2. Aspects is  10. 3. Code stroke imaging results were communicated on 12/25/2021 at 3:39 pm to provider Erlinda Hong via [facility] text page Electronically Signed   By: Franchot Gallo M.D.   On: 12/25/2021 15:41    Microbiology: Results for orders placed or performed during the hospital encounter of 08/06/20  Resp Panel by RT-PCR (Flu A&B, Covid) Nasopharyngeal Swab     Status: None   Collection Time: 08/06/20 11:55 AM   Specimen: Nasopharyngeal Swab; Nasopharyngeal(NP) swabs in vial transport medium  Result Value Ref Range Status   SARS Coronavirus 2 by RT PCR NEGATIVE NEGATIVE Final    Comment: (NOTE) SARS-CoV-2 target nucleic acids are NOT DETECTED.  The SARS-CoV-2 RNA is generally detectable in upper  respiratory specimens during the acute phase of infection. The lowest concentration of SARS-CoV-2 viral copies this assay can detect is 138 copies/mL. A negative result does not preclude SARS-Cov-2 infection and should not be used as the sole basis for treatment or other patient management decisions. A negative result may occur with  improper specimen collection/handling, submission of specimen other than nasopharyngeal swab, presence of viral mutation(s) within the areas targeted by this assay, and inadequate number of viral copies(<138 copies/mL). A negative result must be combined with clinical observations, patient history, and epidemiological information. The expected result is Negative.  Fact Sheet for Patients:  EntrepreneurPulse.com.au  Fact Sheet for Healthcare Providers:  IncredibleEmployment.be  This test is no t yet approved or cleared by the [place] FDA and  has been authorized for detection and/or diagnosis of SARS-CoV-2 by FDA under an Emergency Use Authorization (EUA). This EUA will remain  in effect (meaning this test can be used) for the duration of the COVID-19 declaration under Section 564(b)(1) of the Act, 21 U.S.C.section 360bbb-3(b)(1), unless the authorization is terminated  or revoked sooner.       Influenza A by PCR NEGATIVE NEGATIVE Final   Influenza B by PCR NEGATIVE NEGATIVE Final    Comment: (NOTE) The Xpert Xpress SARS-CoV-2/FLU/RSV plus assay is intended as an aid in the diagnosis of influenza from Nasopharyngeal swab specimens and should not be used as a sole basis for treatment. Nasal washings and aspirates are unacceptable for Xpert Xpress SARS-CoV-2/FLU/RSV testing.  Fact Sheet for Patients: EntrepreneurPulse.com.au  Fact Sheet for Healthcare Providers: IncredibleEmployment.be  This test is not yet approved or cleared by the [place] FDA and has been  authorized for detection and/or diagnosis of SARS-CoV-2 by FDA under an Emergency Use Authorization (EUA). This EUA will remain in effect (meaning this test can be used) for the duration of the COVID-19 declaration under Section 564(b)(1) of the Act, 21 U.S.C. section 360bbb-3(b)(1), unless the authorization is terminated or revoked.  Performed at [facility], [place] [address] [place], [place] [address]   Culture, blood (Routine X 2) w Reflex to ID Panel     Status: None   Collection Time: 08/06/20 11:58 AM   Specimen: BLOOD  Result Value Ref Range Status   Specimen Description BLOOD RIGHT ANTECUBITAL  Final   Special Requests   Final    BOTTLES DRAWN AEROBIC AND ANAEROBIC Blood Culture adequate volume   Culture   Final    NO GROWTH 5 DAYS Performed at [facility], [place] [address], [place], [place] [address]    Report Status 08/11/2020 FINAL  Final  Culture, blood (Routine X 2) w Reflex to ID Panel     Status: None   Collection Time: 08/06/20 11:58 AM   Specimen:  BLOOD  Result Value Ref Range Status   Specimen Description BLOOD LEFT ANTECUBITAL  Final   Special Requests   Final    BOTTLES DRAWN AEROBIC AND ANAEROBIC Blood Culture adequate volume   Culture   Final    NO GROWTH 5 DAYS Performed at [facility], [address] [place], [place] [address]    Report Status 08/11/2020 FINAL  Final  MRSA Next Gen by PCR, Nasal     Status: None   Collection Time: 08/13/20 12:40 PM   Specimen: Nasal Mucosa; Nasal Swab  Result Value Ref Range Status   MRSA by PCR Next Gen NOT DETECTED NOT DETECTED Final    Comment: (NOTE) The GeneXpert MRSA Assay (FDA approved for NASAL specimens only), is one component of a comprehensive MRSA colonization surveillance program. It is not intended to diagnose MRSA infection nor to guide or monitor treatment for MRSA infections. Test performance is not FDA approved in patients less than 59 years old. Performed at [facility], [place] [address], [place], [place] [address]     Labs: CBC: Recent Labs  Lab 12/25/21 1519 12/25/21 1527  WBC 7.6  --   NEUTROABS 5.4  --   HGB 17.8* 19.4*  HCT 54.1* 57.0*  MCV 85.7  --   PLT 252  --    Basic Metabolic Panel: Recent Labs  Lab 12/25/21 1519 12/25/21 1527 12/25/21 1537  NA 139 140  --   K 3.9 3.8  --   CL 99 99  --   CO2 28  --   --   GLUCOSE 181* 186*  --   BUN 13 15  --   CREATININE 1.52* 1.50*  --   CALCIUM 9.3  --   --   MG  --   --  1.9   Liver Function Tests: Recent Labs  Lab 12/25/21 1519  AST 21  ALT 16  ALKPHOS 77  BILITOT 0.9  PROT 7.6  ALBUMIN 4.1   CBG: Recent Labs  Lab 12/25/21 1520 12/26/21 1008  GLUCAP 199* 103*    Discharge time spent: approximately 35 minutes spent on discharge counseling, evaluation of patient on day of discharge, and coordination of discharge planning with nursing, social work, pharmacy and case management  Signed: Edwin Dada, MD [facility] 12/26/2021

## 2021-12-26 NOTE — Progress Notes (Signed)
Rounding Note    Patient Name: Donald [name] Date of Encounter: 12/26/2021  [place] Cardiologist: Quentin Ore   Subjective    61 yo with hx of PAF , morbid obesity  OSA   Presented with dizziness, weakness Volume depletion  Had not had anything to eat yesterday , not much to drink    Has converted to sinus rhythm this am Will give him a diet    Inpatient Medications    Scheduled Meds:  amiodarone  200 mg Oral Daily   apixaban  5 mg Oral BID   atorvastatin  20 mg Oral Daily   carvedilol  3.125 mg Oral BID WC   digoxin  0.0625 mg Oral Daily   fluticasone furoate-vilanterol  1 puff Inhalation Daily   insulin aspart  0-15 Units Subcutaneous TID WC   sodium chloride flush  3 mL Intravenous Q12H   Continuous Infusions:  sodium chloride     PRN Meds: sodium chloride, acetaminophen **OR** acetaminophen, sodium chloride flush   Vital Signs    Vitals:   12/26/21 0245 12/26/21 0559 12/26/21 0559 12/26/21 0615  BP: 110/75 107/71  106/73  Pulse: 92 70  60  Resp: 14 19  19   Temp:   97.8 F (36.6 C)   TempSrc:   Oral   SpO2: 96% 94%  96%  Weight:      Height:        Intake/Output Summary (Last 24 hours) at 12/26/2021 0847 Last data filed at 12/25/2021 1839 Gross per 24 hour  Intake 2000 ml  Output --  Net 2000 ml      12/25/2021    3:21 PM 07/18/2021    8:10 AM 03/31/2021    9:22 AM  Last 3 Weights  Weight (lbs) 287 lb 14.7 oz 294 lb 9.6 oz 306 lb  Weight (kg) 130.6 kg 133.63 kg 138.801 kg      Telemetry    NSR  - Personally Reviewed  ECG     - Personally Reviewed  Physical Exam   GEN: morbidly obese male,   NAD  Neck: No JVD Cardiac: RRR, no murmurs, rubs, or gallops.  Respiratory: Clear to auscultation bilaterally. GI: Soft, nontender, non-distended  MS: No edema; No deformity. Neuro:  Nonfocal  Psych: Normal affect   Labs    High Sensitivity Troponin:   Recent Labs  Lab 12/25/21 1537 12/25/21 1849  TROPONINIHS 15 13      Chemistry Recent Labs  Lab 12/25/21 1519 12/25/21 1527 12/25/21 1537  NA 139 140  --   K 3.9 3.8  --   CL 99 99  --   CO2 28  --   --   GLUCOSE 181* 186*  --   BUN 13 15  --   CREATININE 1.52* 1.50*  --   CALCIUM 9.3  --   --   MG  --   --  1.9  PROT 7.6  --   --   ALBUMIN 4.1  --   --   AST 21  --   --   ALT 16  --   --   ALKPHOS 77  --   --   BILITOT 0.9  --   --   GFRNONAA 52*  --   --   ANIONGAP 12  --   --     Lipids No results for input(s): "CHOL", "TRIG", "HDL", "LABVLDL", "LDLCALC", "CHOLHDL" in the last 168 hours.  Hematology Recent Labs  Lab 12/25/21 1519  12/25/21 1527  WBC 7.6  --   RBC 6.31*  --   HGB 17.8* 19.4*  HCT 54.1* 57.0*  MCV 85.7  --   MCH 28.2  --   MCHC 32.9  --   RDW 14.7  --   PLT 252  --    Thyroid No results for input(s): "TSH", "FREET4" in the last 168 hours.  BNP Recent Labs  Lab 12/25/21 1537  BNP 588.7*    DDimer No results for input(s): "DDIMER" in the last 168 hours.   Radiology    CT Angio Chest PE W and/or Wo Contrast  Result Date: 12/25/2021 CLINICAL DATA:  Sudden onset slurred speech and dizziness 1 hour ago, short of breath EXAM: CT ANGIOGRAPHY CHEST WITH CONTRAST TECHNIQUE: Multidetector CT imaging of the chest was performed using the standard protocol during bolus administration of intravenous contrast. Multiplanar CT image reconstructions and MIPs were obtained to evaluate the vascular anatomy. RADIATION DOSE REDUCTION: This exam was performed according to the departmental dose-optimization program which includes automated exposure control, adjustment of the mA and/or kV according to patient size and/or use of iterative reconstruction technique. CONTRAST:  18mL OMNIPAQUE IOHEXOL 350 MG/ML SOLN COMPARISON:  12/25/2021, 08/06/2020 FINDINGS: Cardiovascular: This is a technically suboptimal evaluation of the pulmonary vasculature due to poor timing of the contrast bolus. There is sufficient enhancement of the central and  proximal segmental pulmonary vasculature to exclude pulmonary emboli within that distribution. The distal segmental and subsegmental branches are incompletely opacified and cannot be evaluated. The heart is unremarkable without pericardial effusion. There is progressive atherosclerosis of the coronary vasculature greatest at the LAD distribution. Normal caliber of the thoracic aorta. Stable atherosclerosis of the aortic arch. Mediastinum/Nodes: No enlarged mediastinal, hilar, or axillary lymph nodes. Thyroid gland, trachea, and esophagus demonstrate no significant findings. Small hiatal hernia. Lungs/Pleura: No acute airspace disease, effusion, or pneumothorax. The central airways are patent. Upper Abdomen: No acute abnormality. Musculoskeletal: No acute or destructive bony lesions. Severe degenerative changes of the bilateral shoulders. Reconstructed images demonstrate no additional findings. Review of the MIP images confirms the above findings. IMPRESSION: 1. Suboptimal opacification of the pulmonary vasculature. There are no central or proximal segmental pulmonary emboli. Distal segmental and subsegmental branches of the pulmonary vasculature cannot be fully evaluated due to timing of contrast bolus. 2. No acute intrathoracic process. 3. Small hiatal hernia. 4. Aortic Atherosclerosis (ICD10-I70.0). Progressive coronary artery atherosclerosis. Electronically Signed   By: Randa Ngo M.D.   On: 12/25/2021 18:42   DG Chest Portable 1 View  Result Date: 12/25/2021 CLINICAL DATA:  Shortness of breath and dizziness. EXAM: PORTABLE CHEST 1 VIEW COMPARISON:  11/17/2021 FINDINGS: Single-view of the chest was obtained. Both lungs are clear. Heart and mediastinum are within normal limits. Trachea is midline. Negative for a pneumothorax. No acute bone abnormality. IMPRESSION: No active disease. Electronically Signed   By: Markus Daft M.D.   On: 12/25/2021 16:09   CT HEAD CODE STROKE WO CONTRAST  Result Date:  12/25/2021 CLINICAL DATA:  Code stroke.  Acute neuro deficit EXAM: CT HEAD WITHOUT CONTRAST TECHNIQUE: Contiguous axial images were obtained from the base of the skull through the vertex without intravenous contrast. RADIATION DOSE REDUCTION: This exam was performed according to the departmental dose-optimization program which includes automated exposure control, adjustment of the mA and/or kV according to patient size and/or use of iterative reconstruction technique. COMPARISON:  CT head 05/24/2019 FINDINGS: Brain: Chronic infarct left operculum and middle frontal lobe unchanged from the prior  study. Mild hypodensity right frontal white matter also unchanged. Negative for acute infarct, hemorrhage, mass, hydrocephalus Vascular: Negative for hyperdense vessel Skull: Negative Sinuses/Orbits: Paranasal sinuses clear.  Negative orbit Other: None ASPECTS ([place] Stroke Program Early CT Score) - Ganglionic level infarction (caudate, lentiform nuclei, internal capsule, insula, M1-M3 cortex): 7 - Supraganglionic infarction (M4-M6 cortex): 3 Total score (0-10 with 10 being normal): 10 IMPRESSION: 1. No acute abnormality. 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 2. Aspects is 10. 3. Code stroke imaging results were communicated on 12/25/2021 at 3:39 pm to provider Roda Shutters via [facility] text page Electronically Signed   By: Marlan Palau M.D.   On: 12/25/2021 15:41    Cardiac Studies     Patient Profile     61 y.o. male   Assessment & Plan      PAF :  has converted to sinus rhythm.  Feels better .  Will feed him carb modified diet  Cont current meds I think he can be discharged today   He will follow up with Dr. Lalla Brothers   2.  Possible OSA :   Needs a sleep study if not already done   3. Possible DM :   will check HbA1c.    Would benefit from better diet, weight loss  Is on Farxiga at home     [facility] will sign off.   Medication Recommendations:   cont home meds  Other  recommendations (labs, testing, etc):   Follow up as an outpatient:  with Dr. Lalla Brothers  or APP    For questions or updates, please contact [facility] Please consult [email or web address] for contact info under        Signed, Kristeen Miss, MD  12/26/2021, 8:47 AM

## 2021-12-26 NOTE — Assessment & Plan Note (Addendum)
BMI 39 and HTN, OSA, DM

## 2021-12-26 NOTE — Assessment & Plan Note (Signed)
Prior EF 20-25%, more recently improved to 55-60% on GDMT

## 2021-12-26 NOTE — Progress Notes (Addendum)
I have sent a message to our office's EP scheduling team requesting a follow-up appointment, and our office will call the patient with this information.

## 2021-12-26 NOTE — ED Notes (Signed)
Pt medications provided to pt. Pt refused to take medications originally pulled from pyxis. "Your gloves are dirty from scanning the medications." Had this writer get a new set of medications to provide patient with.

## 2021-12-26 NOTE — ED Notes (Signed)
This RN noted pt to have R side facial droop and slurred speech, unsure of pts baseline. Wickline MD to bedside to evaluate pt. No other focal deficits, grips equal, equal sensation, denies numbness and tingling. Per Bebe Shaggy MD this is pts baseline, no need for repeat head CT. NAD noted at this time

## 2021-12-27 LAB — HEMOGLOBIN A1C
Hgb A1c MFr Bld: 6.8 % — ABNORMAL HIGH (ref 4.8–5.6)
Mean Plasma Glucose: 148 mg/dL

## 2021-12-28 ENCOUNTER — Encounter: Payer: Self-pay | Admitting: Nurse Practitioner

## 2021-12-30 ENCOUNTER — Other Ambulatory Visit: Payer: Self-pay | Admitting: Nurse Practitioner

## 2021-12-30 DIAGNOSIS — I5043 Acute on chronic combined systolic (congestive) and diastolic (congestive) heart failure: Secondary | ICD-10-CM

## 2021-12-30 DIAGNOSIS — I4891 Unspecified atrial fibrillation: Secondary | ICD-10-CM

## 2021-12-30 DIAGNOSIS — E1165 Type 2 diabetes mellitus with hyperglycemia: Secondary | ICD-10-CM

## 2021-12-30 NOTE — Telephone Encounter (Signed)
Requested medication (s) are due for refill today: Yes  Requested medication (s) are on the active medication list: Yes  Last refill:  Various dates  Future visit scheduled: No  Notes to clinic:  Unable to refill per protocol, last refill by another provider.      Requested Prescriptions  Pending Prescriptions Disp Refills   albuterol (VENTOLIN HFA) 108 (90 Base) MCG/ACT inhaler 18 g 1    Sig: Inhale 2 puffs into the lungs every 4 (four) hours as needed for shortness of breath.     Pulmonology:  Beta Agonists 2 Passed - 12/30/2021  5:16 PM      Passed - Last BP in normal range    BP Readings from Last 1 Encounters:  12/26/21 115/72         Passed - Last Heart Rate in normal range    Pulse Readings from Last 1 Encounters:  12/26/21 81         Passed - Valid encounter within last 12 months    Recent Outpatient Visits           7 months ago Type 2 diabetes mellitus with hyperglycemia, without long-term current use of insulin ([place])   [place], [place] W, NP   9 months ago Type 2 diabetes mellitus with hyperglycemia, without long-term current use of insulin [facility])   [place], Vernia Buff, NP   1 year ago Encounter to establish care   [place], Vernia Buff, NP       Future Appointments             In 2 days Jacqualyn Posey, Bonna Gains, DPM [facility] [place] at [place], [employer]   In 6 days Vickie Epley, MD [place]. [facility], [facility]             dapagliflozin propanediol (FARXIGA) 10 MG TABS tablet 30 tablet 0     Endocrinology:  Diabetes - SGLT2 Inhibitors Failed - 12/30/2021  5:16 PM      Failed - Cr in normal range and within 360 days    Creatinine, Ser  Date Value Ref Range Status  12/25/2021 1.50 (H) 0.61 - 1.24 mg/dL Final         Failed - eGFR in normal range and within 360  days    GFR calc Af Amer  Date Value Ref Range Status  05/24/2019 >60 >60 mL/min Final   GFR, Estimated  Date Value Ref Range Status  12/25/2021 52 (L) >60 mL/min Final    Comment:    (NOTE) Calculated using the CKD-EPI Creatinine Equation (2021)    eGFR  Date Value Ref Range Status  07/18/2021 76 >59 mL/min/1.73 Final         Failed - Valid encounter within last 6 months    Recent Outpatient Visits           7 months ago Type 2 diabetes mellitus with hyperglycemia, without long-term current use of insulin [facility])   [place], Vernia Buff, NP   9 months ago Type 2 diabetes mellitus with hyperglycemia, without long-term current use of insulin [facility])   [place], Vernia Buff, NP   1 year ago Encounter to establish care   [place], Vernia Buff, NP  Future Appointments             In 2 days Jacqualyn Posey, Bonna Gains, DPM [facility] [place] at [place], [place]   In [place] days Vickie Epley, MD [facility] A Dept Of [place]. [facility], [facility]            Passed - HBA1C is between 0 and 7.9 and within 180 days    Hgb A1c MFr Bld  Date Value Ref Range Status  12/25/2021 6.8 (H) 4.8 - 5.6 % Final    Comment:    (NOTE)         Prediabetes: 5.7 - 6.4         Diabetes: >6.4         Glycemic control for adults with diabetes: <7.0           sacubitril-valsartan (ENTRESTO) 49-51 MG 60 tablet 0     Off-Protocol Failed - 12/30/2021  5:16 PM      Failed - Medication not assigned to a protocol, review manually.      Passed - Valid encounter within last 12 months    Recent Outpatient Visits           7 months ago Type 2 diabetes mellitus with hyperglycemia, without long-term current use of insulin [facility])   [place], [place] W, NP   9 months ago Type 2 diabetes mellitus with  hyperglycemia, without long-term current use of insulin [facility])   [place], Vernia Buff, NP   1 year ago Encounter to establish care   [place], Vernia Buff, NP       Future Appointments             In 2 days Jacqualyn Posey, Bonna Gains, DPM [facility] [place] at [place], [place]   In 6 days Vickie Epley, MD [place]. [facility], [facility]             carvedilol (COREG) 3.125 MG tablet 60 tablet 0     Cardiovascular: Beta Blockers 3 Failed - 12/30/2021  5:16 PM      Failed - Cr in normal range and within 360 days    Creatinine, Ser  Date Value Ref Range Status  12/25/2021 1.50 (H) 0.61 - 1.24 mg/dL Final         Failed - Valid encounter within last 6 months    Recent Outpatient Visits           7 months ago Type 2 diabetes mellitus with hyperglycemia, without long-term current use of insulin [facility])   [place], [place] W, NP   9 months ago Type 2 diabetes mellitus with hyperglycemia, without long-term current use of insulin [facility])   [place], Vernia Buff, NP   1 year ago Encounter to establish care   [place], Vernia Buff, NP       Future Appointments             In 2 days Jacqualyn Posey, Bonna Gains, DPM [facility] [place] at [place], [employer]   In 6 days Vickie Epley, MD [place]. [facility], [facility]  Passed - AST in normal range and within 360 days    AST  Date Value Ref Range Status  12/25/2021 21 15 - 41 U/L Final         Passed - ALT in normal range and within 360 days    ALT  Date Value Ref Range Status  12/25/2021 16 0 - 44 U/L Final         Passed - Last BP in normal range    BP Readings from Last 1 Encounters:  12/26/21  115/72         Passed - Last Heart Rate in normal range    Pulse Readings from Last 1 Encounters:  12/26/21 81          torsemide (DEMADEX) 20 MG tablet 60 tablet 0    Sig: Take 1 tablet (20 mg total) by mouth 2 (two) times daily. Please call for office visit [phone]     Cardiovascular:  Diuretics - Loop Failed - 12/30/2021  5:16 PM      Failed - Ca in normal range and within 180 days    Calcium  Date Value Ref Range Status  12/25/2021 9.3 8.9 - 10.3 mg/dL Final   Calcium, Ion  Date Value Ref Range Status  12/25/2021 1.07 (L) 1.15 - 1.40 mmol/L Final         Failed - Cr in normal range and within 180 days    Creatinine, Ser  Date Value Ref Range Status  12/25/2021 1.50 (H) 0.61 - 1.24 mg/dL Final         Failed - Valid encounter within last 6 months    Recent Outpatient Visits           7 months ago Type 2 diabetes mellitus with hyperglycemia, without long-term current use of insulin [facility])   [place], Vernia Buff, NP   9 months ago Type 2 diabetes mellitus with hyperglycemia, without long-term current use of insulin [facility])   [place], Vernia Buff, NP   1 year ago Encounter to establish care   [place], Vernia Buff, NP       Future Appointments             In 2 days Jacqualyn Posey, Bonna Gains, DPM [facility] [place] at [place], [employer]   In 6 days Vickie Epley, MD [place]. [facility], [facility]            Passed - K in normal range and within 180 days    Potassium  Date Value Ref Range Status  12/25/2021 3.8 3.5 - 5.1 mmol/L Final         Passed - Na in normal range and within 180 days    Sodium  Date Value Ref Range Status  12/25/2021 140 135 - 145 mmol/L Final  07/18/2021 142 134 - 144 mmol/L Final         Passed - Cl in normal range and within 180 days    Chloride   Date Value Ref Range Status  12/25/2021 99 98 - 111 mmol/L Final         Passed - Mg Level in normal range and within 180 days    Magnesium  Date Value Ref Range Status  12/25/2021 1.9 1.7 - 2.4 mg/dL Final    Comment:    Performed at [facility]  [facility], [place] [address] [place], [place] [address]         Passed - Last BP in normal range    BP Readings from Last 1 Encounters:  12/26/21 115/72          apixaban (ELIQUIS) 5 MG TABS tablet 60 tablet 1    Sig: Take 1 tablet (5 mg total) by mouth 2 (two) times daily.     Hematology:  Anticoagulants - apixaban Failed - 12/30/2021  5:16 PM      Failed - HGB in normal range and within 360 days    Hemoglobin  Date Value Ref Range Status  12/25/2021 19.4 (H) 13.0 - 17.0 g/dL Final  07/18/2021 15.2 13.0 - 17.7 g/dL Final         Failed - HCT in normal range and within 360 days    HCT  Date Value Ref Range Status  12/25/2021 57.0 (H) 39.0 - 52.0 % Final   Hematocrit  Date Value Ref Range Status  07/18/2021 45.7 37.5 - 51.0 % Final         Failed - Cr in normal range and within 360 days    Creatinine, Ser  Date Value Ref Range Status  12/25/2021 1.50 (H) 0.61 - 1.24 mg/dL Final         Passed - PLT in normal range and within 360 days    Platelets  Date Value Ref Range Status  12/25/2021 252 150 - 400 K/uL Final  07/18/2021 250 150 - 450 x10E3/uL Final         Passed - AST in normal range and within 360 days    AST  Date Value Ref Range Status  12/25/2021 21 15 - 41 U/L Final         Passed - ALT in normal range and within 360 days    ALT  Date Value Ref Range Status  12/25/2021 16 0 - 44 U/L Final         Passed - Valid encounter within last 12 months    Recent Outpatient Visits           7 months ago Type 2 diabetes mellitus with hyperglycemia, without long-term current use of insulin ([place])   [place], [place] W, NP   9 months ago Type 2 diabetes mellitus with  hyperglycemia, without long-term current use of insulin [facility])   [place], Vernia Buff, NP   1 year ago Encounter to establish care   [place], Vernia Buff, NP       Future Appointments             In 2 days Jacqualyn Posey, Bonna Gains, DPM [facility] [place] at [place], [place]   In 6 days Vickie Epley, MD [facility] A Dept Of [place]. [facility], [facility]             atorvastatin (LIPITOR) 20 MG tablet 30 tablet 1     Cardiovascular:  Antilipid - Statins Failed - 12/30/2021  5:16 PM      Failed - Lipid Panel in normal range within the last 12 months    Cholesterol  Date Value Ref Range Status  03/08/2020 86 0 - 200 mg/dL Final   LDL Cholesterol  Date Value Ref Range Status  03/08/2020 56 0 - 99 mg/dL Final    Comment:  Total Cholesterol/HDL:CHD Risk Coronary Heart Disease Risk Table                     Men   Women  1/2 Average Risk   3.4   3.3  Average Risk       5.0   4.4  2 X Average Risk   9.6   7.1  3 X Average Risk  23.4   11.0        Use the calculated Patient Ratio above and the CHD Risk Table to determine the patient's CHD Risk.        ATP III CLASSIFICATION (LDL):  <100     mg/dL   Optimal  100-129  mg/dL   Near or Above                    Optimal  130-159  mg/dL   Borderline  160-189  mg/dL   High  >190     mg/dL   Very High Performed at [place] [address], [place], [place] [address]    HDL  Date Value Ref Range Status  03/08/2020 22 (L) >40 mg/dL Final   Triglycerides  Date Value Ref Range Status  03/08/2020 40 <150 mg/dL Final         Passed - Patient is not pregnant      Passed - Valid encounter within last 12 months    Recent Outpatient Visits           7 months ago Type 2 diabetes mellitus with hyperglycemia, without long-term current use of insulin [facility])   [place], [place] W, NP   9 months ago Type 2 diabetes mellitus with hyperglycemia, without long-term current use of insulin [facility])   [place], Vernia Buff, NP   1 year ago Encounter to establish care   [place], Vernia Buff, NP       Future Appointments             In 2 days Jacqualyn Posey, Bonna Gains, DPM [facility] [place] at [place], [employer]   In 6 days Vickie Epley, MD [place]. [employer], [facility]             amiodarone (PACERONE) 200 MG tablet 90 tablet 3     Not Delegated - Cardiovascular: Antiarrhythmic Agents - amiodarone Failed - 12/30/2021  5:16 PM      Failed - This refill cannot be delegated      Failed - Manual Review: Eye exam recommended every 12 months      Failed - Valid encounter within last 6 months    Recent Outpatient Visits           7 months ago Type 2 diabetes mellitus with hyperglycemia, without long-term current use of insulin [facility])   [place], [place] W, NP   9 months ago Type 2 diabetes mellitus with hyperglycemia, without long-term current use of insulin [facility])   [place], Vernia Buff, NP   1 year ago Encounter to establish care   [place], Zelda W, NP       Future Appointments             In 2 days Trula Slade,  DPM [facility] [place] at [place], [place]   In [place] days Vickie Epley, MD [facility] A Dept Of [place]. [facility], [facility]            Passed - TSH in normal range and within 360 days    TSH  Date Value Ref Range Status  07/18/2021 0.517 0.450 - 4.500 uIU/mL Final         Passed - Mg Level in normal range and within 360 days    Magnesium  Date Value Ref Range Status  12/25/2021 1.9 1.7 - 2.4 mg/dL  Final    Comment:    Performed at [facility], [place] [address], [place], [place] [address]         Passed - K in normal range and within 180 days    Potassium  Date Value Ref Range Status  12/25/2021 3.8 3.5 - 5.1 mmol/L Final         Passed - AST in normal range and within 180 days    AST  Date Value Ref Range Status  12/25/2021 21 15 - 41 U/L Final         Passed - ALT in normal range and within 180 days    ALT  Date Value Ref Range Status  12/25/2021 16 0 - 44 U/L Final         Passed - Patient had ECG in the last 180 days      Passed - Patient is not pregnant      Passed - Last BP in normal range    BP Readings from Last 1 Encounters:  12/26/21 115/72         Passed - Last Heart Rate in normal range    Pulse Readings from Last 1 Encounters:  12/26/21 81         Passed - Patient had chest x-ray within the last 6 months       albuterol (PROVENTIL) (2.5 MG/3ML) 0.083% nebulizer solution      Sig: Take 3 mLs (2.5 mg total) by nebulization every 4 (four) hours as needed for wheezing or shortness of breath.     Pulmonology:  Beta Agonists 2 Passed - 12/30/2021  5:16 PM      Passed - Last BP in normal range    BP Readings from Last 1 Encounters:  12/26/21 115/72         Passed - Last Heart Rate in normal range    Pulse Readings from Last 1 Encounters:  12/26/21 81         Passed - Valid encounter within last 12 months    Recent Outpatient Visits           7 months ago Type 2 diabetes mellitus with hyperglycemia, without long-term current use of insulin ([place])   [place], [place] W, NP   9 months ago Type 2 diabetes mellitus with hyperglycemia, without long-term current use of insulin [facility])   [place], Vernia Buff, NP   1 year ago Encounter to establish care   [place], Vernia Buff, NP       Future Appointments             In 2 days  Jacqualyn Posey, Bonna Gains, DPM [facility] [place] at [place], [place]   In 6 days Vickie Epley, MD [place]. [facility], [facility]             Semaglutide,0.25 or 0.5MG/DOS, (OZEMPIC, 0.25 OR 0.5 MG/DOSE,) 2 MG/3ML SOPN 3 mL 1    Sig: Inject 0.5 mg into the skin once a week.     Endocrinology:  Diabetes - GLP-1 Receptor Agonists - semaglutide Failed - 12/30/2021  5:16 PM      Failed - HBA1C in normal range and within 180 days    Hgb A1c MFr Bld  Date Value Ref Range Status  12/25/2021 6.8 (H) 4.8 - 5.6 % Final    Comment:    (NOTE)         Prediabetes: 5.7 - 6.4         Diabetes: >6.4         Glycemic control for adults with diabetes: <7.0          Failed - Cr in normal range and within 360 days    Creatinine, Ser  Date Value Ref Range Status  12/25/2021 1.50 (H) 0.61 - 1.24 mg/dL Final         Failed - Valid encounter within last 6 months    Recent Outpatient Visits           7 months ago Type 2 diabetes mellitus with hyperglycemia, without long-term current use of insulin ([place])   [place], Vernia Buff, NP   9 months ago Type 2 diabetes mellitus with hyperglycemia, without long-term current use of insulin [facility])   [place], Vernia Buff, NP   1 year ago Encounter to establish care   [place], Vernia Buff, NP       Future Appointments             In 2 days Jacqualyn Posey, Bonna Gains, DPM [facility] [place] at [place], [place]   In 6 days Vickie Epley, MD [facility] A Dept Of [place]. [facility], [facility]             traZODone (DESYREL) 100 MG tablet 90 tablet 1    Sig: Take 0.5-1 tablets (50-100 mg total) by mouth at bedtime.     Psychiatry: Antidepressants - Serotonin Modulator Failed - 12/30/2021  5:16 PM      Failed - Valid encounter  within last 6 months    Recent Outpatient Visits           7 months ago Type 2 diabetes mellitus with hyperglycemia, without long-term current use of insulin [facility])   [place], [place] W, NP   9 months ago Type 2 diabetes mellitus with hyperglycemia, without long-term current use of insulin [facility])   [place], Vernia Buff, NP   1 year ago Encounter to establish care   [place], Vernia Buff, NP       Future Appointments             In 2 days Jacqualyn Posey, Bonna Gains, DPM [facility] [place] at [place], [employer]   In 6 days Vickie Epley, MD [place]. [facility], [facility]             spironolactone (ALDACTONE) 25 MG tablet 30 tablet 4  Sig: Take 1 tablet (25 mg total) by mouth daily. NEEDS FOLLOW UP APPOINTMENT FOR ANYMORE REFILLS     Cardiovascular: Diuretics - Aldosterone Antagonist Failed - 12/30/2021  5:16 PM      Failed - Cr in normal range and within 180 days    Creatinine, Ser  Date Value Ref Range Status  12/25/2021 1.50 (H) 0.61 - 1.24 mg/dL Final         Failed - Valid encounter within last 6 months    Recent Outpatient Visits           7 months ago Type 2 diabetes mellitus with hyperglycemia, without long-term current use of insulin [facility])   [place], [place] W, NP   9 months ago Type 2 diabetes mellitus with hyperglycemia, without long-term current use of insulin [facility])   [place], Vernia Buff, NP   1 year ago Encounter to establish care   [place], Vernia Buff, NP       Future Appointments             In 2 days Jacqualyn Posey, Bonna Gains, DPM [facility] [place] at [place], [employer]   In 6 days Vickie Epley, MD [place]. [facility], [facility]            Passed - K in normal range and within 180 days    Potassium  Date Value Ref Range Status  12/25/2021 3.8 3.5 - 5.1 mmol/L Final         Passed - Na in normal range and within 180 days    Sodium  Date Value Ref Range Status  12/25/2021 140 135 - 145 mmol/L Final  07/18/2021 142 134 - 144 mmol/L Final         Passed - eGFR is 30 or above and within 180 days    GFR calc Af Amer  Date Value Ref Range Status  05/24/2019 >60 >60 mL/min Final   GFR, Estimated  Date Value Ref Range Status  12/25/2021 52 (L) >60 mL/min Final    Comment:    (NOTE) Calculated using the CKD-EPI Creatinine Equation (2021)    eGFR  Date Value Ref Range Status  07/18/2021 76 >59 mL/min/1.73 Final         Passed - Last BP in normal range    BP Readings from Last 1 Encounters:  12/26/21 115/72          digoxin (LANOXIN) 0.125 MG tablet 30 tablet 1    Sig: Take 0.5 tablets (0.0625 mg total) by mouth daily. NEEDS FOLLOW UP APPOINTMENT FOR ANYMORE REFILLS     Cardiovascular:  Antiarrhythmic Agents - digoxin Failed - 12/30/2021  5:16 PM      Failed - Cr in normal range and within 180 days    Creatinine, Ser  Date Value Ref Range Status  12/25/2021 1.50 (H) 0.61 - 1.24 mg/dL Final         Failed - Digoxin (serum) in normal range and within 360 days    Digoxin Level  Date Value Ref Range Status  12/25/2021 <0.2 (L) 0.8 - 2.0 ng/mL Final    Comment:    RESULTS CONFIRMED BY MANUAL DILUTION Performed at [place] [address], [place], [place] [address]          Failed - Ca in normal range  and within 360 days    Calcium  Date Value Ref Range Status  12/25/2021 9.3 8.9 - 10.3 mg/dL Final   Calcium, Ion  Date Value Ref Range Status  12/25/2021 1.07 (L) 1.15 - 1.40 mmol/L Final         Failed - Valid encounter within last 6 months    Recent Outpatient Visits           7 months ago Type 2 diabetes mellitus with hyperglycemia,  without long-term current use of insulin [facility])   [place], [place] W, NP   9 months ago Type 2 diabetes mellitus with hyperglycemia, without long-term current use of insulin [facility])   [place], Vernia Buff, NP   1 year ago Encounter to establish care   [place], Vernia Buff, NP       Future Appointments             In 2 days Jacqualyn Posey, Bonna Gains, DPM [facility] [place] at [place], [employer]   In 6 days Vickie Epley, MD [place]. [facility], [facility]            Passed - K in normal range and within 180 days    Potassium  Date Value Ref Range Status  12/25/2021 3.8 3.5 - 5.1 mmol/L Final         Passed - Mg Level in normal range and within 360 days    Magnesium  Date Value Ref Range Status  12/25/2021 1.9 1.7 - 2.4 mg/dL Final    Comment:    Performed at [facility], [place] [address], [place], [place] [address]         Passed - Patient had ECG in the last 360 days      Passed - Last Heart Rate in normal range    Pulse Readings from Last 1 Encounters:  12/26/21 81          mometasone-formoterol (DULERA) 100-5 MCG/ACT AERO 1 each     Sig: Inhale 1 puff into the lungs daily.     Pulmonology:  Combination Products Passed - 12/30/2021  5:16 PM      Passed - Valid encounter within last 12 months    Recent Outpatient Visits           7 months ago Type 2 diabetes mellitus with hyperglycemia, without long-term current use of insulin [facility])   [place], [place] W, NP   9 months ago Type 2 diabetes mellitus with hyperglycemia, without long-term current use of insulin [facility])   [place], Vernia Buff, NP   1 year ago Encounter to establish care   [place], Vernia Buff, NP        Future Appointments             In 2 days Jacqualyn Posey, Bonna Gains, DPM [facility] [place] at [place], [employer]   In 6 days Vickie Epley, MD [place]. [facility], [facility]             budesonide-formoterol (SYMBICORT) 80-4.5 MCG/ACT inhaler 10.2 g 0    Sig: Inhale 2 puffs into the lungs in the morning and at bedtime.     Pulmonology:  Combination Products Passed - 12/30/2021  5:16 PM      Passed - Valid encounter within last 12 months    Recent Outpatient Visits           7 months ago Type 2 diabetes mellitus with hyperglycemia, without long-term current use of insulin [facility])   [place], [place] W, NP   9 months ago Type 2 diabetes mellitus with hyperglycemia, without long-term current use of insulin [facility])   [place], Vernia Buff, NP   1 year ago Encounter to establish care   [place], Vernia Buff, NP       Future Appointments             In 2 days Jacqualyn Posey, Bonna Gains, DPM [facility] [place] at [place], [employer]   In 6 days Vickie Epley, MD [place]. [facility], [facility]             Blood Glucose Monitoring Suppl (BLOOD GLUCOSE MONITOR SYSTEM) w/Device KIT 1 kit 0    Sig: USE AS DIRECTED     Endocrinology: Diabetes - Testing Supplies Passed - 12/30/2021  5:16 PM      Passed - Valid encounter within last 12 months    Recent Outpatient Visits           7 months ago Type 2 diabetes mellitus with hyperglycemia, without long-term current use of insulin [facility])   [place], [place] W, NP   9 months ago Type 2 diabetes mellitus with hyperglycemia, without long-term current use of insulin [facility])   [place], Vernia Buff, NP   1 year ago Encounter to  establish care   [place] [place], Vernia Buff, NP       Future Appointments             In 2 days Jacqualyn Posey, Bonna Gains, DPM [facility] [place], [employer]   In 6 days Vickie Epley, MD [facility], [facility]

## 2021-12-30 NOTE — Telephone Encounter (Signed)
Patient called and advised I will need the names of the medications he needs refills on, he says he will open is MyChart and go over what is needed.

## 2021-12-30 NOTE — Telephone Encounter (Signed)
Pt called stating that he wants all of his medications sent to this new pharmacy listed below.   Medication Refill - Medication:   Has the patient contacted their pharmacy? Yes.   (Agent: If no, request that the patient contact the pharmacy for the refill. If patient does not wish to contact the pharmacy document the reason why and proceed with request.) (Agent: If yes, when and what did the pharmacy advise?)  Preferred Pharmacy (with phone number or street name):   [facility] - [place], [place] - [address]  [address] [place] [address]  Phone: [phone] Fax: [phone]   Has the patient been seen for an appointment in the last year OR does the patient have an upcoming appointment? Yes.    Agent: Please be advised that RX refills may take up to 3 business days. We ask that you follow-up with your pharmacy.

## 2022-01-01 ENCOUNTER — Ambulatory Visit (INDEPENDENT_AMBULATORY_CARE_PROVIDER_SITE_OTHER): Payer: Medicaid Other | Admitting: Podiatry

## 2022-01-01 ENCOUNTER — Other Ambulatory Visit: Payer: Self-pay

## 2022-01-01 DIAGNOSIS — B351 Tinea unguium: Secondary | ICD-10-CM | POA: Diagnosis not present

## 2022-01-01 DIAGNOSIS — E119 Type 2 diabetes mellitus without complications: Secondary | ICD-10-CM | POA: Diagnosis not present

## 2022-01-01 DIAGNOSIS — M79675 Pain in left toe(s): Secondary | ICD-10-CM

## 2022-01-01 DIAGNOSIS — M79674 Pain in right toe(s): Secondary | ICD-10-CM | POA: Diagnosis not present

## 2022-01-01 MED ORDER — AMMONIUM LACTATE 12 % EX LOTN: 1.0000 | TOPICAL_LOTION | CUTANEOUS | 0 refills | Status: AC | PRN

## 2022-01-01 MED ORDER — CICLOPIROX 8 % EX SOLN: Freq: Every day | CUTANEOUS | 2 refills | Status: DC

## 2022-01-01 MED ORDER — TORSEMIDE 20 MG PO TABS: 20.0000 mg | ORAL_TABLET | Freq: Two times a day (BID) | ORAL | 0 refills | Status: DC

## 2022-01-01 NOTE — Progress Notes (Signed)
Subjective:   Patient ID: Donald [name], male   DOB: 61 y.o.   MRN: [id number]   HPI No chief complaint on file.   61 year old male presents the office today for concerns of thick, discolored toenails that is not able to trim himself as well as for dry skin.  No swelling redness or any drainage to the toenail sites.  No open lesions that he reports.  He is on Eliquis.   A1c 6.8 on 12/25/2021  Works as a Dance movement psychotherapist at [employer].   Review of Systems  All other systems reviewed and are negative.   Past Medical History:  Diagnosis Date   Arrhythmia    A fib    Atrial fibrillation (HCC)    CHF (congestive heart failure) (HCC)    COPD (chronic obstructive pulmonary disease) ([place])    Diabetes mellitus without complication ([place])    Hypertension    Sleep apnea    Stroke [facility])     Past Surgical History:  Procedure Laterality Date   ABLATION     CARDIOVERSION N/A 03/12/2020   Procedure: CARDIOVERSION;  Surgeon: Skeet Latch, MD;  Location: [place];  Service: Cardiovascular;  Laterality: N/A;   CARDIOVERSION N/A 08/13/2020   Procedure: CARDIOVERSION;  Surgeon: Jolaine Artist, MD;  Location: [place];  Service: Cardiovascular;  Laterality: N/A;   CARDIOVERSION N/A 09/02/2020   Procedure: CARDIOVERSION;  Surgeon: Elouise Munroe, MD;  Location: [place];  Service: Cardiovascular;  Laterality: N/A;   INCISION AND DRAINAGE PERIRECTAL ABSCESS N/A 10/20/2012   Procedure: IRRIGATION AND DEBRIDEMENT PERIRECTAL ABSCESS;  Surgeon: Imogene Burn. Georgette Dover, MD;  Location: [place];  Service: General;  Laterality: N/A;  Lithotomy   RIGHT/LEFT HEART CATH AND CORONARY ANGIOGRAPHY N/A 08/12/2020   Procedure: RIGHT/LEFT HEART CATH AND CORONARY ANGIOGRAPHY;  Surgeon: Jolaine Artist, MD;  Location: [facility];  Service: Cardiovascular;  Laterality: N/A;   TEE WITHOUT CARDIOVERSION N/A 03/12/2020   Procedure: TRANSESOPHAGEAL ECHOCARDIOGRAM (TEE);  Surgeon: Skeet Latch, MD;  Location: [place];  Service: Cardiovascular;  Laterality: N/A;   TEE WITHOUT CARDIOVERSION N/A 08/13/2020   Procedure: TRANSESOPHAGEAL ECHOCARDIOGRAM (TEE);  Surgeon: Jolaine Artist, MD;  Location: [facility];  Service: Cardiovascular;  Laterality: N/A;     Current Outpatient Medications:    Accu-Chek Softclix Lancets lancets, Use as directed, Disp: 100 each, Rfl: 0   albuterol (PROVENTIL) (2.5 MG/3ML) 0.083% nebulizer solution, Take 3 mLs (2.5 mg total) by nebulization every 4 (four) hours as needed for wheezing or shortness of breath., Disp: 30 vial, Rfl: 0   albuterol (VENTOLIN HFA) 108 (90 Base) MCG/ACT inhaler, Inhale 2 puffs into the lungs every 4 (four) hours as needed for shortness of breath., Disp: 18 g, Rfl: 1   amiodarone (PACERONE) 200 MG tablet, TAKE 1 TABLET (200 MG TOTAL) BY MOUTH DAILY (AM) (Patient taking differently: Take 200 mg by mouth daily.), Disp: 90 tablet, Rfl: 3   atorvastatin (LIPITOR) 20 MG tablet, TAKE 1 TABLET (20 MG TOTAL) BY MOUTH DAILY (AM) (Patient taking differently: Take 20 mg by mouth daily.), Disp: 30 tablet, Rfl: 1   Blood Glucose Monitoring Suppl (BLOOD GLUCOSE MONITOR SYSTEM) w/Device KIT, USE AS DIRECTED, Disp: 1 kit, Rfl: 0   Blood Pressure Monitor DEVI, Please provide patient with insurance approved blood pressure monitor ICD 10  I10, Disp: 1 each, Rfl: 0   budesonide-formoterol (SYMBICORT) 80-4.5 MCG/ACT inhaler, Inhale 2 puffs into the lungs in the morning and at bedtime., Disp: 10.2 g,  Rfl: 0   carvedilol (COREG) 3.125 MG tablet, TAKE 1 TABLET (3.125 MG TOTAL) BY MOUTH 2 (TWO) TIMES DAILY (AM+EVENING), Disp: 60 tablet, Rfl: 0   dapagliflozin propanediol (FARXIGA) 10 MG TABS tablet, TAKE 1 TABLET (10 MG TOTAL) BY MOUTH DAILY BEFORE BREAKFAST (AM), Disp: 30 tablet, Rfl: 0   digoxin (LANOXIN) 0.125 MG tablet, Take 0.5 tablets (0.0625 mg total) by mouth daily. NEEDS FOLLOW UP APPOINTMENT FOR ANYMORE REFILLS, Disp: 30 tablet, Rfl:  1   ELIQUIS 5 MG TABS tablet, TAKE 1 TABLET (5 MG TOTAL) BY MOUTH 2 (TWO) TIMES DAILY., Disp: 60 tablet, Rfl: 1   mometasone-formoterol (DULERA) 100-5 MCG/ACT AERO, Inhale 1 puff into the lungs daily., Disp: , Rfl:    OZEMPIC, 0.25 OR 0.5 MG/DOSE, 2 MG/3ML SOPN, INJECT 0.5 MG INTO THE SKIN ONCE A WEEK. (Patient taking differently: Inject 0.5 mg into the skin once a week.), Disp: 3 mL, Rfl: 1   sacubitril-valsartan (ENTRESTO) 49-51 MG, TAKE 1 TABLET BY MOUTH 2 (TWO) TIMES DAILY. (AM+EVENING), Disp: 60 tablet, Rfl: 0   spironolactone (ALDACTONE) 25 MG tablet, Take 1 tablet (25 mg total) by mouth daily. NEEDS FOLLOW UP APPOINTMENT FOR ANYMORE REFILLS, Disp: 30 tablet, Rfl: 4   torsemide (DEMADEX) 20 MG tablet, Take 1 tablet (20 mg total) by mouth 2 (two) times daily. Please call for office visit [phone], Disp: 60 tablet, Rfl: 0   traZODone (DESYREL) 100 MG tablet, Take 0.5-1 tablets (50-100 mg total) by mouth at bedtime., Disp: 90 tablet, Rfl: 1  No Known Allergies       Objective:  Physical Exam  General: AAO x3, NAD  Dermatological: Nails are hypertrophic, dystrophic, brittle, discolored, elongated 10. No surrounding redness or drainage. Tenderness nails 1-5 bilaterally.  Dry skin present bilaterally with hyperkeratotic tissue present along the hallux, medial first MPJ.  There is no skin fissures or open lesions noted.  Vascular: Dorsalis Pedis artery and Posterior Tibial artery pedal pulses are 2/4 bilateral with immedate capillary fill time. There is no pain with calf compression, swelling, warmth, erythema.   Neruologic: Grossly intact via light touch bilateral.   Musculoskeletal: No gross boney pedal deformities bilateral. No pain, crepitus, or limitation noted with foot and ankle range of motion bilateral. Muscular strength 5/5 in all groups tested bilateral.  Gait: Unassisted, Nonantalgic.        Assessment:   Onychomycosis, on chronic anticoagulation     Plan:   -Treatment options discussed including all alternatives, risks, and complications -Etiology of symptoms were discussed -Nails debrided 10 without complications or bleeding.  Prescribed Penlac for nail fungus.  Discussed oral medications as well. -As a courtesy debrided the calluses and complications of bleeding.  Prescribed AmLactin. -Daily foot inspection -Follow-up in 3 months or sooner if any problems arise. In the meantime, encouraged to call the office with any questions, concerns, change in symptoms.   Celesta Gentile, DPM

## 2022-01-01 NOTE — Patient Instructions (Signed)
Ciclopirox Topical Solution What is this medication? CICLOPIROX (sye kloe PEER ox) treats fungal infections of the nails. It belongs to a group of medications called antifungals. It will not treat infections caused by bacteria or viruses. This medicine may be used for other purposes; ask your health care provider or pharmacist if you have questions. COMMON BRAND NAME(S): Ciclodan Nail Solution, CNL8, Penlac What should I tell my care team before I take this medication? They need to know if you have any of these conditions: Diabetes (high blood sugar) Immune system problems Organ transplant Receiving steroid inhalers, cream, or lotion Seizures Tingling of the fingers or toes or other nerve disorder An unusual or allergic reaction to ciclopirox, other medications, foods, dyes, or preservatives Pregnant or trying to get pregnant Breast-feeding How should I use this medication? This medication is for external use only. Do not take by mouth. Wash your hands before and after use. If you are treating your hands, only wash your hands before use. Do not get it in your eyes. If you do, rinse your eyes with plenty of cool tap water. Use it as directed on the prescription label at the same time every day. Do not use it more often than directed. Use the medication for the full course as directed by your care team, even if you think you are better. Do not stop using it unless your care team tells you to stop it early. Apply a thin film of the medication to the affected area. Talk to your care team about the use of this medication in children. While it may be prescribed for children as young as 12 years for selected conditions, precautions do apply. Overdosage: If you think you have taken too much of this medicine contact a poison control center or emergency room at once. NOTE: This medicine is only for you. Do not share this medicine with others. What if I miss a dose? If you miss a dose, use it as soon as  you can. If it is almost time for your next dose, use only that dose. Do not use double or extra doses. What may interact with this medication? Interactions are not expected. Do not use any other skin products without telling your care team. This list may not describe all possible interactions. Give your health care provider a list of all the medicines, herbs, non-prescription drugs, or dietary supplements you use. Also tell them if you smoke, drink alcohol, or use illegal drugs. Some items may interact with your medicine. What should I watch for while using this medication? Visit your care team for regular checks on your progress. It may be some time before you see the benefit from this medication. Do not use nail polish or other nail cosmetic products on the treated nails. Removal of the unattached, infected nail by your care team is needed with use of this medication. If you have diabetes or numbness in your fingers or toes, talk to your care team about proper nail care. What side effects may I notice from receiving this medication? Side effects that you should report to your care team as soon as possible: Allergic reactions--skin rash, itching, hives, swelling of the face, lips, tongue, or throat Burning, itching, crusting, or peeling of treated skin Side effects that usually do not require medical attention (report to your care team if they continue or are bothersome): Change in nail shape, thickness, or color Mild skin irritation, redness, or dryness This list may not describe all possible side   effects. Call your doctor for medical advice about side effects. You may report side effects to FDA at 1-800-FDA-1088. Where should I keep my medication? Keep out of the reach of children and pets. Store at room temperature between 20 and 25 degrees C (68 and 77 degrees F). This medication is flammable. Avoid exposure to heat, fire, flame, and smoking. Get rid of medications that are no longer needed  or have expired: Take the medication to a medication take-back program. Check with your pharmacy or law enforcement to find a location. If you cannot return the medication, check the label or package insert to see if the medication should be thrown out in the garbage or flushed down the toilet. If you are not sure, ask your care team. If it is safe to put in the trash, take the medication out of the container. Mix the medication with cat litter, dirt, coffee grounds, or other unwanted substance. Seal the mixture in a bag or container. Put it in the trash. NOTE: This sheet is a summary. It may not cover all possible information. If you have questions about this medicine, talk to your doctor, pharmacist, or health care provider.  2023 Elsevier/Gold Standard (2020-12-24 00:00:00) Diabetes Mellitus and Foot Care Diabetes, also called diabetes mellitus, may cause problems with your feet and legs because of poor blood flow (circulation). Poor circulation may make your skin: Become thinner and drier. Break more easily. Heal more slowly. Peel and crack. You may also have nerve damage (neuropathy). This can cause decreased feeling in your legs and feet. This means that you may not notice minor injuries to your feet that could lead to more serious problems. Finding and treating problems early is the best way to prevent future foot problems. How to care for your feet Foot hygiene  Wash your feet daily with warm water and mild soap. Do not use hot water. Then, pat your feet and the areas between your toes until they are fully dry. Do not soak your feet. This can dry your skin. Trim your toenails straight across. Do not dig under them or around the cuticle. File the edges of your nails with an emery board or nail file. Apply a moisturizing lotion or petroleum jelly to the skin on your feet and to dry, brittle toenails. Use lotion that does not contain alcohol and is unscented. Do not apply lotion between your  toes. Shoes and socks Wear clean socks or stockings every day. Make sure they are not too tight. Do not wear knee-high stockings. These may decrease blood flow to your legs. Wear shoes that fit well and have enough cushioning. Always look in your shoes before you put them on to be sure there are no objects inside. To break in new shoes, wear them for just a few hours a day. This prevents injuries on your feet. Wounds, scrapes, corns, and calluses  Check your feet daily for blisters, cuts, bruises, sores, and redness. If you cannot see the bottom of your feet, use a mirror or ask someone for help. Do not cut off corns or calluses or try to remove them with medicine. If you find a minor scrape, cut, or break in the skin on your feet, keep it and the skin around it clean and dry. You may clean these areas with mild soap and water. Do not clean the area with peroxide, alcohol, or iodine. If you have a wound, scrape, corn, or callus on your foot, look at it several times  a day to make sure it is healing and not infected. Check for: Redness, swelling, or pain. Fluid or blood. Warmth. Pus or a bad smell. General tips Do not cross your legs. This may decrease blood flow to your feet. Do not use heating pads or hot water bottles on your feet. They may burn your skin. If you have lost feeling in your feet or legs, you may not know this is happening until it is too late. Protect your feet from hot and cold by wearing shoes, such as at the beach or on hot pavement. Schedule a complete foot exam at least once a year or more often if you have foot problems. Report any cuts, sores, or bruises to your health care provider right away. Where to find more information American Diabetes Association: diabetes.org Association of Diabetes Care & Education Specialists: diabeteseducator.org Contact a health care provider if: You have a condition that increases your risk of infection, and you have any cuts, sores, or  bruises on your feet. You have an injury that is not healing. You have redness on your legs or feet. You feel burning or tingling in your legs or feet. You have pain or cramps in your legs and feet. Your legs or feet are numb. Your feet always feel cold. You have pain around any toenails. Get help right away if: You have a wound, scrape, corn, or callus on your foot and: You have signs of infection. You have a fever. You have a red line going up your leg. This information is not intended to replace advice given to you by your health care provider. Make sure you discuss any questions you have with your health care provider. Document Revised: 07/16/2021 Document Reviewed: 07/16/2021 Elsevier Patient Education  [place].

## 2022-01-04 NOTE — Progress Notes (Deleted)
Electrophysiology Office Follow up Visit Note:    Date:  01/04/2022   ID:  Donald [name], DOB 09/11/1960, MRN [id number]  PCP:  Donald Pounds, NP  [facility] Cardiologist:  None  [facility] Electrophysiologist:  Vickie Epley, MD    Interval History:    Donald [name] is a 61 y.o. male who presents for a follow up visit. They were last seen in clinic 09/24/2020.  At that appointment he was taking amiodarone.  At the appointment we discussed catheter ablation as a mechanism to avoid amiodarone exposure given his young age.  He had had a prior pulmonary embolism and so we decided to wait at least 6 months to allow him to fully recover from the PE before considering a procedure.  In the interim he was to work on weight loss and exercise.  Repeat echo in July of this year shows left ventricular function.  He saw [name] in clinic in June 2023.  At that appointment he was still taking amiodarone 200 mg by mouth once daily.  Repeat ablation was again discussed.     Past Medical History:  Diagnosis Date   Arrhythmia    A fib    Atrial fibrillation (HCC)    CHF (congestive heart failure) (HCC)    COPD (chronic obstructive pulmonary disease) ([place])    Diabetes mellitus without complication ([place])    Hypertension    Sleep apnea    Stroke [facility])     Past Surgical History:  Procedure Laterality Date   ABLATION     CARDIOVERSION N/A 03/12/2020   Procedure: CARDIOVERSION;  Surgeon: Skeet Latch, MD;  Location: [place];  Service: Cardiovascular;  Laterality: N/A;   CARDIOVERSION N/A 08/13/2020   Procedure: CARDIOVERSION;  Surgeon: Jolaine Artist, MD;  Location: [place];  Service: Cardiovascular;  Laterality: N/A;   CARDIOVERSION N/A 09/02/2020   Procedure: CARDIOVERSION;  Surgeon: Elouise Munroe, MD;  Location: [place];  Service: Cardiovascular;  Laterality: N/A;   INCISION AND DRAINAGE PERIRECTAL ABSCESS N/A 10/20/2012   Procedure: IRRIGATION AND  DEBRIDEMENT PERIRECTAL ABSCESS;  Surgeon: Imogene Burn. Georgette Dover, MD;  Location: [place];  Service: General;  Laterality: N/A;  Lithotomy   RIGHT/LEFT HEART CATH AND CORONARY ANGIOGRAPHY N/A 08/12/2020   Procedure: RIGHT/LEFT HEART CATH AND CORONARY ANGIOGRAPHY;  Surgeon: Jolaine Artist, MD;  Location: [place] CV LAB;  Service: Cardiovascular;  Laterality: N/A;   TEE WITHOUT CARDIOVERSION N/A 03/12/2020   Procedure: TRANSESOPHAGEAL ECHOCARDIOGRAM (TEE);  Surgeon: Skeet Latch, MD;  Location: [place];  Service: Cardiovascular;  Laterality: N/A;   TEE WITHOUT CARDIOVERSION N/A 08/13/2020   Procedure: TRANSESOPHAGEAL ECHOCARDIOGRAM (TEE);  Surgeon: Jolaine Artist, MD;  Location: [facility] ENDOSCOPY;  Service: Cardiovascular;  Laterality: N/A;    Current Medications: No outpatient medications have been marked as taking for the 01/05/22 encounter (Appointment) with Vickie Epley, MD.     Allergies:   Patient has no known allergies.   Social History   Socioeconomic History   Marital status: Single    Spouse name: Not on file   Number of children: Not on file   Years of education: Not on file   Highest education level: Not on file  Occupational History   Not on file  Tobacco Use   Smoking status: Former    Packs/day: 0.50    Types: Cigarettes    Quit date: 2018    Years since quitting: 5.9   Smokeless tobacco: Never  Substance and Sexual Activity  Alcohol use: Not Currently   Drug use: Not Currently    Types: Cocaine   Sexual activity: Not Currently  Other Topics Concern   Not on file  Social History Narrative   Not on file   Social Determinants of Health   Financial Resource Strain: Medium Risk (08/14/2020)   Overall Financial Resource Strain (CARDIA)    Difficulty of Paying Living Expenses: Somewhat hard  Food Insecurity: Food Insecurity Present (08/14/2020)   Hunger Vital Sign    Worried About Running Out of Food in the Last Year: Never true    Ran Out of Food  in the Last Year: Sometimes true  Transportation Needs: Unmet Transportation Needs (08/14/2020)   PRAPARE - Transportation    Lack of Transportation (Medical): Yes    Lack of Transportation (Non-Medical): Yes  Physical Activity: Insufficiently Active (11/12/2020)   Exercise Vital Sign    Days of Exercise per Week: 3 days    Minutes of Exercise per Session: 20 min  Stress: No Stress Concern Present (11/12/2020)   [place]    Feeling of Stress : Only a little  Social Connections: Moderately Isolated (09/23/2020)   Social Connection and Isolation Panel [NHANES]    Frequency of Communication with Friends and Family: More than three times a week    Frequency of Social Gatherings with Friends and Family: More than three times a week    Attends Religious Services: 1 to 4 times per year    Active Member of [employer] or Organizations: No    Attends Music therapist: Never    Marital Status: Never married     Family History: The patient's family history includes Breast cancer in his mother; Heart attack (age of onset: 11) in his father; Heart attack (age of onset: 82) in his paternal uncle.  ROS:   Please see the history of present illness.    All other systems reviewed and are negative.  EKGs/Labs/Other Studies Reviewed:    The following studies were reviewed today:   Recent Labs: 07/18/2021: TSH 0.517 12/25/2021: ALT 16; B Natriuretic Peptide 588.7; BUN 15; Creatinine, Ser 1.50; Hemoglobin 19.4; Magnesium 1.9; Platelets 252; Potassium 3.8; Sodium 140  Recent Lipid Panel    Component Value Date/Time   CHOL 86 03/08/2020 0506   TRIG 40 03/08/2020 0506   HDL 22 (L) 03/08/2020 0506   CHOLHDL 3.9 03/08/2020 0506   VLDL 8 03/08/2020 0506   LDLCALC 56 03/08/2020 0506    Physical Exam:    VS:  There were no vitals taken for this visit.    Wt Readings from Last 3 Encounters:  12/25/21 287 lb 14.7 oz (130.6  kg)  07/18/21 294 lb 9.6 oz (133.6 kg)  03/31/21 (!) 306 lb (138.8 kg)     GEN: *** Well nourished, well developed in no acute distress HEENT: Normal NECK: No JVD; No carotid bruits LYMPHATICS: No lymphadenopathy CARDIAC: ***RRR, no murmurs, rubs, gallops RESPIRATORY:  Clear to auscultation without rales, wheezing or rhonchi  ABDOMEN: Soft, non-tender, non-distended MUSCULOSKELETAL:  No edema; No deformity  SKIN: Warm and dry NEUROLOGIC:  Alert and oriented x 3 PSYCHIATRIC:  Normal affect        ASSESSMENT:    No diagnosis found. PLAN:    In order of problems listed above:  #Persistent atrial fibrillation Prior flutter ablation in [facility].  With contributory to his heart failure with reduced ejection fraction.  Maintaining sinus rhythm is allowed his EF  to normalize.  He continues to take amiodarone 200 mg by mouth once daily.  Continue Eliquis, Coreg, amiodarone.  Discussed treatment options today for their AF including antiarrhythmic drug therapy and ablation. Discussed risks, recovery and likelihood of success. Discussed potential need for repeat ablation procedures and antiarrhythmic drugs after an initial ablation. They wish to proceed with scheduling.  Risk, benefits, and alternatives to EP study and radiofrequency ablation for afib were also discussed in detail today. These risks include but are not limited to stroke, bleeding, vascular damage, tamponade, perforation, damage to the esophagus, lungs, and other structures, pulmonary vein stenosis, worsening renal function, and death. The patient understands these risk and wishes to proceed.  We will therefore proceed with catheter ablation at the next available time.  Carto, ICE, anesthesia are requested for the procedure.  Will also obtain CT PV protocol prior to the procedure to exclude LAA thrombus and further evaluate atrial anatomy.  Ablation strategy would be PVI plus posterior wall plus CTI.  #Chronic systolic  heart failure Normalized EF now that he is maintained normal rhythm.  NYHA class II today.  Warm and dry on exam.  Continue heart failure regimen.  #Amiodarone monitoring AST/ALT okay in November 2023.      Medication Adjustments/Labs and Tests Ordered: Current medicines are reviewed at length with the patient today.  Concerns regarding medicines are outlined above.  No orders of the defined types were placed in this encounter.  No orders of the defined types were placed in this encounter.    Signed, Steffanie Dunn, MD, [facility], [facility] 01/04/2022 4:08 PM    Electrophysiology [facility]

## 2022-01-05 ENCOUNTER — Ambulatory Visit: Payer: Medicaid Other | Admitting: Cardiology

## 2022-01-05 DIAGNOSIS — I4819 Other persistent atrial fibrillation: Secondary | ICD-10-CM

## 2022-01-05 DIAGNOSIS — Z79899 Other long term (current) drug therapy: Secondary | ICD-10-CM

## 2022-01-05 DIAGNOSIS — I5022 Chronic systolic (congestive) heart failure: Secondary | ICD-10-CM

## 2022-01-05 NOTE — Progress Notes (Incomplete)
Electrophysiology Office Follow up Visit Note:    Date:  01/05/2022   ID:  Donald [name], DOB 1960/06/05, MRN [id number]  PCP:  Claiborne Rigg, NP  [facility] Cardiologist:  None  [facility] Electrophysiologist:  Lanier Prude, MD    Interval History:    Donald [name] is a 61 y.o. male who presents for a follow up visit. They were last seen in clinic 09/24/2020.  At that appointment he was taking amiodarone.  At the appointment we discussed catheter ablation as a mechanism to avoid amiodarone exposure given his young age.  He had had a prior pulmonary embolism and so we decided to wait at least 6 months to allow him to fully recover from the PE before considering a procedure.  In the interim he was to work on weight loss and exercise.  Repeat echo in July of this year shows left ventricular function.  He saw Mardelle Matte in clinic in June 2023.  At that appointment he was still taking amiodarone 200 mg by mouth once daily.  Repeat ablation was again discussed.  Today,  He denies any palpitations, chest pain, shortness of breath, or peripheral edema. No lightheadedness, headaches, syncope, orthopnea, or PND.  (+)     Past Medical History:  Diagnosis Date   Arrhythmia    A fib    Atrial fibrillation (HCC)    CHF (congestive heart failure) (HCC)    COPD (chronic obstructive pulmonary disease) (HCC)    Diabetes mellitus without complication (HCC)    Hypertension    Sleep apnea    Stroke [facility])     Past Surgical History:  Procedure Laterality Date   ABLATION     CARDIOVERSION N/A 03/12/2020   Procedure: CARDIOVERSION;  Surgeon: Chilton Si, MD;  Location: [facility] ENDOSCOPY;  Service: Cardiovascular;  Laterality: N/A;   CARDIOVERSION N/A 08/13/2020   Procedure: CARDIOVERSION;  Surgeon: Dolores Patty, MD;  Location: [facility] ENDOSCOPY;  Service: Cardiovascular;  Laterality: N/A;   CARDIOVERSION N/A 09/02/2020   Procedure: CARDIOVERSION;  Surgeon: Parke Poisson, MD;   Location: [facility] ENDOSCOPY;  Service: Cardiovascular;  Laterality: N/A;   INCISION AND DRAINAGE PERIRECTAL ABSCESS N/A 10/20/2012   Procedure: IRRIGATION AND DEBRIDEMENT PERIRECTAL ABSCESS;  Surgeon: Wilmon Arms. Corliss Skains, MD;  Location: [facility] OR;  Service: General;  Laterality: N/A;  Lithotomy   RIGHT/LEFT HEART CATH AND CORONARY ANGIOGRAPHY N/A 08/12/2020   Procedure: RIGHT/LEFT HEART CATH AND CORONARY ANGIOGRAPHY;  Surgeon: Dolores Patty, MD;  Location: [facility] INVASIVE CV LAB;  Service: Cardiovascular;  Laterality: N/A;   TEE WITHOUT CARDIOVERSION N/A 03/12/2020   Procedure: TRANSESOPHAGEAL ECHOCARDIOGRAM (TEE);  Surgeon: Chilton Si, MD;  Location: [facility] ENDOSCOPY;  Service: Cardiovascular;  Laterality: N/A;   TEE WITHOUT CARDIOVERSION N/A 08/13/2020   Procedure: TRANSESOPHAGEAL ECHOCARDIOGRAM (TEE);  Surgeon: Dolores Patty, MD;  Location: [facility] ENDOSCOPY;  Service: Cardiovascular;  Laterality: N/A;    Current Medications: No outpatient medications have been marked as taking for the 01/05/22 encounter (Appointment) with Lanier Prude, MD.     Allergies:   Patient has no known allergies.   Social History   Socioeconomic History   Marital status: Single    Spouse name: Not on file   Number of children: Not on file   Years of education: Not on file   Highest education level: Not on file  Occupational History   Not on file  Tobacco Use   Smoking status: Former    Packs/day: 0.50    Types:  Cigarettes    Quit date: 2018    Years since quitting: 5.9   Smokeless tobacco: Never  Substance and Sexual Activity   Alcohol use: Not Currently   Drug use: Not Currently    Types: Cocaine   Sexual activity: Not Currently  Other Topics Concern   Not on file  Social History Narrative   Not on file   Social Determinants of Health   Financial Resource Strain: Medium Risk (08/14/2020)   Overall Financial Resource Strain (CARDIA)    Difficulty of Paying Living Expenses: Somewhat hard  Food  Insecurity: Food Insecurity Present (08/14/2020)   Hunger Vital Sign    Worried About Running Out of Food in the Last Year: Never true    Ran Out of Food in the Last Year: Sometimes true  Transportation Needs: Unmet Transportation Needs (08/14/2020)   PRAPARE - Transportation    Lack of Transportation (Medical): Yes    Lack of Transportation (Non-Medical): Yes  Physical Activity: Insufficiently Active (11/12/2020)   Exercise Vital Sign    Days of Exercise per Week: 3 days    Minutes of Exercise per Session: 20 min  Stress: No Stress Concern Present (11/12/2020)   [employer] of Occupational Health - Occupational Stress Questionnaire    Feeling of Stress : Only a little  Social Connections: Moderately Isolated (09/23/2020)   Social Connection and Isolation Panel [NHANES]    Frequency of Communication with Friends and Family: More than three times a week    Frequency of Social Gatherings with Friends and Family: More than three times a week    Attends Religious Services: 1 to 4 times per year    Active Member of [employer] or Organizations: No    Attends Engineer, structural: Never    Marital Status: Never married     Family History: The patient's family history includes Breast cancer in his mother; Heart attack (age of onset: 25) in his father; Heart attack (age of onset: 50) in his paternal uncle.  ROS:   Please see the history of present illness.     All other systems reviewed and are negative.  EKGs/Labs/Other Studies Reviewed:    The following studies were reviewed today:   Recent Labs: 07/18/2021: TSH 0.517 12/25/2021: ALT 16; B Natriuretic Peptide 588.7; BUN 15; Creatinine, Ser 1.50; Hemoglobin 19.4; Magnesium 1.9; Platelets 252; Potassium 3.8; Sodium 140   Recent Lipid Panel    Component Value Date/Time   CHOL 86 03/08/2020 0506   TRIG 40 03/08/2020 0506   HDL 22 (L) 03/08/2020 0506   CHOLHDL 3.9 03/08/2020 0506   VLDL 8 03/08/2020 0506   LDLCALC 56  03/08/2020 0506    Physical Exam:    VS:  There were no vitals taken for this visit.    Wt Readings from Last 3 Encounters:  12/25/21 287 lb 14.7 oz (130.6 kg)  07/18/21 294 lb 9.6 oz (133.6 kg)  03/31/21 (!) 306 lb (138.8 kg)     GEN: Well nourished, well developed in no acute distress HEENT: Normal NECK: No JVD; No carotid bruits LYMPHATICS: No lymphadenopathy CARDIAC: ***RRR, no murmurs, rubs, gallops RESPIRATORY:  Clear to auscultation without rales, wheezing or rhonchi  ABDOMEN: Soft, non-tender, non-distended MUSCULOSKELETAL:  No edema; No deformity  SKIN: Warm and dry NEUROLOGIC:  Alert and oriented x 3 PSYCHIATRIC:  Normal affect        ASSESSMENT:    1. Chronic systolic heart failure (HCC)   2. Persistent atrial fibrillation (HCC)  3. Encounter for long-term (current) use of high-risk medication    PLAN:    In order of problems listed above:  #Persistent atrial fibrillation Prior flutter ablation in [facility].  With contributory to his heart failure with reduced ejection fraction.  Maintaining sinus rhythm is allowed his EF to normalize.  He continues to take amiodarone 200 mg by mouth once daily.  Continue Eliquis, Coreg, amiodarone.  Discussed treatment options today for their AF including antiarrhythmic drug therapy and ablation. Discussed risks, recovery and likelihood of success. Discussed potential need for repeat ablation procedures and antiarrhythmic drugs after an initial ablation. They wish to proceed with scheduling.  Risk, benefits, and alternatives to EP study and radiofrequency ablation for afib were also discussed in detail today. These risks include but are not limited to stroke, bleeding, vascular damage, tamponade, perforation, damage to the esophagus, lungs, and other structures, pulmonary vein stenosis, worsening renal function, and death. The patient understands these risk and wishes to proceed.  We will therefore proceed with catheter  ablation at the next available time.  Carto, ICE, anesthesia are requested for the procedure.  Will also obtain CT PV protocol prior to the procedure to exclude LAA thrombus and further evaluate atrial anatomy.  Ablation strategy would be PVI plus posterior wall plus CTI.  #Chronic systolic heart failure Normalized EF now that he is maintained normal rhythm.  NYHA class II today.  Warm and dry on exam.  Continue heart failure regimen.  #Amiodarone monitoring AST/ALT okay in November 2023.   ***Plan:   Medication Adjustments/Labs and Tests Ordered: Current medicines are reviewed at length with the patient today.  Concerns regarding medicines are outlined above.   No orders of the defined types were placed in this encounter.  No orders of the defined types were placed in this encounter.  I,Mathew Stumpf,acting as a Education administrator for Vickie Epley, MD.,have documented all relevant documentation on the behalf of Vickie Epley, MD,as directed by  Vickie Epley, MD while in the presence of Vickie Epley, MD.  ***  Signed, Lars Mage, MD, [facility], [facility] 01/05/2022 7:38 AM    Electrophysiology [place]

## 2022-01-08 ENCOUNTER — Other Ambulatory Visit (HOSPITAL_COMMUNITY): Payer: Self-pay | Admitting: Internal Medicine

## 2022-01-09 ENCOUNTER — Other Ambulatory Visit (HOSPITAL_COMMUNITY): Payer: Self-pay

## 2022-01-09 MED ORDER — DIGOXIN 125 MCG PO TABS: 0.0625 mg | ORAL_TABLET | Freq: Every day | ORAL | 0 refills | Status: DC

## 2022-01-12 ENCOUNTER — Other Ambulatory Visit: Payer: Self-pay

## 2022-01-15 ENCOUNTER — Other Ambulatory Visit (HOSPITAL_COMMUNITY): Payer: Self-pay | Admitting: Internal Medicine

## 2022-01-15 ENCOUNTER — Other Ambulatory Visit (HOSPITAL_COMMUNITY): Payer: Self-pay | Admitting: Adult Health

## 2022-01-16 ENCOUNTER — Other Ambulatory Visit (HOSPITAL_COMMUNITY): Payer: Self-pay | Admitting: Adult Health

## 2022-01-29 ENCOUNTER — Ambulatory Visit (HOSPITAL_COMMUNITY)
Admission: EM | Admit: 2022-01-29 | Discharge: 2022-01-29 | Disposition: A | Discharge status: Home / Self Care | Payer: Medicaid Other | Attending: Emergency Medicine | Admitting: Emergency Medicine

## 2022-01-29 ENCOUNTER — Encounter (HOSPITAL_COMMUNITY): Payer: Self-pay

## 2022-01-29 DIAGNOSIS — B349 Viral infection, unspecified: Secondary | ICD-10-CM

## 2022-01-29 DIAGNOSIS — R0602 Shortness of breath: Secondary | ICD-10-CM | POA: Insufficient documentation

## 2022-01-29 DIAGNOSIS — Z1152 Encounter for screening for COVID-19: Secondary | ICD-10-CM | POA: Insufficient documentation

## 2022-01-29 DIAGNOSIS — J4520 Mild intermittent asthma, uncomplicated: Secondary | ICD-10-CM | POA: Diagnosis not present

## 2022-01-29 LAB — POC INFLUENZA A AND B ANTIGEN (URGENT CARE ONLY)
INFLUENZA A ANTIGEN, POC: NEGATIVE
INFLUENZA B ANTIGEN, POC: NEGATIVE

## 2022-01-29 MED ORDER — ALBUTEROL SULFATE HFA 108 (90 BASE) MCG/ACT IN AERS: 2.0000 | INHALATION_SPRAY | RESPIRATORY_TRACT | 0 refills | Status: DC | PRN

## 2022-01-29 MED ORDER — MOLNUPIRAVIR 200 MG PO CAPS: 4.0000 | ORAL_CAPSULE | Freq: Two times a day (BID) | ORAL | 0 refills | Status: AC

## 2022-01-29 MED ORDER — BENZONATATE 100 MG PO CAPS: 100.0000 mg | ORAL_CAPSULE | Freq: Three times a day (TID) | ORAL | 0 refills | Status: DC

## 2022-01-29 NOTE — ED Triage Notes (Signed)
Chief Complaint: cough is productive with yellow mucus, sore throat, SOB with labored breathing. Nasal drainage, loss of smell, weakness as well. Patient has history of asthma and COPD.   Onset: yesterday  Prescriptions or OTC medications tried: Yes- TheraFlu, Nyquil    with no relief  Sick exposure: No  New foods, medications, or products: No  Recent Travel: No

## 2022-01-29 NOTE — Discharge Instructions (Addendum)
Tessalon has been sent to the pharmacy for cough, you can use this medication every 8 hours as needed.  A refill of your albuterol inhaler was sent to the pharmacy.  Molnupiravir is an antiviral medicine for COVID, if your COVID test results are positive, you may start taking this medicine, take 4 capsules 2 times daily for the next 5 days.   We will call you if any of your test results warrant a change in your plan of care, you may view these test results on MyChart.   Viral illnesses usually takes 7 to 10 days to resolve.  Using over-the-counter medications can help treat the symptoms.  You may rotate Tylenol and ibuprofen every 4-6 hours.  For example, take Tylenol now and then 4 to 6 hours later take ibuprofen.  You can use Tylenol for fever and moderate pain, you can take this medication every 4-6 hours, please do not take more than 3000 mg in a 24-hour day.  You can take ibuprofen every 6 hours, do not take more than 2400 mg in a 24-hour day.  I advised that you do not take ibuprofen on an empty stomach, ibuprofen can cause GI problems such as GI bleeding.  For nasal congestion, you can buy Flonase nasal spray over-the-counter, this is an intranasal steroid.  You will put 2 sprays in each nostril during application.  I suggest that you would use Flonase upon waking in the morning and in the evening.  Mucinex is an expectorant that can be purchased over-the-counter, this can help clear congestion from your respiratory tract.  For Mucinex dosage, please refer to the back of the box for dosage instructions, this can vary depending on the brand of medication.  Chloraseptic throat spray and lozenges can be used for sore throat.  You may use warm liquids and honey for symptom management.  Maintaining hydration status is very important, please drink at least 8 cups of water daily.  Please try to intake nutrient dense meals.   If you develop any severe/concerning symptoms please go to the nearest emergency  department.

## 2022-01-29 NOTE — ED Provider Notes (Signed)
[place]    CSN: [id number] Arrival date & time: 01/29/22  1154      History   Chief Complaint Chief Complaint  Patient presents with   Cough   Fatigue   Shortness of Breath    HPI Donald [name] is a 62 y.o. male.  Patient presents complaining of productive cough with yellow sputum, sore throat, and nasal congestion x 1 day.  Patient reports loss of smell and fatigue.  Patient denies any known exposure to a viral illness.  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  He reports a history of asthma and COPD.  He denies that any wheezing has occurred.  He has taken TheraFlu and NyQuil with minimal relief of symptoms. Patient reports adhering to his chronic health medication regiment.  Exhaustive list of medical history is listed below.   Cough Associated symptoms: chills, rhinorrhea, shortness of breath and sore throat   Associated symptoms: no chest pain, no ear pain, no fever and no wheezing   Shortness of Breath Associated symptoms: cough and sore throat   Associated symptoms: no chest pain, no ear pain, no fever and no wheezing     Past Medical History:  Diagnosis Date   Arrhythmia    A fib    Atrial fibrillation (HCC)    CHF (congestive heart failure) (HCC)    COPD (chronic obstructive pulmonary disease) (HCC)    Diabetes mellitus without complication ([place])    Hypertension    Sleep apnea    Stroke [facility])     Patient Active Problem List   Diagnosis Date Noted   Morbid obesity ([place]) 12/26/2021   Polycythemia 12/26/2021   AKI (acute kidney injury) ([place]) 93/90/3009   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 03/31/2021   Aphasia as late effect of cerebrovascular accident (CVA) 03/31/2021   Secondary hypercoagulable state ([place]) 08/29/2020   OSA on CPAP 08/06/2020   Acute pulmonary embolism ([place]) 08/06/2020   Atrial fibrillation with RVR ([place]) 03/08/2020   Pulmonary embolism ([place]) 03/08/2020   Essential  hypertension 03/08/2020   COPD (chronic obstructive pulmonary disease) ([place]) 03/08/2020   Anemia 03/08/2020   COPD exacerbation ([place]) 11/16/2014   Atypical chest pain 11/16/2014   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   Paroxysmal atrial fibrillation ([place]) 11/16/2014   Abnormal EKG 11/16/2014   Type 2 diabetes mellitus without complication ([place]) 23/30/0762   Perirectal abscess 10/20/2012    Past Surgical History:  Procedure Laterality Date   ABLATION     CARDIOVERSION N/A 03/12/2020   Procedure: CARDIOVERSION;  Surgeon: Skeet Latch, MD;  Location: [place];  Service: Cardiovascular;  Laterality: N/A;   CARDIOVERSION N/A 08/13/2020   Procedure: CARDIOVERSION;  Surgeon: Jolaine Artist, MD;  Location: [facility] ENDOSCOPY;  Service: Cardiovascular;  Laterality: N/A;   CARDIOVERSION N/A 09/02/2020   Procedure: CARDIOVERSION;  Surgeon: Elouise Munroe, MD;  Location: [facility] ENDOSCOPY;  Service: Cardiovascular;  Laterality: N/A;   INCISION AND DRAINAGE PERIRECTAL ABSCESS N/A 10/20/2012   Procedure: IRRIGATION AND DEBRIDEMENT PERIRECTAL ABSCESS;  Surgeon: Imogene Burn. Georgette Dover, MD;  Location: [place];  Service: General;  Laterality: N/A;  Lithotomy   RIGHT/LEFT HEART CATH AND CORONARY ANGIOGRAPHY N/A 08/12/2020   Procedure: RIGHT/LEFT HEART CATH AND CORONARY ANGIOGRAPHY;  Surgeon: Jolaine Artist, MD;  Location: [place] CV LAB;  Service: Cardiovascular;  Laterality: N/A;   TEE WITHOUT CARDIOVERSION N/A 03/12/2020   Procedure: TRANSESOPHAGEAL ECHOCARDIOGRAM (TEE);  Surgeon: Skeet Latch,  MD;  Location: [place];  Service: Cardiovascular;  Laterality: N/A;   TEE WITHOUT CARDIOVERSION N/A 08/13/2020   Procedure: TRANSESOPHAGEAL ECHOCARDIOGRAM (TEE);  Surgeon: Jolaine Artist, MD;  Location: [facility] ENDOSCOPY;  Service: Cardiovascular;  Laterality: N/A;       Home Medications    Prior to Admission medications   Medication Sig Start Date End Date Taking? Authorizing Provider   Accu-Chek Softclix Lancets lancets Use as directed 08/15/20  Yes Alma Friendly, MD  albuterol (PROVENTIL) (2.5 MG/3ML) 0.083% nebulizer solution Take 3 mLs (2.5 mg total) by nebulization every 4 (four) hours as needed for wheezing or shortness of breath. 10/07/15  Yes Malvin Johns, MD  albuterol (VENTOLIN HFA) 108 (90 Base) MCG/ACT inhaler Inhale 2 puffs into the lungs every 4 (four) hours as needed for shortness of breath. 07/14/21 01/30/29 Yes Gildardo Pounds, NP  albuterol (VENTOLIN HFA) 108 (90 Base) MCG/ACT inhaler Inhale 2 puffs into the lungs every 4 (four) hours as needed for wheezing or shortness of breath. 01/29/22  Yes Flossie Dibble, NP  amiodarone (PACERONE) 200 MG tablet TAKE 1 TABLET (200 MG TOTAL) BY MOUTH DAILY (AM) Patient taking differently: Take 200 mg by mouth daily. 09/03/21  Yes Bensimhon, Shaune Pascal, MD  ammonium lactate (AMLACTIN) 12 % lotion Apply 1 Application topically as needed for dry skin. 01/01/22  Yes Trula Slade, DPM  atorvastatin (LIPITOR) 20 MG tablet TAKE 1 TABLET BY MOUTH EVERY DAY 01/16/22  Yes Bensimhon, Shaune Pascal, MD  benzonatate (TESSALON) 100 MG capsule Take 1 capsule (100 mg total) by mouth every 8 (eight) hours. 01/29/22  Yes Flossie Dibble, NP  Blood Glucose Monitoring Suppl (BLOOD GLUCOSE MONITOR SYSTEM) w/Device KIT USE AS DIRECTED 08/15/20  Yes Alma Friendly, MD  Blood Pressure Monitor DEVI Please provide patient with insurance approved blood pressure monitor ICD 10  I10 07/14/21  Yes Gildardo Pounds, NP  carvedilol (COREG) 3.125 MG tablet TAKE 1 TABLET (3.125 MG TOTAL) BY MOUTH 2 (TWO) TIMES DAILY (AM+EVENING) 12/26/21  Yes [place], Maricela Bo, FNP  ciclopirox (PENLAC) 8 % solution Apply topically at bedtime. Apply over nail and surrounding skin. Apply daily over previous coat. After seven (7) days, may remove with alcohol and continue cycle. 01/01/22  Yes Trula Slade, DPM  digoxin (LANOXIN) 0.125 MG tablet Take 0.5 tablets  (0.0625 mg total) by mouth daily. NEEDS FOLLOW UP APPOINTMENT FOR ANYMORE REFILLS 01/09/22  Yes [place], Perry, FNP  ELIQUIS 5 MG TABS tablet TAKE 1 TABLET BY MOUTH 2 TIMES DAILY 01/16/22  Yes Bensimhon, Shaune Pascal, MD  FARXIGA 10 MG TABS tablet TAKE 1 TABLET BY MOUTH EVERY MORNING BEFORE breakfast 01/08/22  Yes Bensimhon, Shaune Pascal, MD  molnupiravir EUA (LAGEVRIO) 200 MG CAPS capsule Take 4 capsules (800 mg total) by mouth 2 (two) times daily for 5 days. 01/29/22 02/03/22 Yes Kinneth Fujiwara, Fidela Juneau, NP  mometasone-formoterol (DULERA) 100-5 MCG/ACT AERO Inhale 1 puff into the lungs daily.   Yes [provider]  OZEMPIC, 0.25 OR 0.5 MG/DOSE, 2 MG/3ML SOPN INJECT 0.5 MG INTO THE SKIN ONCE A WEEK. Patient taking differently: Inject 0.5 mg into the skin once a week. 09/02/21  Yes Gildardo Pounds, NP  sacubitril-valsartan (ENTRESTO) 49-51 MG TAKE 1 TABLET BY MOUTH 2 (TWO) TIMES DAILY. (AM+EVENING) 12/26/21  Yes [place], Rosenhayn, FNP  spironolactone (ALDACTONE) 25 MG tablet TAKE 1 TABLET BY MOUTH EVERY DAY 01/15/22  Yes Clegg, Amy D, NP  torsemide (DEMADEX) 20 MG tablet  TAKE 1 TABLET BY MOUTH 2 TIMES DAILY 01/16/22  Yes Bensimhon, Shaune Pascal, MD  traZODone (DESYREL) 100 MG tablet Take 0.5-1 tablets (50-100 mg total) by mouth at bedtime. 07/14/21  Yes Gildardo Pounds, NP  budesonide-formoterol (SYMBICORT) 80-4.5 MCG/ACT inhaler Inhale 2 puffs into the lungs in the morning and at bedtime. 08/15/20 09/14/20  Alma Friendly, MD    Family History Family History  Problem Relation Age of Onset   Breast cancer Mother    Heart attack Father 39   Heart attack Paternal Uncle 14    Social History Social History   Tobacco Use   Smoking status: Former    Packs/day: 0.50    Types: Cigarettes    Quit date: 2018    Years since quitting: 6.0   Smokeless tobacco: Never  Substance Use Topics   Alcohol use: Not Currently   Drug use: Not Currently    Types: Cocaine     Allergies   Patient has no  known allergies.   Review of Systems Review of Systems  Constitutional:  Positive for activity change, appetite change, chills and fatigue. Negative for fever.  HENT:  Positive for congestion, rhinorrhea and sore throat. Negative for ear discharge, ear pain, postnasal drip, sinus pressure and sinus pain.   Eyes: Negative.   Respiratory:  Positive for cough and shortness of breath. Negative for chest tightness and wheezing.   Cardiovascular:  Negative for chest pain and palpitations.  Gastrointestinal: Negative.      Physical Exam Triage Vital Signs ED Triage Vitals  Enc Vitals Group     BP 01/29/22 1450 (!) 114/100     Pulse Rate 01/29/22 1450 (!) 103     Resp 01/29/22 1450 20     Temp 01/29/22 1450 98.3 F (36.8 C)     Temp Source 01/29/22 1450 Oral     SpO2 01/29/22 1450 94 %     Weight 01/29/22 1450 287 lb 14.7 oz (130.6 kg)     Height 01/29/22 1450 6' (1.829 m)     Head Circumference --      Peak Flow --      Pain Score 01/29/22 1449 8     Pain Loc --      Pain Edu? --      Excl. in [place]? --    No data found.  Updated Vital Signs BP (!) 114/100 (BP Location: Left Arm)   Pulse (!) 103   Temp 98.3 F (36.8 C) (Oral)   Resp 20   Ht 6' (1.829 m)   Wt 287 lb 14.7 oz (130.6 kg)   SpO2 94%   BMI 39.05 kg/m      Physical Exam Vitals and nursing note reviewed.  HENT:     Right Ear: Hearing, tympanic membrane, ear canal and external ear normal.     Left Ear: Hearing, tympanic membrane, ear canal and external ear normal.     Nose: Rhinorrhea present. No congestion. Rhinorrhea is clear.     Right Turbinates: Not enlarged, swollen or pale.     Left Turbinates: Not enlarged, swollen or pale.     Mouth/Throat:     Mouth: Mucous membranes are moist.     Pharynx: Oropharynx is clear. Uvula midline. No pharyngeal swelling, oropharyngeal exudate, posterior oropharyngeal erythema or uvula swelling.     Tonsils: No tonsillar exudate or tonsillar abscesses. 0 on the right. 0  on the left.  Cardiovascular:     Rate and Rhythm: Normal rate.  Rhythm irregularly irregular.     Heart sounds: Normal heart sounds.     Comments: History of diagnosed A-Fib Pulmonary:     Effort: Pulmonary effort is normal.     Breath sounds: Normal air entry. Examination of the right-lower field reveals decreased breath sounds. Examination of the left-lower field reveals decreased breath sounds. Decreased breath sounds present. No wheezing, rhonchi or rales.  Lymphadenopathy:     Cervical: No cervical adenopathy.  Neurological:     Mental Status: He is alert.      UC Treatments / Results  Labs (all labs ordered are listed, but only abnormal results are displayed) Labs Reviewed  SARS CORONAVIRUS 2 (TAT 6-24 HRS)  POC INFLUENZA A AND B ANTIGEN (URGENT CARE ONLY)    EKG   Radiology No results found.  Procedures Procedures (including critical care time)  Medications Ordered in UC Medications - No data to display  Initial Impression / Assessment and Plan / UC Course  I have reviewed the triage vital signs and the nursing notes.  Pertinent labs & imaging results that were available during my care of the patient were reviewed by me and considered in my medical decision making (see chart for details).     Patient was evaluated for viral illness and shortness of breath.  POC influenza test performed which showed negative result.  COVID test is pending.  High suspicion for COVID-19.  Due to symptomology and patient's medical history, patient was given a prescription for molnupiravir.  Tessalon and albuterol inhaler refill were sent upon patient's request.  Patient was made aware of symptom management for viral illness.  Patient was made aware of red flag symptoms that would warrant immediate follow-up due to his medical history.  Work note was given.Patient made aware of timeline for symptom resolution and when follow-up would be necessary.  Patient made aware of results reporting  protocol and MyChart.  Patient verbalized understanding of instructions.    Charting was provided using a a verbal dictation system, charting was proofread for errors, errors may occur which could change the meaning of the information charted.   Final Clinical Impressions(s) / UC Diagnoses   Final diagnoses:  Viral illness  Shortness of breath     Discharge Instructions      Tessalon has been sent to the pharmacy for cough, you can use this medication every 8 hours as needed.  A refill of your albuterol inhaler was sent to the pharmacy.  Molnupiravir is an antiviral medicine for COVID, if your COVID test results are positive, you may start taking this medicine, take 4 capsules 2 times daily for the next 5 days.   We will call you if any of your test results warrant a change in your plan of care, you may view these test results on MyChart.   Viral illnesses usually takes 7 to 10 days to resolve.  Using over-the-counter medications can help treat the symptoms.  You may rotate Tylenol and ibuprofen every 4-6 hours.  For example, take Tylenol now and then 4 to 6 hours later take ibuprofen.  You can use Tylenol for fever and moderate pain, you can take this medication every 4-6 hours, please do not take more than 3000 mg in a 24-hour day.  You can take ibuprofen every 6 hours, do not take more than 2400 mg in a 24-hour day.  I advised that you do not take ibuprofen on an empty stomach, ibuprofen can cause GI problems such as GI bleeding.  For nasal congestion, you can buy Flonase nasal spray over-the-counter, this is an intranasal steroid.  You will put 2 sprays in each nostril during application.  I suggest that you would use Flonase upon waking in the morning and in the evening.  Mucinex is an expectorant that can be purchased over-the-counter, this can help clear congestion from your respiratory tract.  For Mucinex dosage, please refer to the back of the box for dosage instructions, this can vary  depending on the brand of medication.  Chloraseptic throat spray and lozenges can be used for sore throat.  You may use warm liquids and honey for symptom management.  Maintaining hydration status is very important, please drink at least 8 cups of water daily.  Please try to intake nutrient dense meals.   If you develop any severe/concerning symptoms please go to the nearest emergency department.      ED Prescriptions     Medication Sig Dispense Auth. Provider   albuterol (VENTOLIN HFA) 108 (90 Base) MCG/ACT inhaler Inhale 2 puffs into the lungs every 4 (four) hours as needed for wheezing or shortness of breath. 6.7 g Flossie Dibble, NP   benzonatate (TESSALON) 100 MG capsule Take 1 capsule (100 mg total) by mouth every 8 (eight) hours. 21 capsule Abcde Oneil N, NP   molnupiravir EUA (LAGEVRIO) 200 MG CAPS capsule Take 4 capsules (800 mg total) by mouth 2 (two) times daily for 5 days. 40 capsule Flossie Dibble, NP      PDMP not reviewed this encounter.   Flossie Dibble, NP 01/29/22 [id number]

## 2022-01-30 ENCOUNTER — Other Ambulatory Visit (HOSPITAL_COMMUNITY): Payer: Self-pay

## 2022-01-30 ENCOUNTER — Other Ambulatory Visit (HOSPITAL_COMMUNITY): Payer: Self-pay | Admitting: Internal Medicine

## 2022-01-30 DIAGNOSIS — I5043 Acute on chronic combined systolic (congestive) and diastolic (congestive) heart failure: Secondary | ICD-10-CM

## 2022-01-30 DIAGNOSIS — I4891 Unspecified atrial fibrillation: Secondary | ICD-10-CM

## 2022-01-30 LAB — SARS CORONAVIRUS 2 (TAT 6-24 HRS): SARS Coronavirus 2: NEGATIVE

## 2022-01-30 MED ORDER — ENTRESTO 49-51 MG PO TABS: ORAL_TABLET | ORAL | 3 refills | Status: DC

## 2022-01-30 MED ORDER — DIGOXIN 125 MCG PO TABS: 0.0625 mg | ORAL_TABLET | Freq: Every day | ORAL | 3 refills | Status: DC

## 2022-02-02 ENCOUNTER — Other Ambulatory Visit (HOSPITAL_COMMUNITY): Payer: Self-pay

## 2022-02-02 DIAGNOSIS — I5043 Acute on chronic combined systolic (congestive) and diastolic (congestive) heart failure: Secondary | ICD-10-CM

## 2022-02-02 DIAGNOSIS — I4891 Unspecified atrial fibrillation: Secondary | ICD-10-CM

## 2022-02-02 MED ORDER — CARVEDILOL 3.125 MG PO TABS: ORAL_TABLET | ORAL | 0 refills | Status: DC

## 2022-02-06 ENCOUNTER — Other Ambulatory Visit (HOSPITAL_COMMUNITY): Payer: Self-pay | Admitting: Family Medicine

## 2022-02-16 ENCOUNTER — Other Ambulatory Visit (HOSPITAL_COMMUNITY): Payer: Self-pay | Admitting: Internal Medicine

## 2022-03-02 ENCOUNTER — Ambulatory Visit: Payer: Medicaid Other | Admitting: Physician Assistant

## 2022-03-02 ENCOUNTER — Other Ambulatory Visit (HOSPITAL_COMMUNITY): Payer: Self-pay | Admitting: Family Medicine

## 2022-03-02 ENCOUNTER — Other Ambulatory Visit (HOSPITAL_COMMUNITY): Payer: Self-pay | Admitting: Internal Medicine

## 2022-03-02 DIAGNOSIS — I4891 Unspecified atrial fibrillation: Secondary | ICD-10-CM

## 2022-03-02 DIAGNOSIS — I5043 Acute on chronic combined systolic (congestive) and diastolic (congestive) heart failure: Secondary | ICD-10-CM

## 2022-03-07 ENCOUNTER — Ambulatory Visit (HOSPITAL_COMMUNITY)
Admission: EM | Admit: 2022-03-07 | Discharge: 2022-03-07 | Disposition: A | Discharge status: Home / Self Care | Payer: Medicaid Other | Attending: Physician Assistant | Admitting: Physician Assistant

## 2022-03-07 ENCOUNTER — Encounter (HOSPITAL_COMMUNITY): Payer: Self-pay | Admitting: Emergency Medicine

## 2022-03-07 DIAGNOSIS — R197 Diarrhea, unspecified: Secondary | ICD-10-CM | POA: Insufficient documentation

## 2022-03-07 LAB — CBC WITH DIFFERENTIAL/PLATELET
Abs Immature Granulocytes: 0.02 10*3/uL (ref 0.00–0.07)
Basophils Absolute: 0 10*3/uL (ref 0.0–0.1)
Basophils Relative: 0 %
Eosinophils Absolute: 0.1 10*3/uL (ref 0.0–0.5)
Eosinophils Relative: 2 %
HCT: 45.4 % (ref 39.0–52.0)
Hemoglobin: 14.8 g/dL (ref 13.0–17.0)
Immature Granulocytes: 0 %
Lymphocytes Relative: 18 %
Lymphs Abs: 0.9 10*3/uL (ref 0.7–4.0)
MCH: 28.1 pg (ref 26.0–34.0)
MCHC: 32.6 g/dL (ref 30.0–36.0)
MCV: 86.1 fL (ref 80.0–100.0)
Monocytes Absolute: 0.8 10*3/uL (ref 0.1–1.0)
Monocytes Relative: 15 %
Neutro Abs: 3.2 10*3/uL (ref 1.7–7.7)
Neutrophils Relative %: 65 %
Platelets: 272 10*3/uL (ref 150–400)
RBC: 5.27 MIL/uL (ref 4.22–5.81)
RDW: 13.9 % (ref 11.5–15.5)
WBC: 5 10*3/uL (ref 4.0–10.5)
nRBC: 0 % (ref 0.0–0.2)

## 2022-03-07 LAB — COMPREHENSIVE METABOLIC PANEL
ALT: 30 U/L (ref 0–44)
AST: 27 U/L (ref 15–41)
Albumin: 3.5 g/dL (ref 3.5–5.0)
Alkaline Phosphatase: 56 U/L (ref 38–126)
Anion gap: 14 (ref 5–15)
BUN: 9 mg/dL (ref 6–20)
CO2: 25 mmol/L (ref 22–32)
Calcium: 8.8 mg/dL — ABNORMAL LOW (ref 8.9–10.3)
Chloride: 96 mmol/L — ABNORMAL LOW (ref 98–111)
Creatinine, Ser: 1.29 mg/dL — ABNORMAL HIGH (ref 0.61–1.24)
GFR, Estimated: >60 mL/min (ref >60)
Glucose, Bld: 117 mg/dL — ABNORMAL HIGH (ref 70–99)
Potassium: 3.7 mmol/L (ref 3.5–5.1)
Sodium: 135 mmol/L (ref 135–145)
Total Bilirubin: 0.9 mg/dL (ref 0.3–1.2)
Total Protein: 7 g/dL (ref 6.5–8.1)

## 2022-03-07 MED ORDER — LOPERAMIDE HCL 2 MG PO CAPS: 2.0000 mg | ORAL_CAPSULE | Freq: Four times a day (QID) | ORAL | 0 refills | Status: DC | PRN

## 2022-03-07 NOTE — Discharge Instructions (Addendum)
Good to meet you today.  I think you may have a virus that has been causing this persistent diarrhea.  I do advise for the next 24 hours to have a clear liquid diet, and then you may progress to bland foods such as bananas, rice, applesauce, toast, yogurt.  Take the Imodium as directed.  Please return the stool sample as you are able to provide a specimen and we can treat as needed pending stool results.  We will call you back if any abnormalities on lab work.  Be sure to stay well-hydrated.  You may have Gatorade or Propel to help keep your electrolytes well-balanced.  Follow up with primary care.  If any blood in stool, fever, fainting, or other acute severe symptoms, go to the nearest hospital.

## 2022-03-07 NOTE — ED Provider Notes (Signed)
[place]   MRN: [id number] DOB: Nov 25, 1960  Subjective:   Donald [name] is a 62 y.o. male with a history of type 2 diabetes, paroxysmal A-fib, hypertension, presenting for 1 week of diarrhea.  Patient reports that he started having loose stools approximately 7-8 times per day about 1 week ago.  Denies any known sick contacts.  Works as a Dance movement psychotherapist for [employer]. No recent travel, no new foods.  He denies having any history of GI issues.  He has not had any blood in the stool.  No fever or chills.  No body aches.  No severe abdominal pain.  No nausea or vomiting.  He tried a dose of Imodium last night without much relief.  He feels tired today.  He has been able to eat and drink as normal.  No current facility-administered medications for this encounter.  Current Outpatient Medications:    loperamide (IMODIUM) 2 MG capsule, Take 1 capsule (2 mg total) by mouth 4 (four) times daily as needed for diarrhea or loose stools., Disp: 12 capsule, Rfl: 0   Accu-Chek Softclix Lancets lancets, Use as directed, Disp: 100 each, Rfl: 0   albuterol (PROVENTIL) (2.5 MG/3ML) 0.083% nebulizer solution, Take 3 mLs (2.5 mg total) by nebulization every 4 (four) hours as needed for wheezing or shortness of breath., Disp: 30 vial, Rfl: 0   albuterol (VENTOLIN HFA) 108 (90 Base) MCG/ACT inhaler, Inhale 2 puffs into the lungs every 4 (four) hours as needed for shortness of breath., Disp: 18 g, Rfl: 1   albuterol (VENTOLIN HFA) 108 (90 Base) MCG/ACT inhaler, Inhale 2 puffs into the lungs every 4 (four) hours as needed for wheezing or shortness of breath., Disp: 6.7 g, Rfl: 0   amiodarone (PACERONE) 200 MG tablet, TAKE 1 TABLET (200 MG TOTAL) BY MOUTH DAILY (AM) (Patient taking differently: Take 200 mg by mouth daily.), Disp: 90 tablet, Rfl: 3   ammonium lactate (AMLACTIN) 12 % lotion, Apply 1 Application topically as needed for dry skin., Disp: 400 g, Rfl: 0   atorvastatin (LIPITOR) 20 MG tablet, TAKE  1 TABLET BY MOUTH EVERY DAY, Disp: 30 tablet, Rfl: 0   benzonatate (TESSALON) 100 MG capsule, Take 1 capsule (100 mg total) by mouth every 8 (eight) hours., Disp: 21 capsule, Rfl: 0   Blood Glucose Monitoring Suppl (BLOOD GLUCOSE MONITOR SYSTEM) w/Device KIT, USE AS DIRECTED, Disp: 1 kit, Rfl: 0   Blood Pressure Monitor DEVI, Please provide patient with insurance approved blood pressure monitor ICD 10  I10, Disp: 1 each, Rfl: 0   budesonide-formoterol (SYMBICORT) 80-4.5 MCG/ACT inhaler, Inhale 2 puffs into the lungs in the morning and at bedtime., Disp: 10.2 g, Rfl: 0   carvedilol (COREG) 3.125 MG tablet, TAKE 1 TABLET BY MOUTH 2 TIMES DAILY, Disp: 90 tablet, Rfl: 0   ciclopirox (PENLAC) 8 % solution, Apply topically at bedtime. Apply over nail and surrounding skin. Apply daily over previous coat. After seven (7) days, may remove with alcohol and continue cycle., Disp: 6.6 mL, Rfl: 2   digoxin (LANOXIN) 0.125 MG tablet, Take 0.5 tablets (0.0625 mg total) by mouth daily. NEEDS FOLLOW UP APPOINTMENT FOR ANYMORE REFILLS, Disp: 15 tablet, Rfl: 3   ELIQUIS 5 MG TABS tablet, TAKE 1 TABLET BY MOUTH 2 TIMES DAILY, Disp: 60 tablet, Rfl: 0   FARXIGA 10 MG TABS tablet, TAKE 1 TABLET BY MOUTH EVERY MORNING BEFORE BREAKFAST, Disp: 30 tablet, Rfl: 0   mometasone-formoterol (DULERA) 100-5 MCG/ACT AERO, Inhale  1 puff into the lungs daily., Disp: , Rfl:    OZEMPIC, 0.25 OR 0.5 MG/DOSE, 2 MG/3ML SOPN, INJECT 0.5 MG INTO THE SKIN ONCE A WEEK. (Patient taking differently: Inject 0.5 mg into the skin once a week.), Disp: 3 mL, Rfl: 1   sacubitril-valsartan (ENTRESTO) 49-51 MG, TAKE 1 TABLET BY MOUTH 2 (TWO) TIMES DAILY. (AM+EVENING), Disp: 60 tablet, Rfl: 3   spironolactone (ALDACTONE) 25 MG tablet, TAKE 1 TABLET BY MOUTH EVERY DAY, Disp: 30 tablet, Rfl: 1   torsemide (DEMADEX) 20 MG tablet, TAKE 1 TABLET BY MOUTH 2 TIMES DAILY, Disp: 60 tablet, Rfl: 0   traZODone (DESYREL) 100 MG tablet, Take 0.5-1 tablets (50-100 mg  total) by mouth at bedtime., Disp: 90 tablet, Rfl: 1   No Known Allergies  Past Medical History:  Diagnosis Date   Arrhythmia    A fib    Atrial fibrillation (HCC)    CHF (congestive heart failure) (HCC)    COPD (chronic obstructive pulmonary disease) ([place])    Diabetes mellitus without complication ([place])    Hypertension    Sleep apnea    Stroke [facility])      Past Surgical History:  Procedure Laterality Date   ABLATION     CARDIOVERSION N/A 03/12/2020   Procedure: CARDIOVERSION;  Surgeon: Skeet Latch, MD;  Location: [place];  Service: Cardiovascular;  Laterality: N/A;   CARDIOVERSION N/A 08/13/2020   Procedure: CARDIOVERSION;  Surgeon: Jolaine Artist, MD;  Location: [place];  Service: Cardiovascular;  Laterality: N/A;   CARDIOVERSION N/A 09/02/2020   Procedure: CARDIOVERSION;  Surgeon: Elouise Munroe, MD;  Location: [place];  Service: Cardiovascular;  Laterality: N/A;   INCISION AND DRAINAGE PERIRECTAL ABSCESS N/A 10/20/2012   Procedure: IRRIGATION AND DEBRIDEMENT PERIRECTAL ABSCESS;  Surgeon: Imogene Burn. Georgette Dover, MD;  Location: [place];  Service: General;  Laterality: N/A;  Lithotomy   RIGHT/LEFT HEART CATH AND CORONARY ANGIOGRAPHY N/A 08/12/2020   Procedure: RIGHT/LEFT HEART CATH AND CORONARY ANGIOGRAPHY;  Surgeon: Jolaine Artist, MD;  Location: [place] CV LAB;  Service: Cardiovascular;  Laterality: N/A;   TEE WITHOUT CARDIOVERSION N/A 03/12/2020   Procedure: TRANSESOPHAGEAL ECHOCARDIOGRAM (TEE);  Surgeon: Skeet Latch, MD;  Location: [place];  Service: Cardiovascular;  Laterality: N/A;   TEE WITHOUT CARDIOVERSION N/A 08/13/2020   Procedure: TRANSESOPHAGEAL ECHOCARDIOGRAM (TEE);  Surgeon: Jolaine Artist, MD;  Location[facility] ENDOSCOPY;  Service: Cardiovascular;  Laterality: N/A;    Family History  Problem Relation Age of Onset   Breast cancer Mother    Heart attack Father 73   Heart attack Paternal Uncle 70    Social History   Tobacco  Use   Smoking status: Former    Packs/day: 0.50    Types: Cigarettes    Quit date: 2018    Years since quitting: 6.1   Smokeless tobacco: Never  Substance Use Topics   Alcohol use: Not Currently   Drug use: Not Currently    Types: Cocaine    ROS REFER TO HPI FOR PERTINENT POSITIVES AND NEGATIVES   Objective:   Vitals: BP 117/80 (BP Location: Right Arm)   Pulse 84   Temp 98 F (36.7 C) (Oral)   Resp 20   SpO2 94%   Physical Exam Vitals and nursing note reviewed.  Constitutional:      Appearance: Normal appearance. He is obese.  Cardiovascular:     Rate and Rhythm: Normal rate. Rhythm irregular.     Heart sounds: No murmur heard. Pulmonary:  Effort: Pulmonary effort is normal.     Breath sounds: Normal breath sounds.  Abdominal:     General: Abdomen is flat. Bowel sounds are normal. There is no distension.     Palpations: Abdomen is soft. There is no mass.     Tenderness: There is no abdominal tenderness. There is no right CVA tenderness, left CVA tenderness, guarding or rebound.  Neurological:     General: No focal deficit present.     Mental Status: He is alert and oriented to person, place, and time.  Psychiatric:        Mood and Affect: Mood normal.        Behavior: Behavior normal.     No results found for this or any previous visit (from the past 24 hour(s)).  Assessment and Plan :   PDMP not reviewed this encounter.  1. Diarrhea, unspecified type    Overall well-appearing 62 year old male with 1 week history of diarrhea.  Vital signs are stable.  No red flags on exam or per his history.  Most likely has viral etiology of symptoms, question possible norovirus or equivalent.  Plan to check CBC and CMP today to ensure no major concerns there.  He had a loose bowel movement while he was here.  He will try to provide a stool sample at home and bring this back for testing. Most likely will need to give this time to resolve, did not see indication for  antibiotics unless stool testing indicates otherwise. Will try Imodium and hydration at home. Needs to stay out of work until this greatly improves. Pt agreeable and understanding. ED precautions discussed.      AllwardtRanda Evens, PA-C 03/07/22 1110

## 2022-03-07 NOTE — ED Triage Notes (Signed)
Pt reports that he had diarrhea for week and having fatigued.  Took imodium without relief.

## 2022-03-09 ENCOUNTER — Telehealth (HOSPITAL_COMMUNITY): Payer: Self-pay | Admitting: Emergency Medicine

## 2022-03-09 LAB — GASTROINTESTINAL PANEL BY PCR, STOOL (REPLACES STOOL CULTURE)
Adenovirus F40/41: NOT DETECTED
Astrovirus: NOT DETECTED
Campylobacter species: DETECTED — AB
Cryptosporidium: NOT DETECTED
Cyclospora cayetanensis: NOT DETECTED
Entamoeba histolytica: NOT DETECTED
Enteroaggregative E coli (EAEC): NOT DETECTED
Enteropathogenic E coli (EPEC): NOT DETECTED
Enterotoxigenic E coli (ETEC): NOT DETECTED
Giardia lamblia: NOT DETECTED
Norovirus GI/GII: NOT DETECTED
Plesimonas shigelloides: NOT DETECTED
Rotavirus A: DETECTED — AB
Salmonella species: NOT DETECTED
Sapovirus (I, II, IV, and V): NOT DETECTED
Shiga like toxin producing E coli (STEC): NOT DETECTED
Shigella/Enteroinvasive E coli (EIEC): NOT DETECTED
Vibrio cholerae: NOT DETECTED
Vibrio species: NOT DETECTED
Yersinia enterocolitica: NOT DETECTED

## 2022-03-10 ENCOUNTER — Other Ambulatory Visit: Payer: Self-pay | Admitting: Physician Assistant

## 2022-03-11 NOTE — Telephone Encounter (Signed)
Provider decided against prescription, see result comments from recent visit

## 2022-03-16 ENCOUNTER — Other Ambulatory Visit (HOSPITAL_COMMUNITY): Payer: Self-pay | Admitting: Adult Health

## 2022-03-17 ENCOUNTER — Telehealth: Payer: Self-pay | Admitting: Emergency Medicine

## 2022-03-17 ENCOUNTER — Ambulatory Visit: Payer: Self-pay | Admitting: *Deleted

## 2022-03-17 NOTE — Telephone Encounter (Signed)
Copied from [place] [phone]. Topic: General - Other >> Mar 17, 2022 11:45 AM [place] B wrote: Reason for CRM: Patient called in asking if Dr Raul Del cardiologist for an appt because he can't think straight to call. He wants to get off meds  and look into doing an ablasion

## 2022-03-17 NOTE — Telephone Encounter (Signed)
Call returned to patient. Patient identified by name and date of birth.  Patient stated that he did not call us first, but was returning a call to someone in our office. I tried to identify what was needed and he stated someone from his primary care office handled it.  I explained that I did not see where someone called from our office. Patient replied with an okay and hung up.

## 2022-03-17 NOTE — Telephone Encounter (Signed)
Second attempt to reach pt, left VM to call back

## 2022-03-17 NOTE — Telephone Encounter (Signed)
Message from [place] sent at 03/17/2022 11:44 AM EST  Summary: diagnosed with campylobactriosis   Patient called in states was diagnosed with campylobactriosis after he went to urgent care and he received a call from the health dept about this, and is wondering is he going to get meds for this. Please call back         Chief Complaint: requesting abx for campylobacter  Symptoms: weakness and diarrhea Frequency: 03/11/22 Pertinent Negatives: Patient denies vomiting  Disposition: []$ ED /[]$ Urgent Care (no appt availability in office) / []$ Appointment(In office/virtual)/ []$  [facility]/ []$ Home Care/ []$ Refused Recommended Disposition /[]$ [place] [facility]/ []$  Follow-up with PCP Additional Notes: teams message sent to Cassandra at practice who will notify PCP.  Reason for Disposition  Prescription request for new medicine (not a refill)  Answer Assessment - Initial Assessment Questions 1. NAME of MEDICINE: "What medicine(s) are you calling about?"     antibiotics 2. QUESTION: "What is your question?" (e.g., double dose of medicine, side effect)     Asking for abx for campylobacter  3. PRESCRIBER: "Who prescribed the medicine?" Reason: if prescribed by specialist, call should be referred to that group.     N/a 4. SYMPTOMS: "Do you have any symptoms?" If Yes, ask: "What symptoms are you having?"  "How bad are the symptoms (e.g., mild, moderate, severe)     Hard to walk weakness-diarrhea  Protocols used: Medication Question Call-A-AH

## 2022-03-17 NOTE — Telephone Encounter (Signed)
Summary: diagnosed with campylobactriosis   Patient called in states was diagnosed with campylobactriosis after he went to urgent care and he received a call from the health dept about this, and is wondering is he going to get meds for this. Please call back         Attempted to call patient - left message to call back

## 2022-03-19 ENCOUNTER — Telehealth: Payer: Self-pay | Admitting: *Deleted

## 2022-03-19 ENCOUNTER — Other Ambulatory Visit: Payer: Self-pay | Admitting: Nurse Practitioner

## 2022-03-19 DIAGNOSIS — I4891 Unspecified atrial fibrillation: Secondary | ICD-10-CM

## 2022-03-19 NOTE — Telephone Encounter (Signed)
Pharmacy concern: Digioxin and amiodarone-are current medications from cardiology.  High risk warning:  reaction risk from trazadone  Is this something she wants to prescribe for patient? Please review  [place]: [phone]

## 2022-03-19 NOTE — Telephone Encounter (Signed)
Call placed to patient unable to reach message left on VM.

## 2022-03-19 NOTE — Telephone Encounter (Signed)
[facility] call [facility] at number below. He can continue trazodone. Call patient and let him know to go to lab at his appt. On 04-02-2022 for dig level.

## 2022-03-19 NOTE — Telephone Encounter (Signed)
It looks like he has taken this therapy for some time without issue. However, we may want Cardiology approval. Last visit with them 06/2021 and he was stable.

## 2022-03-20 ENCOUNTER — Other Ambulatory Visit: Payer: Self-pay

## 2022-03-20 NOTE — Telephone Encounter (Signed)
Unable to reach patient by phone to relay results.   Voicemail left to return call.

## 2022-03-20 NOTE — Telephone Encounter (Signed)
Pharmacy pharmacist aware of response and voiced understanding.

## 2022-03-26 ENCOUNTER — Encounter: Payer: Self-pay | Admitting: Physician Assistant

## 2022-03-26 ENCOUNTER — Ambulatory Visit: Payer: Medicaid Other | Attending: Nurse Practitioner | Admitting: Physician Assistant

## 2022-03-26 VITALS — BP 106/86 | HR 75 | Ht 72.0 in | Wt 301.2 lb

## 2022-03-26 DIAGNOSIS — Z09 Encounter for follow-up examination after completed treatment for conditions other than malignant neoplasm: Secondary | ICD-10-CM | POA: Diagnosis not present

## 2022-03-26 DIAGNOSIS — A045 Campylobacter enteritis: Secondary | ICD-10-CM | POA: Diagnosis not present

## 2022-03-26 DIAGNOSIS — G4700 Insomnia, unspecified: Secondary | ICD-10-CM

## 2022-03-26 DIAGNOSIS — I4891 Unspecified atrial fibrillation: Secondary | ICD-10-CM

## 2022-03-26 DIAGNOSIS — I5043 Acute on chronic combined systolic (congestive) and diastolic (congestive) heart failure: Secondary | ICD-10-CM

## 2022-03-26 DIAGNOSIS — R29898 Other symptoms and signs involving the musculoskeletal system: Secondary | ICD-10-CM | POA: Diagnosis not present

## 2022-03-26 DIAGNOSIS — E1165 Type 2 diabetes mellitus with hyperglycemia: Secondary | ICD-10-CM | POA: Diagnosis not present

## 2022-03-26 DIAGNOSIS — I10 Essential (primary) hypertension: Secondary | ICD-10-CM

## 2022-03-26 LAB — GLUCOSE, POCT (MANUAL RESULT ENTRY): POC Glucose: 213 mg/dl — AB (ref 70–99)

## 2022-03-26 LAB — POCT GLYCOSYLATED HEMOGLOBIN (HGB A1C): HbA1c, POC (controlled diabetic range): 7.5 % — AB (ref 0.0–7.0)

## 2022-03-26 MED ORDER — ATORVASTATIN CALCIUM 20 MG PO TABS: 20.0000 mg | ORAL_TABLET | Freq: Every day | ORAL | 0 refills | Status: DC

## 2022-03-26 MED ORDER — TRAZODONE HCL 100 MG PO TABS: 50.0000 mg | ORAL_TABLET | Freq: Every day | ORAL | 1 refills | Status: DC

## 2022-03-26 MED ORDER — APIXABAN 5 MG PO TABS: 5.0000 mg | ORAL_TABLET | Freq: Two times a day (BID) | ORAL | 0 refills | Status: DC

## 2022-03-26 MED ORDER — ALBUTEROL SULFATE HFA 108 (90 BASE) MCG/ACT IN AERS: 2.0000 | INHALATION_SPRAY | RESPIRATORY_TRACT | 0 refills | Status: DC | PRN

## 2022-03-26 MED ORDER — DAPAGLIFLOZIN PROPANEDIOL 10 MG PO TABS: ORAL_TABLET | ORAL | 3 refills | Status: DC

## 2022-03-26 MED ORDER — OZEMPIC (0.25 OR 0.5 MG/DOSE) 2 MG/3ML ~~LOC~~ SOPN: 0.5000 mg | PEN_INJECTOR | SUBCUTANEOUS | 3 refills | Status: DC

## 2022-03-26 MED ORDER — ENTRESTO 49-51 MG PO TABS: ORAL_TABLET | ORAL | 3 refills | Status: DC

## 2022-03-26 MED ORDER — CARVEDILOL 3.125 MG PO TABS: ORAL_TABLET | ORAL | 0 refills | Status: DC

## 2022-03-26 MED ORDER — SPIRONOLACTONE 25 MG PO TABS: 25.0000 mg | ORAL_TABLET | Freq: Every day | ORAL | 1 refills | Status: DC

## 2022-03-26 NOTE — Patient Instructions (Addendum)
Need to go to emergency department You recently had norovirus and campylobacter infection-both but you at risk for this syndrome   Guillain-Barre Syndrome Guillain-Barr syndrome (GBS) is a rare disorder in which the body's disease-fighting system (immune system) attacks the nerves. This causes numbness and tingling. It can eventually develop into a serious condition and, in some cases, cause paralysis. GBS does not spread from person to person (is not contagious). Most people with GBS recover within a few months. However, some people may have muscle weakness that becomes permanent. What are the causes? The exact cause of GBS is not known. In most cases, GBS develops a few days or weeks after you have an infection, such as: A stomach infection caused by Campylobacter bacteria. This type of infection usually causes diarrhea. An infection of the nose, throat, and upper airways (respiratory infection), such as a cold or the flu. A viral infection. Less commonly, GBS may be triggered by: Surgery. A vaccine. What are the signs or symptoms? The most common first symptoms are tingling, numbness, and weakness in the lower legs. Other symptoms may develop over hours, days, or weeks. These may include: Muscle weakness that spreads from the legs to the abdomen and then the arms. Aching or burning pain of the arms and legs. Inability to move the arms, legs, or both (paralysis). Weakness of muscles in the face. Trouble chewing or swallowing. Slurred speech. Difficulty breathing. How is this diagnosed? This condition may be diagnosed based on: Your symptoms and medical history. Your health care provider will ask about any recent infections you have had. A physical exam. A test that measures how quickly your nerves carry signals to your muscles (nerve conduction velocity test) and a test that measures how well the nerves and muscles communicate (electromyogram). Testing a sample of fluid that surrounds  the spinal cord (lumbar puncture). How is this treated? If your symptoms are mild, you may be given medicines to control your symptoms. If your condition becomes severe, you may need to be treated at a hospital. At the hospital, treatment may include: Medicines. These help to improve numbness or tingling sensations in your arms and legs (paresthesia) caused by irritation of the nerves. Plasma exchange (plasmapheresis). This is a procedure in which the antibodies that are attacking your nerves are removed from your blood. Getting proteins (immunoglobulins or antibodies) that slow down the immune system's attack on your nerves. These may be given through an IV line inserted into one of your veins. Oxygen and breathing assistance. Treatment may also include physical therapy to help strengthen your muscles. After being treated in the hospital, you may be transferred to a rehabilitation center to get intensive physical therapy. Once your strength improves, you may continue to get physical therapy at home. Additional physical therapy may be needed for many months. Follow these instructions at home:  If physical therapy was prescribed, do exercises as told by your health care provider. Make sure you have the support you need. Someone may need to help you bathe, dress, and prepare meals until you regain your strength. Rest as needed. Return to your normal activities as told by your health care provider. Ask your health care provider what activities are safe for you. Take over-the-counter and prescription medicines only as told by your health care provider. Keep all follow-up visits. This is important. Where to find more information GBS/CIDP [employer]: www.gbs-cidp.org Contact a health care provider if you: Have new symptoms or your symptoms get worse. Do not feel  like you are getting better or regaining strength. Do not feel safe or supported at home. Are having trouble coping with your  recovery. Get help right away if you: Have trouble breathing or chest pain. Have trouble swallowing. Choke after eating or drinking. Develop a fever. Cannot move. Faint or feel dizzy. Have pain, swelling, warmth, or redness in an arm or leg. These symptoms may be an emergency. Get help right away. Call 911. Do not wait to see if the symptoms will go away. Do not drive yourself to the hospital. Summary Guillain-Barr syndrome (GBS) is a rare disorder in which the body's immune system attacks the nerves. GBS often requires treatment at a hospital. Treatment in the hospital may include medicines, plasmapheresis, getting proteins that slow down the immune system's attack on your nerves, or oxygen and breathing assistance. Months of physical therapy may be needed until you regain your strength. This information is not intended to replace advice given to you by your health care provider. Make sure you discuss any questions you have with your health care provider. Document Revised: 08/06/2020 Document Reviewed: 08/06/2020 Elsevier Patient Education  [place].

## 2022-03-26 NOTE — Progress Notes (Signed)
Patient ID: Donald [name], male   DOB: 07-Jun-1960, 62 y.o.   MRN: [id number]     Donald [name], is a 62 y.o. male  [id number]  [id number]  DOB - 1960/12/10  Chief Complaint  Patient presents with   trouble walking       Subjective:   Donald [name] is a 62 y.o. male here today for after being seen at the ED twice in the last month for norovirus then campylobacter.  Today his GI symptoms have gone away.  However, today he is complaining of progressive weakness of his B lower extremities.  This has been getting worse over the last 1-2 weeks.  He says he feels like he is in slow motion or on a delay when he is getting out of the car or trying to stand/walk.  He is staying with his aunt bc he is not feeling right.  He says his appetite is good and he is drinking a lot of water.  B knees are aching also.  Denies CP/SOB/dizziness.  Has not seen cardiology in a while.  Still on digoxin.  He is willing to go directly from here to the hospital and have his aunt drive.     No problems updated.  ALLERGIES: No Known Allergies  PAST MEDICAL HISTORY: Past Medical History:  Diagnosis Date   Arrhythmia    A fib    Atrial fibrillation (HCC)    CHF (congestive heart failure) (HCC)    COPD (chronic obstructive pulmonary disease) ([place])    Diabetes mellitus without complication ([place])    Hypertension    Sleep apnea    Stroke [facility])     MEDICATIONS AT HOME: Prior to Admission medications   Medication Sig Start Date End Date Taking? Authorizing Provider  Accu-Chek Softclix Lancets lancets Use as directed 08/15/20  Yes Alma Friendly, MD  albuterol (PROVENTIL) (2.5 MG/3ML) 0.083% nebulizer solution Take 3 mLs (2.5 mg total) by nebulization every 4 (four) hours as needed for wheezing or shortness of breath. 10/07/15  Yes Malvin Johns, MD  albuterol (VENTOLIN HFA) 108 (90 Base) MCG/ACT inhaler Inhale 2 puffs into the lungs every 4 (four) hours as needed for shortness of breath. 07/14/21  01/30/29 Yes Gildardo Pounds, NP  amiodarone (PACERONE) 200 MG tablet TAKE 1 TABLET (200 MG TOTAL) BY MOUTH DAILY (AM) Patient taking differently: Take 200 mg by mouth daily. 09/03/21  Yes Bensimhon, Shaune Pascal, MD  ammonium lactate (AMLACTIN) 12 % lotion Apply 1 Application topically as needed for dry skin. 01/01/22  Yes Trula Slade, DPM  benzonatate (TESSALON) 100 MG capsule Take 1 capsule (100 mg total) by mouth every 8 (eight) hours. 01/29/22  Yes Flossie Dibble, NP  Blood Glucose Monitoring Suppl (BLOOD GLUCOSE MONITOR SYSTEM) w/Device KIT USE AS DIRECTED 08/15/20  Yes Alma Friendly, MD  Blood Pressure Monitor DEVI Please provide patient with insurance approved blood pressure monitor ICD 10  I10 07/14/21  Yes Gildardo Pounds, NP  ciclopirox (PENLAC) 8 % solution Apply topically at bedtime. Apply over nail and surrounding skin. Apply daily over previous coat. After seven (7) days, may remove with alcohol and continue cycle. 01/01/22  Yes Trula Slade, DPM  digoxin (LANOXIN) 0.125 MG tablet Take 0.5 tablets (0.0625 mg total) by mouth daily. NEEDS FOLLOW UP APPOINTMENT FOR ANYMORE REFILLS 01/30/22  Yes Doniphan, [place], FNP  loperamide (IMODIUM) 2 MG capsule Take 1 capsule (2 mg total) by mouth 4 (four) times daily as needed for  diarrhea or loose stools. 03/07/22  Yes Allwardt, Alyssa M, PA-C  mometasone-formoterol (DULERA) 100-5 MCG/ACT AERO Inhale 1 puff into the lungs daily.   Yes [provider]  torsemide (DEMADEX) 20 MG tablet TAKE 1 TABLET BY MOUTH 2 TIMES DAILY 03/02/22  Yes Bensimhon, Shaune Pascal, MD  albuterol (VENTOLIN HFA) 108 (90 Base) MCG/ACT inhaler Inhale 2 puffs into the lungs every 4 (four) hours as needed for wheezing or shortness of breath. 03/26/22   Thereasa Solo, Dionne Bucy, PA-C  apixaban (ELIQUIS) 5 MG TABS tablet Take 1 tablet (5 mg total) by mouth 2 (two) times daily. 03/26/22   Argentina Donovan, PA-C  atorvastatin (LIPITOR) 20 MG tablet Take 1 tablet (20 mg total)  by mouth daily. 03/26/22   Argentina Donovan, PA-C  carvedilol (COREG) 3.125 MG tablet TAKE 1 TABLET BY MOUTH 2 TIMES DAILY 03/26/22   Freeman Caldron M, PA-C  dapagliflozin propanediol (FARXIGA) 10 MG TABS tablet TAKE 1 TABLET BY MOUTH EVERY MORNING BEFORE BREAKFAST 03/26/22   Braxten Memmer, Levada Dy M, PA-C  sacubitril-valsartan (ENTRESTO) 49-51 MG TAKE 1 TABLET BY MOUTH 2 (TWO) TIMES DAILY. (AM+EVENING) 03/26/22   Argentina Donovan, PA-C  Semaglutide,0.25 or 0.'5MG'$ /DOS, (OZEMPIC, 0.25 OR 0.5 MG/DOSE,) 2 MG/3ML SOPN Inject 0.5 mg into the skin once a week. 03/26/22   Argentina Donovan, PA-C  spironolactone (ALDACTONE) 25 MG tablet Take 1 tablet (25 mg total) by mouth daily. 03/26/22   Argentina Donovan, PA-C  traZODone (DESYREL) 100 MG tablet Take 0.5-1 tablets (50-100 mg total) by mouth at bedtime. 03/26/22   Loveda Colaizzi, Dionne Bucy, PA-C    ROS: Neg HEENT Neg resp Neg cardiac Neg GI Neg GU Neg psych Neg neuro  Objective:   Vitals:   03/26/22 1604  BP: 106/86  Pulse: 75  SpO2: 92%  Weight: (!) 301 lb 3.2 oz (136.6 kg)  Height: 6' (1.829 m)   Exam General appearance : Awake, alert, not in any distress. Speech  slow but Clear. Not toxic looking. Soft spoken HEENT: Atraumatic and Normocephalic Neck: Supple, no JVD. No cervical lymphadenopathy.  Chest: Good air entry bilaterally, CTAB.  No rales/rhonchi/wheezing CVS: S1 S2 regular, no murmurs.  He is slow to stand and get moving but then is able to ambulate without holding onto anything Extremities: B/L Lower Ext shows no edema, both legs are warm to touch Neurology: Awake alert, and oriented X 3, CN II-XII intact, Non focal some facial asymmetry form previous stroke Skin: No Rash  Data Review Lab Results  Component Value Date   HGBA1C 7.5 (A) 03/26/2022   HGBA1C 6.8 (H) 12/25/2021   HGBA1C 7.3 (A) 03/17/2021    Assessment & Plan   1. Weakness of both lower extremities Concern for Guillain-Barre Syndrome given recent norovirus and recent  campylobacter.  I recommended 911 which he declined.  He is willing to have his Aunt drive him immediately to the ED for work up.  I have highlighted instructions on his AVS and he verbalized understanding.  2. Hospital discharge follow-up See #1.  GI symptoms resolved-appetite is good.  BMs back to baseline  3. Type 2 diabetes mellitus with hyperglycemia, without long-term current use of insulin (HCC) Uncontrolled-he wants to work on diet - Glucose (CBG) - HgB A1c - atorvastatin (LIPITOR) 20 MG tablet; Take 1 tablet (20 mg total) by mouth daily.  Dispense: 30 tablet; Refill: 0 - Semaglutide,0.25 or 0.'5MG'$ /DOS, (OZEMPIC, 0.25 OR 0.5 MG/DOSE,) 2 MG/3ML SOPN; Inject 0.5 mg into the skin once a  week.  Dispense: 3 mL; Refill: 3  4. Campylobacter diarrhea Resolved but concern for #1  5. Acute on chronic combined systolic and diastolic CHF (congestive heart failure) (HCC) - carvedilol (COREG) 3.125 MG tablet; TAKE 1 TABLET BY MOUTH 2 TIMES DAILY  Dispense: 90 tablet; Refill: 0 - apixaban (ELIQUIS) 5 MG TABS tablet; Take 1 tablet (5 mg total) by mouth 2 (two) times daily.  Dispense: 60 tablet; Refill: 0 - spironolactone (ALDACTONE) 25 MG tablet; Take 1 tablet (25 mg total) by mouth daily.  Dispense: 30 tablet; Refill: 1 - sacubitril-valsartan (ENTRESTO) 49-51 MG; TAKE 1 TABLET BY MOUTH 2 (TWO) TIMES DAILY. (AM+EVENING)  Dispense: 60 tablet; Refill: 3 - Ambulatory referral to Cardiology  6. Essential hypertension controlled - sacubitril-valsartan (ENTRESTO) 49-51 MG; TAKE 1 TABLET BY MOUTH 2 (TWO) TIMES DAILY. (AM+EVENING)  Dispense: 60 tablet; Refill: 3  7. Insomnia, unspecified type - traZODone (DESYREL) 100 MG tablet; Take 0.5-1 tablets (50-100 mg total) by mouth at bedtime.  Dispense: 90 tablet; Refill: 1  8. Atrial fibrillation with RVR (HCC) - apixaban (ELIQUIS) 5 MG TABS tablet; Take 1 tablet (5 mg total) by mouth 2 (two) times daily.  Dispense: 60 tablet; Refill: 0 - Ambulatory referral  to Cardiology(on Dig)    Return in about 2 years (around 03/25/2024) for PCP for chronic conditions.  The patient was given clear instructions to go to ER or return to medical center if symptoms don't improve, worsen or new problems develop. The patient verbalized understanding. The patient was told to call to get lab results if they haven't heard anything in the next week.      Freeman Caldron, PA-C [facility] [place], [place]   03/26/2022, 4:37 PM

## 2022-03-31 ENCOUNTER — Ambulatory Visit: Payer: Medicaid Other | Attending: Student | Admitting: Student

## 2022-03-31 ENCOUNTER — Encounter: Payer: Self-pay | Admitting: Student

## 2022-03-31 VITALS — BP 113/77 | HR 58 | Ht 72.0 in | Wt 303.4 lb

## 2022-03-31 DIAGNOSIS — I4891 Unspecified atrial fibrillation: Secondary | ICD-10-CM

## 2022-03-31 DIAGNOSIS — G4733 Obstructive sleep apnea (adult) (pediatric): Secondary | ICD-10-CM | POA: Diagnosis not present

## 2022-03-31 DIAGNOSIS — I484 Atypical atrial flutter: Secondary | ICD-10-CM | POA: Diagnosis not present

## 2022-03-31 DIAGNOSIS — I2699 Other pulmonary embolism without acute cor pulmonale: Secondary | ICD-10-CM

## 2022-03-31 DIAGNOSIS — I5022 Chronic systolic (congestive) heart failure: Secondary | ICD-10-CM

## 2022-03-31 DIAGNOSIS — I10 Essential (primary) hypertension: Secondary | ICD-10-CM

## 2022-03-31 MED ORDER — AMIODARONE HCL 200 MG PO TABS: 200.0000 mg | ORAL_TABLET | Freq: Two times a day (BID) | ORAL | 0 refills | Status: AC

## 2022-03-31 NOTE — Progress Notes (Addendum)
  Cardiology Office Note:   Date:  03/31/2022  ID:  Donald [name], DOB 1960/12/16, MRN [id number]  Primary Cardiologist: None Electrophysiologist: Vickie Epley, MD   History of Present Illness:   Donald [name] is a 62 y.o. 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    Pt seen by PCP 03/26/2022 with progressive BLE weakness, worse over several weeks. There was concern of Guillain-Barre Syndrome given recent norovirus and campylobacter.  Pt denied CP/SOB/dizziness. He was encouraged to have close cardiology follow up due to length from last follow up.  He was also recommended to proceed to the hospital for further evaluation of his weakness.   Patient reports doing about the same, slightly worse in regards to his weakness since that visit.  He has not sought care, as he did not want to miss his cardiology appointment. He has not had further GI symptoms. He denies CP/SOB/dizziness or syncope. He denies peripheral edema. He has lost his job as he could not function with his BLE weakness and kept getting "written up", so he quit.   Review of systems complete and found to be negative unless listed in HPI.    Studies Reviewed:    EKG is ordered today. Personal review shows AF RVR at 119 bpm,  Risk Assessment/Calculations:    CHA2DS2-VASc Score = 5    Physical Exam:   VS:  BP 113/77   Pulse (!) 58   Ht 6' (1.829 m)   Wt (!) 303 lb 6.4 oz (137.6 kg)   SpO2 95%   BMI 41.15 kg/m    Wt Readings from Last 3 Encounters:  03/31/22 (!) 303 lb 6.4 oz (137.6 kg)  03/26/22 (!) 301 lb 3.2 oz (136.6 kg)  01/29/22 287 lb 14.7 oz (130.6 kg)     GEN: Well nourished, well developed in no acute distress NECK: No JVD; No carotid bruits CARDIAC: irregularly irregular, no murmurs, rubs, gallops RESPIRATORY:  Clear to auscultation without rales, wheezing or rhonchi  ABDOMEN: Soft, non-tender, non-distended EXTREMITIES:  No edema;  No deformity   ASSESSMENT AND PLAN:   Paroxysmal atrial fibrillation (HCC) Atypical atrial flutter (HCC) H/o prior flutter ablation at [facility] EKG today shows AF with RVR at 119 after walking into clinic. Rates improved at rest.  He has previously been tentatively interested in repeat ablation; which per Dr. [name] notes would include a PVI, posterior wall ablation and CTI.   Increase amiodarone 200 mg BID for now.  I suspect this exacerbation is in the setting of his other issues.    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  Exercising regularly.   Continue coreg 3.125 mg BID Continue Entresto 49/51 mg BID Continue spironolactone 25 mg daily   Pulmonary embolism without acute cor pulmonale, unspecified chronicity, unspecified pulmonary embolism type (HCC) Continue OAC  Progressive BLE weakness Highly concerning in setting of recent Campylobacter infection.  I have re-iterated his PCP recommendation for urgent presentation to an emergency room for work up. He states [facility].   Follow up with EP APP in 2 weeks. If he remains in AF would plan cardioversion if his other issues have resolved. Otherwise, his odds of maintaining NSR are very low.   Labs deferred today as pt plans to present to ED.  Signed, Shirley Friar, PA-C

## 2022-03-31 NOTE — Patient Instructions (Signed)
Medication Instructions:  1.Increase amiodarone to 200 mg twice a day *If you need a refill on your cardiac medications before your next appointment, please call your pharmacy*   Lab Work: None If you have labs (blood work) drawn today and your tests are completely normal, you will receive your results only by: [place] (if you have MyChart) OR A paper copy in the mail If you have any lab test that is abnormal or we need to change your treatment, we will call you to review the results.   Follow-Up: At [facility], you and your health needs are our priority.  As part of our continuing mission to provide you with exceptional heart care, we have created designated Provider Care Teams.  These Care Teams include your primary Cardiologist (physician) and Advanced Practice Providers (APPs -  Physician Assistants and Nurse Practitioners) who all work together to provide you with the care you need, when you need it.  Your next appointment:   2 week(s)  Provider:   Legrand Como "Oda Kilts, PA-C

## 2022-04-01 ENCOUNTER — Other Ambulatory Visit (HOSPITAL_COMMUNITY): Payer: Self-pay | Admitting: Internal Medicine

## 2022-04-02 ENCOUNTER — Ambulatory Visit: Payer: Medicaid Other | Admitting: Physician Assistant

## 2022-04-03 ENCOUNTER — Other Ambulatory Visit: Payer: Self-pay

## 2022-04-03 ENCOUNTER — Ambulatory Visit: Payer: Medicaid Other | Attending: Nurse Practitioner | Admitting: Nurse Practitioner

## 2022-04-03 ENCOUNTER — Encounter: Payer: Self-pay | Admitting: Nurse Practitioner

## 2022-04-03 VITALS — BP 124/84 | HR 103 | Temp 98.1°F | Ht 72.0 in | Wt 309.4 lb

## 2022-04-03 DIAGNOSIS — E1165 Type 2 diabetes mellitus with hyperglycemia: Secondary | ICD-10-CM | POA: Diagnosis not present

## 2022-04-03 DIAGNOSIS — G4733 Obstructive sleep apnea (adult) (pediatric): Secondary | ICD-10-CM

## 2022-04-03 DIAGNOSIS — Z09 Encounter for follow-up examination after completed treatment for conditions other than malignant neoplasm: Secondary | ICD-10-CM | POA: Diagnosis not present

## 2022-04-03 DIAGNOSIS — M170 Bilateral primary osteoarthritis of knee: Secondary | ICD-10-CM | POA: Diagnosis not present

## 2022-04-03 DIAGNOSIS — Z1211 Encounter for screening for malignant neoplasm of colon: Secondary | ICD-10-CM | POA: Diagnosis not present

## 2022-04-03 MED ORDER — TRAMADOL HCL 50 MG PO TABS
50.0000 mg | ORAL_TABLET | Freq: Three times a day (TID) | ORAL | 0 refills | Status: DC | PRN
  Filled 2022-04-03: qty 30, 10d supply, fill #0
  Filled 2022-04-07: qty 15, 5d supply, fill #0

## 2022-04-03 NOTE — Progress Notes (Unsigned)
Assessment & Plan:  Donald [name] was seen today for hospitalization follow-up.  Diagnoses and all orders for this visit:  Type 2 diabetes mellitus with hyperglycemia, without long-term current use of insulin ([place]) -     Ambulatory referral to Ophthalmology  Colon cancer screening -     Ambulatory referral to Gastroenterology  OSA (obstructive sleep apnea) -     Split night study; Future  Primary osteoarthritis of both knees -     traMADol (ULTRAM) 50 MG tablet; Take 1 tablet (50 mg total) by mouth every 8 (eight) hours as needed.    Patient has been counseled on age-appropriate routine health concerns for screening and prevention. These are reviewed and up-to-date. Referrals have been placed accordingly. Immunizations are up-to-date or declined.    Subjective:   Chief Complaint  Patient presents with   Hospitalization Follow-up   HPI Donald [name] 62 y.o. male presents to office today to for HFU   PMH: AFIB, CVA, HTN, COPD, MORBID OBESITY   HFU Donald [name] was sent to the emergency room 03-31-2022 by his cardiologist as he was found to be in afib with RVR. His amiodarone was increase from 200 mg daily to 200 mg BID and carvedilol was switched to metoprolol. He was instructed to continue his entresto and spironolactone and torsemide was held. He is to follow up with Cardiology in a few weeks. He does not have any medications with him today and is unable to confirm all of his medications. Unclear if he is taking the new dosage changes. He continues in afib today based on exam. Asymptomatic.   Endorses bilateral knee pain. States he was given tramadol during his hospital admission and this helped to relieve his pain. We will need xrays of his knees in the near future. States he was told many years ago that he needed knee surgery but he could not afford it at that time.  09-18-21 XRAYS RIGHT: Moderate severe tricompartment arthritis of the knee with probable effusion  LEFT:  Tricompartment arthritis worst involving the medial joint space.   Sleep Apnea: Patient presents with possible obstructive sleep apnea. Patent has a several years history of symptoms of morning fatigue and hypertension. Patient generally gets a few hours of sleep per night, and states they generally have nightime awakenings and difficulty falling back asleep if awakened. Snoring of severe severity is present. Apneic episodes are present. Nasal obstruction is not present.  Patient has not had a tonsillectomy.    A1c 7.5. Not quite at goal. He is taking farxiga  10 mg daily. Not consistently diet adherent    Review of Systems  Constitutional:  Negative for fever, malaise/fatigue and weight loss.  HENT: Negative.  Negative for nosebleeds.   Eyes: Negative.  Negative for blurred vision, double vision and photophobia.  Respiratory: Negative.  Negative for cough and shortness of breath.   Cardiovascular: Negative.  Negative for chest pain, palpitations and leg swelling.  Gastrointestinal: Negative.  Negative for heartburn, nausea and vomiting.  Musculoskeletal:  Positive for joint pain. Negative for myalgias.  Neurological: Negative.  Negative for dizziness, focal weakness, seizures and headaches.  Psychiatric/Behavioral: Negative.  Negative for suicidal ideas.     Past Medical History:  Diagnosis Date   Arrhythmia    A fib    Atrial fibrillation (HCC)    CHF (congestive heart failure) (HCC)    COPD (chronic obstructive pulmonary disease) (HCC)    Diabetes mellitus without complication ([place])    Hypertension  Sleep apnea    Stroke [facility])     Past Surgical History:  Procedure Laterality Date   ABLATION     CARDIOVERSION N/A 03/12/2020   Procedure: CARDIOVERSION;  Surgeon: Skeet Latch, MD;  Location: [place];  Service: Cardiovascular;  Laterality: N/A;   CARDIOVERSION N/A 08/13/2020   Procedure: CARDIOVERSION;  Surgeon: Jolaine Artist, MD;  Location: [facility] ENDOSCOPY;   Service: Cardiovascular;  Laterality: N/A;   CARDIOVERSION N/A 09/02/2020   Procedure: CARDIOVERSION;  Surgeon: Elouise Munroe, MD;  Location: [facility] ENDOSCOPY;  Service: Cardiovascular;  Laterality: N/A;   INCISION AND DRAINAGE PERIRECTAL ABSCESS N/A 10/20/2012   Procedure: IRRIGATION AND DEBRIDEMENT PERIRECTAL ABSCESS;  Surgeon: Imogene Burn. Georgette Dover, MD;  Location: [place];  Service: General;  Laterality: N/A;  Lithotomy   RIGHT/LEFT HEART CATH AND CORONARY ANGIOGRAPHY N/A 08/12/2020   Procedure: RIGHT/LEFT HEART CATH AND CORONARY ANGIOGRAPHY;  Surgeon: Jolaine Artist, MD;  Location: [place] CV LAB;  Service: Cardiovascular;  Laterality: N/A;   TEE WITHOUT CARDIOVERSION N/A 03/12/2020   Procedure: TRANSESOPHAGEAL ECHOCARDIOGRAM (TEE);  Surgeon: Skeet Latch, MD;  Location: [place];  Service: Cardiovascular;  Laterality: N/A;   TEE WITHOUT CARDIOVERSION N/A 08/13/2020   Procedure: TRANSESOPHAGEAL ECHOCARDIOGRAM (TEE);  Surgeon: Jolaine Artist, MD;  Location: [facility] ENDOSCOPY;  Service: Cardiovascular;  Laterality: N/A;    Family History  Problem Relation Age of Onset   Breast cancer Mother    Heart attack Father 40   Heart attack Paternal Uncle 16    Social History Reviewed with no changes to be made today.   Outpatient Medications Prior to Visit  Medication Sig Dispense Refill   Accu-Chek Softclix Lancets lancets Use as directed 100 each 0   albuterol (PROVENTIL) (2.5 MG/3ML) 0.083% nebulizer solution Take 3 mLs (2.5 mg total) by nebulization every 4 (four) hours as needed for wheezing or shortness of breath. 30 vial 0   albuterol (VENTOLIN HFA) 108 (90 Base) MCG/ACT inhaler Inhale 2 puffs into the lungs every 4 (four) hours as needed for shortness of breath. 18 g 1   albuterol (VENTOLIN HFA) 108 (90 Base) MCG/ACT inhaler Inhale 2 puffs into the lungs every 4 (four) hours as needed for wheezing or shortness of breath. 6.7 g 0   amiodarone (PACERONE) 200 MG tablet Take 1 tablet  (200 mg total) by mouth 2 (two) times daily. 180 tablet 0   ammonium lactate (AMLACTIN) 12 % lotion Apply 1 Application topically as needed for dry skin. 400 g 0   apixaban (ELIQUIS) 5 MG TABS tablet Take 1 tablet (5 mg total) by mouth 2 (two) times daily. 60 tablet 0   atorvastatin (LIPITOR) 20 MG tablet Take 1 tablet (20 mg total) by mouth daily. 30 tablet 0   benzonatate (TESSALON) 100 MG capsule Take 1 capsule (100 mg total) by mouth every 8 (eight) hours. 21 capsule 0   Blood Glucose Monitoring Suppl (BLOOD GLUCOSE MONITOR SYSTEM) w/Device KIT USE AS DIRECTED 1 kit 0   Blood Pressure Monitor DEVI Please provide patient with insurance approved blood pressure monitor ICD 10  I10 1 each 0   carvedilol (COREG) 3.125 MG tablet TAKE 1 TABLET BY MOUTH 2 TIMES DAILY 90 tablet 0   ciclopirox (PENLAC) 8 % solution Apply topically at bedtime. Apply over nail and surrounding skin. Apply daily over previous coat. After seven (7) days, may remove with alcohol and continue cycle. 6.6 mL 2   dapagliflozin propanediol (FARXIGA) 10 MG TABS tablet TAKE 1 TABLET BY  MOUTH EVERY MORNING BEFORE BREAKFAST 30 tablet 3   digoxin (LANOXIN) 0.125 MG tablet Take 0.5 tablets (0.0625 mg total) by mouth daily. NEEDS FOLLOW UP APPOINTMENT FOR ANYMORE REFILLS 15 tablet 3   loperamide (IMODIUM) 2 MG capsule Take 1 capsule (2 mg total) by mouth 4 (four) times daily as needed for diarrhea or loose stools. 12 capsule 0   mometasone-formoterol (DULERA) 100-5 MCG/ACT AERO Inhale 1 puff into the lungs daily.     sacubitril-valsartan (ENTRESTO) 49-51 MG TAKE 1 TABLET BY MOUTH 2 (TWO) TIMES DAILY. (AM+EVENING) 60 tablet 3   Semaglutide,0.25 or 0.'5MG'$ /DOS, (OZEMPIC, 0.25 OR 0.5 MG/DOSE,) 2 MG/3ML SOPN Inject 0.5 mg into the skin once a week. 3 mL 3   spironolactone (ALDACTONE) 25 MG tablet Take 1 tablet (25 mg total) by mouth daily. 30 tablet 1   torsemide (DEMADEX) 20 MG tablet TAKE 1 TABLET BY MOUTH 2 TIMES DAILY 60 tablet 0   traZODone  (DESYREL) 100 MG tablet Take 0.5-1 tablets (50-100 mg total) by mouth at bedtime. 90 tablet 1   No facility-administered medications prior to visit.    Allergies  Allergen Reactions   Lisinopril Cough       Objective:    BP 124/84   Pulse (!) 103   Temp 98.1 F (36.7 C) (Oral)   Ht 6' (1.829 m)   Wt (!) 309 lb 6.4 oz (140.3 kg)   SpO2 93%   BMI 41.96 kg/m  Wt Readings from Last 3 Encounters:  04/03/22 (!) 309 lb 6.4 oz (140.3 kg)  03/31/22 (!) 303 lb 6.4 oz (137.6 kg)  03/26/22 (!) 301 lb 3.2 oz (136.6 kg)    Physical Exam Vitals and nursing note reviewed.  Constitutional:      Appearance: He is well-developed.  HENT:     Head: Normocephalic and atraumatic.  Cardiovascular:     Rate and Rhythm: Rhythm irregularly irregular.     Heart sounds: Normal heart sounds. No murmur heard.    No friction rub. No gallop.  Pulmonary:     Effort: Pulmonary effort is normal. No tachypnea or respiratory distress.     Breath sounds: Normal breath sounds. No decreased breath sounds, wheezing, rhonchi or rales.  Chest:     Chest wall: No tenderness.  Abdominal:     General: Bowel sounds are normal.     Palpations: Abdomen is soft.  Musculoskeletal:        General: Normal range of motion.     Cervical back: Normal range of motion.  Skin:    General: Skin is warm and dry.  Neurological:     Mental Status: He is alert and oriented to person, place, and time.     Coordination: Coordination normal.  Psychiatric:        Behavior: Behavior normal. Behavior is cooperative.        Thought Content: Thought content normal.        Judgment: Judgment normal.          Patient has been counseled extensively about nutrition and exercise as well as the importance of adherence with medications and regular follow-up. The patient was given clear instructions to go to ER or return to medical center if symptoms don't improve, worsen or new problems develop. The patient verbalized  understanding.   Follow-up: Return in 3 months (on 07/04/2022).   Gildardo Pounds, FNP-BC [facility] and [facility] [place], [place]   04/04/2022, 8:53 AM

## 2022-04-03 NOTE — Patient Instructions (Signed)
[facility] [place] Address: [address], [place], [place] [address] Phone: [phone] 

## 2022-04-04 ENCOUNTER — Encounter: Payer: Self-pay | Admitting: Nurse Practitioner

## 2022-04-07 ENCOUNTER — Other Ambulatory Visit: Payer: Self-pay

## 2022-04-14 ENCOUNTER — Ambulatory Visit: Payer: Medicaid Other | Admitting: Student

## 2022-04-27 NOTE — Progress Notes (Deleted)
  Electrophysiology Office Note:   Date:  04/27/2022  ID:  Donald [name], DOB [birth year], MRN [id number]  Primary Cardiologist: None Electrophysiologist: Vickie Epley, MD   History of Present Illness:   Donald [name] is a 62 y.o. 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   At last visit was in AF RVR but also c/o progressive BLE weakness which was doubly concerning in setting of recent norovirus and campylobacter infections.  Was seen in ED in [place] and rates improved with med adjustment. BLE weakness has improved and GBS felt unlikely.   Today ***  Since last being seen in our clinic the patient reports doing ***.  he denies chest pain, palpitations, dyspnea, PND, orthopnea, nausea, vomiting, dizziness, syncope, edema, weight gain, or early satiety.   Review of systems complete and found to be negative unless listed in HPI.   Studies Reviewed:    EKG is ordered today. Personal review shows ***  ***  Risk Assessment/Calculations:    CHA2DS2-VASc Score = 5  { No BP recorded.  {Refresh Note OR Click here to enter BP  :1}***        Physical Exam:   VS:  There were no vitals taken for this visit.   Wt Readings from Last 3 Encounters:  04/03/22 (!) 309 lb 6.4 oz (140.3 kg)  03/31/22 (!) 303 lb 6.4 oz (137.6 kg)  03/26/22 (!) 301 lb 3.2 oz (136.6 kg)     GEN: Well nourished, well developed in no acute distress NECK: No JVD; No carotid bruits CARDIAC: {EPRHYTHM:28826}, no murmurs, rubs, gallops RESPIRATORY:  Clear to auscultation without rales, wheezing or rhonchi  ABDOMEN: Soft, non-tender, non-distended EXTREMITIES:  No edema; No deformity   ASSESSMENT AND PLAN:   Paroxysmal atrial fibrillation (HCC) Atypical atrial flutter (HCC) H/o prior flutter ablation at [facility] EKG today shows ***  He has previously been tentatively interested in repeat ablation; which per Dr. [name] notes would  include a PVI, posterior wall ablation and CTI.   Continue amiodarone 200 mg BID *** I suspect this exacerbation is in the setting of his other issues.    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    Progressive BLE weakness Highly concerning in setting of recent Campylobacter infection.  *** I have re-iterated his PCP recommendation for urgent presentation to an emergency room for work up. He states he will go to [place] [facility] today.   {Click here to Review PMH, Prob List, Meds, Allergies, SHx, FHx  :1}   Follow up with [id number] {EPFOLLOW [id number]  Signed, Shirley Friar, PA-C

## 2022-04-28 ENCOUNTER — Ambulatory Visit: Payer: Medicaid Other | Admitting: Student

## 2022-04-28 DIAGNOSIS — I4891 Unspecified atrial fibrillation: Secondary | ICD-10-CM

## 2022-04-28 DIAGNOSIS — I5022 Chronic systolic (congestive) heart failure: Secondary | ICD-10-CM

## 2022-04-28 DIAGNOSIS — I484 Atypical atrial flutter: Secondary | ICD-10-CM

## 2022-04-28 DIAGNOSIS — G4733 Obstructive sleep apnea (adult) (pediatric): Secondary | ICD-10-CM

## 2022-04-28 DIAGNOSIS — I10 Essential (primary) hypertension: Secondary | ICD-10-CM

## 2022-04-28 DIAGNOSIS — I2699 Other pulmonary embolism without acute cor pulmonale: Secondary | ICD-10-CM

## 2022-05-11 NOTE — Progress Notes (Deleted)
  Electrophysiology Office Note:   Date:  05/11/2022  ID:  Donald [name], DOB January 14, 1961, MRN [id number]  Primary Cardiologist: None Electrophysiologist: Lanier Prude, MD   History of Present Illness:   Donald [name] is a 62 y.o. 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   At last visit was in AF RVR but also c/o progressive BLE weakness which was doubly concerning in setting of recent norovirus and campylobacter infections.  Was seen in ED in [place] and rates improved with med adjustment. BLE weakness has improved and GBS felt unlikely.   Today ***  Since last being seen in our clinic the patient reports doing ***.  he denies chest pain, palpitations, dyspnea, PND, orthopnea, nausea, vomiting, dizziness, syncope, edema, weight gain, or early satiety.   Review of systems complete and found to be negative unless listed in HPI.   Studies Reviewed:    EKG is ordered today. Personal review shows ***  ***  Risk Assessment/Calculations:    CHA2DS2-VASc Score = 5  { No BP recorded.  {Refresh Note OR Click here to enter BP  :1}***        Physical Exam:   VS:  There were no vitals taken for this visit.   Wt Readings from Last 3 Encounters:  04/03/22 (!) 309 lb 6.4 oz (140.3 kg)  03/31/22 (!) 303 lb 6.4 oz (137.6 kg)  03/26/22 (!) 301 lb 3.2 oz (136.6 kg)     GEN: Well nourished, well developed in no acute distress NECK: No JVD; No carotid bruits CARDIAC: {EPRHYTHM:28826}, no murmurs, rubs, gallops RESPIRATORY:  Clear to auscultation without rales, wheezing or rhonchi  ABDOMEN: Soft, non-tender, non-distended EXTREMITIES:  No edema; No deformity   ASSESSMENT AND PLAN:   Paroxysmal atrial fibrillation (HCC) Atypical atrial flutter (HCC) H/o prior flutter ablation at [facility] EKG today shows ***  He has previously been tentatively interested in repeat ablation; which per Dr. Geannie Risen notes would  include a PVI, posterior wall ablation and CTI.   Continue amiodarone 200 mg BID *** I suspect this exacerbation is in the setting of his other issues.    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    Progressive BLE weakness Highly concerning in setting of recent Campylobacter infection.  *** I have re-iterated his PCP recommendation for urgent presentation to an emergency room for work up. He states he will go to [place] [facility] today.   {Click here to Review PMH, Prob List, Meds, Allergies, SHx, FHx  :1}   Follow up with {FFMBW:[id number]} {EPFOLLOW DJ:[id number]}  Signed, Graciella Freer, PA-C

## 2022-05-12 ENCOUNTER — Ambulatory Visit: Payer: Medicaid Other | Attending: Student | Admitting: Student

## 2022-05-12 DIAGNOSIS — I4891 Unspecified atrial fibrillation: Secondary | ICD-10-CM

## 2022-05-12 DIAGNOSIS — I484 Atypical atrial flutter: Secondary | ICD-10-CM

## 2022-05-12 DIAGNOSIS — I10 Essential (primary) hypertension: Secondary | ICD-10-CM

## 2022-05-12 DIAGNOSIS — I2699 Other pulmonary embolism without acute cor pulmonale: Secondary | ICD-10-CM

## 2022-05-12 NOTE — Progress Notes (Deleted)
05/12/2022 Donald [name] [id number] 05/05/1960   ASSESSMENT AND PLAN:   Screen for colon cancer   Paroxysmal atrial fibrillation (HCC) On eliquis, being evaluated for ablation.   Chronic obstructive pulmonary disease, unspecified COPD type (HCC) With sleep apnea, not oxygen dependent  Diastolic heart failure EF improved from 20% to 55-60% on 08/06/2021  Aphasia as late effect of cerebrovascular accident (CVA) On eliquis    Patient Care Team: Claiborne Rigg, NP as PCP - General (Nurse Practitioner) Lanier Prude, MD as PCP - Electrophysiology (Cardiology)  HISTORY OF PRESENT ILLNESS: 62 y.o. male referred by Claiborne Rigg, NP, with a past medical history of hypertension, hyperlipidemia, OSA on CPAP, COPD, CVA, atrial fibrillation and atrial flutter with history of submassive bilateral pulmonary embolism continued on Eliquis 5 mg twice daily, chronic combined systolic and diastolic heart failure(Echo 08/06/2021 ejection fraction 55-60 % mild aortic regurgitation, no pulmonary hypertension, no aortic stenosis) and others listed below presents for evaluation of possible colonoscopy.   No family history of colon cancer, does not know his father.  Denies changes in his BM's, can vary with what he eats.  If he eats a lot of salads will have BM 2-3 a day.  No melena, no hematochezia, no diarrhea.  Has GERD if he eats spicy foods, no dysphagia, no nausea, vomiting.   They are planning on doing an ablation, states needs some weight loss.  Has not had any SOB or chest pain with current medications.   03/07/2022 HGB 14.8, WBC 5, PLT 272, Cr 1.29, BUN 9, AST 27, ALT 30, alk phos 56.     Current Medications:   Current Outpatient Medications (Endocrine & Metabolic):    dapagliflozin propanediol (FARXIGA) 10 MG TABS tablet, TAKE 1 TABLET BY MOUTH EVERY MORNING BEFORE BREAKFAST   Semaglutide,0.25 or 0.5MG /DOS, (OZEMPIC, 0.25 OR 0.5 MG/DOSE,) 2 MG/3ML SOPN, Inject 0.5  mg into the skin once a week.  Current Outpatient Medications (Cardiovascular):    amiodarone (PACERONE) 200 MG tablet, Take 1 tablet (200 mg total) by mouth 2 (two) times daily.   atorvastatin (LIPITOR) 20 MG tablet, Take 1 tablet (20 mg total) by mouth daily.   digoxin (LANOXIN) 0.125 MG tablet, Take 0.5 tablets (0.0625 mg total) by mouth daily. NEEDS FOLLOW UP APPOINTMENT FOR ANYMORE REFILLS (Patient taking differently: Take 0.125 mg by mouth daily. NEEDS FOLLOW UP APPOINTMENT FOR ANYMORE REFILLS)   metoprolol succinate (TOPROL-XL) 25 MG 24 hr tablet, Take 25 mg by mouth daily.   sacubitril-valsartan (ENTRESTO) 49-51 MG, TAKE 1 TABLET BY MOUTH 2 (TWO) TIMES DAILY. (AM+EVENING)   spironolactone (ALDACTONE) 25 MG tablet, Take 1 tablet (25 mg total) by mouth daily.   torsemide (DEMADEX) 20 MG tablet, TAKE 1 TABLET BY MOUTH 2 TIMES DAILY  Current Outpatient Medications (Respiratory):    albuterol (PROVENTIL) (2.5 MG/3ML) 0.083% nebulizer solution, Take 3 mLs (2.5 mg total) by nebulization every 4 (four) hours as needed for wheezing or shortness of breath.   albuterol (VENTOLIN HFA) 108 (90 Base) MCG/ACT inhaler, Inhale 2 puffs into the lungs every 4 (four) hours as needed for shortness of breath.   albuterol (VENTOLIN HFA) 108 (90 Base) MCG/ACT inhaler, Inhale 2 puffs into the lungs every 4 (four) hours as needed for wheezing or shortness of breath.   benzonatate (TESSALON) 100 MG capsule, Take 1 capsule (100 mg total) by mouth every 8 (eight) hours.   mometasone-formoterol (DULERA) 100-5 MCG/ACT AERO, Inhale 1 puff into the lungs daily.  Current Outpatient Medications (Analgesics):    traMADol (ULTRAM) 50 MG tablet, Take 1 tablet (50 mg total) by mouth every 8 (eight) hours as needed.  Current Outpatient Medications (Hematological):    apixaban (ELIQUIS) 5 MG TABS tablet, Take 1 tablet (5 mg total) by mouth 2 (two) times daily.  Current Outpatient Medications (Other):    Accu-Chek Softclix  Lancets lancets, Use as directed   ammonium lactate (AMLACTIN) 12 % lotion, Apply 1 Application topically as needed for dry skin.   Blood Glucose Monitoring Suppl (BLOOD GLUCOSE MONITOR SYSTEM) w/Device KIT, USE AS DIRECTED   Blood Pressure Monitor DEVI, Please provide patient with insurance approved blood pressure monitor ICD 10  I10   ciclopirox (PENLAC) 8 % solution, Apply topically at bedtime. Apply over nail and surrounding skin. Apply daily over previous coat. After seven (7) days, may remove with alcohol and continue cycle.   loperamide (IMODIUM) 2 MG capsule, Take 1 capsule (2 mg total) by mouth 4 (four) times daily as needed for diarrhea or loose stools.   traZODone (DESYREL) 100 MG tablet, Take 0.5-1 tablets (50-100 mg total) by mouth at bedtime.  Medical History:  Past Medical History:  Diagnosis Date   Arrhythmia    A fib    Atrial fibrillation (HCC)    CHF (congestive heart failure) (HCC)    COPD (chronic obstructive pulmonary disease) (HCC)    Diabetes mellitus without complication (HCC)    Hypertension    Sleep apnea    Stroke (HCC)    Allergies:  Allergies  Allergen Reactions   Lisinopril Cough     Surgical History:  He  has a past surgical history that includes Incision and drainage perirectal abscess (N/A, 10/20/2012); Ablation; TEE without cardioversion (N/A, 03/12/2020); Cardioversion (N/A, 03/12/2020); RIGHT/LEFT HEART CATH AND CORONARY ANGIOGRAPHY (N/A, 08/12/2020); TEE without cardioversion (N/A, 08/13/2020); Cardioversion (N/A, 08/13/2020); and Cardioversion (N/A, 09/02/2020). Family History:  His family history includes Breast cancer in his mother; Heart attack (age of onset: 31) in his father; Heart attack (age of onset: 88) in his paternal uncle. Social History:   reports that he quit smoking about 6 years ago. His smoking use included cigarettes. He has a 7.50 pack-year smoking history. He has never used smokeless tobacco. He reports that he does not currently use  alcohol. He reports that he does not currently use drugs after having used the following drugs: Cocaine.  REVIEW OF SYSTEMS  : All other systems reviewed and negative except where noted in the History of Present Illness.   PHYSICAL EXAM: There were no vitals taken for this visit. General:   Pleasant, obese AA male in no acute distress Head:  Normocephalic and atraumatic. Eyes: sclerae anicteric,conjunctive pink  Heart:  reg reg with systolic murmur, distant heart sounds Pulm: Clear anteriorly; no wheezing Abdomen:  Soft, Obese AB, skin exam normal, Normal bowel sounds. mild tenderness in the LLQ. Without guarding and Without rebound, without hepatomegaly. Extremities:  Without edema. Msk:  Symmetrical without gross deformities. Peripheral pulses intact.  Neurologic:  Alert and  oriented x4; Aphasia, no focal deficiets Skin:   Dry and intact without significant lesions or rashes. Psychiatric: Demonstrates good judgement and reason without abnormal affect or behaviors.   Doree Albee, PA-C 9:49 AM

## 2022-05-18 ENCOUNTER — Ambulatory Visit: Payer: Medicaid Other | Admitting: Physician Assistant

## 2022-05-25 ENCOUNTER — Ambulatory Visit: Payer: Medicaid Other | Admitting: Nurse Practitioner

## 2022-06-21 ENCOUNTER — Other Ambulatory Visit: Payer: Self-pay | Admitting: Nurse Practitioner

## 2022-06-21 MED ORDER — METFORMIN HCL 500 MG PO TABS: 500.0000 mg | ORAL_TABLET | Freq: Two times a day (BID) | ORAL | 3 refills | Status: DC

## 2022-06-23 ENCOUNTER — Other Ambulatory Visit (HOSPITAL_COMMUNITY): Payer: Self-pay | Admitting: Family Medicine

## 2022-06-23 ENCOUNTER — Other Ambulatory Visit: Payer: Self-pay

## 2022-06-23 ENCOUNTER — Other Ambulatory Visit (HOSPITAL_COMMUNITY): Payer: Self-pay | Admitting: Internal Medicine

## 2022-06-23 DIAGNOSIS — I5043 Acute on chronic combined systolic (congestive) and diastolic (congestive) heart failure: Secondary | ICD-10-CM

## 2022-06-23 DIAGNOSIS — I10 Essential (primary) hypertension: Secondary | ICD-10-CM

## 2022-06-25 ENCOUNTER — Other Ambulatory Visit: Payer: Self-pay | Admitting: Physician Assistant

## 2022-06-25 DIAGNOSIS — I5043 Acute on chronic combined systolic (congestive) and diastolic (congestive) heart failure: Secondary | ICD-10-CM

## 2022-06-25 DIAGNOSIS — I4891 Unspecified atrial fibrillation: Secondary | ICD-10-CM

## 2022-06-25 DIAGNOSIS — E1165 Type 2 diabetes mellitus with hyperglycemia: Secondary | ICD-10-CM

## 2022-06-26 NOTE — Telephone Encounter (Signed)
Requested medications are due for refill today.  Yes  Requested medications are on the active medications list.  yes  Last refill. 03/26/2022 #30 0 rf  Future visit scheduled.   yes  Notes to clinic.  Labs are expired.    Requested Prescriptions  Pending Prescriptions Disp Refills   atorvastatin (LIPITOR) 20 MG tablet [Pharmacy Med Name: atorvastatin 20 mg tablet] 30 tablet 0    Sig: TAKE 1 TABLET BY MOUTH EVERY DAY     Cardiovascular:  Antilipid - Statins Failed - 06/25/2022  2:37 PM      Failed - Lipid Panel in normal range within the last 12 months    Cholesterol  Date Value Ref Range Status  03/08/2020 86 0 - 200 mg/dL Final   LDL Cholesterol  Date Value Ref Range Status  03/08/2020 56 0 - 99 mg/dL Final    Comment:           Total Cholesterol/HDL:CHD Risk Coronary Heart Disease Risk Table                     Men   Women  1/2 Average Risk   3.4   3.3  Average Risk       5.0   4.4  2 X Average Risk   9.6   7.1  3 X Average Risk  23.4   11.0        Use the calculated Patient Ratio above and the CHD Risk Table to determine the patient's CHD Risk.        ATP III CLASSIFICATION (LDL):  <100     mg/dL   Optimal  409-811  mg/dL   Near or Above                    Optimal  130-159  mg/dL   Borderline  914-782  mg/dL   High  >956     mg/dL   Very High Performed at [facility], [address], [place], [place] [address]    HDL  Date Value Ref Range Status  03/08/2020 22 (L) >40 mg/dL Final   Triglycerides  Date Value Ref Range Status  03/08/2020 40 <150 mg/dL Final         Passed - Patient is not pregnant      Passed - Valid encounter within last 12 months    Recent Outpatient Visits           2 months ago Hospital discharge follow-up   [facility] [place], [place] W, NP   3 months ago Type 2 diabetes mellitus with hyperglycemia, without long-term current use of insulin [facility])   [facility] [place], [place], [place]   1 year ago Type 2 diabetes mellitus with hyperglycemia, without long-term current use of insulin [facility])   [place] [facility] [place], [place] W, NP   1 year ago Type 2 diabetes mellitus with hyperglycemia, without long-term current use of insulin [facility])    [facility] [place], Shea Stakes, NP   1 year ago Encounter to establish care   [facility] Claiborne Rigg, NP       Future Appointments             In 1 week Claiborne Rigg, NP [facility]  Signed Prescriptions Disp Refills   ELIQUIS 5 MG TABS tablet 60 tablet 0    Sig: TAKE 1 TABLET BY MOUTH 2 TIMES DAILY     Hematology:  Anticoagulants - apixaban Failed - 06/25/2022  2:37 PM      Failed - Cr in normal range and within 360 days    Creatinine, Ser  Date Value Ref Range Status  03/07/2022 1.29 (H) 0.61 - 1.24 mg/dL Final         Passed - PLT in normal range and within 360 days    Platelets  Date Value Ref Range Status  03/07/2022 272 150 - 400 K/uL Final  07/18/2021 250 150 - 450 x10E3/uL Final         Passed - HGB in normal range and within 360 days    Hemoglobin  Date Value Ref Range Status  03/07/2022 14.8 13.0 - 17.0 g/dL Final  16/10/9602 54.0 13.0 - 17.7 g/dL Final         Passed - HCT in normal range and within 360 days    HCT  Date Value Ref Range Status  03/07/2022 45.4 39.0 - 52.0 % Final   Hematocrit  Date Value Ref Range Status  07/18/2021 45.7 37.5 - 51.0 % Final         Passed - AST in normal range and within 360 days    AST  Date Value Ref Range Status  03/07/2022 27 15 - 41 U/L Final         Passed - ALT in normal range and within 360 days    ALT  Date Value Ref Range Status  03/07/2022 30 0 - 44 U/L Final         Passed - Valid encounter within last 12 months    Recent Outpatient Visits           2 months ago  Hospital discharge follow-up   [facility] [place], Shea Stakes, NP   3 months ago Type 2 diabetes mellitus with hyperglycemia, without long-term current use of insulin [facility])   [facility] [place], [place], [place]   1 year ago Type 2 diabetes mellitus with hyperglycemia, without long-term current use of insulin [facility])   [place] [facility] [facility] [place], [place] W, NP   1 year ago Type 2 diabetes mellitus with hyperglycemia, without long-term current use of insulin [facility])   [place] [facility] [place], Shea Stakes, NP   1 year ago Encounter to establish care   [facility] Claiborne Rigg, NP       Future Appointments             In 1 week Claiborne Rigg, NP [facility]

## 2022-06-26 NOTE — Telephone Encounter (Signed)
Requested Prescriptions  Pending Prescriptions Disp Refills   atorvastatin (LIPITOR) 20 MG tablet [Pharmacy Med Name: atorvastatin 20 mg tablet] 30 tablet 0    Sig: TAKE 1 TABLET BY MOUTH EVERY DAY     Cardiovascular:  Antilipid - Statins Failed - 06/25/2022  2:37 PM      Failed - Lipid Panel in normal range within the last 12 months    Cholesterol  Date Value Ref Range Status  03/08/2020 86 0 - 200 mg/dL Final   LDL Cholesterol  Date Value Ref Range Status  03/08/2020 56 0 - 99 mg/dL Final    Comment:           Total Cholesterol/HDL:CHD Risk Coronary Heart Disease Risk Table                     Men   Women  1/2 Average Risk   3.4   3.3  Average Risk       5.0   4.4  2 X Average Risk   9.6   7.1  3 X Average Risk  23.4   11.0        Use the calculated Patient Ratio above and the CHD Risk Table to determine the patient's CHD Risk.        ATP III CLASSIFICATION (LDL):  <100     mg/dL   Optimal  161-096  mg/dL   Near or Above                    Optimal  130-159  mg/dL   Borderline  045-409  mg/dL   High  >811     mg/dL   Very High Performed at [facility], [address], [place], [place] [address]    HDL  Date Value Ref Range Status  03/08/2020 22 (L) >40 mg/dL Final   Triglycerides  Date Value Ref Range Status  03/08/2020 40 <150 mg/dL Final         Passed - Patient is not pregnant      Passed - Valid encounter within last 12 months    Recent Outpatient Visits           2 months ago Hospital discharge follow-up   [facility] [place], [place] W, NP   3 months ago Type 2 diabetes mellitus with hyperglycemia, without long-term current use of insulin [facility])   [place] [facility] [place], [place], [place]   1 year ago Type 2 diabetes mellitus with hyperglycemia, without long-term current use of insulin [facility])   [place] [facility] [place], [place] W, NP   1 year ago Type 2  diabetes mellitus with hyperglycemia, without long-term current use of insulin [facility])   [place] [facility] [place], Shea Stakes, NP   1 year ago Encounter to establish care   [facility] Claiborne Rigg, NP       Future Appointments             In 1 week Claiborne Rigg, NP [facility]             ELIQUIS 5 MG TABS tablet [Pharmacy Med Name: Eliquis 5 mg tablet] 60 tablet 0    Sig: TAKE 1 TABLET BY MOUTH 2 TIMES DAILY     Hematology:  Anticoagulants - apixaban Failed - 06/25/2022  2:37 PM      Failed - Cr in normal range and within 360 days    Creatinine, Ser  Date Value Ref Range Status  03/07/2022 1.29 (H) 0.61 - 1.24 mg/dL Final         Passed - PLT in normal range and within 360 days    Platelets  Date Value Ref Range Status  03/07/2022 272 150 - 400 K/uL Final  07/18/2021 250 150 - 450 x10E3/uL Final         Passed - HGB in normal range and within 360 days    Hemoglobin  Date Value Ref Range Status  03/07/2022 14.8 13.0 - 17.0 g/dL Final  16/10/9602 54.0 13.0 - 17.7 g/dL Final         Passed - HCT in normal range and within 360 days    HCT  Date Value Ref Range Status  03/07/2022 45.4 39.0 - 52.0 % Final   Hematocrit  Date Value Ref Range Status  07/18/2021 45.7 37.5 - 51.0 % Final         Passed - AST in normal range and within 360 days    AST  Date Value Ref Range Status  03/07/2022 27 15 - 41 U/L Final         Passed - ALT in normal range and within 360 days    ALT  Date Value Ref Range Status  03/07/2022 30 0 - 44 U/L Final         Passed - Valid encounter within last 12 months    Recent Outpatient Visits           2 months ago Hospital discharge follow-up   [facility] [place], [place] W, NP   3 months ago Type 2 diabetes mellitus with hyperglycemia, without long-term current use of insulin [facility])   [place]  [facility] [place], [place], [place]   1 year ago Type 2 diabetes mellitus with hyperglycemia, without long-term current use of insulin [facility])   [place] [facility] [place], [place] W, NP   1 year ago Type 2 diabetes mellitus with hyperglycemia, without long-term current use of insulin [facility])   [place] [facility] [place], Shea Stakes, NP   1 year ago Encounter to establish care   [facility] Claiborne Rigg, NP       Future Appointments             In 1 week Claiborne Rigg, NP [facility]

## 2022-07-08 ENCOUNTER — Ambulatory Visit: Payer: Medicaid Other | Attending: Nurse Practitioner | Admitting: Nurse Practitioner

## 2022-07-08 ENCOUNTER — Encounter: Payer: Self-pay | Admitting: Nurse Practitioner

## 2022-07-08 VITALS — BP 130/89 | HR 105 | Ht 72.0 in | Wt 312.0 lb

## 2022-07-08 DIAGNOSIS — M170 Bilateral primary osteoarthritis of knee: Secondary | ICD-10-CM

## 2022-07-08 DIAGNOSIS — Z7985 Long-term (current) use of injectable non-insulin antidiabetic drugs: Secondary | ICD-10-CM | POA: Diagnosis not present

## 2022-07-08 DIAGNOSIS — I4891 Unspecified atrial fibrillation: Secondary | ICD-10-CM | POA: Diagnosis not present

## 2022-07-08 DIAGNOSIS — D72829 Elevated white blood cell count, unspecified: Secondary | ICD-10-CM | POA: Diagnosis not present

## 2022-07-08 DIAGNOSIS — E1165 Type 2 diabetes mellitus with hyperglycemia: Secondary | ICD-10-CM | POA: Diagnosis not present

## 2022-07-08 LAB — POCT GLYCOSYLATED HEMOGLOBIN (HGB A1C): Hemoglobin A1C: 7.8 % — AB (ref 4.0–5.6)

## 2022-07-08 MED ORDER — OZEMPIC (0.25 OR 0.5 MG/DOSE) 2 MG/3ML ~~LOC~~ SOPN: 0.2500 mg | PEN_INJECTOR | SUBCUTANEOUS | 1 refills | Status: DC

## 2022-07-08 MED ORDER — TRAMADOL HCL 50 MG PO TABS: 50.0000 mg | ORAL_TABLET | Freq: Three times a day (TID) | ORAL | 0 refills | Status: DC | PRN

## 2022-07-08 NOTE — Patient Instructions (Addendum)
[facility] [address] [place], [place] [address] ?PH# [phone] ?

## 2022-07-08 NOTE — Progress Notes (Signed)
Assessment & Plan:  Donald [name] was seen today for diabetes and hypertension.  Diagnoses and all orders for this visit:  Type 2 diabetes mellitus with hyperglycemia, without long-term current use of insulin (HCC) -     POCT glycosylated hemoglobin (Hb A1C) -     Semaglutide,0.25 or 0.5MG /DOS, (OZEMPIC, 0.25 OR 0.5 MG/DOSE,) 2 MG/3ML SOPN; Inject 0.25 mg into the skin once a week. -     CMP14+EGFR -     Microalbumin / creatinine urine ratio Continue blood sugar control as discussed in office today, low carbohydrate diet, and regular physical exercise as tolerated, 150 minutes per week (30 min each day, 5 days per week, or 50 min 3 days per week). Keep blood sugar logs with fasting goal of 90-130 mg/dl, post prandial (after you eat) less than 180.  For Hypoglycemia: BS <60 and Hyperglycemia BS >400; contact the clinic ASAP. Annual eye exams and foot exams are recommended.   Primary osteoarthritis of both knees -     traMADol (ULTRAM) 50 MG tablet; Take 1 tablet (50 mg total) by mouth every 8 (eight) hours as needed.  Leukocytosis, unspecified type -     CBC with Differential  Atrial fibrillation with RVR (HCC) -     Digoxin level  Morbid obesity (HCC) -     Semaglutide,0.25 or 0.5MG /DOS, (OZEMPIC, 0.25 OR 0.5 MG/DOSE,) 2 MG/3ML SOPN; Inject 0.25 mg into the skin once a week. -     Lipid panel    Patient has been counseled on age-appropriate routine health concerns for screening and prevention. These are reviewed and up-to-date. Referrals have been placed accordingly. Immunizations are up-to-date or declined.    Subjective:   Chief Complaint  Patient presents with   Diabetes   Hypertension   HPI Donald [name] 62 y.o. male presents to office today for follow up to DM and HTN   PMH: Afib, aflutter, chronic combined systolic and diastolic heart failure, submassive B/L pulmonary embolism, COPD, hypertension, stroke, hyperlipidemia, OSA on CPAP   He is currently being followed by   Cardiology for afib/flutter. S/p cardioversion 09-02-2020. Currently remains in NSR. Continues on eliquis 5 mg BID.   Blood pressure well controlled. Denies BLE edema, orthopnea or chest pain BP Readings from Last 3 Encounters:  07/08/22 130/89  04/03/22 124/84  03/31/22 113/77    DM 2  A1c not quite at goal. Will increase ozempic today to 0.5 mg weekly. He will continue on metformin 500 mg BID and farxiga 10 mg daily.  Lab Results  Component Value Date   HGBA1C 7.8 (A) 07/08/2022    Lab Results  Component Value Date   HGBA1C 7.8 (A) 07/08/2022     Endorses bilateral knee pain. Tramadol has been helpful. We will need xrays of his knees in the near future. States he was told many years ago that he needed knee surgery but he could not afford it at that time.  09-18-21 XRAYS RIGHT: Moderate severe tricompartment arthritis of the knee with probable effusion  LEFT: Tricompartment arthritis worst involving the medial joint space.    Review of Systems  Constitutional:  Negative for fever, malaise/fatigue and weight loss.  HENT: Negative.  Negative for nosebleeds.   Eyes: Negative.  Negative for blurred vision, double vision and photophobia.  Respiratory: Negative.  Negative for cough and shortness of breath.   Cardiovascular: Negative.  Negative for chest pain, palpitations and leg swelling.  Gastrointestinal: Negative.  Negative for heartburn, nausea and vomiting.  Musculoskeletal:  Positive for joint pain. Negative for myalgias.  Neurological: Negative.  Negative for dizziness, focal weakness, seizures and headaches.  Psychiatric/Behavioral: Negative.  Negative for suicidal ideas.     Past Medical History:  Diagnosis Date   Arrhythmia    A fib    Atrial fibrillation (HCC)    CHF (congestive heart failure) (HCC)    COPD (chronic obstructive pulmonary disease) (HCC)    Diabetes mellitus without complication (HCC)    Hypertension    Sleep apnea    Stroke [facility])     Past  Surgical History:  Procedure Laterality Date   ABLATION     CARDIOVERSION N/A 03/12/2020   Procedure: CARDIOVERSION;  Surgeon: Chilton Si, MD;  [facility] END[facility]COPY;  Service: Cardiovascular;  Laterality: N/A;   CARDIOVERSION N/A 08/13/2020   Procedure: CARDIOVERSION;  Surgeon: Dolores Patty, MD;  [facility] END[facility];  Service: Cardiovascular;  Laterality: N/A;   CARDIOVERSION N/A 09/02/2020   Procedure: CARDIOVERSION;  Surgeon: Parke Poisson, MD;  Location[facility] ENDOSCOPY;  Service: Cardiovascular;  Laterality: N/A;   INCISION AND DRAINAGE PERIRECTAL ABSCESS N/A 10/20/2012   Procedure: IRRIGATION AND DEBRIDEMENT PERIRECTAL ABSCESS;  Surgeon: Wilmon Arms. Corliss Skains, MD;  Location: [facility] OR;  Service: General;  Laterality: N/A;  Lithotomy   RIGHT/LEFT HEART CATH AND CORONARY ANGIOGRAPHY N/A 08/12/2020   Procedure: RIGHT/LEFT HEART CATH AND CORONARY ANGIOGRAPHY;  Surgeon: Dolores Patty, MD;  Location: [facility] INVASIVE CV LAB;  Service: Cardiovascular;  Laterality: N/A;   TEE WITHOUT CARDIOVERSION N/A 03/12/2020   Procedure: TRANSESOPHAGEAL ECHOCARDIOGRAM (TEE);  Surgeon: Chilton Si, MD;  Location[facility] ENDOSCOPY;  Service: Cardiovascular;  Laterality: N/A;   TEE WITHOUT CARDIOVERSION N/A 08/13/2020   Procedure: TRANSESOPHAGEAL ECHOCARDIOGRAM (TEE);  Surgeon: Dolores Patty, MD;  Location[facility] ENDOSCOPY;  Service: Cardiovascular;  Laterality: N/A;    Family History  Problem Relation Age of Onset   Breast cancer Mother    Heart attack Father 38   Heart attack Paternal Uncle 83    Social History Reviewed with no changes to be made today.   Outpatient Medications Prior to Visit  Medication Sig Dispense Refill   Accu-Chek Softclix Lancets lancets Use as directed 100 each 0   albuterol (PROVENTIL) (2.5 MG/3ML) 0.083% nebulizer solution Take 3 mLs (2.5 mg total) by nebulization every 4 (four) hours as needed for wheezing or shortness of breath. 30 vial 0   albuterol (VENTOLIN  HFA) 108 (90 Base) MCG/ACT inhaler Inhale 2 puffs into the lungs every 4 (four) hours as needed for shortness of breath. 18 g 1   albuterol (VENTOLIN HFA) 108 (90 Base) MCG/ACT inhaler Inhale 2 puffs into the lungs every 4 (four) hours as needed for wheezing or shortness of breath. 6.7 g 0   amiodarone (PACERONE) 200 MG tablet Take 1 tablet (200 mg total) by mouth 2 (two) times daily. 180 tablet 0   ammonium lactate (AMLACTIN) 12 % lotion Apply 1 Application topically as needed for dry skin. 400 g 0   atorvastatin (LIPITOR) 20 MG tablet TAKE 1 TABLET BY MOUTH EVERY DAY 30 tablet 0   benzonatate (TESSALON) 100 MG capsule Take 1 capsule (100 mg total) by mouth every 8 (eight) hours. 21 capsule 0   Blood Glucose Monitoring Suppl (BLOOD GLUCOSE MONITOR SYSTEM) w/Device KIT USE AS DIRECTED 1 kit 0   Blood Pressure Monitor DEVI Please provide patient with insurance approved blood pressure monitor ICD 10  I10 1 each 0   ciclopirox (PENLAC) 8 % solution Apply topically  at bedtime. Apply over nail and surrounding skin. Apply daily over previous coat. After seven (7) days, may remove with alcohol and continue cycle. 6.6 mL 2   dapagliflozin propanediol (FARXIGA) 10 MG TABS tablet TAKE 1 TABLET BY MOUTH EVERY MORNING BEFORE BREAKFAST 30 tablet 3   digoxin (LANOXIN) 0.125 MG tablet TAKE 1/2 TABLET BY MOUTH EVERY DAY. NEEDS APPT FOR REFILLS 15 tablet 0   ELIQUIS 5 MG TABS tablet TAKE 1 TABLET BY MOUTH 2 TIMES DAILY 60 tablet 0   loperamide (IMODIUM) 2 MG capsule Take 1 capsule (2 mg total) by mouth 4 (four) times daily as needed for diarrhea or loose stools. 12 capsule 0   metFORMIN (GLUCOPHAGE) 500 MG tablet Take 1 tablet (500 mg total) by mouth 2 (two) times daily with a meal. 180 tablet 3   metoprolol succinate (TOPROL-XL) 25 MG 24 hr tablet Take 25 mg by mouth daily.     mometasone-formoterol (DULERA) 100-5 MCG/ACT AERO Inhale 1 puff into the lungs daily.     sacubitril-valsartan (ENTRESTO) 49-51 MG TAKE 1  TABLET BY MOUTH 2 TIMES DAILY 60 tablet 0   spironolactone (ALDACTONE) 25 MG tablet Take 1 tablet (25 mg total) by mouth daily. 30 tablet 1   torsemide (DEMADEX) 20 MG tablet TAKE 1 TABLET BY MOUTH 2 TIMES DAILY 60 tablet 0   traZODone (DESYREL) 100 MG tablet Take 0.5-1 tablets (50-100 mg total) by mouth at bedtime. 90 tablet 1   Semaglutide,0.25 or 0.5MG /DOS, (OZEMPIC, 0.25 OR 0.5 MG/DOSE,) 2 MG/3ML SOPN Inject 0.5 mg into the skin once a week. 3 mL 3   traMADol (ULTRAM) 50 MG tablet Take 1 tablet (50 mg total) by mouth every 8 (eight) hours as needed. 30 tablet 0   No facility-administered medications prior to visit.    Allergies  Allergen Reactions   Lisinopril Cough       Objective:    BP 130/89   Pulse (!) 105   Ht 6' (1.829 m)   Wt (!) 312 lb (141.5 kg)   SpO2 98%   BMI 42.31 kg/m  Wt Readings from Last 3 Encounters:  07/08/22 (!) 312 lb (141.5 kg)  04/03/22 (!) 309 lb 6.4 oz (140.3 kg)  03/31/22 (!) 303 lb 6.4 oz (137.6 kg)    Physical Exam Vitals and nursing note reviewed.  Constitutional:      Appearance: He is well-developed.  HENT:     Head: Normocephalic and atraumatic.  Cardiovascular:     Rate and Rhythm: Normal rate and regular rhythm.     Heart sounds: Normal heart sounds. No murmur heard.    No friction rub. No gallop.  Pulmonary:     Effort: Pulmonary effort is normal. No tachypnea or respiratory distress.     Breath sounds: Normal breath sounds. No decreased breath sounds, wheezing, rhonchi or rales.  Chest:     Chest wall: No tenderness.  Abdominal:     General: Bowel sounds are normal.     Palpations: Abdomen is soft.  Musculoskeletal:        General: Normal range of motion.     Cervical back: Normal range of motion.  Skin:    General: Skin is warm and dry.  Neurological:     Mental Status: He is alert and oriented to person, place, and time.     Coordination: Coordination normal.  Psychiatric:        Behavior: Behavior normal. Behavior  is cooperative.  Thought Content: Thought content normal.        Judgment: Judgment normal.          Patient has been counseled extensively about nutrition and exercise as well as the importance of adherence with medications and regular follow-up. The patient was given clear instructions to go to ER or return to medical center if symptoms don't improve, worsen or new problems develop. The patient verbalized understanding.   Follow-up: Return in about 3 months (around 10/08/2022).   Claiborne Rigg, FNP-BC [facility] [place], [place] [phone]   07/10/2022, 10:53 PM

## 2022-07-10 ENCOUNTER — Encounter: Payer: Self-pay | Admitting: Nurse Practitioner

## 2022-07-15 NOTE — Progress Notes (Unsigned)
07/16/2022 Donald [name] [id number] 1960/04/15  Primary GI: Dr. Myrtie Neither  ASSESSMENT AND PLAN:   Screen for colon cancer No GI symptoms, no family history Was seen 03/31/21 but had EF 20% with multiple comorbid conditions, patient is s/p ablation with improvement of his rhythm which improved his EF.  Most recent EF 07/2021 55-60%, no AS He walks daily around the block, and up stairs without SOB, CP We have discussed the risks of bleeding, infection, perforation, medication reactions, and remote risk of death associated with colonoscopy. All questions were answered and the patient acknowledges these risk and wishes to proceed.  Paroxysmal atrial fibrillation (HCC) with history of CVA/PE Hold Eliquis for 2 days before procedure  will instruct when and how to resume after procedure.  Patient understands that there is a low but real risk of cardiovascular event such as heart attack, stroke, or embolism /  thrombosis, or ischemia while off Coumadin.  The patient consents to proceed.  Will communicate by phone or EMR with patient's prescribing provider to confirm that holding Coumadin is reasonable in this case.  Chronic systolic congestive heart failure (HCC) S/p ablation with improved EF  08/06/2021 showed improvement of ejection fraction from 20% to 55 to 60%, mild LVH normal RVSP no aortic stenosis.  Aphasia as late effect of cerebrovascular accident (CVA) Patient without aphasia, no visible side effects from CVA  Chronic obstructive pulmonary disease, unspecified COPD type (HCC) With sleep apnea, not oxygen dependent  Patient Care Team: Claiborne Rigg, NP as PCP - General (Nurse Practitioner) Sandy Salaam, MD as Referring Physician (Cardiology)  HISTORY OF PRESENT ILLNESS: 62 y.o. male referred by Claiborne Rigg, NP, with a past medical history of HTN, HLD, OSA on CPAP, COPD, CVA, history of submassive PE,  continued on Eliquis 5 mg twice daily, chronic combined systolic and  diastolic heart failure(TTE Echo 08/13/2020 ejection fraction 20% mild aortic regurgitation, no pulmonary hypertension, no aortic stenosis) and others listed below presents for evaluation of possible colonoscopy.   Patient history of atrial flutter status post ablation at [facility], on amiodarone 200 mg twice daily repeat echocardiogram post ablation with medical management 08/06/2021 showed improvement of ejection fraction from 20% to 55 to 60%, mild LVH normal RVSP no aortic stenosis. Progressive bilateral extremity weakness with recent Campylobacter infection with diarrhea 03/07/2022, weakness has resolved.  04/2022 [facility] status post atrial fibrillation ablation [facility], he prescribes his eliquis.   He is on eliquis for his Afib No family history of colon cancer, does not know his father.  He had a diarrhea illness 03/07/22 with positive campylobacter, other wise denies changes in his BM's, can vary with what he eats.  If he eats a lot of salads will have BM 2-3 a day.  No melena, no hematochezia, no diarrhea.  Has GERD if he eats spicy foods, no dysphagia, no nausea, vomiting.  He walks around the block once a day and he has stairs to his bedroom, denies SOB or chest pain.   04/02/2022 CBC without anemia, normal CRP/sed rate. Normal LFTs  Current Medications:   Current Outpatient Medications (Endocrine & Metabolic):    dapagliflozin propanediol (FARXIGA) 10 MG TABS tablet, TAKE 1 TABLET BY MOUTH EVERY MORNING BEFORE BREAKFAST   metFORMIN (GLUCOPHAGE) 500 MG tablet, Take 1 tablet (500 mg total) by mouth 2 (two) times daily with a meal.   Semaglutide,0.25 or 0.5MG /DOS, (OZEMPIC, 0.25 OR 0.5 MG/DOSE,) 2 MG/3ML SOPN,  Inject 0.25 mg into the skin once a week.  Current Outpatient Medications (Cardiovascular):    amiodarone (PACERONE) 200 MG tablet, Take 1 tablet (200 mg total) by mouth 2 (two) times daily.   atorvastatin (LIPITOR) 20 MG tablet, TAKE 1  TABLET BY MOUTH EVERY DAY   digoxin (LANOXIN) 0.125 MG tablet, TAKE 1/2 TABLET BY MOUTH EVERY DAY. NEEDS APPT FOR REFILLS   metoprolol succinate (TOPROL-XL) 25 MG 24 hr tablet, Take 25 mg by mouth daily.   sacubitril-valsartan (ENTRESTO) 49-51 MG, TAKE 1 TABLET BY MOUTH 2 TIMES DAILY   spironolactone (ALDACTONE) 25 MG tablet, Take 1 tablet (25 mg total) by mouth daily.   torsemide (DEMADEX) 20 MG tablet, TAKE 1 TABLET BY MOUTH 2 TIMES DAILY  Current Outpatient Medications (Respiratory):    albuterol (PROVENTIL) (2.5 MG/3ML) 0.083% nebulizer solution, Take 3 mLs (2.5 mg total) by nebulization every 4 (four) hours as needed for wheezing or shortness of breath.   albuterol (VENTOLIN HFA) 108 (90 Base) MCG/ACT inhaler, Inhale 2 puffs into the lungs every 4 (four) hours as needed for shortness of breath.   albuterol (VENTOLIN HFA) 108 (90 Base) MCG/ACT inhaler, Inhale 2 puffs into the lungs every 4 (four) hours as needed for wheezing or shortness of breath.   benzonatate (TESSALON) 100 MG capsule, Take 1 capsule (100 mg total) by mouth every 8 (eight) hours.   mometasone-formoterol (DULERA) 100-5 MCG/ACT AERO, Inhale 1 puff into the lungs daily.  Current Outpatient Medications (Analgesics):    traMADol (ULTRAM) 50 MG tablet, Take 1 tablet (50 mg total) by mouth every 8 (eight) hours as needed.  Current Outpatient Medications (Hematological):    ELIQUIS 5 MG TABS tablet, TAKE 1 TABLET BY MOUTH 2 TIMES DAILY  Current Outpatient Medications (Other):    Accu-Chek Softclix Lancets lancets, Use as directed   ammonium lactate (AMLACTIN) 12 % lotion, Apply 1 Application topically as needed for dry skin.   Blood Glucose Monitoring Suppl (BLOOD GLUCOSE MONITOR SYSTEM) w/Device KIT, USE AS DIRECTED   Blood Pressure Monitor DEVI, Please provide patient with insurance approved blood pressure monitor ICD 10  I10   ciclopirox (PENLAC) 8 % solution, Apply topically at bedtime. Apply over nail and surrounding  skin. Apply daily over previous coat. After seven (7) days, may remove with alcohol and continue cycle.   loperamide (IMODIUM) 2 MG capsule, Take 1 capsule (2 mg total) by mouth 4 (four) times daily as needed for diarrhea or loose stools.   Na Sulfate-K Sulfate-Mg Sulf 17.5-3.13-1.6 GM/177ML SOLN, Take 1 kit by mouth once for 1 dose.   traZODone (DESYREL) 100 MG tablet, Take 0.5-1 tablets (50-100 mg total) by mouth at bedtime.  Medical History:  Past Medical History:  Diagnosis Date   Arrhythmia    A fib    Atrial fibrillation (HCC)    CHF (congestive heart failure) (HCC)    COPD (chronic obstructive pulmonary disease) (HCC)    Diabetes mellitus without complication (HCC)    Hypertension    Sleep apnea    Stroke (HCC)    Allergies:  Allergies  Allergen Reactions   Lisinopril Cough     Surgical History:  He  has a past surgical history that includes Incision and drainage perirectal abscess (N/A, 10/20/2012); Ablation; TEE without cardioversion (N/A, 03/12/2020); Cardioversion (N/A, 03/12/2020); RIGHT/LEFT HEART CATH AND CORONARY ANGIOGRAPHY (N/A, 08/12/2020); TEE without cardioversion (N/A, 08/13/2020); Cardioversion (N/A, 08/13/2020); and Cardioversion (N/A, 09/02/2020). Family History:  His family history includes Breast cancer in his mother; Heart  attack (age of onset: 67) in his father; Heart attack (age of onset: 33) in his paternal uncle. Social History:   reports that he quit smoking about 6 years ago. His smoking use included cigarettes. He has a 7.50 pack-year smoking history. He has never used smokeless tobacco. He reports that he does not currently use alcohol. He reports that he does not currently use drugs after having used the following drugs: Cocaine.  REVIEW OF SYSTEMS  : All other systems reviewed and negative except where noted in the History of Present Illness.  PHYSICAL EXAM: BP 102/70   Pulse 65   Ht 6\' 1"  (1.854 m)   Wt 295 lb (133.8 kg)   BMI 38.92 kg/m  General:    Pleasant, obese AA male in no acute distress Head:  Normocephalic and atraumatic. Eyes: sclerae anicteric,conjunctive pink  Heart:  reg reg with systolic murmur, distant heart sounds Pulm: Clear anteriorly; no wheezing Abdomen:  Soft, Obese AB, skin exam normal, Normal bowel sounds. mild tenderness in the LLQ. Without guarding and Without rebound, without hepatomegaly. Extremities:  Without edema. Msk:  Symmetrical without gross deformities. Peripheral pulses intact.  Neurologic:  Alert and  oriented x4; Aphasia, no focal deficiets Skin:   Dry and intact without significant lesions or rashes. Psychiatric: Demonstrates good judgement and reason without abnormal affect or behaviors.   Doree Albee, PA-C 2:21 PM

## 2022-07-16 ENCOUNTER — Ambulatory Visit (INDEPENDENT_AMBULATORY_CARE_PROVIDER_SITE_OTHER): Payer: Medicaid Other | Admitting: Physician Assistant

## 2022-07-16 ENCOUNTER — Ambulatory Visit (HOSPITAL_BASED_OUTPATIENT_CLINIC_OR_DEPARTMENT_OTHER): Payer: Medicaid Other | Attending: Nurse Practitioner | Admitting: Internal Medicine

## 2022-07-16 ENCOUNTER — Other Ambulatory Visit: Payer: Self-pay

## 2022-07-16 VITALS — BP 102/70 | HR 65 | Ht 73.0 in | Wt 295.0 lb

## 2022-07-16 DIAGNOSIS — J449 Chronic obstructive pulmonary disease, unspecified: Secondary | ICD-10-CM | POA: Diagnosis not present

## 2022-07-16 DIAGNOSIS — I480 Paroxysmal atrial fibrillation: Secondary | ICD-10-CM

## 2022-07-16 DIAGNOSIS — Z1211 Encounter for screening for malignant neoplasm of colon: Secondary | ICD-10-CM | POA: Diagnosis not present

## 2022-07-16 DIAGNOSIS — G4733 Obstructive sleep apnea (adult) (pediatric): Secondary | ICD-10-CM | POA: Insufficient documentation

## 2022-07-16 MED ORDER — NA SULFATE-K SULFATE-MG SULF 17.5-3.13-1.6 GM/177ML PO SOLN
1.0000 | Freq: Once | ORAL | 0 refills | Status: AC
  Filled 2022-07-16: qty 354, 2d supply, fill #0

## 2022-07-16 NOTE — Progress Notes (Signed)
____________________________________________________________  Attending physician addendum:  Thank you for sending this case to me. I have reviewed the entire note and agree with the plan.  Given the considerable improvement in his cardiovascular condition since last year, he is now appropriate for colorectal cancer screening.  Amada Jupiter, MD  ____________________________________________________________

## 2022-07-16 NOTE — Patient Instructions (Addendum)
You have been scheduled for a colonoscopy. Please follow written instructions given to you at your visit today.  Please pick up your prep supplies at the pharmacy within the next 1-3 days. If you use inhalers (even only as needed), please bring them with you on the day of your procedure. _______________________________________________________  If your blood pressure at your visit was 140/90 or greater, please contact your primary care physician to follow up on this.  _______________________________________________________  If you are age 60 or older, your body mass index should be between 23-30. Your Body mass index is 38.92 kg/m. If this is out of the aforementioned range listed, please consider follow up with your Primary Care Provider.  If you are age 38 or younger, your body mass index should be between 19-25. Your Body mass index is 38.92 kg/m. If this is out of the aformentioned range listed, please consider follow up with your Primary Care Provider.   ________________________________________________________  The [place] [facility] providers would like to encourage you to use [facility] to communicate with providers for non-urgent requests or questions.  Due to long hold times on the telephone, sending your provider a message by [facility] may be a faster and more efficient way to get a response.  Please allow 48 business hours for a response.  Please remember that this is for non-urgent requests.  _______________________________________________________ It was a pleasure to see you today!  Thank you for trusting me with your gastrointestinal care!

## 2022-07-17 ENCOUNTER — Ambulatory Visit: Payer: Medicaid Other | Attending: Nurse Practitioner

## 2022-07-17 DIAGNOSIS — E1165 Type 2 diabetes mellitus with hyperglycemia: Secondary | ICD-10-CM

## 2022-07-17 DIAGNOSIS — I4891 Unspecified atrial fibrillation: Secondary | ICD-10-CM

## 2022-07-17 DIAGNOSIS — D72829 Elevated white blood cell count, unspecified: Secondary | ICD-10-CM

## 2022-07-18 DIAGNOSIS — G4733 Obstructive sleep apnea (adult) (pediatric): Secondary | ICD-10-CM | POA: Diagnosis not present

## 2022-07-18 LAB — CMP14+EGFR
ALT: 14 IU/L (ref 0–44)
AST: 12 IU/L (ref 0–40)
Albumin: 4.2 g/dL (ref 3.8–4.9)
Alkaline Phosphatase: 74 IU/L (ref 44–121)
BUN/Creatinine Ratio: 12 (ref 10–24)
BUN: 14 mg/dL (ref 8–27)
Bilirubin Total: 0.3 mg/dL (ref 0.0–1.2)
CO2: 25 mmol/L (ref 20–29)
Calcium: 9.4 mg/dL (ref 8.6–10.2)
Chloride: 99 mmol/L (ref 96–106)
Creatinine, Ser: 1.15 mg/dL (ref 0.76–1.27)
Globulin, Total: 2.4 g/dL (ref 1.5–4.5)
Glucose: 144 mg/dL — ABNORMAL HIGH (ref 70–99)
Potassium: 4.1 mmol/L (ref 3.5–5.2)
Sodium: 142 mmol/L (ref 134–144)
Total Protein: 6.6 g/dL (ref 6.0–8.5)
eGFR: 73 mL/min/{1.73_m2} (ref >59)

## 2022-07-18 LAB — LIPID PANEL
Chol/HDL Ratio: 3.2 ratio (ref 0.0–5.0)
Cholesterol, Total: 142 mg/dL (ref 100–199)
HDL: 45 mg/dL (ref >39)
LDL Chol Calc (NIH): 74 mg/dL (ref 0–99)
Triglycerides: 130 mg/dL (ref 0–149)
VLDL Cholesterol Cal: 23 mg/dL (ref 5–40)

## 2022-07-18 LAB — CBC WITH DIFFERENTIAL/PLATELET
Basophils Absolute: 0.1 10*3/uL (ref 0.0–0.2)
Basos: 1 %
EOS (ABSOLUTE): 0.2 10*3/uL (ref 0.0–0.4)
Eos: 3 %
Hematocrit: 50.1 % (ref 37.5–51.0)
Hemoglobin: 16.4 g/dL (ref 13.0–17.7)
Immature Grans (Abs): 0 10*3/uL (ref 0.0–0.1)
Immature Granulocytes: 0 %
Lymphocytes Absolute: 1.6 10*3/uL (ref 0.7–3.1)
Lymphs: 23 %
MCH: 27.9 pg (ref 26.6–33.0)
MCHC: 32.7 g/dL (ref 31.5–35.7)
MCV: 85 fL (ref 79–97)
Monocytes Absolute: 0.6 10*3/uL (ref 0.1–0.9)
Monocytes: 9 %
Neutrophils Absolute: 4.2 10*3/uL (ref 1.4–7.0)
Neutrophils: 64 %
Platelets: 210 10*3/uL (ref 150–450)
RBC: 5.87 x10E6/uL — ABNORMAL HIGH (ref 4.14–5.80)
RDW: 14.6 % (ref 11.6–15.4)
WBC: 6.7 10*3/uL (ref 3.4–10.8)

## 2022-07-18 LAB — MICROALBUMIN / CREATININE URINE RATIO
Creatinine, Urine: 146.1 mg/dL
Microalb/Creat Ratio: 7 mg/g creat (ref 0–29)
Microalbumin, Urine: 10.7 ug/mL

## 2022-07-18 NOTE — Procedures (Signed)
Patient Name: Donald [name], Donald [name] Date: 07/16/2022 Gender: Male D.O.B: August 11, 1960 Age (years): 63 Referring Provider: Claiborne Rigg NP Height (inches): 73 Interpreting Physician: Jetty Duhamel MD, ABSM Weight (lbs): 295 RPSGT: Lowry Ram BMI: 39 MRN: [id number] Neck Size: 16.50  CLINICAL INFORMATION Sleep Study Type: Split Night CPAP Indication for sleep study: Congestive Heart Failure, COPD, Diabetes, Hypertension, Obesity, OSA, Re-Evaluation, Snoring Epworth Sleepiness Score: 14  SLEEP STUDY TECHNIQUE As per the AASM Manual for the Scoring of Sleep and Associated Events v2.3 (April 2016) with a hypopnea requiring 4% desaturations.  The channels recorded and monitored were frontal, central and occipital EEG, electrooculogram (EOG), submentalis EMG (chin), nasal and oral airflow, thoracic and abdominal wall motion, anterior tibialis EMG, snore microphone, electrocardiogram, and pulse oximetry. Continuous positive airway pressure (CPAP) was initiated when the patient met split night criteria and was titrated according to treat sleep-disordered breathing.  MEDICATIONS Medications self-administered by patient taken the night of the study : none reported  RESPIRATORY PARAMETERS Diagnostic  Total AHI (/hr): 13.8 RDI (/hr): 15.7 OA Index (/hr): 1.1 CA Index (/hr): 1.1 REM AHI (/hr): 38.3 NREM AHI (/hr): 9.6 Supine AHI (/hr): N/A Non-supine AHI (/hr): 13.8 Min O2 Sat (%): 81.0 Mean O2 (%): 89.3 Time below 88% (min): 50.5   Titration  Optimal Pressure (cm):  AHI at Optimal Pressure (/hr): N/A Min O2 at Optimal Pressure (%): 87.0 Supine % at Optimal (%): N/A Sleep % at Optimal (%): N/A   SLEEP ARCHITECTURE The recording time for the entire night was 457.1 minutes.  During a baseline period of 187.2 minutes, the patient slept for 160.7 minutes in REM and nonREM, yielding a sleep efficiency of 85.8%. Sleep onset after lights out was 10.6 minutes with a REM latency of 62.0  minutes. The patient spent 3.7% of the night in stage N1 sleep, 81.6% in stage N2 sleep, 0.0% in stage N3 and 14.6% in REM.  During the titration period of 264.2 minutes, the patient slept for 43.0 minutes in REM and nonREM, yielding a sleep efficiency of 16.3%. Sleep onset after CPAP initiation was 120.6 minutes with a REM latency of N/A minutes. The patient spent 33.7% of the night in stage N1 sleep, 66.3% in stage N2 sleep, 0.0% in stage N3 and 0% in REM.  CARDIAC DATA The 2 lead EKG demonstrated sinus rhythm. The mean heart rate was 92.8 beats per minute. Other EKG findings include: PVCs.  LEG MOVEMENT DATA The total Periodic Limb Movements of Sleep (PLMS) were 0. The PLMS index was 0.0 .  IMPRESSIONS - Mild obstructive sleep apnea occurred during the diagnostic portion of the study (AHI = 13.8 /hour). - An optimal PAP pressure was not identified because patient was unable to maintain sufficient sleep after application of CPAP. - No significant central sleep apnea occurred during the diagnostic portion of the study (CAI = 1.1/hour). - Mild oxygen desaturation was noted during the diagnostic portion of the study (Min O2 = 81.0%). Minimum O2 saturation on CPAP 10 was 88%. - The patient snored with moderate snoring volume during the diagnostic portion of the study. - EKG findings include PVCs. - Clinically significant periodic limb movements did not occur during sleep.  DIAGNOSIS - Obstructive Sleep Apnea (G47.33)  RECOMMENDATIONS - Consider return for CPAP titration sleep study, or autopap 5-20. - Consider if management for insomnia may also be appropriate. - Sleep hygiene should be reviewed to assess factors that may improve sleep quality. - Weight management and regular exercise should be  initiated or continued.  [Electronically signed] 07/18/2022 01:00 PM  Jetty Duhamel MD, ABSM Diplomate, American Board of Sleep Medicine NPI: [id number]                           Jetty Duhamel Diplomate, American Board of Sleep Medicine  ELECTRONICALLY SIGNED ON:  07/18/2022, 12:53 PM [facility] PH: [phone]   FX: [phone] ACCREDITED BY THE AMERICAN ACADEMY OF SLEEP MEDICINE

## 2022-07-21 ENCOUNTER — Telehealth: Payer: Self-pay

## 2022-07-21 LAB — DIGOXIN LEVEL: Digoxin, Serum: <0.4 ng/mL — ABNORMAL LOW (ref 0.5–0.9)

## 2022-07-21 NOTE — Telephone Encounter (Signed)
Noted  

## 2022-07-21 NOTE — Telephone Encounter (Signed)
Pt given lab results per notes of Z. Meredeth Ide NP  on 07/21/22. Pt verbalized understanding.   Pt asked if his CPAP can get ordered. He was advised that he would need to call PCP to get that ordered. Sleep study has been done

## 2022-07-22 ENCOUNTER — Other Ambulatory Visit: Payer: Self-pay

## 2022-07-27 ENCOUNTER — Telehealth: Payer: Self-pay | Admitting: *Deleted

## 2022-07-27 NOTE — Telephone Encounter (Signed)
Patient returned call for results and notified:  Digoxin level is low. Needs to follow up with cardiology and should be taking digoxin daily as prescribed.   Patient states he will contact his cardiologist for follow up on this.

## 2022-07-27 NOTE — Telephone Encounter (Signed)
Noted  

## 2022-08-05 ENCOUNTER — Telehealth: Payer: Self-pay

## 2022-08-05 NOTE — Telephone Encounter (Signed)
Called patient and left voicemail regarding needing to reschedule procedure due to him missing his cardiologist appointment and needing clearance- next appointment with cardiologist is on 7/30- original procedure date 08/19/2022.

## 2022-08-06 NOTE — Telephone Encounter (Signed)
Inbound call from patient regarding previous note. Patient is now scheduled for colonoscopy on 8/6 after cardiologist appointment.

## 2022-08-17 ENCOUNTER — Other Ambulatory Visit: Payer: Self-pay | Admitting: Nurse Practitioner

## 2022-08-17 DIAGNOSIS — M170 Bilateral primary osteoarthritis of knee: Secondary | ICD-10-CM

## 2022-08-18 MED ORDER — TRAMADOL HCL 50 MG PO TABS
50.0000 mg | ORAL_TABLET | Freq: Three times a day (TID) | ORAL | 0 refills | Status: DC | PRN
Start: 2022-08-18 — End: 2023-02-02

## 2022-08-19 ENCOUNTER — Telehealth: Payer: Self-pay

## 2022-08-19 ENCOUNTER — Other Ambulatory Visit (HOSPITAL_COMMUNITY): Payer: Self-pay | Admitting: Internal Medicine

## 2022-08-19 ENCOUNTER — Other Ambulatory Visit: Payer: Self-pay | Admitting: Nurse Practitioner

## 2022-08-19 ENCOUNTER — Other Ambulatory Visit (HOSPITAL_COMMUNITY): Payer: Self-pay | Admitting: Family Medicine

## 2022-08-19 ENCOUNTER — Encounter: Payer: Medicaid Other | Admitting: Gastroenterology

## 2022-08-19 ENCOUNTER — Other Ambulatory Visit: Payer: Self-pay | Admitting: Physician Assistant

## 2022-08-19 ENCOUNTER — Other Ambulatory Visit: Payer: Self-pay | Admitting: Family Medicine

## 2022-08-19 DIAGNOSIS — I4891 Unspecified atrial fibrillation: Secondary | ICD-10-CM

## 2022-08-19 DIAGNOSIS — I5043 Acute on chronic combined systolic (congestive) and diastolic (congestive) heart failure: Secondary | ICD-10-CM

## 2022-08-19 DIAGNOSIS — E1165 Type 2 diabetes mellitus with hyperglycemia: Secondary | ICD-10-CM

## 2022-08-19 NOTE — Telephone Encounter (Signed)
Patient came in asking for a prescription of Cialis or Viagra if patient needs an appt first he would like a call back.

## 2022-08-20 NOTE — Telephone Encounter (Signed)
CRM created. 

## 2022-08-20 NOTE — Telephone Encounter (Signed)
Return call to patient to schedule appointment unanswered. Voicemail left to return call.

## 2022-09-01 ENCOUNTER — Telehealth: Payer: Self-pay

## 2022-09-01 ENCOUNTER — Encounter: Payer: Medicaid Other | Admitting: Gastroenterology

## 2022-09-01 NOTE — Telephone Encounter (Signed)
Pt showed today for a colonoscopy that was scheduled with Dr. Myrtie Neither but did not have a care partner.  Pt needs to be rescheduled and possibly may need a pre-visit.

## 2022-09-01 NOTE — Telephone Encounter (Signed)
Irving Burton, please see note below. Thanks

## 2022-09-02 ENCOUNTER — Telehealth: Payer: Self-pay

## 2022-09-02 ENCOUNTER — Other Ambulatory Visit: Payer: Self-pay

## 2022-09-02 MED ORDER — NA SULFATE-K SULFATE-MG SULF 17.5-3.13-1.6 GM/177ML PO SOLN: 1.0000 | Freq: Once | ORAL | 0 refills | Status: AC

## 2022-09-02 NOTE — Telephone Encounter (Signed)
[facility] Pre-operative Risk Assessment     Request for surgical clearance:     Endoscopy Procedure  What type of surgery is being performed?     Colonoscopy   When is this surgery scheduled?     10/14/2022  What type of clearance is required ?   Pharmacy  Are there any medications that need to be held prior to surgery and how long? eliquis- 2 days   Practice name and name of physician performing surgery?      [facility]  What is your office phone and fax number?      Phone- [phone]  Fax- [phone]  Anesthesia type (None, local, MAC, general) ?       MAC

## 2022-09-04 ENCOUNTER — Telehealth: Payer: Self-pay

## 2022-09-04 NOTE — Telephone Encounter (Signed)
Not sure why this was routed to Dr. Alvis Lemmings.   Hold eliquis 2 days prior to procedure and day of procedure. Resume eliquis post procedure day 2.

## 2022-09-04 NOTE — Telephone Encounter (Signed)
Called patient to let him know to hold eliquis 2 days prior, patient verbalized understanding.

## 2022-09-16 ENCOUNTER — Other Ambulatory Visit: Payer: Self-pay | Admitting: Physician Assistant

## 2022-09-18 ENCOUNTER — Other Ambulatory Visit (HOSPITAL_COMMUNITY)
Admission: RE | Admit: 2022-09-18 | Discharge: 2022-09-18 | Disposition: A | Discharge status: Home / Self Care | Payer: Medicaid Other | Source: Ambulatory Visit | Attending: Nurse Practitioner | Admitting: Nurse Practitioner

## 2022-09-18 ENCOUNTER — Encounter: Payer: Self-pay | Admitting: Nurse Practitioner

## 2022-09-18 ENCOUNTER — Ambulatory Visit: Payer: Medicaid Other | Admitting: Nurse Practitioner

## 2022-09-18 VITALS — BP 110/77 | HR 88 | Ht 73.0 in | Wt 291.0 lb

## 2022-09-18 DIAGNOSIS — N529 Male erectile dysfunction, unspecified: Secondary | ICD-10-CM

## 2022-09-18 DIAGNOSIS — E119 Type 2 diabetes mellitus without complications: Secondary | ICD-10-CM

## 2022-09-18 DIAGNOSIS — Z7985 Long-term (current) use of injectable non-insulin antidiabetic drugs: Secondary | ICD-10-CM

## 2022-09-18 DIAGNOSIS — K611 Rectal abscess: Secondary | ICD-10-CM

## 2022-09-18 DIAGNOSIS — Z7251 High risk heterosexual behavior: Secondary | ICD-10-CM | POA: Insufficient documentation

## 2022-09-18 DIAGNOSIS — G4733 Obstructive sleep apnea (adult) (pediatric): Secondary | ICD-10-CM | POA: Diagnosis not present

## 2022-09-18 MED ORDER — TADALAFIL 20 MG PO TABS
10.0000 mg | ORAL_TABLET | ORAL | 11 refills | Status: DC | PRN
Start: 2022-09-18 — End: 2023-09-15

## 2022-09-18 NOTE — Progress Notes (Signed)
Assessment & Plan:  Donald [name] was seen today for erectile dysfunction.  Diagnoses and all orders for this visit:  Erectile dysfunction, unspecified erectile dysfunction type -     PSA -     tadalafil (CIALIS) 20 MG tablet; Take 0.5-1 tablets (10-20 mg total) by mouth every other day as needed for erectile dysfunction.  OSA (obstructive sleep apnea) -     For home use only DME continuous positive airway pressure (CPAP)  High risk sexual behavior, unspecified type -     HIV antibody (with reflex) -     Urine cytology ancillary only -     RPR  Perirectal abscess -     Ambulatory referral to General Surgery  Type 2 diabetes mellitus without complication, unspecified whether long term insulin use (HCC) -     Hemoglobin A1c Continue blood sugar control as discussed in office today, low carbohydrate diet, and regular physical exercise as tolerated, 150 minutes per week (30 min each day, 5 days per week, or 50 min 3 days per week). Keep blood sugar logs with fasting goal of 90-130 mg/dl, post prandial (after you eat) less than 180.  For Hypoglycemia: BS <60 and Hyperglycemia BS >400; contact the clinic ASAP. Annual eye exams and foot exams are recommended.     Patient has been counseled on age-appropriate routine health concerns for screening and prevention. These are reviewed and up-to-date. Referrals have been placed accordingly. Immunizations are up-to-date or declined.    Subjective:   Chief Complaint  Patient presents with   Erectile Dysfunction   HPI Donald [name] 62 y.o. male presents to office today with complaints of erectile dysfunction.     PMH: Afib, aflutter, chronic combined systolic and diastolic heart failure, submassive B/L pulmonary embolism, COPD, hypertension, stroke, hyperlipidemia, OSA on CPAP   He is currently being followed by  Cardiology for afib/flutter. S/p cardioversion 09-02-2020. Currently remains in NSR. Continues on eliquis 5 mg BID.    Blood  pressure well controlled. Denies BLE edema, orthopnea or chest pain BP Readings from Last 3 Encounters:  09/18/22 110/77  07/16/22 102/70  07/08/22 130/89     Erectile Dysfunction: Patient complains of erectile dysfunction.  Onset of dysfunction was several months ago and was gradual in onset.  Patient states the nature of difficulty is both attaining and maintaining erection. Full erections occur spontaneously. Partial erections occur with intercourse. Libido is not affected. Risk factors for ED include cardiovascular disease, diabetes mellitus, beta blocker use, and antihypertensive medications.  Previous treatment of ED includes NONE.     He also needs to see general surgery to remove sutures placed in 2014 due to perirectal abscess. He was lost to follow up for suture removal and states he can feel the sutures when he has a bowel movement.   Review of Systems  Constitutional:  Negative for fever, malaise/fatigue and weight loss.  HENT: Negative.  Negative for nosebleeds.   Eyes: Negative.  Negative for blurred vision, double vision and photophobia.  Respiratory: Negative.  Negative for cough and shortness of breath.   Cardiovascular: Negative.  Negative for chest pain, palpitations and leg swelling.  Gastrointestinal: Negative.  Negative for heartburn, nausea and vomiting.  Genitourinary:        Erectile dysfunction  Musculoskeletal: Negative.  Negative for myalgias.  Neurological: Negative.  Negative for dizziness, focal weakness, seizures and headaches.  Psychiatric/Behavioral: Negative.  Negative for suicidal ideas.     Past Medical History:  Diagnosis Date  Arrhythmia    A fib    Atrial fibrillation (HCC)    CHF (congestive heart failure) (HCC)    COPD (chronic obstructive pulmonary disease) (HCC)    Diabetes mellitus without complication (HCC)    Hypertension    Sleep apnea    Stroke [facility])     Past Surgical History:  Procedure Laterality Date   ABLATION      CARDIOVERSION N/A 03/12/2020   Procedure: CARDIOVERSION;  Surgeon: Chilton Si, MD;  Location: [facility] ENDOSCOPY;  Service: Cardiovascular;  Laterality: N/A;   CARDIOVERSION N/A 08/13/2020   Procedure: CARDIOVERSION;  Surgeon: Dolores Patty, MD;  Location: [facility] ENDOSCOPY;  Service: Cardiovascular;  Laterality: N/A;   CARDIOVERSION N/A 09/02/2020   Procedure: CARDIOVERSION;  Surgeon: Parke Poisson, MD;  Location: [facility] ENDOSCOPY;  Service: Cardiovascular;  Laterality: N/A;   INCISION AND DRAINAGE PERIRECTAL ABSCESS N/A 10/20/2012   Procedure: IRRIGATION AND DEBRIDEMENT PERIRECTAL ABSCESS;  Surgeon: Wilmon Arms. Corliss Skains, MD;  Location: [facility] OR;  Service: General;  Laterality: N/A;  Lithotomy   RIGHT/LEFT HEART CATH AND CORONARY ANGIOGRAPHY N/A 08/12/2020   Procedure: RIGHT/LEFT HEART CATH AND CORONARY ANGIOGRAPHY;  Surgeon: Dolores Patty, MD;  Location: [facility] INVASIVE CV LAB;  Service: Cardiovascular;  Laterality: N/A;   TEE WITHOUT CARDIOVERSION N/A 03/12/2020   Procedure: TRANSESOPHAGEAL ECHOCARDIOGRAM (TEE);  Surgeon: Chilton Si, MD;  Location: [facility] ENDOSCOPY;  Service: Cardiovascular;  Laterality: N/A;   TEE WITHOUT CARDIOVERSION N/A 08/13/2020   Procedure: TRANSESOPHAGEAL ECHOCARDIOGRAM (TEE);  Surgeon: Dolores Patty, MD;  Location: [facility] ENDOSCOPY;  Service: Cardiovascular;  Laterality: N/A;    Family History  Problem Relation Age of Onset   Breast cancer Mother    Heart attack Father 25   Heart attack Paternal Uncle 2    Social History Reviewed with no changes to be made today.   Outpatient Medications Prior to Visit  Medication Sig Dispense Refill   Accu-Chek Softclix Lancets lancets Use as directed 100 each 0   albuterol (VENTOLIN HFA) 108 (90 Base) MCG/ACT inhaler Inhale 2 puffs into the lungs every 4 (four) hours as needed for shortness of breath. 18 g 1   albuterol (VENTOLIN HFA) 108 (90 Base) MCG/ACT inhaler Inhale 2 puffs into the lungs every 4 (four) hours as needed for  wheezing or shortness of breath. 6.7 g 0   amiodarone (PACERONE) 200 MG tablet Take 1 tablet (200 mg total) by mouth 2 (two) times daily. 180 tablet 0   apixaban (ELIQUIS) 5 MG TABS tablet TAKE ONE TABLET BY MOUTH TWICE DAILY (AM+PM) 180 tablet 0   atorvastatin (LIPITOR) 20 MG tablet TAKE ONE TABLET BY MOUTH ONCE DAILY (EVENING) 90 tablet 0   Blood Glucose Monitoring Suppl (BLOOD GLUCOSE MONITOR SYSTEM) w/Device KIT USE AS DIRECTED 1 kit 0   Blood Pressure Monitor DEVI Please provide patient with insurance approved blood pressure monitor ICD 10  I10 1 each 0   ciclopirox (PENLAC) 8 % solution Apply topically at bedtime. Apply over nail and surrounding skin. Apply daily over previous coat. After seven (7) days, may remove with alcohol and continue cycle. 6.6 mL 2   digoxin (LANOXIN) 0.125 MG tablet Take 1 tablet (0.125 mg total) by mouth daily. NEEDS FOLLOW UP APPOINTMENT FOR MORE REFILLS 15 tablet 0   FARXIGA 10 MG TABS tablet TAKE ONE TABLET BY MOUTH EVERY MORNING BEFORE BREAKFAST (AM) 30 tablet 0   metFORMIN (GLUCOPHAGE) 500 MG tablet Take 1 tablet (500 mg total) by mouth 2 (two) times daily with  a meal. 180 tablet 3   metoprolol succinate (TOPROL-XL) 25 MG 24 hr tablet Take 25 mg by mouth daily.     sacubitril-valsartan (ENTRESTO) 49-51 MG TAKE 1 TABLET BY MOUTH 2 TIMES DAILY 60 tablet 0   Semaglutide,0.25 or 0.5MG /DOS, (OZEMPIC, 0.25 OR 0.5 MG/DOSE,) 2 MG/3ML SOPN Inject 0.25 mg into the skin once a week. 3 mL 1   spironolactone (ALDACTONE) 25 MG tablet TAKE ONE TABLET BY MOUTH ONCE DAILY (AM) 90 tablet 0   torsemide (DEMADEX) 20 MG tablet Take 1 tablet (20 mg total) by mouth daily. NEEDS FOLLOW UP APPOINTMENT FOR MORE REFILLS 60 tablet 0   traMADol (ULTRAM) 50 MG tablet Take 1 tablet (50 mg total) by mouth every 8 (eight) hours as needed. 30 tablet 0   traZODone (DESYREL) 100 MG tablet Take 0.5-1 tablets (50-100 mg total) by mouth at bedtime. 90 tablet 1   albuterol (PROVENTIL) (2.5 MG/3ML)  0.083% nebulizer solution Take 3 mLs (2.5 mg total) by nebulization every 4 (four) hours as needed for wheezing or shortness of breath. (Patient not taking: Reported on 09/18/2022) 30 vial 0   ammonium lactate (AMLACTIN) 12 % lotion Apply 1 Application topically as needed for dry skin. (Patient not taking: Reported on 09/18/2022) 400 g 0   benzonatate (TESSALON) 100 MG capsule Take 1 capsule (100 mg total) by mouth every 8 (eight) hours. (Patient not taking: Reported on 09/18/2022) 21 capsule 0   loperamide (IMODIUM) 2 MG capsule Take 1 capsule (2 mg total) by mouth 4 (four) times daily as needed for diarrhea or loose stools. (Patient not taking: Reported on 09/18/2022) 12 capsule 0   mometasone-formoterol (DULERA) 100-5 MCG/ACT AERO Inhale 1 puff into the lungs daily. (Patient not taking: Reported on 09/18/2022)     No facility-administered medications prior to visit.    Allergies  Allergen Reactions   Lisinopril Cough       Objective:    BP 110/77 (BP Location: Left Arm, Patient Position: Sitting, Cuff Size: Large)   Pulse 88   Ht 6\' 1"  (1.854 m)   Wt 291 lb (132 kg)   SpO2 94%   BMI 38.39 kg/m  Wt Readings from Last 3 Encounters:  09/18/22 291 lb (132 kg)  07/16/22 295 lb (133.8 kg)  07/16/22 295 lb (133.8 kg)    Physical Exam Vitals and nursing note reviewed.  Constitutional:      Appearance: He is well-developed.  HENT:     Head: Normocephalic and atraumatic.  Cardiovascular:     Rate and Rhythm: Normal rate and regular rhythm.     Heart sounds: Normal heart sounds. No murmur heard.    No friction rub. No gallop.  Pulmonary:     Effort: Pulmonary effort is normal. No tachypnea or respiratory distress.     Breath sounds: Normal breath sounds. No decreased breath sounds, wheezing, rhonchi or rales.  Chest:     Chest wall: No tenderness.  Abdominal:     General: Bowel sounds are normal.     Palpations: Abdomen is soft.  Musculoskeletal:        General: Normal range of  motion.     Cervical back: Normal range of motion.  Skin:    General: Skin is warm and dry.  Neurological:     Mental Status: He is alert and oriented to person, place, and time.     Coordination: Coordination normal.  Psychiatric:        Behavior: Behavior normal. Behavior is cooperative.  Thought Content: Thought content normal.        Judgment: Judgment normal.          Patient has been counseled extensively about nutrition and exercise as well as the importance of adherence with medications and regular follow-up. The patient was given clear instructions to go to ER or return to medical center if symptoms don't improve, worsen or new problems develop. The patient verbalized understanding.   Follow-up: Return in about 3 months (around 12/19/2022) for cancel september appointment.   Claiborne Rigg, FNP-BC [facility] [place], [place] [phone]   09/18/2022, 2:35 PM

## 2022-09-19 LAB — PSA: Prostate Specific Ag, Serum: 1.5 ng/mL (ref 0.0–4.0)

## 2022-09-19 LAB — HEMOGLOBIN A1C
Est. average glucose Bld gHb Est-mCnc: 174 mg/dL
Hgb A1c MFr Bld: 7.7 % — ABNORMAL HIGH (ref 4.8–5.6)

## 2022-09-19 LAB — HIV ANTIBODY (ROUTINE TESTING W REFLEX): HIV Screen 4th Generation wRfx: NONREACTIVE

## 2022-09-19 LAB — RPR: RPR Ser Ql: NONREACTIVE

## 2022-09-21 ENCOUNTER — Ambulatory Visit: Payer: Medicaid Other | Attending: Nurse Practitioner

## 2022-09-22 ENCOUNTER — Ambulatory Visit: Payer: Medicaid Other | Attending: Nurse Practitioner

## 2022-09-22 ENCOUNTER — Telehealth (INDEPENDENT_AMBULATORY_CARE_PROVIDER_SITE_OTHER): Payer: Self-pay | Admitting: Nurse Practitioner

## 2022-09-22 NOTE — Telephone Encounter (Signed)
Copied from CRM [id number]. Topic: Referral - Status >> Sep 22, 2022 11:13 AM Franchot Heidelberg wrote: Reason for CRM: [facility] is out of network, pt will need a new referral for perirectal abscess. Needs new referral in network   Best contact: [phone]

## 2022-09-23 ENCOUNTER — Other Ambulatory Visit: Payer: Self-pay

## 2022-09-23 ENCOUNTER — Other Ambulatory Visit: Payer: Self-pay | Admitting: Nurse Practitioner

## 2022-09-23 DIAGNOSIS — E1165 Type 2 diabetes mellitus with hyperglycemia: Secondary | ICD-10-CM

## 2022-09-23 LAB — URINE CYTOLOGY ANCILLARY ONLY
Chlamydia: NEGATIVE
Comment: NEGATIVE
Comment: NEGATIVE
Comment: NORMAL
Neisseria Gonorrhea: NEGATIVE
Trichomonas: NEGATIVE

## 2022-09-23 MED ORDER — OZEMPIC (0.25 OR 0.5 MG/DOSE) 2 MG/3ML ~~LOC~~ SOPN: 0.5000 mg | PEN_INJECTOR | SUBCUTANEOUS | 1 refills | Status: DC

## 2022-09-24 ENCOUNTER — Other Ambulatory Visit: Payer: Self-pay

## 2022-09-24 ENCOUNTER — Telehealth: Payer: Self-pay

## 2022-09-24 NOTE — Telephone Encounter (Signed)
Order received for CPAP machine.  I called patient to inquire if he has a preference for DME companies and he did not. He was in agreement to sending the order to [employer] [facility].  I told him that they  need to verify his insurance coverage first and they will then contact him with information about any co-pays, pick up , delivery   Order then faxed to [employer] [facility].

## 2022-10-07 NOTE — Telephone Encounter (Signed)
I spoke to [facility] and she said that they will be providing patient's CPAP machine and it is scheduled for delivery tomorrow, 10/08/2022.

## 2022-10-08 ENCOUNTER — Telehealth: Payer: Self-pay

## 2022-10-08 NOTE — Telephone Encounter (Signed)
Copied from CRM [id number]. Topic: General - Inquiry >> Oct 07, 2022  4:17 PM Marlow Baars wrote: Reason for CRM: The patient called in regarding paperwork he left yesterday for his provider to fill out. He states it is from his attorneys office , [employer] in [place]. He wants to know if they call her if she is able to speak with them. Please assist patient further

## 2022-10-08 NOTE — Telephone Encounter (Signed)
Return call to patient unanswered.  Provider Meredeth Ide is out of office and will respond when back in office.

## 2022-10-09 ENCOUNTER — Ambulatory Visit: Payer: Medicaid Other | Admitting: Nurse Practitioner

## 2022-10-10 ENCOUNTER — Encounter: Payer: Self-pay | Admitting: Certified Registered Nurse Anesthetist

## 2022-10-14 ENCOUNTER — Encounter: Payer: Medicaid Other | Admitting: Gastroenterology

## 2022-10-14 ENCOUNTER — Telehealth: Payer: Self-pay | Admitting: Gastroenterology

## 2022-10-14 NOTE — Telephone Encounter (Signed)
Inbound call from patient stating he missed prep doses for today's colonoscopy yesterday evening 9/17 and today 9/18. Patient states he has also not been following the limited diet due to not receiving prep instructions. Patient has been rescheduled for 10/17 and pre visit on 10/3. Please advise, thank you.

## 2022-10-14 NOTE — Telephone Encounter (Signed)
Thank you for the update.  Disappointing waste of a procedure slot.  This patient can be given one chance to reschedule with the dates that you indicated.  Same bowel preparation and preprocedure Eliquis hold instructions as he was to have for today's procedure.  If he cancels or does not show for the rescheduled procedure, he will not be offered another slot.  Donald [name]

## 2022-10-15 ENCOUNTER — Encounter: Payer: Self-pay | Admitting: Nurse Practitioner

## 2022-10-15 ENCOUNTER — Ambulatory Visit: Payer: Medicaid Other | Admitting: Nurse Practitioner

## 2022-10-15 ENCOUNTER — Other Ambulatory Visit (HOSPITAL_COMMUNITY): Payer: Self-pay | Admitting: Internal Medicine

## 2022-10-15 ENCOUNTER — Other Ambulatory Visit (HOSPITAL_COMMUNITY): Payer: Self-pay | Admitting: Family Medicine

## 2022-10-15 ENCOUNTER — Ambulatory Visit: Payer: Medicaid Other | Attending: Nurse Practitioner | Admitting: Nurse Practitioner

## 2022-10-15 ENCOUNTER — Other Ambulatory Visit: Payer: Self-pay | Admitting: Physician Assistant

## 2022-10-15 VITALS — BP 126/89 | HR 78 | Ht 73.0 in | Wt 301.6 lb

## 2022-10-15 DIAGNOSIS — Z0289 Encounter for other administrative examinations: Secondary | ICD-10-CM | POA: Diagnosis not present

## 2022-10-15 DIAGNOSIS — I69320 Aphasia following cerebral infarction: Secondary | ICD-10-CM | POA: Diagnosis not present

## 2022-10-15 DIAGNOSIS — I5043 Acute on chronic combined systolic (congestive) and diastolic (congestive) heart failure: Secondary | ICD-10-CM | POA: Diagnosis not present

## 2022-10-15 DIAGNOSIS — I10 Essential (primary) hypertension: Secondary | ICD-10-CM

## 2022-10-22 NOTE — Progress Notes (Signed)
Assessment & Plan:  Donald [name] was seen today for paperwork.  Diagnoses and all orders for this visit:  Aphasia as late effect of cerebrovascular accident (CVA) Stable  Acute on chronic combined systolic and diastolic CHF (congestive heart failure) (HCC) Follow up with Cardiology  Encounter for completion of form with patient Forms will be completed and faxed for patient.    Patient has been counseled on age-appropriate routine health concerns for screening and prevention. These are reviewed and up-to-date. Referrals have been placed accordingly. Immunizations are up-to-date or declined.    Subjective:   Chief Complaint  Patient presents with   paperwork   HPI Donald [name] 62 y.o. male presents to office today for completion of paperwork for disability determination.    PMH: hypertension, hyperlipidemia, OSA on CPAP, COPD, CVA, atrial fibrillation and atrial flutter with history of submassive bilateral pulmonary embolism continued on Eliquis 5 mg twice daily, chronic combined systolic and diastolic heart failure(TTE Echo 08/13/2020 ejection fraction 20% mild aortic regurgitation    He states due to his stroke and heart failure he is unable to stand for more than a few minutes at at time and is unable to secure gainful employment. His legs are weak and he uses a cane to assist with his mobility. 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 BMI 39.79. He also endorses short term memory loss which occurred after his stroke. He has history of CHF/afib flutter and is followed by cardiology.   Review of Systems  Constitutional:  Positive for malaise/fatigue. Negative for fever and weight loss.  HENT: Negative.  Negative for nosebleeds.   Eyes: Negative.  Negative for blurred vision, double vision and photophobia.  Respiratory: Negative.  Negative for cough and shortness of breath.   Cardiovascular: Negative.  Negative for chest pain,  palpitations and leg swelling.  Gastrointestinal: Negative.  Negative for heartburn, nausea and vomiting.  Musculoskeletal: Negative.  Negative for myalgias.  Neurological:  Positive for weakness. Negative for dizziness, focal weakness, seizures and headaches.  Psychiatric/Behavioral:  Positive for memory loss. Negative for suicidal ideas.     Past Medical History:  Diagnosis Date   Arrhythmia    A fib    Atrial fibrillation (HCC)    CHF (congestive heart failure) (HCC)    COPD (chronic obstructive pulmonary disease) (HCC)    Diabetes mellitus without complication (HCC)    Hypertension    Sleep apnea    Stroke [facility])     Past Surgical History:  Procedure Laterality Date   ABLATION     CARDIOVERSION N/A 03/12/2020   Procedure: CARDIOVERSION;  Surgeon: Chilton Si, MD;  Location: [facility] ENDOSCOPY;  Service: Cardiovascular;  Laterality: N/A;   CARDIOVERSION N/A 08/13/2020   Procedure: CARDIOVERSION;  Surgeon: Dolores Patty, MD;  Location: [facility] ENDOSCOPY;  Service: Cardiovascular;  Laterality: N/A;   CARDIOVERSION N/A 09/02/2020   Procedure: CARDIOVERSION;  Surgeon: Parke Poisson, MD;  Location: [facility] ENDOSCOPY;  Service: Cardiovascular;  Laterality: N/A;   INCISION AND DRAINAGE PERIRECTAL ABSCESS N/A 10/20/2012   Procedure: IRRIGATION AND DEBRIDEMENT PERIRECTAL ABSCESS;  Surgeon: Wilmon Arms. Corliss Skains, MD;  Location: [facility] OR;  Service: General;  Laterality: N/A;  Lithotomy   RIGHT/LEFT HEART CATH AND CORONARY ANGIOGRAPHY N/A 08/12/2020   Procedure: RIGHT/LEFT HEART CATH AND CORONARY ANGIOGRAPHY;  Surgeon: Dolores Patty, MD;  Location: [facility] INVASIVE CV LAB;  Service: Cardiovascular;  Laterality: N/A;   TEE WITHOUT CARDIOVERSION N/A 03/12/2020  Procedure: TRANSESOPHAGEAL ECHOCARDIOGRAM (TEE);  Surgeon: Chilton Si, MD;  Location: [facility] ENDOSCOPY;  Service: Cardiovascular;  Laterality: N/A;   TEE WITHOUT CARDIOVERSION N/A 08/13/2020   Procedure: TRANSESOPHAGEAL ECHOCARDIOGRAM (TEE);   Surgeon: Dolores Patty, MD;  Location: [facility] ENDOSCOPY;  Service: Cardiovascular;  Laterality: N/A;    Family History  Problem Relation Age of Onset   Breast cancer Mother    Heart attack Father 59   Heart attack Paternal Uncle 63    Social History Reviewed with no changes to be made today.   Outpatient Medications Prior to Visit  Medication Sig Dispense Refill   Semaglutide,0.25 or 0.5MG /DOS, (OZEMPIC, 0.25 OR 0.5 MG/DOSE,) 2 MG/3ML SOPN Inject 0.5 mg into the skin once a week. 3 mL 1   Accu-Chek Softclix Lancets lancets Use as directed 100 each 0   albuterol (PROVENTIL) (2.5 MG/3ML) 0.083% nebulizer solution Take 3 mLs (2.5 mg total) by nebulization every 4 (four) hours as needed for wheezing or shortness of breath. (Patient not taking: Reported on 09/18/2022) 30 vial 0   albuterol (VENTOLIN HFA) 108 (90 Base) MCG/ACT inhaler Inhale 2 puffs into the lungs every 4 (four) hours as needed for shortness of breath. 18 g 1   albuterol (VENTOLIN HFA) 108 (90 Base) MCG/ACT inhaler Inhale 2 puffs into the lungs every 4 (four) hours as needed for wheezing or shortness of breath. 6.7 g 0   amiodarone (PACERONE) 200 MG tablet Take 1 tablet (200 mg total) by mouth 2 (two) times daily. 180 tablet 0   ammonium lactate (AMLACTIN) 12 % lotion Apply 1 Application topically as needed for dry skin. (Patient not taking: Reported on 09/18/2022) 400 g 0   apixaban (ELIQUIS) 5 MG TABS tablet TAKE ONE TABLET BY MOUTH TWICE DAILY (AM+PM) 180 tablet 0   atorvastatin (LIPITOR) 20 MG tablet TAKE ONE TABLET BY MOUTH ONCE DAILY (EVENING) 90 tablet 0   benzonatate (TESSALON) 100 MG capsule Take 1 capsule (100 mg total) by mouth every 8 (eight) hours. (Patient not taking: Reported on 09/18/2022) 21 capsule 0   Blood Glucose Monitoring Suppl (BLOOD GLUCOSE MONITOR SYSTEM) w/Device KIT USE AS DIRECTED 1 kit 0   Blood Pressure Monitor DEVI Please provide patient with insurance approved blood pressure monitor ICD 10  I10 1  each 0   ciclopirox (PENLAC) 8 % solution Apply topically at bedtime. Apply over nail and surrounding skin. Apply daily over previous coat. After seven (7) days, may remove with alcohol and continue cycle. 6.6 mL 2   FARXIGA 10 MG TABS tablet TAKE ONE TABLET BY MOUTH EVERY MORNING BEFORE BREAKFAST (AM) 30 tablet 0   loperamide (IMODIUM) 2 MG capsule Take 1 capsule (2 mg total) by mouth 4 (four) times daily as needed for diarrhea or loose stools. (Patient not taking: Reported on 09/18/2022) 12 capsule 0   metFORMIN (GLUCOPHAGE) 500 MG tablet Take 1 tablet (500 mg total) by mouth 2 (two) times daily with a meal. 180 tablet 3   metoprolol succinate (TOPROL-XL) 25 MG 24 hr tablet Take 25 mg by mouth daily.     mometasone-formoterol (DULERA) 100-5 MCG/ACT AERO Inhale 1 puff into the lungs daily. (Patient not taking: Reported on 09/18/2022)     spironolactone (ALDACTONE) 25 MG tablet TAKE ONE TABLET BY MOUTH ONCE DAILY (AM) 90 tablet 0   tadalafil (CIALIS) 20 MG tablet Take 0.5-1 tablets (10-20 mg total) by mouth every other day as needed for erectile dysfunction. 10 tablet 11   traMADol (ULTRAM) 50 MG tablet  Take 1 tablet (50 mg total) by mouth every 8 (eight) hours as needed. 30 tablet 0   traZODone (DESYREL) 100 MG tablet Take 0.5-1 tablets (50-100 mg total) by mouth at bedtime. 90 tablet 1   digoxin (LANOXIN) 0.125 MG tablet Take 1 tablet (0.125 mg total) by mouth daily. NEEDS FOLLOW UP APPOINTMENT FOR MORE REFILLS 15 tablet 0   sacubitril-valsartan (ENTRESTO) 49-51 MG TAKE 1 TABLET BY MOUTH 2 TIMES DAILY 60 tablet 0   torsemide (DEMADEX) 20 MG tablet Take 1 tablet (20 mg total) by mouth daily. NEEDS FOLLOW UP APPOINTMENT FOR MORE REFILLS 60 tablet 0   No facility-administered medications prior to visit.    Allergies  Allergen Reactions   Lisinopril Cough       Objective:    BP 126/89 (BP Location: Left Arm, Patient Position: Sitting, Cuff Size: Normal)   Pulse 78   Ht 6\' 1"  (1.854 m)   Wt  (!) 301 lb 9.6 oz (136.8 kg)   SpO2 100%   BMI 39.79 kg/m  Wt Readings from Last 3 Encounters:  10/15/22 (!) 301 lb 9.6 oz (136.8 kg)  09/18/22 291 lb (132 kg)  07/16/22 295 lb (133.8 kg)    Physical Exam Vitals and nursing note reviewed.  Constitutional:      Appearance: He is well-developed.  HENT:     Head: Normocephalic and atraumatic.  Cardiovascular:     Rate and Rhythm: Normal rate and regular rhythm.     Heart sounds: Normal heart sounds. No murmur heard.    No friction rub. No gallop.  Pulmonary:     Effort: Pulmonary effort is normal. No tachypnea or respiratory distress.     Breath sounds: Normal breath sounds. No decreased breath sounds, wheezing, rhonchi or rales.  Chest:     Chest wall: No tenderness.  Abdominal:     General: Bowel sounds are normal.     Palpations: Abdomen is soft.  Musculoskeletal:        General: Normal range of motion.     Cervical back: Normal range of motion.  Skin:    General: Skin is warm and dry.  Neurological:     Mental Status: He is alert and oriented to person, place, and time.     Coordination: Coordination normal.  Psychiatric:        Behavior: Behavior normal. Behavior is cooperative.        Thought Content: Thought content normal.        Judgment: Judgment normal.          Patient has been counseled extensively about nutrition and exercise as well as the importance of adherence with medications and regular follow-up. The patient was given clear instructions to go to ER or return to medical center if symptoms don't improve, worsen or new problems develop. The patient verbalized understanding.   Follow-up: No follow-ups on file.   Claiborne Rigg, FNP-BC [facility] [place], [place] [phone]   10/22/2022, 11:16 AM

## 2022-10-29 ENCOUNTER — Ambulatory Visit: Payer: Medicaid Other

## 2022-10-29 VITALS — Ht 74.0 in | Wt 298.0 lb

## 2022-10-29 DIAGNOSIS — Z1211 Encounter for screening for malignant neoplasm of colon: Secondary | ICD-10-CM

## 2022-10-29 NOTE — Progress Notes (Signed)
No egg or soy allergy known to patient  No issues known to pt with past sedation with any surgeries or procedures Patient denies ever being told they had issues or difficulty with intubation  No FH of Malignant Hyperthermia Pt is not on diet pills Pt is not on  home 02  Pt is not on blood thinners  Pt denies issues with constipation  No A fib or A flutter Have any cardiac testing pending--no Pt can ambulate with cane Pt denies use of chewing tobacco Discussed diabetic I weight loss medication holds Discussed NSAID holds Checked BMI Pt instructed to use [employer] or GoodRx for a price reduction on prep  Patient's chart reviewed by Cathlyn Parsons CNRA prior to previsit and patient appropriate for the [facility].  Pre visit completed and red dot placed by patient's name on their procedure day (on provider's schedule).

## 2022-10-30 ENCOUNTER — Other Ambulatory Visit: Payer: Self-pay | Admitting: Nurse Practitioner

## 2022-10-30 DIAGNOSIS — Z1212 Encounter for screening for malignant neoplasm of rectum: Secondary | ICD-10-CM

## 2022-10-30 DIAGNOSIS — Z1211 Encounter for screening for malignant neoplasm of colon: Secondary | ICD-10-CM

## 2022-11-11 ENCOUNTER — Telehealth: Payer: Self-pay | Admitting: Gastroenterology

## 2022-11-11 NOTE — Telephone Encounter (Signed)
Inbound call from patient stating that he is scheduled to have a colonoscopy tomorrow 10/17 at 2:30 with Dr. Myrtie Neither and is needing guidance going over his prep instructions. Patient stated he had a stroke a few years ago and has a hard time understanding when he reads. Please advise.

## 2022-11-11 NOTE — Telephone Encounter (Signed)
Returned the patient's phone call and reviewed the clear liquid diet and the mixture of the Suprep and water as well as the times he should take the prep and NPO status prior to the procedure. He verbalized understanding.

## 2022-11-12 ENCOUNTER — Encounter: Payer: Self-pay | Admitting: Gastroenterology

## 2022-11-12 ENCOUNTER — Ambulatory Visit: Payer: Medicaid Other | Admitting: Gastroenterology

## 2022-11-12 VITALS — BP 116/84 | HR 84 | Temp 98.0°F | Resp 18 | Ht 74.0 in | Wt 298.0 lb

## 2022-11-12 DIAGNOSIS — D122 Benign neoplasm of ascending colon: Secondary | ICD-10-CM | POA: Diagnosis not present

## 2022-11-12 DIAGNOSIS — D120 Benign neoplasm of cecum: Secondary | ICD-10-CM | POA: Diagnosis not present

## 2022-11-12 DIAGNOSIS — Z1211 Encounter for screening for malignant neoplasm of colon: Secondary | ICD-10-CM | POA: Diagnosis not present

## 2022-11-12 MED ORDER — SODIUM CHLORIDE 0.9 % IV SOLN: 500.0000 mL | Freq: Once | INTRAVENOUS | Status: DC

## 2022-11-12 NOTE — Patient Instructions (Addendum)
Resume Eliquis as normal tomorrow 11/13/22. Do not take your blood thinner today.   YOU HAD AN ENDOSCOPIC PROCEDURE TODAY AT THE [facility]:   Refer to the procedure report that was given to you for any specific questions about what was found during the examination.  If the procedure report does not answer your questions, please call your gastroenterologist to clarify.  If you requested that your care partner not be given the details of your procedure findings, then the procedure report has been included in a sealed envelope for you to review at your convenience later.  YOU SHOULD EXPECT: Some feelings of bloating in the abdomen. Passage of more gas than usual.  Walking can help get rid of the air that was put into your GI tract during the procedure and reduce the bloating. If you had a lower endoscopy (such as a colonoscopy or flexible sigmoidoscopy) you may notice spotting of blood in your stool or on the toilet paper. If you underwent a bowel prep for your procedure, you may not have a normal bowel movement for a few days.  Please Note:  You might notice some irritation and congestion in your nose or some drainage.  This is from the oxygen used during your procedure.  There is no need for concern and it should clear up in a day or so.  SYMPTOMS TO REPORT IMMEDIATELY:  Following lower endoscopy (colonoscopy or flexible sigmoidoscopy):  Excessive amounts of blood in the stool  Significant tenderness or worsening of abdominal pains  Swelling of the abdomen that is new, acute  Fever of 100F or higher   For urgent or emergent issues, a gastroenterologist can be reached at any hour by calling [phone]. Do not use MyChart messaging for urgent concerns.    DIET:  We do recommend a small meal at first, but then you may proceed to your regular diet.  Drink plenty of fluids but you should avoid alcoholic beverages for 24 hours.  ACTIVITY:  You should plan to take it easy for the  rest of today and you should NOT DRIVE or use heavy machinery until tomorrow (because of the sedation medicines used during the test).    FOLLOW UP: Our staff will call the number listed on your records the next business day following your procedure.  We will call around 7:15- 8:00 am to check on you and address any questions or concerns that you may have regarding the information given to you following your procedure. If we do not reach you, we will leave a message.     If any biopsies were taken you will be contacted by phone or by letter within the next 1-3 weeks.  Please call us at [phone] if you have not heard about the biopsies in 3 weeks.    SIGNATURES/CONFIDENTIALITY: You and/or your care partner have signed paperwork which will be entered into your electronic medical record.  These signatures attest to the fact that that the information above on your After Visit Summary has been reviewed and is understood.  Full responsibility of the confidentiality of this discharge information lies with you and/or your care-partner.

## 2022-11-12 NOTE — Progress Notes (Signed)
Pt's states no medical or surgical changes since previsit or office visit. 

## 2022-11-12 NOTE — Op Note (Signed)
[facility] Patient Name: Donald [name] Procedure Date: 11/12/2022 9:47 AM MRN: [id number] Endoscopist: Sherilyn Cooter L. Myrtie Neither , MD, [id number] Age: 62 Referring MD:  Date of Birth: [birth year] Gender: Male Account #: [id number] Procedure:                Colonoscopy Indications:              Screening for colorectal malignant neoplasm, This                            is the patient's first colonoscopy Medicines:                Monitored Anesthesia Care Procedure:                Pre-Anesthesia Assessment:                           - Prior to the procedure, a History and Physical                            was performed, and patient medications and                            allergies were reviewed. The patient's tolerance of                            previous anesthesia was also reviewed. The risks                            and benefits of the procedure and the sedation                            options and risks were discussed with the patient.                            All questions were answered, and informed consent                            was obtained. Prior Anticoagulants: The patient has                            taken Eliquis (apixaban), last dose was 2 days                            prior to procedure. ASA Grade Assessment: III - A                            patient with severe systemic disease. After                            reviewing the risks and benefits, the patient was                            deemed in satisfactory condition to undergo the  procedure.                           After obtaining informed consent, the colonoscope                            was passed under direct vision. Throughout the                            procedure, the patient's blood pressure, pulse, and                            oxygen saturations were monitored continuously. The                            Olympus Scope SN [id number] was introduced through the                             anus and advanced to the the cecum, identified by                            appendiceal orifice and ileocecal valve. The                            colonoscopy was performed without difficulty. The                            patient tolerated the procedure well. The quality                            of the bowel preparation was excellent. The                            ileocecal valve, appendiceal orifice, and rectum                            were photographed. Scope In: 10:09:12 AM Scope Out: 10:25:27 AM Scope Withdrawal Time: 0 hours 14 minutes 8 seconds  Total Procedure Duration: 0 hours 16 minutes 15 seconds  Findings:                 The perianal and digital rectal examinations were                            normal.                           Repeat examination of right colon under NBI                            performed.                           Two semi-sessile polyps were found in the proximal  ascending colon and cecum. The polyps were 2 to 6                            mm in size. These polyps were removed with a cold                            snare. Resection and retrieval were complete.                           Multiple diverticula were found from transverse                            colon to sigmoid colon.                           The exam was otherwise without abnormality on                            direct and retroflexion views. Complications:            No immediate complications. Estimated Blood Loss:     Estimated blood loss was minimal. Impression:               - Two 2 to 6 mm polyps in the proximal ascending                            colon and in the cecum, removed with a cold snare.                            Resected and retrieved.                           - Diverticulosis from transverse colon to sigmoid                            colon.                           - The examination was otherwise  normal on direct                            and retroflexion views. Recommendation:           - Patient has a contact number available for                            emergencies. The signs and symptoms of potential                            delayed complications were discussed with the                            patient. Return to normal activities tomorrow.                            Written  discharge instructions were provided to the                            patient.                           - Resume previous diet.                           - Resume Eliquis (apixaban) at prior dose tomorrow.                           - Await pathology results.                           - Repeat colonoscopy is recommended for                            surveillance. The colonoscopy date will be                            determined after pathology results from today's                            exam become available for review. Yitzchak Kothari L. Myrtie Neither, MD 11/12/2022 10:30:03 AM This report has been signed electronically.

## 2022-11-12 NOTE — Progress Notes (Signed)
Called to room to assist during endoscopic procedure.  Patient ID and intended procedure confirmed with present staff. Received instructions for my participation in the procedure from the performing physician.  

## 2022-11-12 NOTE — Progress Notes (Signed)
Uneventful anesthetic. Report to pacu rn. Vss. Care resumed by rn.

## 2022-11-12 NOTE — Progress Notes (Signed)
History and Physical:  This patient presents for endoscopic testing for: Encounter Diagnosis  Name Primary?   Special screening for malignant neoplasms, colon Yes    62 yo man here for screening colonoscopy.  Cardiac and other medical issues outlined in the office consult note of 07/16/22 wit hno significant clinical changes since then based on today's chart review. He has also been off OAC for the last 2 days.  Patient is otherwise without complaints or active issues today.   Past Medical History: Past Medical History:  Diagnosis Date   Arrhythmia    A fib    Atrial fibrillation (HCC)    CHF (congestive heart failure) (HCC)    COPD (chronic obstructive pulmonary disease) (HCC)    Diabetes mellitus without complication (HCC)    Hypertension    Sleep apnea    Stroke [facility])      Past Surgical History: Past Surgical History:  Procedure Laterality Date   ABLATION     CARDIOVERSION N/A 03/12/2020   Procedure: CARDIOVERSION;  Surgeon: Chilton Si, MD;  Location: [facility] ENDOSCOPY;  Service: Cardiovascular;  Laterality: N/A;   CARDIOVERSION N/A 08/13/2020   Procedure: CARDIOVERSION;  Surgeon: Dolores Patty, MD;  Location: [facility] ENDOSCOPY;  Service: Cardiovascular;  Laterality: N/A;   CARDIOVERSION N/A 09/02/2020   Procedure: CARDIOVERSION;  Surgeon: Parke Poisson, MD;  Location: [facility] ENDOSCOPY;  Service: Cardiovascular;  Laterality: N/A;   INCISION AND DRAINAGE PERIRECTAL ABSCESS N/A 10/20/2012   Procedure: IRRIGATION AND DEBRIDEMENT PERIRECTAL ABSCESS;  Surgeon: Wilmon Arms. Corliss Skains, MD;  Location: [facility] OR;  Service: General;  Laterality: N/A;  Lithotomy   RIGHT/LEFT HEART CATH AND CORONARY ANGIOGRAPHY N/A 08/12/2020   Procedure: RIGHT/LEFT HEART CATH AND CORONARY ANGIOGRAPHY;  Surgeon: Dolores Patty, MD;  Location: [facility] INVASIVE CV LAB;  Service: Cardiovascular;  Laterality: N/A;   TEE WITHOUT CARDIOVERSION N/A 03/12/2020   Procedure: TRANSESOPHAGEAL ECHOCARDIOGRAM (TEE);  Surgeon:  Chilton Si, MD;  Location: [facility] ENDOSCOPY;  Service: Cardiovascular;  Laterality: N/A;   TEE WITHOUT CARDIOVERSION N/A 08/13/2020   Procedure: TRANSESOPHAGEAL ECHOCARDIOGRAM (TEE);  Surgeon: Dolores Patty, MD;  Location: [facility] ENDOSCOPY;  Service: Cardiovascular;  Laterality: N/A;    Allergies: Allergies  Allergen Reactions   Lisinopril Cough    Outpatient Meds: Current Outpatient Medications  Medication Sig Dispense Refill   Accu-Chek Softclix Lancets lancets Use as directed 100 each 0   albuterol (PROVENTIL) (2.5 MG/3ML) 0.083% nebulizer solution Take 3 mLs (2.5 mg total) by nebulization every 4 (four) hours as needed for wheezing or shortness of breath. 30 vial 0   albuterol (VENTOLIN HFA) 108 (90 Base) MCG/ACT inhaler Inhale 2 puffs into the lungs every 4 (four) hours as needed for shortness of breath. 18 g 1   albuterol (VENTOLIN HFA) 108 (90 Base) MCG/ACT inhaler Inhale 2 puffs into the lungs every 4 (four) hours as needed for wheezing or shortness of breath. 6.7 g 0   amiodarone (PACERONE) 200 MG tablet Take 1 tablet (200 mg total) by mouth 2 (two) times daily. 180 tablet 0   ammonium lactate (AMLACTIN) 12 % lotion Apply 1 Application topically as needed for dry skin. 400 g 0   atorvastatin (LIPITOR) 20 MG tablet TAKE ONE TABLET BY MOUTH ONCE DAILY (EVENING) 90 tablet 0   ciclopirox (PENLAC) 8 % solution Apply topically at bedtime. Apply over nail and surrounding skin. Apply daily over previous coat. After seven (7) days, may remove with alcohol and continue cycle. 6.6 mL 2   digoxin (LANOXIN) 0.125  MG tablet TAKE 1/2 TABLET (0.125 MG TOTAL) BY MOUTH DAILY (AM) 15 tablet 0   FARXIGA 10 MG TABS tablet TAKE ONE TABLET BY MOUTH EVERY MORNING BEFORE BREAKFAST (AM) 30 tablet 0   metFORMIN (GLUCOPHAGE) 500 MG tablet Take 1 tablet (500 mg total) by mouth 2 (two) times daily with a meal. 180 tablet 3   metoprolol succinate (TOPROL-XL) 25 MG 24 hr tablet Take 25 mg by mouth daily.      mometasone-formoterol (DULERA) 100-5 MCG/ACT AERO Inhale 1 puff into the lungs daily.     sacubitril-valsartan (ENTRESTO) 49-51 MG TAKE ONE TABLET BY MOUTH TWICE DAILY (AM+PM) 60 tablet 3   spironolactone (ALDACTONE) 25 MG tablet TAKE ONE TABLET BY MOUTH ONCE DAILY (AM) 90 tablet 0   torsemide (DEMADEX) 20 MG tablet TAKE 1 TABLET (20 MG TOTAL) BY MOUTH DAILY 30 tablet 0   traMADol (ULTRAM) 50 MG tablet Take 1 tablet (50 mg total) by mouth every 8 (eight) hours as needed. 30 tablet 0   traZODone (DESYREL) 100 MG tablet Take 0.5-1 tablets (50-100 mg total) by mouth at bedtime. 90 tablet 1   apixaban (ELIQUIS) 5 MG TABS tablet TAKE ONE TABLET BY MOUTH TWICE DAILY (AM+PM) 180 tablet 0   benzonatate (TESSALON) 100 MG capsule Take 1 capsule (100 mg total) by mouth every 8 (eight) hours. (Patient not taking: Reported on 11/12/2022) 21 capsule 0   Blood Glucose Monitoring Suppl (BLOOD GLUCOSE MONITOR SYSTEM) w/Device KIT USE AS DIRECTED (Patient not taking: Reported on 11/12/2022) 1 kit 0   Blood Pressure Monitor DEVI Please provide patient with insurance approved blood pressure monitor ICD 10  I10 (Patient not taking: Reported on 11/12/2022) 1 each 0   loperamide (IMODIUM) 2 MG capsule Take 1 capsule (2 mg total) by mouth 4 (four) times daily as needed for diarrhea or loose stools. (Patient not taking: Reported on 11/12/2022) 12 capsule 0   Semaglutide,0.25 or 0.5MG /DOS, (OZEMPIC, 0.25 OR 0.5 MG/DOSE,) 2 MG/3ML SOPN Inject 0.5 mg into the skin once a week. 3 mL 1   tadalafil (CIALIS) 20 MG tablet Take 0.5-1 tablets (10-20 mg total) by mouth every other day as needed for erectile dysfunction. 10 tablet 11   Current Facility-Administered Medications  Medication Dose Route Frequency Provider Last Rate Last Admin   0.9 %  sodium chloride infusion  500 mL Intravenous Once Danis, Starr Lake III, MD          ___________________________________________________________________ Objective   Exam:  BP 119/86    Pulse 86   Temp 98 F (36.7 C) (Skin)   Ht 6\' 2"  (1.88 m)   Wt 298 lb (135.2 kg)   SpO2 95%   BMI 38.26 kg/m   CV: regular , S1/S2 Resp: clear to auscultation bilaterally, normal RR and effort noted GI: soft, no tenderness, with active bowel sounds.   Assessment: Encounter Diagnosis  Name Primary?   Special screening for malignant neoplasms, colon Yes     Plan: Colonoscopy   The benefits and risks of the planned procedure were described in detail with the patient or (when appropriate) their health care proxy.  Risks were outlined as including, but not limited to, bleeding, infection, perforation, adverse medication reaction leading to cardiac or pulmonary decompensation, pancreatitis (if ERCP).  The limitation of incomplete mucosal visualization was also discussed.  No guarantees or warranties were given.  The patient is appropriate for an endoscopic procedure in the ambulatory setting.   - Amada Jupiter, MD

## 2022-11-13 ENCOUNTER — Other Ambulatory Visit (HOSPITAL_COMMUNITY): Payer: Self-pay | Admitting: Family Medicine

## 2022-11-13 ENCOUNTER — Other Ambulatory Visit: Payer: Self-pay | Admitting: Nurse Practitioner

## 2022-11-13 ENCOUNTER — Telehealth: Payer: Self-pay | Admitting: *Deleted

## 2022-11-13 ENCOUNTER — Other Ambulatory Visit (HOSPITAL_COMMUNITY): Payer: Self-pay | Admitting: Internal Medicine

## 2022-11-13 ENCOUNTER — Other Ambulatory Visit: Payer: Self-pay | Admitting: Family Medicine

## 2022-11-13 DIAGNOSIS — E1165 Type 2 diabetes mellitus with hyperglycemia: Secondary | ICD-10-CM

## 2022-11-13 DIAGNOSIS — I5043 Acute on chronic combined systolic (congestive) and diastolic (congestive) heart failure: Secondary | ICD-10-CM

## 2022-11-13 NOTE — Telephone Encounter (Signed)
  Follow up Call-     11/12/2022    9:43 AM  Call back number  Post procedure Call Back phone  # [phone]  Permission to leave phone message Yes     Patient questions:  Do you have a fever, pain , or abdominal swelling? No. Pain Score  0 *  Have you tolerated food without any problems? Yes.    Have you been able to return to your normal activities? Yes.    Do you have any questions about your discharge instructions: Diet   No. Medications  No. Follow up visit  No.  Do you have questions or concerns about your Care? No.  Actions: * If pain score is 4 or above: No action needed, pain <4.

## 2022-11-17 LAB — SURGICAL PATHOLOGY

## 2022-11-18 ENCOUNTER — Encounter: Payer: Self-pay | Admitting: Gastroenterology

## 2022-12-10 ENCOUNTER — Other Ambulatory Visit: Payer: Self-pay | Admitting: Physician Assistant

## 2022-12-10 ENCOUNTER — Other Ambulatory Visit (HOSPITAL_COMMUNITY): Payer: Self-pay | Admitting: Internal Medicine

## 2022-12-10 ENCOUNTER — Other Ambulatory Visit (HOSPITAL_COMMUNITY): Payer: Self-pay | Admitting: Family Medicine

## 2022-12-10 DIAGNOSIS — G4700 Insomnia, unspecified: Secondary | ICD-10-CM

## 2023-02-01 ENCOUNTER — Other Ambulatory Visit (HOSPITAL_COMMUNITY): Payer: Self-pay | Admitting: Family Medicine

## 2023-02-01 ENCOUNTER — Other Ambulatory Visit (HOSPITAL_COMMUNITY): Payer: Self-pay | Admitting: Internal Medicine

## 2023-02-01 ENCOUNTER — Other Ambulatory Visit: Payer: Self-pay | Admitting: Internal Medicine

## 2023-02-01 DIAGNOSIS — M170 Bilateral primary osteoarthritis of knee: Secondary | ICD-10-CM

## 2023-02-02 ENCOUNTER — Telehealth: Payer: Self-pay | Admitting: Nurse Practitioner

## 2023-02-02 NOTE — Telephone Encounter (Signed)
 Pt came in this afternoon drop off form for parking placard

## 2023-02-03 NOTE — Telephone Encounter (Signed)
 Forms have been received.  Will contact patient when it has been filled out.

## 2023-02-18 ENCOUNTER — Telehealth: Payer: Self-pay

## 2023-02-18 NOTE — Telephone Encounter (Signed)
Forms have not been completed. Voicemail left with information on when patient can pick up completed forms.

## 2023-02-18 NOTE — Telephone Encounter (Signed)
Patient came in asking for the status of his disability placards.

## 2023-03-03 ENCOUNTER — Other Ambulatory Visit (HOSPITAL_COMMUNITY): Payer: Self-pay | Admitting: Internal Medicine

## 2023-03-03 ENCOUNTER — Other Ambulatory Visit: Payer: Self-pay | Admitting: Family Medicine

## 2023-03-03 ENCOUNTER — Other Ambulatory Visit (HOSPITAL_COMMUNITY): Payer: Self-pay | Admitting: Family Medicine

## 2023-03-03 DIAGNOSIS — I5043 Acute on chronic combined systolic (congestive) and diastolic (congestive) heart failure: Secondary | ICD-10-CM

## 2023-03-03 DIAGNOSIS — E1165 Type 2 diabetes mellitus with hyperglycemia: Secondary | ICD-10-CM

## 2023-03-03 DIAGNOSIS — I10 Essential (primary) hypertension: Secondary | ICD-10-CM

## 2023-03-03 NOTE — Telephone Encounter (Signed)
 Requested medication (s) are due for refill today: yes   Requested medication (s) are on the active medication list: yes   Last refill:  lipitor- 10.18/24 #90 0 refills, aladactone- 11/13/22 #90 0 refills , ozempic - 11/13/22 #9 ml 0 refills , farxiga - 11/13/22 #30 3 refills, entresto -10/15/22 #60 3 refills   Future visit scheduled: no   Notes to clinic:  no refills remain.protocol failed last labs 07/17/22. Entresto - medication not assigned to a protocol. Do you want to refill Rxs?     Requested Prescriptions  Pending Prescriptions Disp Refills   atorvastatin  (LIPITOR) 20 MG tablet [Pharmacy Med Name: ATORVASTATIN  CALCIUM  20 MG ORAL TABLET] 90 tablet 0    Sig: TAKE ONE TABLET BY MOUTH ONCE DAILY (EVENING)     Cardiovascular:  Antilipid - Statins Failed - 03/03/2023  3:33 PM      Failed - Lipid Panel in normal range within the last 12 months    Cholesterol, Total  Date Value Ref Range Status  07/17/2022 142 100 - 199 mg/dL Final   LDL Chol Calc (NIH)  Date Value Ref Range Status  07/17/2022 74 0 - 99 mg/dL Final   HDL  Date Value Ref Range Status  07/17/2022 45 >39 mg/dL Final   Triglycerides  Date Value Ref Range Status  07/17/2022 130 0 - 149 mg/dL Final         Passed - Patient is not pregnant      Passed - Valid encounter within last 12 months    Recent Outpatient Visits           4 months ago Aphasia as late effect of cerebrovascular accident (CVA)   [place] [facility] Dept Of [place]. [facility] Theotis Haze ORN, NP   5 months ago Erectile dysfunction, unspecified erectile dysfunction type   [place] [facility] A Dept Of [place]. [facility] [place], [place] W, NP   7 months ago Type 2 diabetes mellitus with hyperglycemia, without long-term current use of insulin  [facility])   [place] [facility] - A Dept Of [place]. [facility] Theotis Haze ORN, NP   11 months ago Hospital discharge follow-up    [facility] - A Dept Of [place]. [facility] [place], [place] W, NP   11 months ago Type 2 diabetes mellitus with hyperglycemia, without long-term current use of insulin  [facility])   [place] [facility] - A Dept Of [place]. [facility], Jon M, PA-C               spironolactone  (ALDACTONE ) 25 MG tablet [Pharmacy Med Name: SPIRONOLACTONE  25 MG ORAL TABLET] 90 tablet 0    Sig: TAKE ONE TABLET BY MOUTH ONCE DAILY (AM)     Cardiovascular: Diuretics - Aldosterone Antagonist Failed - 03/03/2023  3:33 PM      Failed - Cr in normal range and within 180 days    Creatinine, Ser  Date Value Ref Range Status  07/17/2022 1.15 0.76 - 1.27 mg/dL Final         Failed - K in normal range and within 180 days    Potassium  Date Value Ref Range Status  07/17/2022 4.1 3.5 - 5.2 mmol/L Final         Failed - Na in normal range and within 180 days    Sodium  Date Value Ref Range Status  07/17/2022 142 134 -  144 mmol/L Final         Failed - eGFR is 30 or above and within 180 days    GFR calc Af Amer  Date Value Ref Range Status  05/24/2019 >60 >60 mL/min Final   GFR, Estimated  Date Value Ref Range Status  03/07/2022 >60 >60 mL/min Final    Comment:    (NOTE) Calculated using the CKD-EPI Creatinine Equation (2021)    eGFR  Date Value Ref Range Status  07/17/2022 73 >59 mL/min/1.73 Final         Passed - Last BP in normal range    BP Readings from Last 1 Encounters:  11/12/22 116/84         Passed - Valid encounter within last 6 months    Recent Outpatient Visits           4 months ago Aphasia as late effect of cerebrovascular accident (CVA)   [place] [facility] A Dept Of [place]. [facility] Theotis Haze ORN, NP   5 months ago Erectile dysfunction, unspecified erectile dysfunction type   [place] [facility] A Dept Of [place]. [facility] [place], [place] W, NP   7 months  ago Type 2 diabetes mellitus with hyperglycemia, without long-term current use of insulin  [facility])   [place] [facility] - A Dept Of [place]. [facility] Theotis Haze ORN, NP   11 months ago Hospital discharge follow-up   [facility] - A Dept Of [place]. [facility] [place], [place] W, NP   11 months ago Type 2 diabetes mellitus with hyperglycemia, without long-term current use of insulin  [facility])   [place] [facility] - A Dept Of [place]. [facility], Jon M, PA-C               OZEMPIC , 0.25 OR 0.5 MG/DOSE, 2 MG/3ML SOPN [Pharmacy Med Name: OZEMPIC  (0.25 OR 0.5 MG/DOSE) 2 MG/3ML SUBCUTANEOUS SOLUTION PEN-INJECTOR] 9 mL 0    Sig: INJECT 0.5 MG INTO THE SKIN ONCE A WEEK.     Endocrinology:  Diabetes - GLP-1 Receptor Agonists - semaglutide  Failed - 03/03/2023  3:33 PM      Failed - HBA1C in normal range and within 180 days    HbA1c, POC (controlled diabetic range)  Date Value Ref Range Status  03/26/2022 7.5 (A) 0.0 - 7.0 % Final   Hgb A1c MFr Bld  Date Value Ref Range Status  09/18/2022 7.7 (H) 4.8 - 5.6 % Final    Comment:             Prediabetes: 5.7 - 6.4          Diabetes: >6.4          Glycemic control for adults with diabetes: <7.0          Passed - Cr in normal range and within 360 days    Creatinine, Ser  Date Value Ref Range Status  07/17/2022 1.15 0.76 - 1.27 mg/dL Final         Passed - Valid encounter within last 6 months    Recent Outpatient Visits           4 months ago Aphasia as late effect of cerebrovascular accident (CVA)   [place] [facility] - A Dept Of [place]. [facility] [place], [place] W, NP   5 months ago Erectile dysfunction, unspecified erectile  dysfunction type   [place] [facility] - A Dept Of [place]. [facility] [place], [place] W, NP   7 months ago Type 2 diabetes mellitus with hyperglycemia, without long-term current use  of insulin  [facility])   [place] [facility] - A Dept Of [place]. [facility] Theotis Haze ORN, NP   11 months ago Hospital discharge follow-up   [facility] - A Dept Of [place]. [facility] [place], [place] W, NP   11 months ago Type 2 diabetes mellitus with hyperglycemia, without long-term current use of insulin  [facility])   [place] [facility] - A Dept Of [place]. [facility], Jon M, PA-C               FARXIGA  10 MG TABS tablet [Pharmacy Med Name: FARXIGA  10 MG ORAL TABLET] 30 tablet 3    Sig: TAKE ONE TABLET BY MOUTH EVERY MORNING BEFORE BREAKFAST (AM)     Endocrinology:  Diabetes - SGLT2 Inhibitors Passed - 03/03/2023  3:33 PM      Passed - Cr in normal range and within 360 days    Creatinine, Ser  Date Value Ref Range Status  07/17/2022 1.15 0.76 - 1.27 mg/dL Final         Passed - HBA1C is between 0 and 7.9 and within 180 days    HbA1c, POC (controlled diabetic range)  Date Value Ref Range Status  03/26/2022 7.5 (A) 0.0 - 7.0 % Final   Hgb A1c MFr Bld  Date Value Ref Range Status  09/18/2022 7.7 (H) 4.8 - 5.6 % Final    Comment:             Prediabetes: 5.7 - 6.4          Diabetes: >6.4          Glycemic control for adults with diabetes: <7.0          Passed - eGFR in normal range and within 360 days    GFR calc Af Amer  Date Value Ref Range Status  05/24/2019 >60 >60 mL/min Final   GFR, Estimated  Date Value Ref Range Status  03/07/2022 >60 >60 mL/min Final    Comment:    (NOTE) Calculated using the CKD-EPI Creatinine Equation (2021)    eGFR  Date Value Ref Range Status  07/17/2022 73 >59 mL/min/1.73 Final         Passed - Valid encounter within last 6 months    Recent Outpatient Visits           4 months ago Aphasia as late effect of cerebrovascular accident (CVA)   [place] [facility] - A Dept Of [place]. [facility] Theotis Haze ORN, NP   5 months  ago Erectile dysfunction, unspecified erectile dysfunction type   [place] [facility] A Dept Of [place]. [facility] [place], [place] W, NP   7 months ago Type 2 diabetes mellitus with hyperglycemia, without long-term current use of insulin  [facility])   [place] [facility] - A Dept Of [place]. [facility] Theotis Haze ORN, NP   11 months ago Hospital discharge follow-up   [facility] - A Dept Of [place]. [facility] Theotis Haze ORN, NP   11 months ago Type 2 diabetes mellitus with hyperglycemia, without long-term current use of insulin  [facility])     [facility] - A Dept Of [place]. [facility], Jon M, PA-C               ENTRESTO  49-51 MG [Pharmacy Med Name: ENTRESTO  49-51 MG ORAL TABLET] 60 tablet 3    Sig: TAKE ONE TABLET BY MOUTH TWICE DAILY (AM+PM)     Off-Protocol Failed - 03/03/2023  3:33 PM      Failed - Medication not assigned to a protocol, review manually.      Passed - Valid encounter within last 12 months    Recent Outpatient Visits           4 months ago Aphasia as late effect of cerebrovascular accident (CVA)   [place] [facility] A Dept Of [place]. [facility] Theotis Haze ORN, NP   5 months ago Erectile dysfunction, unspecified erectile dysfunction type   [place] [facility] - A Dept Of [place]. [facility] [place], [place] W, NP   7 months ago Type 2 diabetes mellitus with hyperglycemia, without long-term current use of insulin  [facility])   [place] [facility] - A Dept Of [place]. [facility] Theotis Haze ORN, NP   11 months ago Hospital discharge follow-up   [facility] - A Dept Of [place]. [facility] [place], [place] W, NP   11 months ago Type 2 diabetes mellitus with hyperglycemia, without long-term current use of insulin  [facility])   [place] [facility] - A  Dept Of [place]. [facility] [place], [place], PA-C

## 2023-03-30 ENCOUNTER — Other Ambulatory Visit: Payer: Self-pay | Admitting: Family Medicine

## 2023-03-30 ENCOUNTER — Other Ambulatory Visit: Payer: Self-pay | Admitting: Nurse Practitioner

## 2023-03-30 ENCOUNTER — Other Ambulatory Visit (HOSPITAL_COMMUNITY): Payer: Self-pay | Admitting: Internal Medicine

## 2023-03-30 ENCOUNTER — Other Ambulatory Visit (HOSPITAL_COMMUNITY): Payer: Self-pay | Admitting: Family Medicine

## 2023-03-30 DIAGNOSIS — G4700 Insomnia, unspecified: Secondary | ICD-10-CM

## 2023-03-30 DIAGNOSIS — M170 Bilateral primary osteoarthritis of knee: Secondary | ICD-10-CM

## 2023-04-07 ENCOUNTER — Telehealth: Payer: Self-pay | Admitting: Nurse Practitioner

## 2023-04-07 NOTE — Telephone Encounter (Signed)
 A document form has been faxed:  Physician's Order , to be filled out by provider. Send document back via Fax within 5-days. Document is located in providers tray at front office.           Fax number:  [phone]

## 2023-04-12 ENCOUNTER — Other Ambulatory Visit (HOSPITAL_COMMUNITY): Payer: Self-pay | Admitting: Nurse Practitioner

## 2023-04-12 ENCOUNTER — Telehealth (HOSPITAL_COMMUNITY): Payer: Self-pay | Admitting: Cardiology

## 2023-04-12 DIAGNOSIS — E1165 Type 2 diabetes mellitus with hyperglycemia: Secondary | ICD-10-CM

## 2023-04-12 NOTE — Telephone Encounter (Signed)
 Pharmacy left VM on triage line requesting refill on zocor  Refill denied Pt last OV 2022 Pt is established with [facility]

## 2023-04-30 ENCOUNTER — Other Ambulatory Visit: Payer: Self-pay | Admitting: Family Medicine

## 2023-04-30 DIAGNOSIS — I5043 Acute on chronic combined systolic (congestive) and diastolic (congestive) heart failure: Secondary | ICD-10-CM

## 2023-04-30 DIAGNOSIS — I10 Essential (primary) hypertension: Secondary | ICD-10-CM

## 2023-04-30 DIAGNOSIS — E1165 Type 2 diabetes mellitus with hyperglycemia: Secondary | ICD-10-CM

## 2023-04-30 NOTE — Telephone Encounter (Signed)
 Copied from CRM [id number]. Topic: Clinical - Medication Refill >> Apr 30, 2023  1:09 PM Everette C wrote: Most Recent Primary Care Visit:  Provider: [name] W  Department[facility]  Visit Type: OFFICE VISIT  Date: 10/15/2022  Medication: traZODone (DESYREL) 100 MG tablet [[id number]]  traMADol (ULTRAM) 50 MG tablet [[id number]]  tadalafil (CIALIS) 20 MG tablet [[id number]]  spironolactone (ALDACTONE) 25 MG tablet [[id number]]  Semaglutide,0.25 or 0.5MG /DOS, (OZEMPIC, 0.25 OR 0.5 MG/DOSE,) 2 MG/3ML SOPN [[id number]]   sacubitril-valsartan (ENTRESTO) 49-51 MG [[id number]]  metFORMIN (GLUCOPHAGE) 500 MG tablet [[id number]]  loperamide (IMODIUM) 2 MG capsule [[id number]]  FARXIGA 10 MG TABS tablet [[id number]]  atorvastatin (LIPITOR) 20 MG tablet [[id number]]  apixaban (ELIQUIS) 5 MG TABS tablet [[id number]]  albuterol (VENTOLIN HFA) 108 (90 Base) MCG/ACT inhaler [[id number]]   Has the patient contacted their pharmacy? Yes (Agent: If no, request that the patient contact the pharmacy for the refill. If patient does not wish to contact the pharmacy document the reason why and proceed with request.) (Agent: If yes, when and what did the pharmacy advise?)  Is this the correct pharmacy for this prescription? Yes If no, delete pharmacy and type the correct one.  This is the patient's preferred pharmacy:  [facility] - [place], [place] - [address] [place] [address] Phone: [phone] Fax: [phone]   Has the prescription been filled recently? Yes  Is the patient out of the medication? Yes  Has the patient been seen for an appointment in the last year OR does the patient have an upcoming appointment? Yes  Can we respond through MyChart? No  Agent: Please be advised that Rx refills may take up to 3 business days. We ask that you follow-up with your pharmacy.

## 2023-05-31 ENCOUNTER — Ambulatory Visit: Attending: Nurse Practitioner | Admitting: Nurse Practitioner

## 2023-07-06 ENCOUNTER — Ambulatory Visit: Attending: Nurse Practitioner | Admitting: Nurse Practitioner

## 2023-07-06 ENCOUNTER — Other Ambulatory Visit: Payer: Self-pay | Admitting: Family Medicine

## 2023-07-06 ENCOUNTER — Encounter: Payer: Self-pay | Admitting: Nurse Practitioner

## 2023-07-06 ENCOUNTER — Other Ambulatory Visit (HOSPITAL_COMMUNITY): Payer: Self-pay | Admitting: Family Medicine

## 2023-07-06 ENCOUNTER — Other Ambulatory Visit (HOSPITAL_COMMUNITY)
Admission: RE | Admit: 2023-07-06 | Discharge: 2023-07-06 | Disposition: A | Discharge status: Home / Self Care | Source: Ambulatory Visit | Attending: Nurse Practitioner | Admitting: Nurse Practitioner

## 2023-07-06 VITALS — BP 128/83 | HR 95 | Resp 18 | Ht 74.0 in | Wt 296.6 lb

## 2023-07-06 DIAGNOSIS — Z0001 Encounter for general adult medical examination with abnormal findings: Secondary | ICD-10-CM | POA: Diagnosis not present

## 2023-07-06 DIAGNOSIS — Z23 Encounter for immunization: Secondary | ICD-10-CM

## 2023-07-06 DIAGNOSIS — E119 Type 2 diabetes mellitus without complications: Secondary | ICD-10-CM | POA: Diagnosis not present

## 2023-07-06 DIAGNOSIS — I10 Essential (primary) hypertension: Secondary | ICD-10-CM

## 2023-07-06 DIAGNOSIS — Z7985 Long-term (current) use of injectable non-insulin antidiabetic drugs: Secondary | ICD-10-CM

## 2023-07-06 DIAGNOSIS — I5043 Acute on chronic combined systolic (congestive) and diastolic (congestive) heart failure: Secondary | ICD-10-CM

## 2023-07-06 DIAGNOSIS — J984 Other disorders of lung: Secondary | ICD-10-CM

## 2023-07-06 DIAGNOSIS — Z7984 Long term (current) use of oral hypoglycemic drugs: Secondary | ICD-10-CM

## 2023-07-06 DIAGNOSIS — B351 Tinea unguium: Secondary | ICD-10-CM

## 2023-07-06 DIAGNOSIS — Z7251 High risk heterosexual behavior: Secondary | ICD-10-CM | POA: Insufficient documentation

## 2023-07-06 DIAGNOSIS — Z0000 Encounter for general adult medical examination without abnormal findings: Secondary | ICD-10-CM

## 2023-07-06 DIAGNOSIS — I4891 Unspecified atrial fibrillation: Secondary | ICD-10-CM

## 2023-07-06 DIAGNOSIS — G4700 Insomnia, unspecified: Secondary | ICD-10-CM

## 2023-07-06 MED ORDER — TRAZODONE HCL 100 MG PO TABS: 50.0000 mg | ORAL_TABLET | Freq: Every day | ORAL | 1 refills | Status: DC

## 2023-07-06 MED ORDER — SPIRONOLACTONE 25 MG PO TABS: 25.0000 mg | ORAL_TABLET | Freq: Every day | ORAL | 1 refills | Status: AC

## 2023-07-06 MED ORDER — OZEMPIC (0.25 OR 0.5 MG/DOSE) 2 MG/3ML ~~LOC~~ SOPN: 0.5000 mg | PEN_INJECTOR | SUBCUTANEOUS | 3 refills | Status: DC

## 2023-07-06 MED ORDER — CICLOPIROX 8 % EX SOLN: Freq: Every day | CUTANEOUS | 2 refills | Status: DC

## 2023-07-06 MED ORDER — FARXIGA 10 MG PO TABS: 10.0000 mg | ORAL_TABLET | Freq: Every day | ORAL | 1 refills | Status: DC

## 2023-07-06 MED ORDER — ATORVASTATIN CALCIUM 20 MG PO TABS: 20.0000 mg | ORAL_TABLET | Freq: Every day | ORAL | 1 refills | Status: AC

## 2023-07-06 MED ORDER — APIXABAN 5 MG PO TABS
5.0000 mg | ORAL_TABLET | Freq: Two times a day (BID) | ORAL | 0 refills | Status: DC
Start: 2023-07-06 — End: 2023-09-15

## 2023-07-06 MED ORDER — ASPIRIN 81 MG PO TBEC: 81.0000 mg | DELAYED_RELEASE_TABLET | Freq: Every day | ORAL | 3 refills | Status: DC

## 2023-07-06 MED ORDER — ALBUTEROL SULFATE HFA 108 (90 BASE) MCG/ACT IN AERS
2.0000 | INHALATION_SPRAY | RESPIRATORY_TRACT | 0 refills | Status: AC | PRN
Start: 2023-07-06 — End: ?

## 2023-07-06 NOTE — Patient Instructions (Signed)
 [facility]  [address]  [address]  [place], [place] [address]  [phone]

## 2023-07-06 NOTE — Progress Notes (Signed)
 Assessment & Plan:   Donald [name] was seen today for annual exam.  Diagnoses and all orders for this visit:  Annual physical exam -     CBC with Differential/Platelet -     CMP14+EGFR -     Lipid panel  Diabetes mellitus treated with oral medication (HCC) -     Urine Albumin/Creatinine with ratio (send out) [LAB689] -     Hemoglobin A1c -     CMP14+EGFR -     Ambulatory referral to Ophthalmology -     Semaglutide ,0.25 or 0.5MG /DOS, (OZEMPIC , 0.25 OR 0.5 MG/DOSE,) 2 MG/3ML SOPN; Inject 0.5 mg into the skin once a week. -     FARXIGA  10 MG TABS tablet; Take 1 tablet (10 mg total) by mouth daily. -     atorvastatin  (LIPITOR) 20 MG tablet; Take 1 tablet (20 mg total) by mouth daily.  Need for pneumococcal 20-valent conjugate vaccination -     Pneumococcal conjugate vaccine 20-valent  High risk sexual behavior, unspecified type -     HIV antibody (with reflex) -     Urine cytology ancillary only -     RPR w/reflex to TrepSure  Acute on chronic combined systolic and diastolic CHF  Needs to follow up with Cardiology -     spironolactone  (ALDACTONE ) 25 MG tablet; Take 1 tablet (25 mg total) by mouth daily. -     aspirin  EC 81 MG tablet; Take 1 tablet (81 mg total) by mouth daily. Swallow whole. -     apixaban  (ELIQUIS ) 5 MG TABS tablet; Take 1 tablet (5 mg total) by mouth 2 (two) times daily.  Insomnia, unspecified type -     traZODone  (DESYREL ) 100 MG tablet; Take 0.5-1 tablets (50-100 mg total) by mouth at bedtime. FOR SLEEP  Onychomycosis -     ciclopirox  (PENLAC ) 8 % solution; Apply topically at bedtime. Apply over nail and surrounding skin. Apply daily over previous coat. After seven (7) days, may remove with alcohol and continue cycle.  Atrial fibrillation with RVR (HCC) -     aspirin  EC 81 MG tablet; Take 1 tablet (81 mg total) by mouth daily. Swallow whole. -     apixaban  (ELIQUIS ) 5 MG TABS tablet; Take 1 tablet (5 mg total) by mouth 2 (two) times daily.  Restrictive airway  disease Controlled -     albuterol  (VENTOLIN  HFA) 108 (90 Base) MCG/ACT inhaler; Inhale 2 puffs into the lungs every 4 (four) hours as needed for wheezing or shortness of breath.    Patient has been counseled on age-appropriate routine health concerns for screening and prevention. These are reviewed and up-to-date. Referrals have been placed accordingly. Immunizations are up-to-date or declined.    Subjective:   Chief Complaint  Patient presents with   Annual Exam    Donald [name] 63 y.o. male presents to office today for annual physical  He is overdue for cardiology follow up. Was scheduled for TEE in march however it does not appear this has been completed. Continues in afib with rate control. Metoprolol  was recently increased by Cardiology to 100 mg daily.   He has a past medical history of Anxiety, Arrhythmia, Arthritis, Atrial fibrillation, CHF , Chronic kidney disease, DM2,  Hypertension, and Stroke.           Review of Systems  Constitutional:  Negative for fever, malaise/fatigue and weight loss.  HENT: Negative.  Negative for nosebleeds.   Eyes: Negative.  Negative for blurred vision, double vision and photophobia.  Respiratory: Negative.  Negative for cough and shortness of breath.   Cardiovascular: Negative.  Negative for chest pain, palpitations and leg swelling.  Gastrointestinal: Negative.  Negative for heartburn, nausea and vomiting.  Genitourinary: Negative.   Musculoskeletal: Negative.  Negative for myalgias.  Skin: Negative.   Neurological: Negative.  Negative for dizziness, focal weakness, seizures and headaches.  Endo/Heme/Allergies: Negative.   Psychiatric/Behavioral: Negative.  Negative for suicidal ideas.     Past Medical History:  Diagnosis Date   A-fib [facility])    Anxiety    Arrhythmia    A fib    Arthritis    Atrial fibrillation (HCC)    CHF (congestive heart failure) (HCC)    Chronic kidney disease    COPD (chronic obstructive pulmonary  disease) (HCC)    Diabetes mellitus without complication (HCC)    Hypertension    Myocardial infarction [facility])    Stroke [facility])     Past Surgical History:  Procedure Laterality Date   ABLATION     CARDIOVERSION N/A 03/12/2020   Procedure: CARDIOVERSION;  Surgeon: Maudine Sos, MD;  Location: [facility] ENDOSCOPY;  Service: Cardiovascular;  Laterality: N/A;   CARDIOVERSION N/A 08/13/2020   Procedure: CARDIOVERSION;  Surgeon: Mardell Shade, MD;  Location: [facility] ENDOSCOPY;  Service: Cardiovascular;  Laterality: N/A;   CARDIOVERSION N/A 09/02/2020   Procedure: CARDIOVERSION;  Surgeon: Euell Herrlich, MD;  Location: [facility] ENDOSCOPY;  Service: Cardiovascular;  Laterality: N/A;   EYE SURGERY     INCISION AND DRAINAGE PERIRECTAL ABSCESS N/A 10/20/2012   Procedure: IRRIGATION AND DEBRIDEMENT PERIRECTAL ABSCESS;  Surgeon: Kari Otto. Eli Grizzle, MD;  Location: [facility] OR;  Service: General;  Laterality: N/A;  Lithotomy   RIGHT/LEFT HEART CATH AND CORONARY ANGIOGRAPHY N/A 08/12/2020   Procedure: RIGHT/LEFT HEART CATH AND CORONARY ANGIOGRAPHY;  Surgeon: Mardell Shade, MD;  Location: [facility] INVASIVE CV LAB;  Service: Cardiovascular;  Laterality: N/A;   TEE WITHOUT CARDIOVERSION N/A 03/12/2020   Procedure: TRANSESOPHAGEAL ECHOCARDIOGRAM (TEE);  Surgeon: Maudine Sos, MD;  Location: [facility] ENDOSCOPY;  Service: Cardiovascular;  Laterality: N/A;   TEE WITHOUT CARDIOVERSION N/A 08/13/2020   Procedure: TRANSESOPHAGEAL ECHOCARDIOGRAM (TEE);  Surgeon: Mardell Shade, MD;  Location: [facility] ENDOSCOPY;  Service: Cardiovascular;  Laterality: N/A;    Family History  Problem Relation Age of Onset   Breast cancer Mother    Heart attack Father 7   Esophageal cancer Maternal Aunt    Heart attack Paternal Uncle 72   Colon cancer Neg Hx    Rectal cancer Neg Hx    Stomach cancer Neg Hx     Social History Reviewed with no changes to be made today.   Outpatient Medications Prior to Visit  Medication Sig Dispense  Refill   Accu-Chek Softclix Lancets lancets Use as directed 100 each 0   albuterol  (PROVENTIL ) (2.5 MG/3ML) 0.083% nebulizer solution Take 3 mLs (2.5 mg total) by nebulization every 4 (four) hours as needed for wheezing or shortness of breath. 30 vial 0   albuterol  (VENTOLIN  HFA) 108 (90 Base) MCG/ACT inhaler Inhale 2 puffs into the lungs every 4 (four) hours as needed for shortness of breath. 18 g 1   amiodarone  (PACERONE ) 200 MG tablet Take 1 tablet (200 mg total) by mouth 2 (two) times daily. 180 tablet 0   ammonium lactate  (AMLACTIN) 12 % lotion Apply 1 Application topically as needed for dry skin. 400 g 0   digoxin  (LANOXIN ) 0.125 MG tablet TAKE 1/2 TABLET (0.125 MG TOTAL) BY MOUTH DAILY (AM) 15 tablet 0  metFORMIN  (GLUCOPHAGE ) 500 MG tablet TAKE ONE TABLET BY MOUTH TWICE DAILY WITH MEALS ( AM+PM) 180 tablet 3   metoprolol  succinate (TOPROL -XL) 100 MG 24 hr tablet Take 100 mg by mouth daily.     mometasone -formoterol  (DULERA ) 100-5 MCG/ACT AERO Inhale 1 puff into the lungs daily.     sacubitril -valsartan  (ENTRESTO ) 49-51 MG TAKE ONE TABLET BY MOUTH TWICE DAILY (AM+PM) 60 tablet 0   tadalafil  (CIALIS ) 20 MG tablet Take 0.5-1 tablets (10-20 mg total) by mouth every other day as needed for erectile dysfunction. 10 tablet 11   torsemide  (DEMADEX ) 20 MG tablet TAKE 1 TABLET (20 MG TOTAL) BY MOUTH DAILY (AM) 30 tablet 0   traMADol  (ULTRAM ) 50 MG tablet Take 1 tablet (50 mg total) by mouth every 8 (eight) hours as needed. 30 tablet 0   albuterol  (VENTOLIN  HFA) 108 (90 Base) MCG/ACT inhaler Inhale 2 puffs into the lungs every 4 (four) hours as needed for wheezing or shortness of breath. 6.7 g 0   apixaban  (ELIQUIS ) 5 MG TABS tablet TAKE ONE TABLET BY MOUTH TWICE DAILY (AM+PM) 180 tablet 0   atorvastatin  (LIPITOR) 20 MG tablet TAKE ONE TABLET BY MOUTH ONCE DAILY (EVENING) 30 tablet 0   FARXIGA  10 MG TABS tablet TAKE ONE TABLET BY MOUTH EVERY MORNING BEFORE BREAKFAST (AM) 30 tablet 0   Semaglutide ,0.25  or 0.5MG /DOS, (OZEMPIC , 0.25 OR 0.5 MG/DOSE,) 2 MG/3ML SOPN INJECT 0.5 MG INTO THE SKIN ONCE A WEEK. 3 mL 0   spironolactone  (ALDACTONE ) 25 MG tablet TAKE ONE TABLET BY MOUTH ONCE DAILY (AM) 30 tablet 0   traZODone  (DESYREL ) 100 MG tablet TAKE 1/2 TO 1 TABLET BY MOUTH AT BEDTIME 90 tablet 0   benzonatate  (TESSALON ) 100 MG capsule Take 1 capsule (100 mg total) by mouth every 8 (eight) hours. (Patient not taking: Reported on 07/06/2023) 21 capsule 0   Blood Glucose Monitoring Suppl (BLOOD GLUCOSE MONITOR SYSTEM) w/Device KIT USE AS DIRECTED (Patient not taking: Reported on 11/12/2022) 1 kit 0   Blood Pressure Monitor DEVI Please provide patient with insurance approved blood pressure monitor ICD 10  I10 (Patient not taking: Reported on 11/12/2022) 1 each 0   ciclopirox  (PENLAC ) 8 % solution Apply topically at bedtime. Apply over nail and surrounding skin. Apply daily over previous coat. After seven (7) days, may remove with alcohol and continue cycle. (Patient not taking: Reported on 07/06/2023) 6.6 mL 2   loperamide  (IMODIUM ) 2 MG capsule Take 1 capsule (2 mg total) by mouth 4 (four) times daily as needed for diarrhea or loose stools. (Patient not taking: Reported on 07/06/2023) 12 capsule 0   No facility-administered medications prior to visit.    Allergies  Allergen Reactions   Lisinopril  Cough       Objective:    BP 128/83 (BP Location: Right Arm, Patient Position: Sitting, Cuff Size: Large)   Pulse 95   Resp 18   Ht 6\' 2"  (1.88 m)   Wt 296 lb 9.6 oz (134.5 kg)   SpO2 100%   BMI 38.08 kg/m  Wt Readings from Last 3 Encounters:  07/06/23 296 lb 9.6 oz (134.5 kg)  11/12/22 298 lb (135.2 kg)  10/29/22 298 lb (135.2 kg)    Physical Exam Constitutional:      Appearance: He is well-developed.  HENT:     Head: Normocephalic and atraumatic.     Right Ear: Hearing, tympanic membrane, ear canal and external ear normal.     Left Ear: Hearing, tympanic membrane, ear canal  and external ear  normal.     Nose: Nose normal. No mucosal edema or rhinorrhea.     Right Turbinates: Not enlarged.     Left Turbinates: Not enlarged.     Mouth/Throat:     Lips: Pink.     Mouth: Mucous membranes are moist.     Dentition: No gingival swelling, dental abscesses or gum lesions.     Pharynx: Uvula midline.     Tonsils: No tonsillar exudate. 1+ on the right. 1+ on the left.  Eyes:     General: Lids are normal. No scleral icterus.    Extraocular Movements: Extraocular movements intact.     Conjunctiva/sclera: Conjunctivae normal.     Pupils: Pupils are equal, round, and reactive to light.  Neck:     Thyroid: No thyromegaly.     Trachea: No tracheal deviation.  Cardiovascular:     Rate and Rhythm: Normal rate. Rhythm irregular.     Heart sounds: Normal heart sounds. No murmur heard.    No friction rub. No gallop.  Pulmonary:     Effort: Pulmonary effort is normal. No respiratory distress.     Breath sounds: Normal breath sounds. No wheezing or rales.  Chest:     Chest wall: No mass or tenderness.  Breasts:    Right: No inverted nipple, mass, nipple discharge, skin change or tenderness.     Left: No inverted nipple, mass, nipple discharge, skin change or tenderness.  Abdominal:     General: Bowel sounds are normal. There is no distension.     Palpations: Abdomen is soft. There is no mass.     Tenderness: There is no abdominal tenderness. There is no guarding or rebound.  Musculoskeletal:        General: No tenderness or deformity. Normal range of motion.     Cervical back: Normal range of motion and neck supple.  Feet:     Right foot:     Toenail Condition: Fungal disease present.    Left foot:     Toenail Condition: Fungal disease present. Lymphadenopathy:     Cervical: No cervical adenopathy.  Skin:    General: Skin is warm and dry.     Capillary Refill: Capillary refill takes less than 2 seconds.     Findings: No erythema.  Neurological:     Mental Status: He is alert and  oriented to person, place, and time.     Cranial Nerves: No cranial nerve deficit.     Sensory: Sensation is intact.     Motor: No abnormal muscle tone.     Coordination: Coordination is intact. Coordination normal.     Gait: Gait is intact.     Deep Tendon Reflexes: Reflexes normal.     Reflex Scores:      Patellar reflexes are 1+ on the right side and 1+ on the left side. Psychiatric:        Attention and Perception: Attention normal.        Mood and Affect: Mood normal.        Speech: Speech normal.        Behavior: Behavior normal.        Thought Content: Thought content normal.        Judgment: Judgment normal.          Patient has been counseled extensively about nutrition and exercise as well as the importance of adherence with medications and regular follow-up. The patient was given clear instructions to go to ER  or return to medical center if symptoms don't improve, worsen or new problems develop. The patient verbalized understanding.   Follow-up: Return in about 3 months (around 10/06/2023).   Collins Dean, FNP-BC [facility] [place], [place] [phone]   07/06/2023, 10:17 PM

## 2023-07-08 LAB — CMP14+EGFR
ALT: 15 IU/L (ref 0–44)
AST: 16 IU/L (ref 0–40)
Albumin: 4.3 g/dL (ref 3.9–4.9)
Alkaline Phosphatase: 90 IU/L (ref 44–121)
BUN/Creatinine Ratio: 12 (ref 10–24)
BUN: 14 mg/dL (ref 8–27)
Bilirubin Total: 0.5 mg/dL (ref 0.0–1.2)
CO2: 20 mmol/L (ref 20–29)
Calcium: 9.7 mg/dL (ref 8.6–10.2)
Chloride: 102 mmol/L (ref 96–106)
Creatinine, Ser: 1.16 mg/dL (ref 0.76–1.27)
Globulin, Total: 2.7 g/dL (ref 1.5–4.5)
Glucose: 136 mg/dL — ABNORMAL HIGH (ref 70–99)
Potassium: 4 mmol/L (ref 3.5–5.2)
Sodium: 143 mmol/L (ref 134–144)
Total Protein: 7 g/dL (ref 6.0–8.5)
eGFR: 72 mL/min/{1.73_m2} (ref >59)

## 2023-07-08 LAB — CBC WITH DIFFERENTIAL/PLATELET
Basophils Absolute: 0.1 10*3/uL (ref 0.0–0.2)
Basos: 1 %
EOS (ABSOLUTE): 0.2 10*3/uL (ref 0.0–0.4)
Eos: 3 %
Hematocrit: 49.4 % (ref 37.5–51.0)
Hemoglobin: 16 g/dL (ref 13.0–17.7)
Immature Grans (Abs): 0 10*3/uL (ref 0.0–0.1)
Immature Granulocytes: 0 %
Lymphocytes Absolute: 1.5 10*3/uL (ref 0.7–3.1)
Lymphs: 21 %
MCH: 28.1 pg (ref 26.6–33.0)
MCHC: 32.4 g/dL (ref 31.5–35.7)
MCV: 87 fL (ref 79–97)
Monocytes Absolute: 0.6 10*3/uL (ref 0.1–0.9)
Monocytes: 8 %
Neutrophils Absolute: 4.9 10*3/uL (ref 1.4–7.0)
Neutrophils: 67 %
Platelets: 224 10*3/uL (ref 150–450)
RBC: 5.7 x10E6/uL (ref 4.14–5.80)
RDW: 14.6 % (ref 11.6–15.4)
WBC: 7.3 10*3/uL (ref 3.4–10.8)

## 2023-07-08 LAB — MICROALBUMIN / CREATININE URINE RATIO
Creatinine, Urine: 205.9 mg/dL
Microalb/Creat Ratio: 4 mg/g{creat} (ref 0–29)
Microalbumin, Urine: 7.9 ug/mL

## 2023-07-08 LAB — HEMOGLOBIN A1C
Est. average glucose Bld gHb Est-mCnc: 189 mg/dL
Hgb A1c MFr Bld: 8.2 % — ABNORMAL HIGH (ref 4.8–5.6)

## 2023-07-08 LAB — LIPID PANEL
Chol/HDL Ratio: 3.9 ratio (ref 0.0–5.0)
Cholesterol, Total: 154 mg/dL (ref 100–199)
HDL: 39 mg/dL — ABNORMAL LOW (ref >39)
LDL Chol Calc (NIH): 85 mg/dL (ref 0–99)
Triglycerides: 172 mg/dL — ABNORMAL HIGH (ref 0–149)
VLDL Cholesterol Cal: 30 mg/dL (ref 5–40)

## 2023-07-08 LAB — URINE CYTOLOGY ANCILLARY ONLY
Chlamydia: NEGATIVE
Comment: NEGATIVE
Comment: NEGATIVE
Comment: NORMAL
Neisseria Gonorrhea: NEGATIVE
Trichomonas: NEGATIVE

## 2023-07-08 LAB — TREPONEMAL ANTIBODIES, TPPA: Treponemal Antibodies, TPPA: NONREACTIVE

## 2023-07-08 LAB — HIV ANTIBODY (ROUTINE TESTING W REFLEX): HIV Screen 4th Generation wRfx: NONREACTIVE

## 2023-07-08 LAB — RPR W/REFLEX TO TREPSURE

## 2023-07-09 ENCOUNTER — Other Ambulatory Visit (HOSPITAL_COMMUNITY): Payer: Self-pay | Admitting: Nurse Practitioner

## 2023-07-12 ENCOUNTER — Ambulatory Visit: Payer: Self-pay | Admitting: Nurse Practitioner

## 2023-08-25 ENCOUNTER — Other Ambulatory Visit: Payer: Self-pay

## 2023-09-02 ENCOUNTER — Telehealth: Payer: Self-pay

## 2023-09-02 NOTE — Telephone Encounter (Signed)
 Copied from CRM #[id number]. Topic: General - Other >> Sep 02, 2023 10:14 AM Jasmin G wrote: Reason for CRM: Pt called regarding recent paperwork that she dropped off last Monday to be filled out by clinic, she would appreciate a call back at [phone] from clinic to let her know when the paperwork will be ready to pick up.

## 2023-09-08 ENCOUNTER — Telehealth: Payer: Self-pay | Admitting: Nurse Practitioner

## 2023-09-08 NOTE — Telephone Encounter (Signed)
 Pt came by the office regarding his paperwork he dropped off

## 2023-09-14 ENCOUNTER — Other Ambulatory Visit: Payer: Self-pay | Admitting: Nurse Practitioner

## 2023-09-14 DIAGNOSIS — N529 Male erectile dysfunction, unspecified: Secondary | ICD-10-CM

## 2023-09-14 DIAGNOSIS — I4891 Unspecified atrial fibrillation: Secondary | ICD-10-CM

## 2023-09-14 DIAGNOSIS — I5043 Acute on chronic combined systolic (congestive) and diastolic (congestive) heart failure: Secondary | ICD-10-CM

## 2023-09-14 DIAGNOSIS — M170 Bilateral primary osteoarthritis of knee: Secondary | ICD-10-CM

## 2023-09-15 NOTE — Telephone Encounter (Signed)
 Patient came in 8/19 and was asking the status of his paperwork for school.

## 2023-09-17 ENCOUNTER — Encounter: Payer: Self-pay | Admitting: Nurse Practitioner

## 2023-09-17 NOTE — Telephone Encounter (Signed)
 Unable to reach patient by phone

## 2023-09-17 NOTE — Telephone Encounter (Signed)
 I tried to call him and LVM. Please schedule him for a virtual visit today if he calls back or let's try to reach out to him again later this morning.

## 2023-09-20 NOTE — Telephone Encounter (Signed)
 Called patient left detailed message to call back to get a virtual appointment set up.

## 2023-09-20 NOTE — Telephone Encounter (Addendum)
 Patient came in the office today, Patient has been scheduled for a Mychart video visit for tomorrow at 1:30 pm. Patient acknowledged appointment.

## 2023-09-21 ENCOUNTER — Telehealth (HOSPITAL_BASED_OUTPATIENT_CLINIC_OR_DEPARTMENT_OTHER): Admitting: Nurse Practitioner

## 2023-09-21 ENCOUNTER — Encounter: Payer: Self-pay | Admitting: Nurse Practitioner

## 2023-09-21 DIAGNOSIS — I69320 Aphasia following cerebral infarction: Secondary | ICD-10-CM | POA: Diagnosis not present

## 2023-09-21 NOTE — Progress Notes (Signed)
 Virtual Visit Consent   Donald [name], you are scheduled for a virtual visit with a [facility] provider today. Just as with appointments in the office, your consent must be obtained to participate. Your consent will be active for this visit and any virtual visit you may have with one of our providers in the next 365 days. If you have a MyChart account, a copy of this consent can be sent to you electronically.  As this is a virtual visit, video technology does not allow for your provider to perform a traditional examination. This may limit your provider's ability to fully assess your condition. If your provider identifies any concerns that need to be evaluated in person or the need to arrange testing (such as labs, EKG, etc.), we will make arrangements to do so. Although advances in technology are sophisticated, we cannot ensure that it will always work on either your end or our end. If the connection with a video visit is poor, the visit may have to be switched to a telephone visit. With either a video or telephone visit, we are not always able to ensure that we have a secure connection.  By engaging in this virtual visit, you consent to the provision of healthcare and authorize for your insurance to be billed (if applicable) for the services provided during this visit. Depending on your insurance coverage, you may receive a charge related to this service.  I need to obtain your verbal consent now. Are you willing to proceed with your visit today? Donald [name] has provided verbal consent on 09/21/2023 for a virtual visit (video or telephone). Donald LELON Servant, NP  Date: 09/21/2023 1:49 PM   Virtual Visit via Video Note   I, Donald [name], connected with  Donald [name]  ([id number], 1960/02/12) on 09/21/23 at  1:30 PM EDT by a video-enabled telemedicine application and verified that I am speaking with the correct person using two identifiers.  Location: Patient: Virtual Visit Location Patient:  Home Provider: Virtual Visit Location Provider: Home Office   I discussed the limitations of evaluation and management by telemedicine and the availability of in person appointments. The patient expressed understanding and agreed to proceed.    History of Present Illness: Donald [name] is a 63 y.o. who identifies as a male who was assigned male at birth, and is being seen today for paperwork completion. Donald [name] has a history of stroke with aphasia and memory loss. He is requesting school accommodation form to be completed today to return to his Donald [name].  He requires additional time taking test and with getting to classes due to physical and related to his stroke.  Problems:  Patient Active Problem List   Diagnosis Date Noted   Morbid obesity (HCC) 12/26/2021   Polycythemia 12/26/2021   AKI (acute kidney injury) (HCC) 12/25/2021   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 03/31/2021   Aphasia as late effect of cerebrovascular accident (CVA) 03/31/2021   Secondary hypercoagulable state (HCC) 08/29/2020   OSA on CPAP 08/06/2020   Acute pulmonary embolism (HCC) 08/06/2020   Atrial fibrillation with RVR (HCC) 03/08/2020   Pulmonary embolism (HCC) 03/08/2020   Essential hypertension 03/08/2020   COPD (chronic obstructive pulmonary disease) (HCC) 03/08/2020   Anemia 03/08/2020   COPD exacerbation (HCC) 11/16/2014   Atypical chest pain 11/16/2014   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   Paroxysmal atrial fibrillation (HCC) 11/16/2014   Abnormal EKG 11/16/2014   Type 2 diabetes mellitus  without complication (HCC) 11/17/2012   Perirectal abscess 10/20/2012    Allergies:  Allergies  Allergen Reactions   Lisinopril  Cough   Medications:  Current Outpatient Medications:    Accu-Chek Softclix Lancets lancets, Use as directed, Disp: 100 each, Rfl: 0   albuterol  (PROVENTIL ) (2.5 MG/3ML) 0.083% nebulizer solution, Take 3 mLs (2.5 mg total) by nebulization every 4  (four) hours as needed for wheezing or shortness of breath., Disp: 30 vial, Rfl: 0   albuterol  (VENTOLIN  HFA) 108 (90 Base) MCG/ACT inhaler, Inhale 2 puffs into the lungs every 4 (four) hours as needed for shortness of breath., Disp: 18 g, Rfl: 1   albuterol  (VENTOLIN  HFA) 108 (90 Base) MCG/ACT inhaler, Inhale 2 puffs into the lungs every 4 (four) hours as needed for wheezing or shortness of breath., Disp: 6.7 g, Rfl: 0   amiodarone  (PACERONE ) 200 MG tablet, Take 1 tablet (200 mg total) by mouth 2 (two) times daily., Disp: 180 tablet, Rfl: 0   ammonium lactate  (AMLACTIN) 12 % lotion, Apply 1 Application topically as needed for dry skin., Disp: 400 g, Rfl: 0   aspirin  EC 81 MG tablet, Take 1 tablet (81 mg total) by mouth daily. Swallow whole., Disp: 90 tablet, Rfl: 3   atorvastatin  (LIPITOR) 20 MG tablet, Take 1 tablet (20 mg total) by mouth daily., Disp: 90 tablet, Rfl: 1   ciclopirox  (PENLAC ) 8 % solution, Apply topically at bedtime. Apply over nail and surrounding skin. Apply daily over previous coat. After seven (7) days, may remove with alcohol and continue cycle., Disp: 6.6 mL, Rfl: 2   digoxin  (LANOXIN ) 0.125 MG tablet, TAKE 1/2 TABLET (0.125 MG TOTAL) BY MOUTH DAILY (AM), Disp: 15 tablet, Rfl: 0   ELIQUIS  5 MG TABS tablet, TAKE 1 TABLET (5 MG TOTAL) BY MOUTH 2 (TWO) TIMES DAILY. (AM+PM), Disp: 180 tablet, Rfl: 0   FARXIGA  10 MG TABS tablet, Take 1 tablet (10 mg total) by mouth daily., Disp: 90 tablet, Rfl: 1   metFORMIN  (GLUCOPHAGE ) 500 MG tablet, TAKE ONE TABLET BY MOUTH TWICE DAILY WITH MEALS ( AM+PM), Disp: 180 tablet, Rfl: 3   metoprolol  succinate (TOPROL -XL) 100 MG 24 hr tablet, Take 100 mg by mouth daily., Disp: , Rfl:    mometasone -formoterol  (DULERA ) 100-5 MCG/ACT AERO, Inhale 1 puff into the lungs daily., Disp: , Rfl:    sacubitril -valsartan  (ENTRESTO ) 49-51 MG, TAKE ONE TABLET BY MOUTH TWICE DAILY (AM+PM), Disp: 180 tablet, Rfl: 1   Semaglutide ,0.25 or 0.5MG /DOS, (OZEMPIC , 0.25 OR 0.5  MG/DOSE,) 2 MG/3ML SOPN, Inject 0.5 mg into the skin once a week., Disp: 3 mL, Rfl: 3   spironolactone  (ALDACTONE ) 25 MG tablet, Take 1 tablet (25 mg total) by mouth daily., Disp: 90 tablet, Rfl: 1   tadalafil  (CIALIS ) 20 MG tablet, TAKE 1/2 TO 1 TABLETS (10-20 MG TOTAL) BY MOUTH EVERY OTHER DAY AS NEEDED FOR ERECTILE DYSFUNCTION., Disp: 10 tablet, Rfl: 11   torsemide  (DEMADEX ) 20 MG tablet, TAKE 1 TABLET (20 MG TOTAL) BY MOUTH DAILY (AM), Disp: 30 tablet, Rfl: 0   traMADol  (ULTRAM ) 50 MG tablet, Take 1 tablet (50 mg total) by mouth every 8 (eight) hours as needed., Disp: 30 tablet, Rfl: 0   traZODone  (DESYREL ) 100 MG tablet, Take 0.5-1 tablets (50-100 mg total) by mouth at bedtime. FOR SLEEP, Disp: 90 tablet, Rfl: 1  Observations/Objective: Patient is well-developed, well-nourished in no acute distress.  Resting comfortably at home.  Head is normocephalic, atraumatic.  No labored breathing.  Speech is clear and coherent  with logical content.  Patient is alert and oriented at baseline.    Assessment and Plan: 1. Aphasia as late effect of cerebrovascular accident (CVA) (Primary) Paperwork to be filled out for [employer]  Follow Up Instructions: I discussed the assessment and treatment plan with the patient. The patient was provided an opportunity to ask questions and all were answered. The patient agreed with the plan and demonstrated an understanding of the instructions.  A copy of instructions were sent to the patient via MyChart unless otherwise noted below.    The patient was advised to call back or seek an in-person evaluation if the symptoms worsen or if the condition fails to improve as anticipated.    Osaze Hubbert W Sophira Rumler, NP

## 2023-10-05 ENCOUNTER — Telehealth: Payer: Self-pay | Admitting: Nurse Practitioner

## 2023-10-05 NOTE — Telephone Encounter (Signed)
Pt confirmed appt 9/10

## 2023-10-06 ENCOUNTER — Encounter: Payer: Self-pay | Admitting: Nurse Practitioner

## 2023-10-06 ENCOUNTER — Ambulatory Visit: Attending: Nurse Practitioner | Admitting: Nurse Practitioner

## 2023-10-06 ENCOUNTER — Other Ambulatory Visit: Payer: Self-pay

## 2023-10-06 VITALS — BP 129/85 | HR 100 | Resp 19 | Ht 74.0 in | Wt 294.4 lb

## 2023-10-06 DIAGNOSIS — E1165 Type 2 diabetes mellitus with hyperglycemia: Secondary | ICD-10-CM

## 2023-10-06 DIAGNOSIS — I10 Essential (primary) hypertension: Secondary | ICD-10-CM | POA: Diagnosis not present

## 2023-10-06 DIAGNOSIS — E119 Type 2 diabetes mellitus without complications: Secondary | ICD-10-CM

## 2023-10-06 DIAGNOSIS — Z7984 Long term (current) use of oral hypoglycemic drugs: Secondary | ICD-10-CM

## 2023-10-06 DIAGNOSIS — M25562 Pain in left knee: Secondary | ICD-10-CM

## 2023-10-06 DIAGNOSIS — G8929 Other chronic pain: Secondary | ICD-10-CM

## 2023-10-06 DIAGNOSIS — M25561 Pain in right knee: Secondary | ICD-10-CM

## 2023-10-06 DIAGNOSIS — M170 Bilateral primary osteoarthritis of knee: Secondary | ICD-10-CM

## 2023-10-06 LAB — POCT GLYCOSYLATED HEMOGLOBIN (HGB A1C): Hemoglobin A1C: 8.3 % — AB (ref 4.0–5.6)

## 2023-10-06 MED ORDER — FREESTYLE LIBRE 3 READER DEVI
0 refills | Status: AC
  Filled 2023-10-06: qty 1, 30d supply, fill #0

## 2023-10-06 MED ORDER — FREESTYLE LIBRE 3 PLUS SENSOR MISC
6 refills | Status: AC
  Filled 2023-10-06: qty 2, 30d supply, fill #0
  Filled 2023-11-01: qty 2, 30d supply, fill #1

## 2023-10-06 MED ORDER — OZEMPIC (0.25 OR 0.5 MG/DOSE) 2 MG/3ML ~~LOC~~ SOPN: 0.5000 mg | PEN_INJECTOR | SUBCUTANEOUS | 3 refills | Status: DC

## 2023-10-06 NOTE — Patient Instructions (Signed)
 [facility] [address]  [phone]
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
## 2023-10-07 ENCOUNTER — Other Ambulatory Visit: Payer: Self-pay
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
## 2023-10-08 ENCOUNTER — Telehealth: Payer: Self-pay

## 2023-10-08 NOTE — Telephone Encounter (Signed)
 Pharmacy Patient Advocate Encounter  Received notification from [facility] MEDICAID that Prior Authorization for FREESTYLE LIBRE 3 PLUS SENSOR has been APPROVED from 10/07/2023 to 04/05/2024   PA #/Case ID/Reference #: [id number]

## 2023-10-12 ENCOUNTER — Other Ambulatory Visit: Payer: Self-pay | Admitting: Nurse Practitioner

## 2023-10-12 DIAGNOSIS — B351 Tinea unguium: Secondary | ICD-10-CM

## 2023-11-01 ENCOUNTER — Other Ambulatory Visit: Payer: Self-pay

## 2023-12-22 ENCOUNTER — Other Ambulatory Visit: Payer: Self-pay | Admitting: Nurse Practitioner

## 2023-12-22 ENCOUNTER — Other Ambulatory Visit: Payer: Self-pay | Admitting: Family Medicine

## 2023-12-22 DIAGNOSIS — I5043 Acute on chronic combined systolic (congestive) and diastolic (congestive) heart failure: Secondary | ICD-10-CM

## 2023-12-22 DIAGNOSIS — M170 Bilateral primary osteoarthritis of knee: Secondary | ICD-10-CM

## 2023-12-22 DIAGNOSIS — I10 Essential (primary) hypertension: Secondary | ICD-10-CM

## 2023-12-22 DIAGNOSIS — I4891 Unspecified atrial fibrillation: Secondary | ICD-10-CM

## 2023-12-22 DIAGNOSIS — G4700 Insomnia, unspecified: Secondary | ICD-10-CM

## 2023-12-22 DIAGNOSIS — E119 Type 2 diabetes mellitus without complications: Secondary | ICD-10-CM

## 2024-01-05 ENCOUNTER — Ambulatory Visit: Attending: Nurse Practitioner | Admitting: Nurse Practitioner

## 2024-01-05 ENCOUNTER — Encounter: Payer: Self-pay | Admitting: Nurse Practitioner

## 2024-01-05 ENCOUNTER — Other Ambulatory Visit (HOSPITAL_COMMUNITY)
Admission: RE | Admit: 2024-01-05 | Discharge: 2024-01-05 | Disposition: A | Discharge status: Home / Self Care | Source: Ambulatory Visit | Attending: Nurse Practitioner | Admitting: Nurse Practitioner

## 2024-01-05 VITALS — BP 133/91 | HR 122 | Resp 19 | Ht 74.0 in | Wt 298.0 lb

## 2024-01-05 DIAGNOSIS — I5043 Acute on chronic combined systolic (congestive) and diastolic (congestive) heart failure: Secondary | ICD-10-CM | POA: Diagnosis not present

## 2024-01-05 DIAGNOSIS — Z7985 Long-term (current) use of injectable non-insulin antidiabetic drugs: Secondary | ICD-10-CM | POA: Diagnosis not present

## 2024-01-05 DIAGNOSIS — E119 Type 2 diabetes mellitus without complications: Secondary | ICD-10-CM | POA: Diagnosis not present

## 2024-01-05 DIAGNOSIS — Z23 Encounter for immunization: Secondary | ICD-10-CM | POA: Diagnosis not present

## 2024-01-05 DIAGNOSIS — I10 Essential (primary) hypertension: Secondary | ICD-10-CM

## 2024-01-05 DIAGNOSIS — Z7251 High risk heterosexual behavior: Secondary | ICD-10-CM

## 2024-01-05 LAB — POCT GLYCOSYLATED HEMOGLOBIN (HGB A1C): Hemoglobin A1C: 7.9 % — AB (ref 4.0–5.6)

## 2024-01-05 MED ORDER — SEMAGLUTIDE (1 MG/DOSE) 4 MG/3ML ~~LOC~~ SOPN: 1.0000 mg | PEN_INJECTOR | SUBCUTANEOUS | 2 refills | Status: AC

## 2024-01-05 NOTE — Progress Notes (Unsigned)
 Assessment & Plan:  Donald [name] was seen today for diabetes.  Diagnoses and all orders for this visit:  Diabetes mellitus treated with injections of non-insulin  medication (HCC) -     POCT glycosylated hemoglobin (Hb A1C) -     Semaglutide , 1 MG/DOSE, 4 MG/3ML SOPN; Inject 1 mg as directed once a week. -     CMP14+EGFR  Need for influenza vaccination -     Flu vaccine trivalent PF, 6mos and older(Flulaval,Afluria,Fluarix,Fluzone)  Acute on chronic combined systolic and diastolic CHF (congestive heart failure) (HCC) -     EKG 12-Lead  High risk heterosexual behavior -     Urine cytology ancillary only -     HCV Ab w Reflex to Quant PCR -     HIV Antibody (routine testing w rflx) -     RPR W/RFLX TO RPR TITER, TREPONEMAL AB, SCREEN AND DIAGNOSIS    Patient has been counseled on age-appropriate routine health concerns for screening and prevention. These are reviewed and up-to-date. Referrals have been placed accordingly. Immunizations are up-to-date or declined.    Subjective:   Chief Complaint  Patient presents with   Diabetes    Donald [name] 63 y.o. male presents to office today   . Atrial fibrillation with rapid ventricular response (HCC) -Atrial fibrillation today with a rate of 124 -I am going to increase his metoprolol  from 50 mg daily to 100 mg daily -We will continue to work on his left atrial appendage thrombus -At the rate we are going if we can resolve his thrombus we may need to set him up with CT surgery to have a left atrial appendage ligation as well as a maze procedure. - Transesophageal echo (TEE); Future  3. Thrombus of left atrial appendage -He is currently on Eliquis  5 mg twice daily. He reports compliance. -I did educate him on the importance of taking this medication. -For now we are going to add ASA 81 mg. -We will recheck a TEE in about 2 months. If the clot remains then we will need to set him up with Coumadin.    ROS  Past Medical History:   Diagnosis Date   A-fib [facility])    Anxiety    Arrhythmia    A fib    Arthritis    Atrial fibrillation (HCC)    CHF (congestive heart failure) (HCC)    Chronic kidney disease    COPD (chronic obstructive pulmonary disease) (HCC)    Diabetes mellitus without complication (HCC)    Hypertension    Myocardial infarction [facility])    Stroke [facility])     Past Surgical History:  Procedure Laterality Date   ABLATION     CARDIOVERSION N/A 03/12/2020   Procedure: CARDIOVERSION;  Surgeon: Raford Riggs, MD;  Location: [facility] ENDOSCOPY;  Service: Cardiovascular;  Laterality: N/A;   CARDIOVERSION N/A 08/13/2020   Procedure: CARDIOVERSION;  Surgeon: Cherrie Toribio SAUNDERS, MD;  Location: [facility] ENDOSCOPY;  Service: Cardiovascular;  Laterality: N/A;   CARDIOVERSION N/A 09/02/2020   Procedure: CARDIOVERSION;  Surgeon: Loni Soyla LABOR, MD;  Location: [facility] ENDOSCOPY;  Service: Cardiovascular;  Laterality: N/A;   EYE SURGERY     INCISION AND DRAINAGE PERIRECTAL ABSCESS N/A 10/20/2012   Procedure: IRRIGATION AND DEBRIDEMENT PERIRECTAL ABSCESS;  Surgeon: Donnice POUR. Belinda, MD;  Location: [facility] OR;  Service: General;  Laterality: N/A;  Lithotomy   RIGHT/LEFT HEART CATH AND CORONARY ANGIOGRAPHY N/A 08/12/2020   Procedure: RIGHT/LEFT HEART CATH AND CORONARY ANGIOGRAPHY;  Surgeon: Cherrie Toribio SAUNDERS, MD;  Location: [facility] INVASIVE CV LAB;  Service: Cardiovascular;  Laterality: N/A;   TEE WITHOUT CARDIOVERSION N/A 03/12/2020   Procedure: TRANSESOPHAGEAL ECHOCARDIOGRAM (TEE);  Surgeon: Raford Riggs, MD;  Location: [facility] ENDOSCOPY;  Service: Cardiovascular;  Laterality: N/A;   TEE WITHOUT CARDIOVERSION N/A 08/13/2020   Procedure: TRANSESOPHAGEAL ECHOCARDIOGRAM (TEE);  Surgeon: Cherrie Toribio SAUNDERS, MD;  Location: [facility] ENDOSCOPY;  Service: Cardiovascular;  Laterality: N/A;    Family History  Problem Relation Age of Onset   Breast cancer Mother    Heart attack Father 83   Esophageal cancer Maternal Aunt    Heart attack Paternal  Uncle 69   Colon cancer Neg Hx    Rectal cancer Neg Hx    Stomach cancer Neg Hx     Social History Reviewed with no changes to be made today.   Outpatient Medications Prior to Visit  Medication Sig Dispense Refill   Accu-Chek Softclix Lancets lancets Use as directed 100 each 0   albuterol  (PROVENTIL ) (2.5 MG/3ML) 0.083% nebulizer solution Take 3 mLs (2.5 mg total) by nebulization every 4 (four) hours as needed for wheezing or shortness of breath. 30 vial 0   albuterol  (VENTOLIN  HFA) 108 (90 Base) MCG/ACT inhaler Inhale 2 puffs into the lungs every 4 (four) hours as needed for shortness of breath. 18 g 1   albuterol  (VENTOLIN  HFA) 108 (90 Base) MCG/ACT inhaler Inhale 2 puffs into the lungs every 4 (four) hours as needed for wheezing or shortness of breath. 6.7 g 0   amiodarone  (PACERONE ) 200 MG tablet Take 1 tablet (200 mg total) by mouth 2 (two) times daily. 180 tablet 0   ammonium lactate  (AMLACTIN) 12 % lotion Apply 1 Application topically as needed for dry skin. 400 g 0   atorvastatin  (LIPITOR) 20 MG tablet Take 1 tablet (20 mg total) by mouth daily. 90 tablet 1   ciclopirox  (PENLAC ) 8 % solution APPLY TOPICALLY AT BEDTIME ONE DAILY. APPLY OVER NAIL AND SURROUNDING SKIN. AFTER SEVEN (7) DAYS, MAY REMOVE WITH ALCOHOL AND CONTINUE CYCLE. 6.6 mL 2   Continuous Glucose Receiver (FREESTYLE LIBRE 3 READER) DEVI Check blood glucose levels continuously Z79.84 1 each 0   Continuous Glucose Sensor (FREESTYLE LIBRE 3 PLUS SENSOR) MISC Change sensor every 15 days. Z79.84 2 each 6   digoxin  (LANOXIN ) 0.125 MG tablet TAKE 1/2 TABLET (0.125 MG TOTAL) BY MOUTH DAILY (AM) 15 tablet 0   ELIQUIS  5 MG TABS tablet TAKE 1 TABLET (5 MG TOTAL) BY MOUTH 2 (TWO) TIMES DAILY. (AM+PM) 180 tablet 0   FARXIGA  10 MG TABS tablet TAKE 1 TABLET (10 MG TOTAL) BY MOUTH DAILY. (AM) 90 tablet 1   metFORMIN  (GLUCOPHAGE ) 500 MG tablet TAKE ONE TABLET BY MOUTH TWICE DAILY WITH MEALS ( AM+PM) 180 tablet 3   metoprolol  succinate  (TOPROL -XL) 100 MG 24 hr tablet Take 100 mg by mouth daily.     mometasone -formoterol  (DULERA ) 100-5 MCG/ACT AERO Inhale 1 puff into the lungs daily.     sacubitril -valsartan  (ENTRESTO ) 49-51 MG TAKE ONE TABLET BY MOUTH TWICE DAILY (AM+PM) 180 tablet 1   spironolactone  (ALDACTONE ) 25 MG tablet Take 1 tablet (25 mg total) by mouth daily. 90 tablet 1   tadalafil  (CIALIS ) 20 MG tablet TAKE 1/2 TO 1 TABLETS (10-20 MG TOTAL) BY MOUTH EVERY OTHER DAY AS NEEDED FOR ERECTILE DYSFUNCTION. 10 tablet 11   torsemide  (DEMADEX ) 20 MG tablet TAKE 1 TABLET (20 MG TOTAL) BY MOUTH DAILY (AM) 30 tablet 0   traMADol  (ULTRAM ) 50 MG tablet TAKE  1 TABLET (50 MG TOTAL) BY MOUTH EVERY 8 (EIGHT) HOURS AS NEEDED. 30 tablet 0   traZODone  (DESYREL ) 100 MG tablet TAKE 1/2 TO 1 TABLET (50-100 MG TOTAL) BY MOUTH AT BEDTIME. FOR SLEEP 90 tablet 1   Semaglutide ,0.25 or 0.5MG /DOS, (OZEMPIC , 0.25 OR 0.5 MG/DOSE,) 2 MG/3ML SOPN Inject 0.5 mg into the skin once a week. 3 mL 3   No facility-administered medications prior to visit.    Allergies  Allergen Reactions   Lisinopril  Cough       Objective:    BP (!) 133/91 (BP Location: Left Arm, Patient Position: Sitting, Cuff Size: Normal)   Pulse (!) 122   Resp 19   Ht 6' 2 (1.88 m)   Wt 298 lb (135.2 kg)   SpO2 99%   BMI 38.26 kg/m  Wt Readings from Last 3 Encounters:  01/05/24 298 lb (135.2 kg)  10/06/23 294 lb 6.4 oz (133.5 kg)  07/06/23 296 lb 9.6 oz (134.5 kg)    Physical Exam       Patient has been counseled extensively about nutrition and exercise as well as the importance of adherence with medications and regular follow-up. The patient was given clear instructions to go to ER or return to medical center if symptoms don't improve, worsen or new problems develop. The patient verbalized understanding.   Follow-up: No follow-ups on file.   [name], FNP-BC [facility] [place], [place] [phone]   01/05/2024, 2:54  PM

## 2024-01-05 NOTE — Progress Notes (Unsigned)
 Ate food that was not properly cleaned.

## 2024-01-06 LAB — HCV INTERPRETATION

## 2024-01-06 LAB — CMP14+EGFR
ALT: 34 IU/L (ref 0–44)
AST: 31 IU/L (ref 0–40)
Albumin: 3.9 g/dL (ref 3.9–4.9)
Alkaline Phosphatase: 87 IU/L (ref 47–123)
BUN/Creatinine Ratio: 13 (ref 10–24)
BUN: 15 mg/dL (ref 8–27)
Bilirubin Total: 0.6 mg/dL (ref 0.0–1.2)
CO2: 25 mmol/L (ref 20–29)
Calcium: 9.5 mg/dL (ref 8.6–10.2)
Chloride: 104 mmol/L (ref 96–106)
Creatinine, Ser: 1.15 mg/dL (ref 0.76–1.27)
Globulin, Total: 3.1 g/dL (ref 1.5–4.5)
Glucose: 159 mg/dL — ABNORMAL HIGH (ref 70–99)
Potassium: 4.3 mmol/L (ref 3.5–5.2)
Sodium: 143 mmol/L (ref 134–144)
Total Protein: 7 g/dL (ref 6.0–8.5)
eGFR: 72 mL/min/1.73 (ref >59)

## 2024-01-06 LAB — HCV AB W REFLEX TO QUANT PCR: HCV Ab: NONREACTIVE

## 2024-01-06 LAB — SYPHILIS: RPR W/REFLEX TO RPR TITER AND TREPONEMAL ANTIBODIES, TRADITIONAL SCREENING AND DIAGNOSIS ALGORITHM: RPR Ser Ql: NONREACTIVE

## 2024-01-06 LAB — HIV ANTIBODY (ROUTINE TESTING W REFLEX): HIV Screen 4th Generation wRfx: NONREACTIVE

## 2024-01-10 ENCOUNTER — Ambulatory Visit: Payer: Self-pay | Admitting: Nurse Practitioner

## 2024-01-10 DIAGNOSIS — A599 Trichomoniasis, unspecified: Secondary | ICD-10-CM

## 2024-01-10 LAB — URINE CYTOLOGY ANCILLARY ONLY
Chlamydia: NEGATIVE
Comment: NEGATIVE
Comment: NEGATIVE
Comment: NORMAL
Neisseria Gonorrhea: NEGATIVE
Trichomonas: POSITIVE — AB

## 2024-01-10 MED ORDER — METRONIDAZOLE 500 MG PO TABS: 500.0000 mg | ORAL_TABLET | Freq: Two times a day (BID) | ORAL | 0 refills | Status: AC

## 2024-01-19 ENCOUNTER — Other Ambulatory Visit: Payer: Self-pay | Admitting: Family Medicine

## 2024-01-19 DIAGNOSIS — B351 Tinea unguium: Secondary | ICD-10-CM

## 2024-01-28 ENCOUNTER — Other Ambulatory Visit: Payer: Self-pay | Admitting: Nurse Practitioner

## 2024-01-28 DIAGNOSIS — M170 Bilateral primary osteoarthritis of knee: Secondary | ICD-10-CM

## 2024-02-11 ENCOUNTER — Ambulatory Visit (INDEPENDENT_AMBULATORY_CARE_PROVIDER_SITE_OTHER): Admitting: Medical

## 2024-02-11 ENCOUNTER — Encounter: Payer: Self-pay | Admitting: Medical

## 2024-02-11 ENCOUNTER — Other Ambulatory Visit (HOSPITAL_COMMUNITY)
Admission: RE | Admit: 2024-02-11 | Discharge: 2024-02-11 | Disposition: A | Source: Ambulatory Visit | Attending: Medical | Admitting: Medical

## 2024-02-11 VITALS — BP 118/70 | HR 99 | Temp 97.8°F | Resp 15 | Ht 74.0 in | Wt 398.8 lb

## 2024-02-11 DIAGNOSIS — J040 Acute laryngitis: Secondary | ICD-10-CM | POA: Diagnosis not present

## 2024-02-11 DIAGNOSIS — Z113 Encounter for screening for infections with a predominantly sexual mode of transmission: Secondary | ICD-10-CM | POA: Diagnosis present

## 2024-02-11 DIAGNOSIS — J029 Acute pharyngitis, unspecified: Secondary | ICD-10-CM

## 2024-02-11 DIAGNOSIS — R051 Acute cough: Secondary | ICD-10-CM

## 2024-02-11 LAB — POC COVID19/FLU A&B COMBO
Covid Antigen, POC: NEGATIVE
Influenza A Antigen, POC: NEGATIVE
Influenza B Antigen, POC: NEGATIVE

## 2024-02-11 LAB — POCT RAPID STREP A (OFFICE): Rapid Strep A Screen: POSITIVE — AB

## 2024-02-11 MED ORDER — AMOXICILLIN-POT CLAVULANATE 875-125 MG PO TABS: 1.0000 | ORAL_TABLET | Freq: Two times a day (BID) | ORAL | 0 refills | Status: AC

## 2024-02-11 MED ORDER — FLUTICASONE PROPIONATE 50 MCG/ACT NA SUSP: 2.0000 | Freq: Every day | NASAL | 1 refills | Status: AC

## 2024-02-11 MED ORDER — BENZONATATE 100 MG PO CAPS: 100.0000 mg | ORAL_CAPSULE | Freq: Three times a day (TID) | ORAL | 0 refills | Status: AC | PRN

## 2024-02-11 NOTE — Patient Instructions (Signed)
 Acute streptococcal pharyngitis with upper respiratory infection Acute streptococcal pharyngitis confirmed by positive strep test. COVID and flu tests negative. Augmentin  chosen due to productive cough. - Prescribed Augmentin  875 mg twice daily for 10 days. - Prescribed benzonatate  every 8 hours as needed for cough. - Prescribed Flonase  nasal spray, two sprays in each nostril once daily.  Screening for sexually transmitted infections Requested STD screening due to recent exposure. - Ordered urine test for gonorrhea, chlamydia, and trichomonas. - Ordered blood tests for HIV and RPR (syphilis). - Directed to lab for blood tests.  Follow up as regularly scheduled with pcp or next week if not responding to above treatment.

## 2024-02-11 NOTE — Progress Notes (Signed)
 "  Subjective:    Patient ID: Donald [name], male    DOB: [birth year], 62 y.o.   MRN: [id number]  HPI   Donald [name] is a 64 year old male who presents with throat issues and nasal congestion.  He has had st throat symptoms for three days, including hoarse voice, scratchy throat, odynophagia, cough, and rhinorrhea. Symptoms started after working in a cold, damp, moldy home environment while replacing his floor.   He has nasal congestion and sinus pressure with large-volume nasal mucus. The mucus  from nose was yellow yesterday and is clear today. He denies sinus tenderness.  He reports some pain eating because of throat symptoms. He was feeling well for about a month prior to this illness.  He also requests STD screening due to a recent sexual encounter and is concerned about possible exposure.        Review of Systems  Constitutional:  Negative for fatigue.  HENT:  Positive for congestion, sinus pressure and sore throat. Negative for sinus pain and trouble swallowing.        Hoarse voice  Respiratory:  Positive for cough. Negative for shortness of breath and wheezing.   Cardiovascular:  Negative for chest pain and palpitations.  Genitourinary:  Negative for dysuria, frequency and genital sores.  Musculoskeletal:  Negative for back pain and neck pain.  Neurological:  Negative for dizziness and light-headedness.  Hematological:  Negative for adenopathy.  Psychiatric/Behavioral:  Negative for behavioral problems and dysphoric mood.     Past Medical History:  Diagnosis Date   A-fib (HCC)    Anxiety    Arrhythmia    A fib    Arthritis    Atrial fibrillation (HCC)    CHF (congestive heart failure) (HCC)    Chronic kidney disease    COPD (chronic obstructive pulmonary disease) (HCC)    Diabetes mellitus without complication (HCC)    Hypertension    Myocardial infarction (HCC)    Stroke [facility])      Social History   Socioeconomic History   Marital status: Single     Spouse name: Not on file   Number of children: Not on file   Years of education: Not on file   Highest education level: Associate degree: academic program  Occupational History   Not on file  Tobacco Use   Smoking status: Former    Current packs/day: 0.00    Average packs/day: 0.5 packs/day for 15.0 years (7.5 ttl pk-yrs)    Types: Cigarettes    Start date: 2003    Quit date: 2018    Years since quitting: 8.0   Smokeless tobacco: Never  Vaping Use   Vaping status: Never Used  Substance and Sexual Activity   Alcohol use: Never   Drug use: Never    Types: Cocaine   Sexual activity: Yes    Birth control/protection: None  Other Topics Concern   Not on file  Social History Narrative   Not on file   Social Drivers of Health   Tobacco Use: Medium Risk (02/11/2024)   Patient History    Smoking Tobacco Use: Former    Smokeless Tobacco Use: Never    Passive Exposure: Not on file  Financial Resource Strain: High Risk (01/01/2024)   Overall Financial Resource Strain (CARDIA)    Difficulty of Paying Living Expenses: Very hard  Food Insecurity: Food Insecurity Present (01/01/2024)   Epic    Worried About Radiation Protection Practitioner of Food in the Last Year: Often true  Ran Out of Food in the Last Year: Sometimes true  Transportation Needs: Unknown (01/01/2024)   Epic    Lack of Transportation (Medical): No    Lack of Transportation (Non-Medical): Not on file  Physical Activity: Unknown (10/05/2023)   Exercise Vital Sign    Days of Exercise per Week: Patient declined    Minutes of Exercise per Session: Not on file  Stress: Patient Declined (10/05/2023)   [employer] of Occupational Health - Occupational Stress Questionnaire    Feeling of Stress: Patient declined  Social Connections: Moderately Isolated (01/01/2024)   Social Connection and Isolation Panel    Frequency of Communication with Friends and Family: Once a week    Frequency of Social Gatherings with Friends and Family: Once a week     Attends Religious Services: 1 to 4 times per year    Active Member of [employer] or Organizations: Yes    Attends Banker Meetings: 1 to 4 times per year    Marital Status: Divorced  Catering Manager Violence: Not on file  Depression (PHQ2-9): Low Risk (02/11/2024)   Depression (PHQ2-9)    PHQ-2 Score: 0  Alcohol Screen: Not on file  Housing: Patient Declined (01/01/2024)   Epic    Unable to Pay for Housing in the Last Year: Patient declined    Number of Times Moved in the Last Year: Not on file    Homeless in the Last Year: Patient declined  Recent Concern: Housing - High Risk (10/05/2023)   Epic    Unable to Pay for Housing in the Last Year: Yes    Number of Times Moved in the Last Year: 0    Homeless in the Last Year: Patient declined  Utilities: Not on file  Health Literacy: Not on file    Past Surgical History:  Procedure Laterality Date   ABLATION     CARDIOVERSION N/A 03/12/2020   Procedure: CARDIOVERSION;  Surgeon: Raford Riggs, MD;  Location: [facility] ENDOSCOPY;  Service: Cardiovascular;  Laterality: N/A;   CARDIOVERSION N/A 08/13/2020   Procedure: CARDIOVERSION;  Surgeon: Cherrie Toribio SAUNDERS, MD;  Location: [facility] ENDOSCOPY;  Service: Cardiovascular;  Laterality: N/A;   CARDIOVERSION N/A 09/02/2020   Procedure: CARDIOVERSION;  Surgeon: Loni Soyla LABOR, MD;  Location: [facility] ENDOSCOPY;  Service: Cardiovascular;  Laterality: N/A;   EYE SURGERY     INCISION AND DRAINAGE PERIRECTAL ABSCESS N/A 10/20/2012   Procedure: IRRIGATION AND DEBRIDEMENT PERIRECTAL ABSCESS;  Surgeon: Donnice POUR. Belinda, MD;  Location: [facility] OR;  Service: General;  Laterality: N/A;  Lithotomy   RIGHT/LEFT HEART CATH AND CORONARY ANGIOGRAPHY N/A 08/12/2020   Procedure: RIGHT/LEFT HEART CATH AND CORONARY ANGIOGRAPHY;  Surgeon: Cherrie Toribio SAUNDERS, MD;  Location: [facility] INVASIVE CV LAB;  Service: Cardiovascular;  Laterality: N/A;   TEE WITHOUT CARDIOVERSION N/A 03/12/2020   Procedure: TRANSESOPHAGEAL  ECHOCARDIOGRAM (TEE);  Surgeon: Raford Riggs, MD;  Location: [facility] ENDOSCOPY;  Service: Cardiovascular;  Laterality: N/A;   TEE WITHOUT CARDIOVERSION N/A 08/13/2020   Procedure: TRANSESOPHAGEAL ECHOCARDIOGRAM (TEE);  Surgeon: Cherrie Toribio SAUNDERS, MD;  Location: [facility] ENDOSCOPY;  Service: Cardiovascular;  Laterality: N/A;    Family History  Problem Relation Age of Onset   Breast cancer Mother    Heart attack Father 55   Esophageal cancer Maternal Aunt    Heart attack Paternal Uncle 9   Colon cancer Neg Hx    Rectal cancer Neg Hx    Stomach cancer Neg Hx     Allergies[1]  Medications Ordered Prior to Encounter[2]  BP 118/70   Pulse 99   Temp 97.8 F (36.6 C) (Oral)   Resp 15   Ht 6' 2 (1.88 m)   Wt (!) 398 lb 12.8 oz (180.9 kg)   SpO2 97%   BMI 51.20 kg/m        Objective:   Physical Exam  General- No acute distress. Pleasant patient. Neck- Full range of motion, no jvd Lungs- Clear, even and unlabored. Heart- regular rate and rhythm. Neurologic- CNII- XII grossly intact.  Heent- nasal congested. No sinus pressure. Ears- canals clear and normal. Mild red pharynx. No hypertrophy.      Assessment & Plan:   Patient Instructions  Acute streptococcal pharyngitis with upper respiratory infection Acute streptococcal pharyngitis confirmed by positive strep test. COVID and flu tests negative. Augmentin  chosen due to productive cough. - Prescribed Augmentin  875 mg twice daily for 10 days. - Prescribed benzonatate  every 8 hours as needed for cough. - Prescribed Flonase  nasal spray, two sprays in each nostril once daily.  Screening for sexually transmitted infections Requested STD screening due to recent exposure. - Ordered urine test for gonorrhea, chlamydia, and trichomonas. - Ordered blood tests for HIV and RPR (syphilis). - Directed to lab for blood tests.  Follow up as regularly scheduled with pcp or next week if not responding to above treatment.   Evaline Waltman, PA-C      [1]  Allergies Allergen Reactions   Lisinopril  Cough  [2]  Current Outpatient Medications on File Prior to Visit  Medication Sig Dispense Refill   Accu-Chek Softclix Lancets lancets Use as directed 100 each 0   albuterol  (PROVENTIL ) (2.5 MG/3ML) 0.083% nebulizer solution Take 3 mLs (2.5 mg total) by nebulization every 4 (four) hours as needed for wheezing or shortness of breath. 30 vial 0   albuterol  (VENTOLIN  HFA) 108 (90 Base) MCG/ACT inhaler Inhale 2 puffs into the lungs every 4 (four) hours as needed for shortness of breath. 18 g 1   albuterol  (VENTOLIN  HFA) 108 (90 Base) MCG/ACT inhaler Inhale 2 puffs into the lungs every 4 (four) hours as needed for wheezing or shortness of breath. 6.7 g 0   amiodarone  (PACERONE ) 200 MG tablet Take 1 tablet (200 mg total) by mouth 2 (two) times daily. 180 tablet 0   ammonium lactate  (AMLACTIN) 12 % lotion Apply 1 Application topically as needed for dry skin. 400 g 0   atorvastatin  (LIPITOR) 20 MG tablet Take 1 tablet (20 mg total) by mouth daily. 90 tablet 1   ciclopirox  (PENLAC ) 8 % solution APPLY TOPICALLY AT BEDTIME ONE DAILY. APPLY OVER NAIL AND SURROUNDING SKIN. AFTER SEVEN (7) DAYS, MAY REMOVE WITH ALCOHOL AND CONTINUE CYCLE. 6.6 mL 2   Continuous Glucose Receiver (FREESTYLE LIBRE 3 READER) DEVI Check blood glucose levels continuously Z79.84 1 each 0   Continuous Glucose Sensor (FREESTYLE LIBRE 3 PLUS SENSOR) MISC Change sensor every 15 days. Z79.84 2 each 6   digoxin  (LANOXIN ) 0.125 MG tablet TAKE 1/2 TABLET (0.125 MG TOTAL) BY MOUTH DAILY (AM) 15 tablet 0   ELIQUIS  5 MG TABS tablet TAKE 1 TABLET (5 MG TOTAL) BY MOUTH 2 (TWO) TIMES DAILY. (AM+PM) 180 tablet 0   FARXIGA  10 MG TABS tablet TAKE 1 TABLET (10 MG TOTAL) BY MOUTH DAILY. (AM) 90 tablet 1   metFORMIN  (GLUCOPHAGE ) 500 MG tablet TAKE ONE TABLET BY MOUTH TWICE DAILY WITH MEALS ( AM+PM) 180 tablet 3   metoprolol  succinate (TOPROL -XL) 100 MG 24 hr tablet Take 100 mg by  mouth daily.     mometasone -formoterol  (DULERA ) 100-5 MCG/ACT AERO Inhale 1 puff into the lungs daily.     sacubitril -valsartan  (ENTRESTO ) 49-51 MG TAKE ONE TABLET BY MOUTH TWICE DAILY (AM+PM) 180 tablet 1   Semaglutide , 1 MG/DOSE, 4 MG/3ML SOPN Inject 1 mg as directed once a week. 3 mL 2   spironolactone  (ALDACTONE ) 25 MG tablet Take 1 tablet (25 mg total) by mouth daily. 90 tablet 1   tadalafil  (CIALIS ) 20 MG tablet TAKE 1/2 TO 1 TABLETS (10-20 MG TOTAL) BY MOUTH EVERY OTHER DAY AS NEEDED FOR ERECTILE DYSFUNCTION. 10 tablet 11   torsemide  (DEMADEX ) 20 MG tablet TAKE 1 TABLET (20 MG TOTAL) BY MOUTH DAILY (AM) 30 tablet 0   traMADol  (ULTRAM ) 50 MG tablet TAKE 1 TABLET (50 MG TOTAL) BY MOUTH EVERY 8 (EIGHT) HOURS AS NEEDED. 30 tablet 0   traZODone  (DESYREL ) 100 MG tablet TAKE 1/2 TO 1 TABLET (50-100 MG TOTAL) BY MOUTH AT BEDTIME. FOR SLEEP 90 tablet 1   No current facility-administered medications on file prior to visit.   "

## 2024-02-13 ENCOUNTER — Ambulatory Visit: Payer: Self-pay | Admitting: Medical

## 2024-02-13 LAB — SYPHILIS: RPR W/REFLEX TO RPR TITER AND TREPONEMAL ANTIBODIES, TRADITIONAL SCREENING AND DIAGNOSIS ALGORITHM: RPR Ser Ql: NONREACTIVE

## 2024-02-13 LAB — HIV ANTIBODY (ROUTINE TESTING W REFLEX)
HIV 1&2 Ab, 4th Generation: NONREACTIVE
HIV FINAL INTERPRETATION: NEGATIVE

## 2024-02-15 LAB — URINE CYTOLOGY ANCILLARY ONLY
Chlamydia: NEGATIVE
Comment: NEGATIVE
Comment: NEGATIVE
Comment: NORMAL
Neisseria Gonorrhea: NEGATIVE
Trichomonas: NEGATIVE
